# Patient Record
Sex: Female | Born: 1960
Health system: Southern US, Community
[De-identification: ages and names within clinical notes are randomized; demographics above are authoritative.]

## PROBLEM LIST (undated history)

## (undated) DIAGNOSIS — F1910 Other psychoactive substance abuse, uncomplicated: Secondary | ICD-10-CM

## (undated) DIAGNOSIS — F32A Depression, unspecified: Secondary | ICD-10-CM

## (undated) DIAGNOSIS — I10 Essential (primary) hypertension: Secondary | ICD-10-CM

## (undated) DIAGNOSIS — F329 Major depressive disorder, single episode, unspecified: Secondary | ICD-10-CM

## (undated) DIAGNOSIS — F319 Bipolar disorder, unspecified: Secondary | ICD-10-CM

## (undated) DIAGNOSIS — E119 Type 2 diabetes mellitus without complications: Secondary | ICD-10-CM

## (undated) DIAGNOSIS — K759 Inflammatory liver disease, unspecified: Secondary | ICD-10-CM

## (undated) DIAGNOSIS — R7303 Prediabetes: Secondary | ICD-10-CM

## (undated) HISTORY — PX: CATARACT EXTRACTION: SUR2

---

## 1998-04-26 ENCOUNTER — Emergency Department (HOSPITAL_COMMUNITY): Admission: EM | Admit: 1998-04-26 | Discharge: 1998-04-26 | Payer: Self-pay | Admitting: Emergency Medicine

## 1998-09-18 ENCOUNTER — Encounter: Payer: Self-pay | Admitting: Emergency Medicine

## 1998-09-18 ENCOUNTER — Emergency Department (HOSPITAL_COMMUNITY): Admission: EM | Admit: 1998-09-18 | Discharge: 1998-09-18 | Payer: Self-pay | Admitting: Emergency Medicine

## 1999-04-21 ENCOUNTER — Emergency Department (HOSPITAL_COMMUNITY): Admission: EM | Admit: 1999-04-21 | Discharge: 1999-04-21 | Payer: Self-pay | Admitting: Emergency Medicine

## 1999-10-01 ENCOUNTER — Encounter: Payer: Self-pay | Admitting: Emergency Medicine

## 1999-10-01 ENCOUNTER — Emergency Department (HOSPITAL_COMMUNITY): Admission: EM | Admit: 1999-10-01 | Discharge: 1999-10-01 | Payer: Self-pay | Admitting: Emergency Medicine

## 1999-12-20 ENCOUNTER — Emergency Department (HOSPITAL_COMMUNITY): Admission: EM | Admit: 1999-12-20 | Discharge: 1999-12-20 | Payer: Self-pay | Admitting: Emergency Medicine

## 2000-04-11 ENCOUNTER — Emergency Department (HOSPITAL_COMMUNITY): Admission: EM | Admit: 2000-04-11 | Discharge: 2000-04-11 | Payer: Self-pay | Admitting: Emergency Medicine

## 2000-08-01 ENCOUNTER — Encounter: Payer: Self-pay | Admitting: Emergency Medicine

## 2000-08-01 ENCOUNTER — Emergency Department (HOSPITAL_COMMUNITY): Admission: EM | Admit: 2000-08-01 | Discharge: 2000-08-01 | Payer: Self-pay | Admitting: Emergency Medicine

## 2000-11-18 ENCOUNTER — Encounter: Payer: Self-pay | Admitting: Emergency Medicine

## 2000-11-18 ENCOUNTER — Emergency Department (HOSPITAL_COMMUNITY): Admission: EM | Admit: 2000-11-18 | Discharge: 2000-11-18 | Payer: Self-pay

## 2000-11-26 ENCOUNTER — Emergency Department (HOSPITAL_COMMUNITY): Admission: EM | Admit: 2000-11-26 | Discharge: 2000-11-26 | Payer: Self-pay | Admitting: Emergency Medicine

## 2000-12-05 ENCOUNTER — Emergency Department (HOSPITAL_COMMUNITY): Admission: EM | Admit: 2000-12-05 | Discharge: 2000-12-05 | Payer: Self-pay | Admitting: Emergency Medicine

## 2001-08-01 ENCOUNTER — Emergency Department (HOSPITAL_COMMUNITY): Admission: EM | Admit: 2001-08-01 | Discharge: 2001-08-01 | Payer: Self-pay

## 2002-01-02 ENCOUNTER — Emergency Department (HOSPITAL_COMMUNITY): Admission: EM | Admit: 2002-01-02 | Discharge: 2002-01-02 | Payer: Self-pay | Admitting: Emergency Medicine

## 2002-01-02 ENCOUNTER — Encounter: Payer: Self-pay | Admitting: Emergency Medicine

## 2002-02-21 ENCOUNTER — Emergency Department (HOSPITAL_COMMUNITY): Admission: EM | Admit: 2002-02-21 | Discharge: 2002-02-21 | Payer: Self-pay | Admitting: Emergency Medicine

## 2002-03-18 ENCOUNTER — Emergency Department (HOSPITAL_COMMUNITY): Admission: EM | Admit: 2002-03-18 | Discharge: 2002-03-18 | Payer: Self-pay | Admitting: Emergency Medicine

## 2002-06-26 ENCOUNTER — Emergency Department (HOSPITAL_COMMUNITY): Admission: EM | Admit: 2002-06-26 | Discharge: 2002-06-26 | Payer: Self-pay | Admitting: Emergency Medicine

## 2002-09-12 ENCOUNTER — Emergency Department (HOSPITAL_COMMUNITY): Admission: EM | Admit: 2002-09-12 | Discharge: 2002-09-12 | Payer: Self-pay | Admitting: Emergency Medicine

## 2002-09-13 ENCOUNTER — Inpatient Hospital Stay (HOSPITAL_COMMUNITY): Admission: EM | Admit: 2002-09-13 | Discharge: 2002-09-20 | Payer: Self-pay | Admitting: Psychiatry

## 2002-10-01 ENCOUNTER — Emergency Department (HOSPITAL_COMMUNITY): Admission: EM | Admit: 2002-10-01 | Discharge: 2002-10-01 | Payer: Self-pay | Admitting: Emergency Medicine

## 2002-10-01 ENCOUNTER — Encounter: Payer: Self-pay | Admitting: Emergency Medicine

## 2002-12-05 ENCOUNTER — Emergency Department (HOSPITAL_COMMUNITY): Admission: EM | Admit: 2002-12-05 | Discharge: 2002-12-05 | Payer: Self-pay | Admitting: Emergency Medicine

## 2002-12-05 ENCOUNTER — Encounter: Payer: Self-pay | Admitting: Emergency Medicine

## 2003-04-05 ENCOUNTER — Emergency Department (HOSPITAL_COMMUNITY): Admission: EM | Admit: 2003-04-05 | Discharge: 2003-04-05 | Payer: Self-pay | Admitting: Emergency Medicine

## 2003-04-05 ENCOUNTER — Encounter: Payer: Self-pay | Admitting: Emergency Medicine

## 2003-05-29 ENCOUNTER — Emergency Department (HOSPITAL_COMMUNITY): Admission: EM | Admit: 2003-05-29 | Discharge: 2003-05-29 | Payer: Self-pay | Admitting: Emergency Medicine

## 2003-07-05 ENCOUNTER — Emergency Department (HOSPITAL_COMMUNITY): Admission: EM | Admit: 2003-07-05 | Discharge: 2003-07-05 | Payer: Self-pay | Admitting: Emergency Medicine

## 2003-07-14 ENCOUNTER — Emergency Department (HOSPITAL_COMMUNITY): Admission: AD | Admit: 2003-07-14 | Discharge: 2003-07-14 | Payer: Self-pay | Admitting: Emergency Medicine

## 2003-08-02 ENCOUNTER — Encounter: Admission: RE | Admit: 2003-08-02 | Discharge: 2003-08-02 | Payer: Self-pay | Admitting: Internal Medicine

## 2003-08-19 ENCOUNTER — Encounter: Admission: RE | Admit: 2003-08-19 | Discharge: 2003-08-19 | Payer: Self-pay | Admitting: Internal Medicine

## 2003-09-14 ENCOUNTER — Encounter: Admission: RE | Admit: 2003-09-14 | Discharge: 2003-09-14 | Payer: Self-pay | Admitting: Psychiatry

## 2003-09-15 ENCOUNTER — Encounter: Admission: RE | Admit: 2003-09-15 | Discharge: 2003-09-15 | Payer: Self-pay | Admitting: Internal Medicine

## 2003-09-19 ENCOUNTER — Encounter: Admission: RE | Admit: 2003-09-19 | Discharge: 2003-09-19 | Payer: Self-pay | Admitting: Internal Medicine

## 2003-09-19 ENCOUNTER — Other Ambulatory Visit: Admission: RE | Admit: 2003-09-19 | Discharge: 2003-09-19 | Payer: Self-pay | Admitting: Internal Medicine

## 2003-09-26 ENCOUNTER — Encounter: Admission: RE | Admit: 2003-09-26 | Discharge: 2003-09-26 | Payer: Self-pay | Admitting: Internal Medicine

## 2003-10-20 ENCOUNTER — Encounter: Admission: RE | Admit: 2003-10-20 | Discharge: 2003-10-20 | Payer: Self-pay | Admitting: Internal Medicine

## 2003-11-10 ENCOUNTER — Encounter: Admission: RE | Admit: 2003-11-10 | Discharge: 2003-11-10 | Payer: Self-pay | Admitting: Internal Medicine

## 2003-11-13 ENCOUNTER — Emergency Department (HOSPITAL_COMMUNITY): Admission: AD | Admit: 2003-11-13 | Discharge: 2003-11-13 | Payer: Self-pay | Admitting: Family Medicine

## 2003-11-30 ENCOUNTER — Emergency Department (HOSPITAL_COMMUNITY): Admission: AD | Admit: 2003-11-30 | Discharge: 2003-11-30 | Payer: Self-pay | Admitting: Family Medicine

## 2003-12-15 ENCOUNTER — Encounter: Admission: RE | Admit: 2003-12-15 | Discharge: 2003-12-15 | Payer: Self-pay | Admitting: Internal Medicine

## 2003-12-24 ENCOUNTER — Emergency Department (HOSPITAL_COMMUNITY): Admission: AD | Admit: 2003-12-24 | Discharge: 2003-12-24 | Payer: Self-pay | Admitting: Family Medicine

## 2003-12-26 ENCOUNTER — Emergency Department (HOSPITAL_COMMUNITY): Admission: AD | Admit: 2003-12-26 | Discharge: 2003-12-26 | Payer: Self-pay | Admitting: Family Medicine

## 2005-04-05 ENCOUNTER — Ambulatory Visit (HOSPITAL_COMMUNITY): Admission: RE | Admit: 2005-04-05 | Discharge: 2005-04-05 | Payer: Self-pay | Admitting: Obstetrics

## 2005-04-10 ENCOUNTER — Inpatient Hospital Stay (HOSPITAL_COMMUNITY): Admission: RE | Admit: 2005-04-10 | Discharge: 2005-04-13 | Payer: Self-pay | Admitting: Obstetrics

## 2005-04-10 ENCOUNTER — Encounter (INDEPENDENT_AMBULATORY_CARE_PROVIDER_SITE_OTHER): Payer: Self-pay | Admitting: *Deleted

## 2005-05-22 ENCOUNTER — Emergency Department (HOSPITAL_COMMUNITY): Admission: EM | Admit: 2005-05-22 | Discharge: 2005-05-22 | Payer: Self-pay | Admitting: Emergency Medicine

## 2005-06-03 ENCOUNTER — Emergency Department (HOSPITAL_COMMUNITY): Admission: EM | Admit: 2005-06-03 | Discharge: 2005-06-03 | Payer: Self-pay | Admitting: Emergency Medicine

## 2005-07-26 ENCOUNTER — Emergency Department (HOSPITAL_COMMUNITY): Admission: EM | Admit: 2005-07-26 | Discharge: 2005-07-26 | Payer: Self-pay | Admitting: Family Medicine

## 2005-10-08 ENCOUNTER — Ambulatory Visit: Payer: Self-pay | Admitting: Internal Medicine

## 2006-01-08 ENCOUNTER — Ambulatory Visit: Payer: Self-pay | Admitting: Internal Medicine

## 2006-01-15 ENCOUNTER — Ambulatory Visit: Payer: Self-pay | Admitting: Internal Medicine

## 2006-02-04 ENCOUNTER — Ambulatory Visit: Payer: Self-pay | Admitting: Internal Medicine

## 2006-04-04 ENCOUNTER — Ambulatory Visit: Payer: Self-pay | Admitting: Internal Medicine

## 2006-04-09 ENCOUNTER — Encounter: Payer: Self-pay | Admitting: Emergency Medicine

## 2006-05-02 ENCOUNTER — Ambulatory Visit: Payer: Self-pay | Admitting: Internal Medicine

## 2006-06-04 ENCOUNTER — Encounter (INDEPENDENT_AMBULATORY_CARE_PROVIDER_SITE_OTHER): Payer: Self-pay | Admitting: Unknown Physician Specialty

## 2006-06-04 ENCOUNTER — Encounter: Admission: RE | Admit: 2006-06-04 | Discharge: 2006-06-04 | Payer: Self-pay | Admitting: Sports Medicine

## 2006-08-04 ENCOUNTER — Encounter (INDEPENDENT_AMBULATORY_CARE_PROVIDER_SITE_OTHER): Payer: Self-pay | Admitting: Internal Medicine

## 2006-08-06 ENCOUNTER — Encounter: Admission: RE | Admit: 2006-08-06 | Discharge: 2006-08-06 | Payer: Self-pay | Admitting: Internal Medicine

## 2006-09-16 ENCOUNTER — Ambulatory Visit: Payer: Self-pay | Admitting: Internal Medicine

## 2006-09-16 ENCOUNTER — Encounter (INDEPENDENT_AMBULATORY_CARE_PROVIDER_SITE_OTHER): Payer: Self-pay | Admitting: Internal Medicine

## 2006-10-03 DIAGNOSIS — I10 Essential (primary) hypertension: Secondary | ICD-10-CM | POA: Insufficient documentation

## 2006-10-03 DIAGNOSIS — L509 Urticaria, unspecified: Secondary | ICD-10-CM | POA: Insufficient documentation

## 2006-10-03 DIAGNOSIS — F319 Bipolar disorder, unspecified: Secondary | ICD-10-CM

## 2006-10-03 DIAGNOSIS — L309 Dermatitis, unspecified: Secondary | ICD-10-CM | POA: Insufficient documentation

## 2006-10-03 DIAGNOSIS — J42 Unspecified chronic bronchitis: Secondary | ICD-10-CM | POA: Insufficient documentation

## 2006-10-03 DIAGNOSIS — K5900 Constipation, unspecified: Secondary | ICD-10-CM

## 2006-10-03 DIAGNOSIS — M25519 Pain in unspecified shoulder: Secondary | ICD-10-CM

## 2006-10-25 ENCOUNTER — Emergency Department (HOSPITAL_COMMUNITY): Admission: EM | Admit: 2006-10-25 | Discharge: 2006-10-26 | Payer: Self-pay | Admitting: Emergency Medicine

## 2007-01-28 ENCOUNTER — Telehealth: Payer: Self-pay | Admitting: *Deleted

## 2007-02-02 ENCOUNTER — Ambulatory Visit: Payer: Self-pay | Admitting: Hospitalist

## 2007-02-02 ENCOUNTER — Encounter: Payer: Self-pay | Admitting: Licensed Clinical Social Worker

## 2007-02-02 ENCOUNTER — Encounter (INDEPENDENT_AMBULATORY_CARE_PROVIDER_SITE_OTHER): Payer: Self-pay | Admitting: *Deleted

## 2007-02-02 LAB — CONVERTED CEMR LAB
BUN: 17 mg/dL (ref 6–23)
CO2: 25 meq/L (ref 19–32)
Calcium: 9.5 mg/dL (ref 8.4–10.5)
Chloride: 107 meq/L (ref 96–112)
Creatinine, Ser: 0.85 mg/dL (ref 0.40–1.20)
Glucose, Bld: 94 mg/dL (ref 70–99)
Potassium: 4.3 meq/L (ref 3.5–5.3)
Sodium: 140 meq/L (ref 135–145)

## 2007-02-04 ENCOUNTER — Telehealth: Payer: Self-pay | Admitting: *Deleted

## 2007-02-17 ENCOUNTER — Encounter: Payer: Self-pay | Admitting: Licensed Clinical Social Worker

## 2007-04-24 ENCOUNTER — Telehealth (INDEPENDENT_AMBULATORY_CARE_PROVIDER_SITE_OTHER): Payer: Self-pay | Admitting: *Deleted

## 2007-05-12 ENCOUNTER — Encounter (INDEPENDENT_AMBULATORY_CARE_PROVIDER_SITE_OTHER): Payer: Self-pay | Admitting: Internal Medicine

## 2007-06-11 ENCOUNTER — Emergency Department (HOSPITAL_COMMUNITY): Admission: EM | Admit: 2007-06-11 | Discharge: 2007-06-11 | Payer: Self-pay | Admitting: Emergency Medicine

## 2007-06-24 ENCOUNTER — Ambulatory Visit: Payer: Self-pay | Admitting: Internal Medicine

## 2007-06-24 ENCOUNTER — Encounter (INDEPENDENT_AMBULATORY_CARE_PROVIDER_SITE_OTHER): Payer: Self-pay | Admitting: Internal Medicine

## 2007-06-24 DIAGNOSIS — H269 Unspecified cataract: Secondary | ICD-10-CM

## 2007-07-01 ENCOUNTER — Encounter: Admission: RE | Admit: 2007-07-01 | Discharge: 2007-07-01 | Payer: Self-pay | Admitting: Sports Medicine

## 2007-07-09 ENCOUNTER — Encounter (INDEPENDENT_AMBULATORY_CARE_PROVIDER_SITE_OTHER): Payer: Self-pay | Admitting: Internal Medicine

## 2007-07-09 ENCOUNTER — Encounter: Payer: Self-pay | Admitting: Licensed Clinical Social Worker

## 2007-07-09 ENCOUNTER — Ambulatory Visit: Payer: Self-pay | Admitting: *Deleted

## 2007-07-09 LAB — CONVERTED CEMR LAB
ALT: 8 units/L (ref 0–35)
AST: 14 units/L (ref 0–37)
Albumin: 4.6 g/dL (ref 3.5–5.2)
Alkaline Phosphatase: 61 units/L (ref 39–117)
BUN: 11 mg/dL (ref 6–23)
Basophils Absolute: 0 10*3/uL (ref 0.0–0.1)
Basophils Relative: 1 % (ref 0–1)
CO2: 27 meq/L (ref 19–32)
Calcium: 10.1 mg/dL (ref 8.4–10.5)
Chloride: 104 meq/L (ref 96–112)
Creatinine, Ser: 0.9 mg/dL (ref 0.40–1.20)
Eosinophils Absolute: 0.1 10*3/uL (ref 0.0–0.7)
Eosinophils Relative: 1 % (ref 0–5)
Glucose, Bld: 96 mg/dL (ref 70–99)
HCT: 39.2 % (ref 36.0–46.0)
Hemoglobin: 13 g/dL (ref 12.0–15.0)
Lymphocytes Relative: 39 % (ref 12–46)
Lymphs Abs: 2.4 10*3/uL (ref 0.7–3.3)
MCHC: 33.2 g/dL (ref 30.0–36.0)
MCV: 87.3 fL (ref 78.0–100.0)
Monocytes Absolute: 0.2 10*3/uL (ref 0.2–0.7)
Monocytes Relative: 4 % (ref 3–11)
Neutro Abs: 3.4 10*3/uL (ref 1.7–7.7)
Neutrophils Relative %: 56 % (ref 43–77)
Platelets: 260 10*3/uL (ref 150–400)
Potassium: 3.9 meq/L (ref 3.5–5.3)
RBC: 4.49 M/uL (ref 3.87–5.11)
RDW: 14.8 % — ABNORMAL HIGH (ref 11.5–14.0)
Sodium: 138 meq/L (ref 135–145)
Total Bilirubin: 0.5 mg/dL (ref 0.3–1.2)
Total Protein: 7.9 g/dL (ref 6.0–8.3)
WBC: 6.2 10*3/uL (ref 4.0–10.5)

## 2007-07-27 ENCOUNTER — Encounter: Payer: Self-pay | Admitting: Licensed Clinical Social Worker

## 2007-08-26 ENCOUNTER — Ambulatory Visit: Payer: Self-pay | Admitting: Internal Medicine

## 2007-08-26 ENCOUNTER — Encounter (INDEPENDENT_AMBULATORY_CARE_PROVIDER_SITE_OTHER): Payer: Self-pay | Admitting: Internal Medicine

## 2007-08-26 DIAGNOSIS — M6281 Muscle weakness (generalized): Secondary | ICD-10-CM

## 2007-08-27 LAB — CONVERTED CEMR LAB
BUN: 16 mg/dL (ref 6–23)
CO2: 22 meq/L (ref 19–32)
Calcium: 9.4 mg/dL (ref 8.4–10.5)
Chloride: 107 meq/L (ref 96–112)
Creatinine, Ser: 0.88 mg/dL (ref 0.40–1.20)
Glucose, Bld: 93 mg/dL (ref 70–99)
Magnesium: 2.1 mg/dL (ref 1.5–2.5)
Potassium: 3.8 meq/L (ref 3.5–5.3)
Sodium: 141 meq/L (ref 135–145)
TSH: 0.727 microintl units/mL (ref 0.350–5.50)

## 2007-09-17 ENCOUNTER — Ambulatory Visit: Payer: Self-pay | Admitting: *Deleted

## 2007-09-17 ENCOUNTER — Encounter (INDEPENDENT_AMBULATORY_CARE_PROVIDER_SITE_OTHER): Payer: Self-pay | Admitting: Infectious Diseases

## 2007-09-17 DIAGNOSIS — M25569 Pain in unspecified knee: Secondary | ICD-10-CM

## 2007-09-17 LAB — CONVERTED CEMR LAB
BUN: 13 mg/dL (ref 6–23)
CO2: 22 meq/L (ref 19–32)
Calcium: 10 mg/dL (ref 8.4–10.5)
Chloride: 105 meq/L (ref 96–112)
Creatinine, Ser: 0.9 mg/dL (ref 0.40–1.20)
Glucose, Bld: 93 mg/dL (ref 70–99)
Potassium: 4 meq/L (ref 3.5–5.3)
Rheumatoid fact SerPl-aCnc: <20 intl units/mL (ref 0–20)
Sodium: 139 meq/L (ref 135–145)
Total CK: 104 units/L (ref 7–177)

## 2007-09-18 ENCOUNTER — Telehealth: Payer: Self-pay | Admitting: *Deleted

## 2007-09-21 ENCOUNTER — Encounter (INDEPENDENT_AMBULATORY_CARE_PROVIDER_SITE_OTHER): Payer: Self-pay | Admitting: Internal Medicine

## 2007-09-28 ENCOUNTER — Ambulatory Visit (HOSPITAL_COMMUNITY): Admission: RE | Admit: 2007-09-28 | Discharge: 2007-09-28 | Payer: Self-pay | Admitting: Infectious Diseases

## 2007-09-28 ENCOUNTER — Encounter (INDEPENDENT_AMBULATORY_CARE_PROVIDER_SITE_OTHER): Payer: Self-pay | Admitting: Internal Medicine

## 2007-09-28 ENCOUNTER — Ambulatory Visit: Payer: Self-pay | Admitting: *Deleted

## 2007-10-07 ENCOUNTER — Encounter (INDEPENDENT_AMBULATORY_CARE_PROVIDER_SITE_OTHER): Payer: Self-pay | Admitting: Internal Medicine

## 2007-10-16 ENCOUNTER — Encounter: Payer: Self-pay | Admitting: Licensed Clinical Social Worker

## 2007-12-30 ENCOUNTER — Telehealth (INDEPENDENT_AMBULATORY_CARE_PROVIDER_SITE_OTHER): Payer: Self-pay | Admitting: Internal Medicine

## 2008-01-21 ENCOUNTER — Telehealth (INDEPENDENT_AMBULATORY_CARE_PROVIDER_SITE_OTHER): Payer: Self-pay | Admitting: Internal Medicine

## 2008-01-21 ENCOUNTER — Telehealth: Payer: Self-pay | Admitting: *Deleted

## 2008-01-21 ENCOUNTER — Encounter (INDEPENDENT_AMBULATORY_CARE_PROVIDER_SITE_OTHER): Payer: Self-pay | Admitting: Internal Medicine

## 2008-07-27 ENCOUNTER — Emergency Department (HOSPITAL_COMMUNITY): Admission: EM | Admit: 2008-07-27 | Discharge: 2008-07-27 | Payer: Self-pay | Admitting: Emergency Medicine

## 2008-08-29 ENCOUNTER — Emergency Department (HOSPITAL_COMMUNITY): Admission: EM | Admit: 2008-08-29 | Discharge: 2008-08-29 | Payer: Self-pay | Admitting: Emergency Medicine

## 2008-08-29 ENCOUNTER — Encounter (INDEPENDENT_AMBULATORY_CARE_PROVIDER_SITE_OTHER): Payer: Self-pay | Admitting: Internal Medicine

## 2008-08-29 ENCOUNTER — Telehealth (INDEPENDENT_AMBULATORY_CARE_PROVIDER_SITE_OTHER): Payer: Self-pay | Admitting: Internal Medicine

## 2008-08-29 ENCOUNTER — Ambulatory Visit: Payer: Self-pay | Admitting: Internal Medicine

## 2008-08-29 LAB — CONVERTED CEMR LAB

## 2008-09-01 ENCOUNTER — Ambulatory Visit: Payer: Self-pay | Admitting: Internal Medicine

## 2008-09-21 ENCOUNTER — Telehealth (INDEPENDENT_AMBULATORY_CARE_PROVIDER_SITE_OTHER): Payer: Self-pay | Admitting: Internal Medicine

## 2008-09-23 ENCOUNTER — Telehealth (INDEPENDENT_AMBULATORY_CARE_PROVIDER_SITE_OTHER): Payer: Self-pay | Admitting: Internal Medicine

## 2008-09-28 ENCOUNTER — Encounter (INDEPENDENT_AMBULATORY_CARE_PROVIDER_SITE_OTHER): Payer: Self-pay | Admitting: Internal Medicine

## 2008-09-28 ENCOUNTER — Ambulatory Visit: Payer: Self-pay | Admitting: Internal Medicine

## 2008-12-02 ENCOUNTER — Emergency Department (HOSPITAL_COMMUNITY): Admission: EM | Admit: 2008-12-02 | Discharge: 2008-12-02 | Payer: Self-pay | Admitting: Emergency Medicine

## 2009-01-03 ENCOUNTER — Encounter (INDEPENDENT_AMBULATORY_CARE_PROVIDER_SITE_OTHER): Payer: Self-pay | Admitting: Internal Medicine

## 2009-01-03 ENCOUNTER — Emergency Department (HOSPITAL_COMMUNITY): Admission: EM | Admit: 2009-01-03 | Discharge: 2009-01-03 | Payer: Self-pay | Admitting: Emergency Medicine

## 2009-03-31 ENCOUNTER — Encounter: Payer: Self-pay | Admitting: Internal Medicine

## 2009-07-19 ENCOUNTER — Emergency Department (HOSPITAL_COMMUNITY): Admission: EM | Admit: 2009-07-19 | Discharge: 2009-07-19 | Payer: Self-pay | Admitting: Emergency Medicine

## 2009-08-05 ENCOUNTER — Emergency Department (HOSPITAL_COMMUNITY): Admission: EM | Admit: 2009-08-05 | Discharge: 2009-08-05 | Payer: Self-pay | Admitting: Emergency Medicine

## 2009-10-10 ENCOUNTER — Telehealth: Payer: Self-pay | Admitting: Internal Medicine

## 2009-10-12 ENCOUNTER — Inpatient Hospital Stay (HOSPITAL_COMMUNITY): Admission: AD | Admit: 2009-10-12 | Discharge: 2009-10-16 | Payer: Self-pay | Admitting: Psychiatry

## 2009-10-12 ENCOUNTER — Other Ambulatory Visit: Payer: Self-pay | Admitting: Emergency Medicine

## 2009-10-12 ENCOUNTER — Ambulatory Visit: Payer: Self-pay | Admitting: Psychiatry

## 2009-12-24 ENCOUNTER — Emergency Department (HOSPITAL_COMMUNITY): Admission: EM | Admit: 2009-12-24 | Discharge: 2009-12-24 | Payer: Self-pay | Admitting: Emergency Medicine

## 2010-01-02 ENCOUNTER — Emergency Department (HOSPITAL_COMMUNITY): Admission: EM | Admit: 2010-01-02 | Discharge: 2010-01-02 | Payer: Self-pay | Admitting: Emergency Medicine

## 2010-02-03 ENCOUNTER — Emergency Department (HOSPITAL_COMMUNITY): Admission: EM | Admit: 2010-02-03 | Discharge: 2010-02-03 | Payer: Self-pay | Admitting: Emergency Medicine

## 2010-03-02 ENCOUNTER — Encounter: Payer: Self-pay | Admitting: Internal Medicine

## 2010-04-11 ENCOUNTER — Emergency Department (HOSPITAL_COMMUNITY): Admission: EM | Admit: 2010-04-11 | Discharge: 2010-04-11 | Payer: Self-pay | Admitting: Emergency Medicine

## 2010-09-29 ENCOUNTER — Emergency Department (HOSPITAL_COMMUNITY)
Admission: EM | Admit: 2010-09-29 | Discharge: 2010-09-29 | Payer: Self-pay | Source: Home / Self Care | Admitting: Emergency Medicine

## 2010-11-29 ENCOUNTER — Emergency Department (HOSPITAL_COMMUNITY)
Admission: EM | Admit: 2010-11-29 | Discharge: 2010-11-29 | Payer: Self-pay | Source: Home / Self Care | Admitting: Emergency Medicine

## 2011-01-08 NOTE — Letter (Signed)
Summary: MAMMOGRAM   MAMMOGRAM   Imported By: Margie Billet 03/06/2010 10:13:44  _____________________________________________________________________  External Attachment:    Type:   Image     Comment:   External Document  Appended Document: MAMMOGRAM     Clinical Lists Changes  Observations: Added new observation of MAMMO DUE: 07/2008 (03/06/2010 13:25) Added new observation of MAMMOGRAM: No specific mammographic evidence of malignancy.   (07/01/2007 13:26)       Mammogram  Procedure date:  07/01/2007  Findings:      No specific mammographic evidence of malignancy.    Procedures Next Due Date:    Mammogram: 07/2008   Mammogram  Procedure date:  07/01/2007  Findings:      No specific mammographic evidence of malignancy.    Procedures Next Due Date:    Mammogram: 07/2008

## 2011-01-31 ENCOUNTER — Emergency Department (HOSPITAL_COMMUNITY): Payer: Medicaid Other

## 2011-01-31 ENCOUNTER — Emergency Department (HOSPITAL_COMMUNITY)
Admission: EM | Admit: 2011-01-31 | Discharge: 2011-01-31 | Disposition: A | Discharge status: Home / Self Care | Payer: Medicaid Other | Attending: Emergency Medicine | Admitting: Emergency Medicine

## 2011-01-31 DIAGNOSIS — M25519 Pain in unspecified shoulder: Secondary | ICD-10-CM | POA: Insufficient documentation

## 2011-01-31 DIAGNOSIS — Z9889 Other specified postprocedural states: Secondary | ICD-10-CM | POA: Insufficient documentation

## 2011-01-31 DIAGNOSIS — F3130 Bipolar disorder, current episode depressed, mild or moderate severity, unspecified: Secondary | ICD-10-CM | POA: Insufficient documentation

## 2011-01-31 DIAGNOSIS — M129 Arthropathy, unspecified: Secondary | ICD-10-CM | POA: Insufficient documentation

## 2011-01-31 DIAGNOSIS — Z79899 Other long term (current) drug therapy: Secondary | ICD-10-CM | POA: Insufficient documentation

## 2011-01-31 DIAGNOSIS — I10 Essential (primary) hypertension: Secondary | ICD-10-CM | POA: Insufficient documentation

## 2011-01-31 DIAGNOSIS — Y929 Unspecified place or not applicable: Secondary | ICD-10-CM | POA: Insufficient documentation

## 2011-01-31 DIAGNOSIS — IMO0002 Reserved for concepts with insufficient information to code with codable children: Secondary | ICD-10-CM | POA: Insufficient documentation

## 2011-03-13 LAB — URINALYSIS, ROUTINE W REFLEX MICROSCOPIC
Hgb urine dipstick: NEGATIVE
Nitrite: NEGATIVE
Specific Gravity, Urine: 1.035 — ABNORMAL HIGH (ref 1.005–1.030)
pH: 5.5 (ref 5.0–8.0)

## 2011-03-13 LAB — CBC
Hemoglobin: 12.9 g/dL (ref 12.0–15.0)
RBC: 4.4 MIL/uL (ref 3.87–5.11)

## 2011-03-13 LAB — RAPID URINE DRUG SCREEN, HOSP PERFORMED
Opiates: NOT DETECTED
Tetrahydrocannabinol: NOT DETECTED

## 2011-03-13 LAB — DIFFERENTIAL
Lymphocytes Relative: 23 % (ref 12–46)
Monocytes Absolute: 0.6 10*3/uL (ref 0.1–1.0)
Monocytes Relative: 6 % (ref 3–12)
Neutro Abs: 6.8 10*3/uL (ref 1.7–7.7)

## 2011-03-13 LAB — BASIC METABOLIC PANEL
Calcium: 10.2 mg/dL (ref 8.4–10.5)
GFR calc Af Amer: >60 mL/min (ref >60)
GFR calc non Af Amer: 57 mL/min — ABNORMAL LOW (ref >60)
Sodium: 140 mEq/L (ref 135–145)

## 2011-03-13 LAB — RPR: RPR Ser Ql: NONREACTIVE

## 2011-03-13 LAB — PREGNANCY, URINE: Preg Test, Ur: NEGATIVE

## 2011-03-13 LAB — URINE MICROSCOPIC-ADD ON

## 2011-03-16 LAB — POTASSIUM: Potassium: 3.6 mEq/L (ref 3.5–5.1)

## 2011-03-16 LAB — RAPID URINE DRUG SCREEN, HOSP PERFORMED
Amphetamines: NOT DETECTED
Barbiturates: NOT DETECTED
Benzodiazepines: NOT DETECTED

## 2011-03-16 LAB — POCT I-STAT, CHEM 8
Chloride: 105 mEq/L (ref 96–112)
Creatinine, Ser: 0.9 mg/dL (ref 0.4–1.2)
Glucose, Bld: 100 mg/dL — ABNORMAL HIGH (ref 70–99)
HCT: 43 % (ref 36.0–46.0)
Potassium: 6.7 mEq/L (ref 3.5–5.1)
Sodium: 136 mEq/L (ref 135–145)

## 2011-03-16 LAB — POCT CARDIAC MARKERS
CKMB, poc: <1 ng/mL — ABNORMAL LOW (ref 1.0–8.0)
CKMB, poc: <1 ng/mL — ABNORMAL LOW (ref 1.0–8.0)
Myoglobin, poc: 51.1 ng/mL (ref 12–200)
Myoglobin, poc: 52.9 ng/mL (ref 12–200)

## 2011-03-16 LAB — ETHANOL: Alcohol, Ethyl (B): <5 mg/dL (ref 0–10)

## 2011-03-17 LAB — POCT I-STAT, CHEM 8
Creatinine, Ser: 0.8 mg/dL (ref 0.4–1.2)
Glucose, Bld: 88 mg/dL (ref 70–99)
HCT: 40 % (ref 36.0–46.0)
Hemoglobin: 13.6 g/dL (ref 12.0–15.0)
Potassium: 3.7 mEq/L (ref 3.5–5.1)
TCO2: 26 mmol/L (ref 0–100)

## 2011-03-17 LAB — POCT CARDIAC MARKERS: CKMB, poc: <1 ng/mL — ABNORMAL LOW (ref 1.0–8.0)

## 2011-04-26 NOTE — H&P (Signed)
NAME:  Rhonda Dominguez, Rhonda Dominguez                       ACCOUNT NO.:  [id number]   MEDICAL RECORD NO.:  [id number]                   PATIENT TYPE:  IPS   LOCATION:  0504                                 [facility]ACILITY[facility]  [facility]   PHYSICIAN:  Geoffery Lyons, M.D.                   DATE OF BIRTH:  May 19, 1961   DATE OF ADMISSION:  09/13/2002  DATE OF DISCHARGE:                         PSYCHIATRIC ADMISSION ASSESSMENT   IDENTIFYING INFORMATION:  This is a 50 year old single African-American  female voluntarily admitted on September 13, 2002.   HISTORY OF PRESENT ILLNESS:  The patient presents with history of depression  and suicidal thoughts.  The patient states she tried to stab herself but she  was stopped by her sister.  She feels very depressed and has been for the  past two weeks.  She would not disclose over what.  Reports positive  auditory hallucinations, hearing bad things.  Positive visual  hallucinations, seeing shadows.  Her sleep has been decreased.  She had a  decreased appetite with a 10-pound weight loss.  Denies any paranoia.  She  reported that she wants to get started on Ritalin, that she has been on in  the past.  She states she has had a problem with drug abuse but has been off  cocaine for the two months.  The patient denies any consistent alcohol use.   PAST PSYCHIATRIC HISTORY:  First hospitalization to [facility].  No other inpatient admissions.  No outpatient treatment.  No  history of a suicide attempt.   SOCIAL HISTORY:  This is a 50 year old single African-American female with  two children, ages 29 and 58.  She lives with her mother.  She is applying  for disability.  No legal problems.  She has completed the 11th grade.   FAMILY HISTORY:  None.   ALCOHOL/DRUG HISTORY:  The patient smokes.  She drinks beer, mostly by the  quart per week.  She states she has been off any drugs for the past two  months and has been using cocaine in the past.   PRIMARY CARE  PHYSICIAN:  None.   MEDICAL PROBLEMS:  Chronic bronchitis and vaginal bleeding.   MEDICATIONS:  None.   ALLERGIES:  None.   PHYSICAL EXAMINATION:  Performed at [facility] Emergency Department.   LABORATORY DATA:  Urine pregnancy test was negative.  CBC with hemoglobin  10.9, hematocrit 33.5, RDW elevated at 14.9, albumin 3.2.   MENTAL STATUS EXAM:  She is an alert, young, middle-aged African-American  female.  She has poor eye contact.  Wearing hospital-wear.  Speech is clear.  Mood is anxious.  Affect is irritable.  Thought processes are coherent.  The  patient was endorsing positive auditory and visual hallucinations.  Does not  appear to be responding to internal stimuli at this time.  Cognitive  function intact.  Memory is fair.  Judgment and insight are fair.  DIAGNOSES:   AXIS I:  1. Major depressive disorder with psychosis.  2. Cocaine abuse.   AXIS II:  Deferred.   AXIS III:  1. Chronic bronchitis.  2. Trichomonas.  3. Pelvic inflammatory disease.  4. Anemia.   AXIS IV:  Problems with social environment, other psychosocial problems,  medical problems.   AXIS V:  Current 30; this past year 58.   PLAN:  Voluntary admission for suicidal ideation and psychosis.  Contract  for safety.  Check every 15 minutes.  Will initiate an antidepressant to  stabilize mood and thinking so patient and others can be safe.  Will  initiate Zyprexa for psychosis.  Follow up with mental health.  The patient  to be medication-compliant.  Medication compliance was discussed with  patient.  The patient to remain alcohol and drug-free.   TENTATIVE LENGTH OF STAY:  Three to five days.      Landry Corporal, N.P.                       Geoffery Lyons, M.D.    JO/MEDQ  D:  09/15/2002  T:  09/15/2002  Job:  [id number]

## 2011-04-26 NOTE — Op Note (Signed)
[name], Rhonda Dominguez             ACCOUNT NO.:  [id number]   MEDICAL RECORD NO.:  [id number]          PATIENT TYPE:  INP   LOCATION:  9399                          FACILITY:  [facility]   PHYSICIAN:  Kathreen Cosier, M.D.DATE OF BIRTH:  1961-02-15   DATE OF PROCEDURE:  04/10/2005  DATE OF DISCHARGE:                                 OPERATIVE REPORT   PREOPERATIVE DIAGNOSIS:  Severe dysmenorrhea and dysfunctional uterine  bleeding.   POSTOPERATIVE DIAGNOSIS:  Severe dysmenorrhea and dysfunctional uterine  bleeding.   OPERATION PERFORMED:  Total abdominal hysterectomy, bilateral salpingectomy.   SURGEON:  Kathreen Cosier, M.D.   ASSISTANT:  Bing Neighbors. Clearance Coots, M.D.   ANESTHESIA:  General.   DESCRIPTION OF PROCEDURE:  Patient placed in supine position.  General  anesthesia administered.  Abdomen prepped and draped and bladder emptied  with a Foley catheter.  Transverse suprapubic incision made, carried down to  the rectus fascia.  Fascia cleaned and incised the length of the incision.  Rectus muscles were retracted laterally.  Peritoneum was incised  longitudinally.  Uterus was enlarged and boggy.  The right round ligament  was grasped with a Kelly clamp, cut and suture ligated with #1 chromic.  Procedure down in similar fashion to the other side.  Using Metzenbaum  scissors, a bladder flap was developed and the bladder dissected off of the  cervix.  The right utero-ovarian ligament was clamped, cut and suture  ligated with #1 chromic bilaterally.  Then the uterine vessels skeletonized  bilaterally, double clamped with Heaney clamp on the right, cut, suture  ligated with #1 chromic.  Procedure done in similar fashion the other side.  The uterosacral ligament and cardinal ligaments grasped with straight Kocher  clamps, suture ligated with #1 chromic bilaterally. Specimens __________  uterus and tubes removed at cervicovaginal junction with Mayo scissors.  Modified Richardson  sutures placed in the angles of the vagina.  Then the  vaginal walls run with interlocking sutures of #1 chromic.  Hemostasis was  satisfactory.  Blood loss 200 cc.  Operative site reperitonealized with 2-0  chromic.  Lap and sponge counts correct.  Abdomen closed in layers.  Peritoneum with continuous suture of 0 chromic.  Fascia continuous suture of  0 Dexon and the skin closed with subcuticular stitch of 4-0 Monocryl.  The  patient tolerated the procedure well, taken to recovery room in good  condition.      BAM/MEDQ  D:  04/10/2005  T:  04/10/2005  Job:  [id number]

## 2011-04-26 NOTE — Discharge Summary (Signed)
NAME:  MADASON, RAULS                       ACCOUNT NO.:  [id number]   MEDICAL RECORD NO.:  [id number]                   PATIENT TYPE:  IPS   LOCATION:  0504                                 [facility]ILITY[facility]  [facility]   PHYSICIAN:  Geoffery Lyons, M.D.                   DATE OF BIRTH:  01-31-1961   DATE OF ADMISSION:  09/13/2002  DATE OF DISCHARGE:  09/20/2002                                 DISCHARGE SUMMARY   CHIEF COMPLAINT AND PRESENTING ILLNESS:  This was first admission  to [facility]  for this 50 year old single African-American female,  voluntarily admitted.  History of depression and suicidal thoughts.  She  tried to stab herself but she was stopped by her sister.  Feels very  depressed, has been for the past 2 weeks, she could not disclose however  why.  Reports positive auditory hallucinations, hearing bad things, reports  visual hallucinations seeing shadows.  Decreased sleep, decreased appetite,  weight loss.  She wants to be started on Ritalin that she has been on in the  past.  Problem with drug abuse.  Has been off cocaine for 2 months.   PAST PSYCHIATRIC HISTORY:  First time [facility], no other  inpatient treatment.   ALCOHOL AND DRUG HISTORY:  Smokes, drinks beer, off drugs for the past 2  months, has used cocaine in the past.   PAST MEDICAL HISTORY:  Chronic bronchitis, vaginal bleeding.   PHYSICAL EXAMINATION:  Performed, failed to show any acute findings.   MENTAL STATUS EXAM:  Reveals an alert, young middle-aged, African-American  female, poor eye contact, wearing hospital wear.  Speech is clear, mood is  anxious, affect is irritable.  Thought processes are coherent, reports  positive auditory and visual hallucinations, does not appear to be  responding to internal stimuli.  Cognition well preserved.   ADMISSION DIAGNOSES:   AXIS I:  1. Major depression, with psychotic features.  2. Cocaine abuse.   AXIS II:  No diagnosis.   AXIS III:  Chronic bronchitis, Trichomonas, __________ anemia.   AXIS IV:  Moderate.   AXIS V:  Global assessment of function upon admission 30, highest global  assessment of function in past year 70.   LABORATORY DATA:  CBC:  Hemoglobin 10.9.  Blood chemistries were within  normal limits.  Thyroid profile was within normal limits.  Drug screen  positive for cocaine.   COURSE IN HOSPITAL:  She was admitted and started on intensive individual  and group psychotherapy.  We went ahead and started Zyprexa 5 at bedtime.  She was given Zoloft 50 mg per day.  Zyprexa was increased to 2.5 in the  morning and then 7.5 at night, and then 2.5 in the morning and 10 at night.  Zoloft was increased to 75.  Zyprexa was increased to 2.5 in the morning and  15 at night, and Zoloft was decreased back  to 50.  As the hospitalization  progressed, initially not sleeping well, very sensitive, guarded in terms of  what was going on.  Irritable, reluctant to leave the bed, feeling that she  is not sleeping well, continuing to be guarded and hostile. Slowly started  improving, started sleeping better, unable to discuss the issues, denied any  cravings, no problems with perception.  By October 13 she seemed to be back  to her baseline, was __________ but says that is the way she is.  Denied any  suicidal ideas, no homicidal ideas, willing and motivated to pursue further  outpatient treatment, increased insight with the need to abstain.  Will be  going to [facility].   DISCHARGE DIAGNOSES:   AXIS I:  1. Major depression with psychosis.  2. Cocaine dependence.   AXIS II:  No diagnosis.   AXIS III:  Chronic bronchitis, anemia, trichomonas, pelvic inflammatory  disease.   AXIS IV:  Moderate.   AXIS V:  Global assessment of function upon discharge 55.   DISCHARGE MEDICATIONS:  1. Vibramycin 100 twice a day.  2. Symmetrel 100 twice a day.  3. Zyprexa 2.5 in the morning, 15 at night.  4. Depakote ER  500 at bedtime.  5. Zoloft 25 mg daily.  6. Ambien 10 at bedtime for sleep.   DISPOSITION:                                                Geoffery Lyons, M.D.    IL/MEDQ  D:  10/20/2002  T:  10/22/2002  Job:  [id number]

## 2011-04-26 NOTE — Discharge Summary (Signed)
[name], [name]             ACCOUNT NO.:  [id number]   MEDICAL RECORD NO.:  [id number]          PATIENT TYPE:  INP   LOCATION:  9318                          FACILITY:  [facility]   PHYSICIAN:  Kathreen Cosier, M.D.DATE OF BIRTH:  1961/09/15   DATE OF ADMISSION:  04/10/2005  DATE OF DISCHARGE:  04/13/2005                                 DISCHARGE SUMMARY   The patient is a 50 year old gravida 2, para 2-0-2 with 2 year history of  heavy vaginal bleeding, severe dysmenorrhea.  The patient was admitted for  definitive therapy.   On admission, her sodium 141, potassium 3.9, chloride 101, CO2 28, glucose  89, BUN 10, creatinine 1, PT/PTT normal.  Urinalysis was negative.  Hemoglobin was 12.7, white count 8.6, platelets 331.  She underwent TAH and  bilateral salpingectomy on Apr 10, 2005.  Her hemoglobin postoperatively was  9.6.  The patient did well and was discharged home on the third  postoperative day, ambulatory on a regular diet, Tylox for pain, to see me  in 4 weeks.   DISCHARGE DIAGNOSIS:  Status post total abdominal hysterectomy and bilateral  salpingectomy.      BAM/MEDQ  D:  04/13/2005  T:  04/13/2005  Job:  [id number]

## 2011-05-21 ENCOUNTER — Emergency Department (HOSPITAL_COMMUNITY)
Admission: EM | Admit: 2011-05-21 | Discharge: 2011-05-21 | Disposition: A | Discharge status: Home / Self Care | Payer: Medicaid Other | Attending: Emergency Medicine | Admitting: Emergency Medicine

## 2011-05-21 DIAGNOSIS — F17200 Nicotine dependence, unspecified, uncomplicated: Secondary | ICD-10-CM | POA: Insufficient documentation

## 2011-05-21 DIAGNOSIS — I10 Essential (primary) hypertension: Secondary | ICD-10-CM | POA: Insufficient documentation

## 2011-05-21 DIAGNOSIS — M129 Arthropathy, unspecified: Secondary | ICD-10-CM | POA: Insufficient documentation

## 2011-05-21 DIAGNOSIS — F1410 Cocaine abuse, uncomplicated: Secondary | ICD-10-CM | POA: Insufficient documentation

## 2011-05-21 DIAGNOSIS — F3289 Other specified depressive episodes: Secondary | ICD-10-CM | POA: Insufficient documentation

## 2011-05-21 DIAGNOSIS — F329 Major depressive disorder, single episode, unspecified: Secondary | ICD-10-CM | POA: Insufficient documentation

## 2011-05-21 DIAGNOSIS — H409 Unspecified glaucoma: Secondary | ICD-10-CM | POA: Insufficient documentation

## 2011-05-21 DIAGNOSIS — Z0289 Encounter for other administrative examinations: Secondary | ICD-10-CM | POA: Insufficient documentation

## 2011-05-21 LAB — URINALYSIS, ROUTINE W REFLEX MICROSCOPIC
Bilirubin Urine: NEGATIVE
Glucose, UA: NEGATIVE mg/dL
Hgb urine dipstick: NEGATIVE
Ketones, ur: NEGATIVE mg/dL
Protein, ur: NEGATIVE mg/dL
Urobilinogen, UA: 1 mg/dL (ref 0.0–1.0)

## 2011-05-21 LAB — COMPREHENSIVE METABOLIC PANEL
ALT: 16 U/L (ref 0–35)
Alkaline Phosphatase: 96 U/L (ref 39–117)
BUN: 17 mg/dL (ref 6–23)
CO2: 23 mEq/L (ref 19–32)
Calcium: 9.6 mg/dL (ref 8.4–10.5)
GFR calc Af Amer: >60 mL/min (ref >60)
GFR calc non Af Amer: >60 mL/min (ref >60)
Glucose, Bld: 104 mg/dL — ABNORMAL HIGH (ref 70–99)
Sodium: 138 mEq/L (ref 135–145)
Total Protein: 7 g/dL (ref 6.0–8.3)

## 2011-05-21 LAB — CBC
HCT: 36.9 % (ref 36.0–46.0)
Hemoglobin: 11.9 g/dL — ABNORMAL LOW (ref 12.0–15.0)
MCH: 27.2 pg (ref 26.0–34.0)
MCHC: 32.2 g/dL (ref 30.0–36.0)
MCV: 84.2 fL (ref 78.0–100.0)
RDW: 15.8 % — ABNORMAL HIGH (ref 11.5–15.5)

## 2011-05-21 LAB — DIFFERENTIAL
Basophils Absolute: 0 10*3/uL (ref 0.0–0.1)
Eosinophils Relative: 2 % (ref 0–5)
Lymphocytes Relative: 49 % — ABNORMAL HIGH (ref 12–46)
Lymphs Abs: 3.4 10*3/uL (ref 0.7–4.0)
Monocytes Absolute: 0.4 10*3/uL (ref 0.1–1.0)
Monocytes Relative: 6 % (ref 3–12)
Neutro Abs: 2.9 10*3/uL (ref 1.7–7.7)

## 2011-05-21 LAB — ETHANOL: Alcohol, Ethyl (B): <11 mg/dL — ABNORMAL HIGH (ref 0–10)

## 2011-05-21 LAB — RAPID URINE DRUG SCREEN, HOSP PERFORMED
Barbiturates: NOT DETECTED
Cocaine: POSITIVE — AB

## 2011-05-22 ENCOUNTER — Inpatient Hospital Stay: Payer: Self-pay | Admitting: Psychiatry

## 2011-09-24 LAB — URINALYSIS, ROUTINE W REFLEX MICROSCOPIC
Glucose, UA: NEGATIVE
Nitrite: NEGATIVE
Specific Gravity, Urine: 1.01
pH: 7

## 2011-09-24 LAB — RAPID URINE DRUG SCREEN, HOSP PERFORMED
Amphetamines: NOT DETECTED
Benzodiazepines: NOT DETECTED
Tetrahydrocannabinol: NOT DETECTED

## 2011-09-24 LAB — URINE MICROSCOPIC-ADD ON

## 2011-09-24 LAB — I-STAT 8, (EC8 V) (CONVERTED LAB)
Acid-Base Excess: 1
Bicarbonate: 24.7 — ABNORMAL HIGH
TCO2: 26
pCO2, Ven: 35.6 — ABNORMAL LOW
pH, Ven: 7.449 — ABNORMAL HIGH

## 2011-09-24 LAB — POCT I-STAT CREATININE
Creatinine, Ser: 0.9
Operator id: 277751

## 2011-10-30 ENCOUNTER — Emergency Department (HOSPITAL_COMMUNITY)
Admission: EM | Admit: 2011-10-30 | Discharge: 2011-10-30 | Disposition: A | Discharge status: Home / Self Care | Payer: Medicaid Other | Attending: Emergency Medicine | Admitting: Emergency Medicine

## 2011-10-30 ENCOUNTER — Other Ambulatory Visit: Payer: Self-pay

## 2011-10-30 DIAGNOSIS — M25519 Pain in unspecified shoulder: Secondary | ICD-10-CM | POA: Insufficient documentation

## 2011-10-30 DIAGNOSIS — Z79899 Other long term (current) drug therapy: Secondary | ICD-10-CM | POA: Insufficient documentation

## 2011-10-30 DIAGNOSIS — F17200 Nicotine dependence, unspecified, uncomplicated: Secondary | ICD-10-CM | POA: Insufficient documentation

## 2011-10-30 DIAGNOSIS — F3289 Other specified depressive episodes: Secondary | ICD-10-CM | POA: Insufficient documentation

## 2011-10-30 DIAGNOSIS — I10 Essential (primary) hypertension: Secondary | ICD-10-CM | POA: Insufficient documentation

## 2011-10-30 DIAGNOSIS — IMO0001 Reserved for inherently not codable concepts without codable children: Secondary | ICD-10-CM | POA: Insufficient documentation

## 2011-10-30 DIAGNOSIS — F329 Major depressive disorder, single episode, unspecified: Secondary | ICD-10-CM | POA: Insufficient documentation

## 2011-10-30 DIAGNOSIS — M6281 Muscle weakness (generalized): Secondary | ICD-10-CM | POA: Insufficient documentation

## 2011-10-30 DIAGNOSIS — M79609 Pain in unspecified limb: Secondary | ICD-10-CM | POA: Insufficient documentation

## 2011-10-30 HISTORY — DX: Essential (primary) hypertension: I10

## 2011-10-30 HISTORY — DX: Major depressive disorder, single episode, unspecified: F32.9

## 2011-10-30 HISTORY — DX: Depression, unspecified: F32.A

## 2011-10-30 MED ORDER — IBUPROFEN 200 MG PO TABS
600.0000 mg | ORAL_TABLET | Freq: Once | ORAL | Status: AC
  Administered 2011-10-30: 600 mg via ORAL
  Filled 2011-10-30: qty 1

## 2011-10-30 MED ORDER — TRAMADOL HCL 50 MG PO TABS: 50.0000 mg | ORAL_TABLET | Freq: Four times a day (QID) | ORAL | Status: AC | PRN

## 2011-10-30 MED ORDER — CYCLOBENZAPRINE HCL 10 MG PO TABS: 10.0000 mg | ORAL_TABLET | Freq: Two times a day (BID) | ORAL | Status: AC | PRN

## 2011-10-30 MED ORDER — IBUPROFEN 600 MG PO TABS: 600.0000 mg | ORAL_TABLET | Freq: Three times a day (TID) | ORAL | Status: AC

## 2011-10-30 NOTE — ED Provider Notes (Signed)
History     CSN: [id number] Arrival date & time: 10/30/2011  2:00 PM   First MD Initiated Contact with Patient 10/30/11 1653      Chief Complaint  Patient presents with  . Shoulder Pain    (Consider location/radiation/quality/duration/timing/severity/associated sxs/prior treatment) Patient is a 50 y.o. female presenting with shoulder pain. The history is provided by the patient.  Shoulder Pain This is a new problem. The current episode started in the past 7 days. The problem occurs constantly. The problem has been unchanged. Associated symptoms include myalgias (left arm). Pertinent negatives include no abdominal pain, chest pain, coughing, fever, headaches, nausea, neck pain or numbness. Exacerbated by: movement. She has tried nothing for the symptoms.    Past Medical History  Diagnosis Date  . Depression   . Hypertension   . Pneumothorax     History reviewed. No pertinent past surgical history.  History reviewed. No pertinent family history.  History  Substance Use Topics  . Smoking status: Current Everyday Smoker -- 0.5 packs/day  . Smokeless tobacco: Not on file  . Alcohol Use: Yes    OB History    Grav Para Term Preterm Abortions TAB SAB Ect Mult Living                  Review of Systems  Constitutional: Negative for fever.  HENT: Negative for neck pain.   Respiratory: Negative for cough and shortness of breath.   Cardiovascular: Negative for chest pain.  Gastrointestinal: Negative for nausea, abdominal pain and diarrhea.  Musculoskeletal: Positive for myalgias (left arm). Negative for back pain.  Neurological: Negative for numbness and headaches.  All other systems reviewed and are negative.    Allergies  Review of patient's allergies indicates no known allergies.  Home Medications   Current Outpatient Rx  Name Route Sig Dispense Refill  . LISINOPRIL 20 MG PO TABS Oral Take 20 mg by mouth daily.      Marland Kitchen OLANZAPINE 5 MG PO TABS Oral Take 5 mg by  mouth at bedtime.      . SERTRALINE HCL 100 MG PO TABS Oral Take 100 mg by mouth daily.      . TRAZODONE HCL 100 MG PO TABS Oral Take 200 mg by mouth at bedtime.        BP 142/100  Pulse 83  Temp(Src) 98.3 F (36.8 C) (Oral)  Resp 22  Ht 5\' 7"  (1.702 m)  Wt 158 lb (71.668 kg)  BMI 24.75 kg/m2  SpO2 99%  Physical Exam  Nursing note and vitals reviewed. Constitutional: She is oriented to person, place, and time. She appears well-developed and well-nourished. No distress.  HENT:  Head: Normocephalic and atraumatic.  Eyes: EOM are normal. Pupils are equal, round, and reactive to light.  Neck: Normal range of motion.  Cardiovascular: Normal rate, regular rhythm and normal heart sounds.   Pulmonary/Chest: Effort normal and breath sounds normal. No respiratory distress.  Abdominal: Soft. She exhibits no distension. There is no tenderness.  Musculoskeletal: Normal range of motion. She exhibits tenderness (left scapula, left arm and forearm).       Arms: Neurological: She is alert and oriented to person, place, and time.  Skin: Skin is warm and dry.    ED Course  Procedures (including critical care time)  Labs Reviewed - No data to display No results found.  Date: 10/30/2011  Rate: 74  Rhythm: normal sinus rhythm  QRS Axis: normal  Intervals: normal  ST/T Wave abnormalities: normal  Conduction Disutrbances:none  Narrative Interpretation:   Old EKG Reviewed: unchanged    1. Shoulder pain       MDM  5:02 PM Pt seen and examined. Pt with three days of left shoulder pain down her left arm. Movement and sensation are intact. Pt requesting ice pack, which will be provided. She has not tried any pain meds. Will give motrin here. Doubt ACS as EKG unchanged and patient without any chest pain or shortness of breath. Will discharge with motrin, ultram, and flexeril. Will also place pt in left arm sling for comfort until she is feeling better. Pt with full ROM so do not feel that  imaging is needed at this time.         Daleen Bo 10/30/11 2354

## 2011-10-30 NOTE — ED Notes (Signed)
Upon attempting to assess pt, she loudly began to state "why is the pt beside me being sen first, I was here before her."  Informed her that patients were seen by level of acuity and that the doctor would be to see her shortly.  She again raised her voice at which point I informed her that the behavior was inappropriate.

## 2011-10-30 NOTE — Progress Notes (Signed)
Orthopedic Tech Progress Note Patient Details:  Rhonda Dominguez 02-18-1961 [id number]  Other Ortho Devices Ortho Device Location: applied arm sling to (L) UE Ortho Device Interventions: Application   [name] 10/30/2011, 5:58 PM

## 2011-10-30 NOTE — ED Notes (Signed)
Pt medicated per order and provided with ice pack.

## 2011-10-30 NOTE — Progress Notes (Signed)
50 year old female complains of pain in her left upper arm radiating down to her hand and up into her shoulder. This is been present for 3 days. Is worse with any movement. On exam, she has marked tenderness to palpation anywhere. She does have full range of motion of all joints. She does not cooperate well for rotator cuff testing. She complains of pain with just about any movement of her arm. Neurovascular examination is intact. There is no C-spine tenderness. Symptoms do appear to be musculoskeletal.

## 2011-10-30 NOTE — ED Notes (Signed)
Ortho at bedside for sling application.

## 2011-10-30 NOTE — ED Notes (Signed)
Pt VSS, NAD resp equal/nonlabored.  Pt has no decrease in range of motion.

## 2011-10-30 NOTE — ED Notes (Signed)
PA at [facility] for evaluation of the pt and new orders received.

## 2011-10-30 NOTE — ED Notes (Signed)
Pt presents with L shoulder pain that radiates into L hand.  Pt denies any injury, pt reports pain x 3 days.  +shortness of breath.  Pt reports pain radiates into L scapula and into chest.

## 2011-10-31 NOTE — ED Provider Notes (Signed)
I saw and evaluated the patient, reviewed the resident's note and I agree with the findings and plan. I have reviewed and agree with the resident's interpretation LV ECG.  Dione Booze, MD 10/31/11 [phone]

## 2012-03-04 ENCOUNTER — Emergency Department (INDEPENDENT_AMBULATORY_CARE_PROVIDER_SITE_OTHER)
Admission: EM | Admit: 2012-03-04 | Discharge: 2012-03-04 | Disposition: A | Discharge status: Home / Self Care | Payer: Medicaid Other | Source: Home / Self Care | Attending: Family Medicine | Admitting: Family Medicine

## 2012-03-04 ENCOUNTER — Encounter (HOSPITAL_COMMUNITY): Payer: Self-pay

## 2012-03-04 DIAGNOSIS — L259 Unspecified contact dermatitis, unspecified cause: Secondary | ICD-10-CM

## 2012-03-04 DIAGNOSIS — L309 Dermatitis, unspecified: Secondary | ICD-10-CM

## 2012-03-04 HISTORY — DX: Other psychoactive substance abuse, uncomplicated: F19.10

## 2012-03-04 MED ORDER — TRIAMCINOLONE ACETONIDE 0.1 % EX OINT: TOPICAL_OINTMENT | Freq: Two times a day (BID) | CUTANEOUS | Status: DC

## 2012-03-04 NOTE — ED Provider Notes (Signed)
History     CSN: [id number]  Arrival date & time 03/04/12  1124   First MD Initiated Contact with Patient 03/04/12 1205      Chief Complaint  Patient presents with  . Rash    (Consider location/radiation/quality/duration/timing/severity/associated sxs/prior treatment) HPI Comments: Catie presents for evaluation of multiple lesions that appeared over her skin, over the last week. The lesions are small, "holes". She states that they itch. They are appearing. Over various parts of her body including her upper arms forearms, back, trunk, and buttocks. She reports, that she slept at a friend's house last week. She is unsure. She contracted something. There. She denies any new exposures such as soaps or lotions. She also reports aches over her body as well.   Patient is a 51 y.o. female presenting with rash. The history is provided by the patient.  Rash  This is a new problem. The current episode started more than 2 days ago. The problem has not changed since onset.The problem is associated with an unknown factor. There has been no fever. The rash is present on the torso, back, trunk, left arm, left hand, left upper leg, left buttock, right arm, right hand, right buttock, right upper leg and right lower leg. She has tried nothing for the symptoms.    Past Medical History  Diagnosis Date  . Depression   . Hypertension   . Pneumothorax   . Drug abuse     History reviewed. No pertinent past surgical history.  History reviewed. No pertinent family history.  History  Substance Use Topics  . Smoking status: Current Everyday Smoker -- 0.5 packs/day  . Smokeless tobacco: Not on file  . Alcohol Use: Yes    OB History    Grav Para Term Preterm Abortions TAB SAB Ect Mult Living                  Review of Systems  Constitutional: Negative.   HENT: Negative.   Eyes: Negative.   Respiratory: Negative.   Cardiovascular: Negative.   Gastrointestinal: Negative.   Genitourinary:  Negative.   Musculoskeletal: Positive for myalgias.  Skin: Positive for rash.  Neurological: Negative.     Allergies  Review of patient's allergies indicates no known allergies.  Home Medications   Current Outpatient Rx  Name Route Sig Dispense Refill  . LISINOPRIL 20 MG PO TABS Oral Take 20 mg by mouth daily.      Marland Kitchen OLANZAPINE 5 MG PO TABS Oral Take 5 mg by mouth at bedtime.      . SERTRALINE HCL 100 MG PO TABS Oral Take 100 mg by mouth daily.      . TRAZODONE HCL 100 MG PO TABS Oral Take 200 mg by mouth at bedtime.      . TRIAMCINOLONE ACETONIDE 0.1 % EX OINT Topical Apply topically 2 (two) times daily. 80 g 0    BP 152/95  Pulse 62  Temp(Src) 98.5 F (36.9 C) (Oral)  Resp 16  SpO2 100%  Physical Exam  Nursing note and vitals reviewed. Constitutional: She is oriented to person, place, and time. She appears well-developed and well-nourished.  HENT:  Head: Normocephalic and atraumatic.  Eyes: EOM are normal.  Neck: Normal range of motion.  Pulmonary/Chest: Effort normal and breath sounds normal. She has no decreased breath sounds. She has no wheezes. She has no rhonchi.  Musculoskeletal: Normal range of motion.  Neurological: She is alert and oriented to person, place, and time.  Skin: Skin is  warm and dry.     Psychiatric: Her behavior is normal.    ED Course  Procedures (including critical care time)  Labs Reviewed - No data to display No results found.   1. Dermatitis       MDM  Unclear etiology of rash; likely bites from an insect or mite; triamcinolone ointment rx given; advised OTC NSAID or acetaminophen        Renaee Munda, MD 03/04/12 1444

## 2012-03-04 NOTE — Discharge Instructions (Signed)
Apply steroid ointment as directed. May also use an over the counter antihistamine such as diphenhydramine (Benadryl), loratadine (Claritin), cetirizine (Zyrtec), or fexofenadine (Allegra). I recommend pain control with acetaminophen (Tylenol) and/or ibuprofen. You may use these together, alternating them every 4 hours, or individually, every 8 hours. For example, take acetaminophen 500 to 1000 mg at 12 noon, then 600 to 800 mg of ibuprofen at 4 pm, then acetaminophen at 8 pm, etc. Also, stay hydrated with clear liquids. Return to care should your symptoms not improve, or worsen in any way.

## 2012-03-04 NOTE — ED Notes (Signed)
C/o 5-7 day duration of cough, body aches, generalized rash, itching; states she has not used drugs in over 3 years; deep avulsive type scratches on right upper arm, reddened ; restless

## 2012-03-10 ENCOUNTER — Encounter (HOSPITAL_COMMUNITY): Payer: Self-pay | Admitting: *Deleted

## 2012-03-10 ENCOUNTER — Other Ambulatory Visit: Payer: Self-pay

## 2012-03-10 ENCOUNTER — Emergency Department (HOSPITAL_COMMUNITY)
Admission: EM | Admit: 2012-03-10 | Discharge: 2012-03-10 | Disposition: A | Discharge status: Home / Self Care | Payer: Medicaid Other | Attending: Emergency Medicine | Admitting: Emergency Medicine

## 2012-03-10 DIAGNOSIS — F1410 Cocaine abuse, uncomplicated: Secondary | ICD-10-CM | POA: Insufficient documentation

## 2012-03-10 DIAGNOSIS — F329 Major depressive disorder, single episode, unspecified: Secondary | ICD-10-CM | POA: Insufficient documentation

## 2012-03-10 DIAGNOSIS — I10 Essential (primary) hypertension: Secondary | ICD-10-CM | POA: Insufficient documentation

## 2012-03-10 DIAGNOSIS — F3289 Other specified depressive episodes: Secondary | ICD-10-CM | POA: Insufficient documentation

## 2012-03-10 LAB — RAPID URINE DRUG SCREEN, HOSP PERFORMED
Amphetamines: NOT DETECTED
Benzodiazepines: NOT DETECTED
Opiates: NOT DETECTED
Tetrahydrocannabinol: NOT DETECTED

## 2012-03-10 LAB — CBC
MCH: 27.5 pg (ref 26.0–34.0)
MCHC: 32.3 g/dL (ref 30.0–36.0)
Platelets: 259 10*3/uL (ref 150–400)
RDW: 14.9 % (ref 11.5–15.5)

## 2012-03-10 LAB — COMPREHENSIVE METABOLIC PANEL
ALT: 20 U/L (ref 0–35)
Albumin: 4.5 g/dL (ref 3.5–5.2)
Alkaline Phosphatase: 77 U/L (ref 39–117)
Calcium: 9.9 mg/dL (ref 8.4–10.5)
Potassium: 4.1 mEq/L (ref 3.5–5.1)
Sodium: 135 mEq/L (ref 135–145)
Total Protein: 8.5 g/dL — ABNORMAL HIGH (ref 6.0–8.3)

## 2012-03-10 LAB — DIFFERENTIAL
Basophils Relative: 0 % (ref 0–1)
Eosinophils Absolute: 0.1 10*3/uL (ref 0.0–0.7)
Neutrophils Relative %: 68 % (ref 43–77)

## 2012-03-10 LAB — ACETAMINOPHEN LEVEL: Acetaminophen (Tylenol), Serum: <15 ug/mL (ref 10–30)

## 2012-03-10 MED ORDER — ZOLPIDEM TARTRATE 5 MG PO TABS: 5.0000 mg | ORAL_TABLET | Freq: Every evening | ORAL | Status: DC | PRN

## 2012-03-10 MED ORDER — ALUM & MAG HYDROXIDE-SIMETH 200-200-20 MG/5ML PO SUSP: 30.0000 mL | ORAL | Status: DC | PRN

## 2012-03-10 MED ORDER — ONDANSETRON HCL 4 MG PO TABS: 4.0000 mg | ORAL_TABLET | Freq: Three times a day (TID) | ORAL | Status: DC | PRN

## 2012-03-10 MED ORDER — ACETAMINOPHEN 325 MG PO TABS: 650.0000 mg | ORAL_TABLET | ORAL | Status: DC | PRN

## 2012-03-10 MED ORDER — IBUPROFEN 600 MG PO TABS: 600.0000 mg | ORAL_TABLET | Freq: Three times a day (TID) | ORAL | Status: DC | PRN

## 2012-03-10 MED ORDER — NICOTINE 21 MG/24HR TD PT24: 21.0000 mg | MEDICATED_PATCH | Freq: Every day | TRANSDERMAL | Status: DC

## 2012-03-10 MED ORDER — LORAZEPAM 1 MG PO TABS: 1.0000 mg | ORAL_TABLET | Freq: Three times a day (TID) | ORAL | Status: DC | PRN

## 2012-03-10 NOTE — ED Notes (Signed)
Pt states wants detox from cocaine.pt state she drinks on occasions. Pt denies any pain or use of other drugs.pt states she went to [facility] 3/27 for detox and went back home on and was not able to stop

## 2012-03-10 NOTE — ED Provider Notes (Signed)
History     CSN: [id number]  Arrival date & time 03/10/12  1015   First MD Initiated Contact with Patient 03/10/12 1040      Chief Complaint  Patient presents with  . Medical Clearance    (Consider location/radiation/quality/duration/timing/severity/associated sxs/prior treatment) The history is provided by the patient.  51 y/o F presents to ED with c/c of request for assistance with cocaine detox. Chronic use. Last use overnight. Has not been eating or drinking as much when she uses cocaine. Also with alcohol use, no more than 1 beverage every 1-2 days. Denies SI or HI. Denies hallucinations. Denies pain. Requesting "food and a bed".   Past Medical History  Diagnosis Date  . Depression   . Hypertension   . Pneumothorax   . Drug abuse     History reviewed. No pertinent past surgical history.  No family history on file.  History  Substance Use Topics  . Smoking status: Current Everyday Smoker -- 0.5 packs/day  . Smokeless tobacco: Not on file  . Alcohol Use: Yes    Review of Systems  Constitutional: Negative for fever and chills.  HENT: Negative for sore throat, neck pain and neck stiffness.   Eyes: Negative for pain and visual disturbance.  Respiratory: Negative for cough and shortness of breath.   Cardiovascular: Negative for chest pain and leg swelling.  Gastrointestinal: Negative for nausea, vomiting and abdominal pain.  Genitourinary: Negative for dysuria and hematuria.  Musculoskeletal: Negative for back pain and gait problem.  Skin: Negative for wound.  Neurological: Negative for dizziness, syncope, weakness and headaches.  Psychiatric/Behavioral: Negative for suicidal ideas and confusion. The patient is nervous/anxious.     Allergies  Review of patient's allergies indicates no known allergies.  Home Medications   Current Outpatient Rx  Name Route Sig Dispense Refill  . LISINOPRIL 20 MG PO TABS Oral Take 20 mg by mouth daily.      Marland Kitchen OLANZAPINE 5 MG PO  TABS Oral Take 5 mg by mouth at bedtime.      . SERTRALINE HCL 100 MG PO TABS Oral Take 100 mg by mouth daily.      . TRAZODONE HCL 100 MG PO TABS Oral Take 200 mg by mouth at bedtime.      . TRIAMCINOLONE ACETONIDE 0.1 % EX OINT Topical Apply topically 2 (two) times daily. 80 g 0    BP 158/100  Pulse 102  Temp(Src) 97.8 F (36.6 C) (Oral)  Resp 18  SpO2 98%  Physical Exam  Nursing note and vitals reviewed. Constitutional: She is oriented to person, place, and time. She appears well-developed and well-nourished. No distress.  HENT:  Head: Normocephalic and atraumatic.  Right Ear: External ear normal.  Eyes: Right eye exhibits no discharge. Left eye exhibits no discharge.  Neck: Normal range of motion. Neck supple.  Cardiovascular: Normal rate, regular rhythm and normal heart sounds.   Pulmonary/Chest: Effort normal and breath sounds normal. No respiratory distress. She has no wheezes.  Abdominal: Soft. She exhibits no distension. There is no tenderness.  Musculoskeletal: She exhibits no edema and no tenderness.  Neurological: She is alert and oriented to person, place, and time. No cranial nerve deficit.       Normal gait  Skin: Skin is warm and dry.  Psychiatric: Her mood appears anxious. She expresses no homicidal and no suicidal ideation.    ED Course  Procedures (including critical care time)  Labs Reviewed  COMPREHENSIVE METABOLIC PANEL - Abnormal; Notable for the  following:    Total Protein 8.5 (*)    GFR calc non Af Amer 80 (*)    All other components within normal limits  URINE RAPID DRUG SCREEN (HOSP PERFORMED) - Abnormal; Notable for the following:    Cocaine POSITIVE (*)    All other components within normal limits  ETHANOL  CBC  DIFFERENTIAL  ACETAMINOPHEN LEVEL   No results found.   Dx 1: Cocaine abuse   MDM  Request for medical clearance with cocaine detox. No inpatient facilities available for this complaint. Pt has been given outpatient resources  by assessment counselor and voices understanding of plan. Will d.c home.        Shaaron Adler, PA-C 03/10/12 1400

## 2012-03-10 NOTE — ED Provider Notes (Signed)
Medical screening examination/treatment/procedure(s) were performed by non-physician practitioner and as supervising physician I was immediately available for consultation/collaboration.  Ethelda Chick, MD 03/10/12 [phone]

## 2012-03-10 NOTE — ED Notes (Signed)
Pt has been wanded and search[place]

## 2012-03-10 NOTE — ED Notes (Signed)
Wants detox from Cocaine, seen by ACT team, seen by MD/PA, will be discharged home w/information resources.

## 2012-03-10 NOTE — Discharge Instructions (Signed)
Use the resources given to you by [facility] for help in stopping using cocaine. Return to the ED if you feel you may harm yourself or others.     Cocaine Abuse PROBLEMS FROM USING COCAINE:   Highly addictive.   Illegal.   Risk of sudden death.   Heart disease.   Irregular heart beat.   High blood pressure.   Damage to nose and lungs.   Severe agitation.   Hallucinations.   Violent behavior.   Paranoia.   Sexual dysfunction.  Most cocaine users deny that they have a problem with addiction. The biggest problem is admitting that you are dependent on cocaine. Those trying to quit using it may experience depression and withdrawal symptoms. Other withdrawal symptoms include fatigue, suicidal thoughts, sleepiness, restlessness, anxiety, and increased craving for cocaine. There are medications available to help prevent depression associated with stopping cocaine. Most users will find a support group or treatment program helpful in coming off and staying off cocaine. The best chance to cure cocaine addiction is to go into group therapy and to be in a drug-free environment. It is very important to develop healthy relationships and avoid socializing with people who use or deal drugs. Eat well, and give your body the proper rest and healthy exercise it needs. You may need medication to help treat withdrawal symptoms. Call your caregiver or a drug treatment center for more help.  You may also want to call the [facility] on Drug Abuse at 800-662-HELP in the [place] SEEK IMMEDIATE MEDICAL CARE IF:  You develop severe chest pain.   You develop shortness of breath.   You develop extreme agitation.  Document Released: 01/02/2005 Document Revised: 11/14/2011 Document Reviewed: 09/27/2009 [facility] Patient Information 2012 [place], [place].

## 2012-03-10 NOTE — ED Notes (Signed)
Brought to room 36, patient states she is going to go to sleep

## 2012-04-10 ENCOUNTER — Encounter (HOSPITAL_COMMUNITY): Payer: Self-pay

## 2012-04-10 ENCOUNTER — Emergency Department (INDEPENDENT_AMBULATORY_CARE_PROVIDER_SITE_OTHER)
Admission: EM | Admit: 2012-04-10 | Discharge: 2012-04-10 | Disposition: A | Discharge status: Home / Self Care | Payer: Medicaid Other | Source: Home / Self Care | Attending: Family Medicine | Admitting: Family Medicine

## 2012-04-10 DIAGNOSIS — L739 Follicular disorder, unspecified: Secondary | ICD-10-CM

## 2012-04-10 DIAGNOSIS — H60399 Other infective otitis externa, unspecified ear: Secondary | ICD-10-CM

## 2012-04-10 DIAGNOSIS — H6012 Cellulitis of left external ear: Secondary | ICD-10-CM

## 2012-04-10 DIAGNOSIS — L738 Other specified follicular disorders: Secondary | ICD-10-CM

## 2012-04-10 MED ORDER — HYDROXYZINE HCL 25 MG PO TABS: 25.0000 mg | ORAL_TABLET | Freq: Three times a day (TID) | ORAL | Status: DC | PRN

## 2012-04-10 MED ORDER — ACETAMINOPHEN-CODEINE #3 300-30 MG PO TABS: 1.0000 | ORAL_TABLET | Freq: Four times a day (QID) | ORAL | Status: AC | PRN

## 2012-04-10 MED ORDER — AMOXICILLIN-POT CLAVULANATE 500-125 MG PO TABS: 1.0000 | ORAL_TABLET | Freq: Three times a day (TID) | ORAL | Status: DC

## 2012-04-10 NOTE — ED Provider Notes (Signed)
History     CSN: [id number]  Arrival date & time 04/10/12  1422   First MD Initiated Contact with Patient 04/10/12 1429      Chief Complaint  Patient presents with  . Insect Bite    (Consider location/radiation/quality/duration/timing/severity/associated sxs/prior treatment) HPI Comments: 51 y/o smoker female here c/o swelling, itchiness and tenderness in left ear and right lower scalp noticed first 2 days ago. She thinks she has had insect bites and were initially itchy, she has been scratching and today both areas are swollen and painful, no spontaneus drainage. No fever or chills. patient is non diabetic.    Past Medical History  Diagnosis Date  . Depression   . Hypertension   . Pneumothorax   . Drug abuse     History reviewed. No pertinent past surgical history.  No family history on file.  History  Substance Use Topics  . Smoking status: Current Everyday Smoker -- 0.5 packs/day  . Smokeless tobacco: Not on file  . Alcohol Use: Yes    OB History    Grav Para Term Preterm Abortions TAB SAB Ect Mult Living                  Review of Systems  Constitutional: Negative for fever, chills and appetite change.  HENT: Negative for facial swelling and neck stiffness.   Skin:       As per HPI  All other systems reviewed and are negative.    Allergies  Review of patient's allergies indicates no known allergies.  Home Medications   Current Outpatient Rx  Name Route Sig Dispense Refill  . ACETAMINOPHEN-CODEINE #3 300-30 MG PO TABS Oral Take 1-2 tablets by mouth every 6 (six) hours as needed for pain. 15 tablet 0  . AMOXICILLIN-POT CLAVULANATE 500-125 MG PO TABS Oral Take 1 tablet (500 mg total) by mouth 3 (three) times daily. 21 tablet 0  . HYDROXYZINE HCL 25 MG PO TABS Oral Take 1 tablet (25 mg total) by mouth every 8 (eight) hours as needed for itching. 20 tablet 0  . LISINOPRIL 20 MG PO TABS Oral Take 20 mg by mouth daily.      Marland Kitchen OLANZAPINE 5 MG PO TABS Oral  Take 5 mg by mouth at bedtime.      . SERTRALINE HCL 100 MG PO TABS Oral Take 100 mg by mouth daily.      . TRAZODONE HCL 100 MG PO TABS Oral Take 200 mg by mouth at bedtime.      . TRIAMCINOLONE ACETONIDE 0.1 % EX OINT Topical Apply topically 2 (two) times daily. 80 g 0    BP 149/98  Pulse 83  Temp(Src) 98.4 F (36.9 C) (Oral)  Resp 16  SpO2 100%  Physical Exam  Nursing note and vitals reviewed. Constitutional: She is oriented to person, place, and time. She appears well-developed and well-nourished. No distress.  HENT:  Head: Normocephalic and atraumatic.  Right Ear: External ear normal.  Nose: Nose normal.  Mouth/Throat: No oropharyngeal exudate.       Left ear:  Superficial abrasions, swelling, redness over most of the helix, area tender to palpation.  Antihelix, tragus, antitragus and ear lobe appear normal with no erythema or swelling.  Normal ear canal and TM. Right ear exam normal.  Cardiovascular: Normal heart sounds.   Pulmonary/Chest: Breath sounds normal.  Neurological: She is alert and oriented to person, place, and time.  Skin:       Right occipital area: with erythema,  mild diffused tenderness and central small scabs. No significant  induration or fluctuations.     ED Course  Procedures (including critical care time)  Labs Reviewed - No data to display No results found.   1. Cellulitis of helix, left   2. Folliculitis       MDM  Concerned for possible cartilage infection of the external ear.Treated with Augmentin and vistaril for itchiness. Asked to call ENT office for close follow up.        Sharin Grave, MD 04/10/12 2204

## 2012-04-10 NOTE — ED Notes (Signed)
C/o bug bite to lt ear and rt post scalp since yesterday.  Areas are painful and swollen.

## 2012-04-10 NOTE — Discharge Instructions (Signed)
Do you have inflammation in your external ear that could represent an infection. If untreated these infections can lead to deformity.  You need to start taking the prescribed medications today as instructed call the ear nose and throat specialist to make an appointment as soon as possible.

## 2012-04-13 ENCOUNTER — Encounter (HOSPITAL_COMMUNITY): Payer: Self-pay | Admitting: Emergency Medicine

## 2012-04-13 ENCOUNTER — Emergency Department (HOSPITAL_COMMUNITY)
Admission: EM | Admit: 2012-04-13 | Discharge: 2012-04-13 | Disposition: A | Discharge status: Home / Self Care | Payer: Medicaid Other | Attending: Emergency Medicine | Admitting: Emergency Medicine

## 2012-04-13 DIAGNOSIS — I10 Essential (primary) hypertension: Secondary | ICD-10-CM | POA: Insufficient documentation

## 2012-04-13 DIAGNOSIS — R21 Rash and other nonspecific skin eruption: Secondary | ICD-10-CM

## 2012-04-13 DIAGNOSIS — Z79899 Other long term (current) drug therapy: Secondary | ICD-10-CM | POA: Insufficient documentation

## 2012-04-13 DIAGNOSIS — F329 Major depressive disorder, single episode, unspecified: Secondary | ICD-10-CM | POA: Insufficient documentation

## 2012-04-13 DIAGNOSIS — F3289 Other specified depressive episodes: Secondary | ICD-10-CM | POA: Insufficient documentation

## 2012-04-13 MED ORDER — DIPHENHYDRAMINE HCL 50 MG/ML IJ SOLN
50.0000 mg | Freq: Once | INTRAMUSCULAR | Status: AC
  Administered 2012-04-13: 50 mg via INTRAMUSCULAR
  Filled 2012-04-13: qty 1

## 2012-04-13 MED ORDER — DIPHENHYDRAMINE HCL 25 MG PO TABS: 50.0000 mg | ORAL_TABLET | Freq: Four times a day (QID) | ORAL | Status: DC | PRN

## 2012-04-13 NOTE — ED Notes (Signed)
Dr frazier paged.

## 2012-04-13 NOTE — ED Provider Notes (Signed)
Medical screening examination/treatment/procedure(s) were performed by non-physician practitioner and as supervising physician I was immediately available for consultation/collaboration.   Gwyneth Sprout, MD 04/13/12 2155

## 2012-04-13 NOTE — ED Provider Notes (Signed)
History     CSN: [id number]  Arrival date & time 04/13/12  0920   First MD Initiated Contact with Patient 04/13/12 1047      Chief Complaint  Patient presents with  . Rash    (Consider location/radiation/quality/duration/timing/severity/associated sxs/prior treatment) HPI History obtained from the patient. Patient is a 51 year old female with a past medical history of HTN and multiple prior psychiatric evals who presented with the chief complaint of her rash the last month. Patient stayed at a friend's house that was not clean and is concerned that she has insects crawling "inside me". Rash has been progressing over the last month. She has seen her regular doctor as well as an urgent care for this. Described as itching red bumps. Denies any fever, chills, sore throat, joint swelling. Nothing makes her symptoms better or worse. Has been taking Atarax as prescribed at urgent care several days ago. Per patient, Dr. Bascom Levels told her to come to the ER today for evaluation. Patient denies any recent drug use.   Past Medical History  Diagnosis Date  . Depression   . Hypertension   . Pneumothorax   . Drug abuse     History reviewed. No pertinent past surgical history.  No family history on file.  History  Substance Use Topics  . Smoking status: Current Everyday Smoker -- 0.5 packs/day  . Smokeless tobacco: Not on file  . Alcohol Use: Yes    OB History    Grav Para Term Preterm Abortions TAB SAB Ect Mult Living                  Review of Systems  Constitutional: Negative for fever and chills.  HENT: Negative for sore throat and neck stiffness.   Gastrointestinal: Negative for vomiting.  Musculoskeletal: Negative for joint swelling.  Skin: Positive for rash.  Neurological: Negative for syncope and headaches.    Allergies  Review of patient's allergies indicates no known allergies.  Home Medications   Current Outpatient Rx  Name Route Sig Dispense Refill  . LISINOPRIL  20 MG PO TABS Oral Take 20 mg by mouth daily.      Marland Kitchen OLANZAPINE 5 MG PO TABS Oral Take 5 mg by mouth at bedtime.      . SERTRALINE HCL 100 MG PO TABS Oral Take 100 mg by mouth daily.      . TRAZODONE HCL 100 MG PO TABS Oral Take 200 mg by mouth at bedtime.      . TRIAMCINOLONE ACETONIDE 0.1 % EX OINT Topical Apply topically 2 (two) times daily. 80 g 0  . ACETAMINOPHEN-CODEINE #3 300-30 MG PO TABS Oral Take 1-2 tablets by mouth every 6 (six) hours as needed for pain. 15 tablet 0  . AMOXICILLIN-POT CLAVULANATE 500-125 MG PO TABS Oral Take 1 tablet by mouth 3 (three) times daily. Recently prescribed. Not taking yet    . HYDROXYZINE HCL 25 MG PO TABS Oral Take 25 mg by mouth every 8 (eight) hours as needed. For itching. Recently prescribed. Not taking yet      BP 155/94  Pulse 84  Temp(Src) 98.4 F (36.9 C) (Oral)  Resp 18  SpO2 98%  Physical Exam  Nursing note reviewed. Constitutional: She appears well-developed and well-nourished. Distressed: incredibly anxious appearing, tearful intermittently during examination.       Vital signs are reviewed and are significant for hypertension.  HENT:  Head: Normocephalic and atraumatic.  Right Ear: External ear normal.  Left Ear: External ear normal.  Mouth/Throat: Oropharynx is clear and moist.       No lesions noted in the mouth. Specifically, no Koplik spots are seen. Patient is edentulous.  Neck: Normal range of motion. Neck supple.  Cardiovascular: Normal rate and regular rhythm.   Pulmonary/Chest: Effort normal. No respiratory distress.  Musculoskeletal: She exhibits no edema.  Neurological: She is alert.  Skin: Skin is warm and dry. Rash noted.       Lesions to scalp, back, buttocks, extremities that are difficult to qualify as they have been scratched to the point of being scabbed over. No lesions have surrounding erythema, induration, fluctuance, or excess warmth to suggest cellulitis or abscess.  Psychiatric: Her mood appears anxious.  Her speech is rapid and/or pressured. She is agitated.    ED Course  Procedures (including critical care time)  Labs Reviewed - No data to display No results found.    Diagnosis 1: Rash   MDM  One month the rash. Suspect bedbugs. No other systemic signs of illness. Discussed this with patient. I spoke with Dr. Bascom Levels at 11:15. He was apparently concerned as the patient called in today and said she had a new insect bite to her scalp. On exam, this appears consistent with the other lesions to her body. She was treated in the emergency department with Benadryl with good improvement in her symptoms. Her blood pressure improved in the emergency department after treatment. She will be discharged home. Dr. Bascom Levels is working on setting up a dermatology followup for her.        Shaaron Adler, PA-C 04/13/12 1230

## 2012-04-13 NOTE — ED Notes (Signed)
Rash has been going on for a month states saw dr frasier and he did not know what it was  Pt was told to come to er

## 2012-04-13 NOTE — Discharge Instructions (Signed)
Your rash is likely from bed bugs. Everything in your house needs to be cleaned with very hot steam or high heat in the dryer. Please discuss this with the exterminator who is coming to your house.     Rash A rash is a change in the color or texture of your skin. There are many different types of rashes. You may have other problems that accompany your rash. CAUSES   Infections.   Allergic reactions. This can include allergies to pets or foods.   Certain medicines.   Exposure to certain chemicals, soaps, or cosmetics.   Heat.   Exposure to poisonous plants.   Tumors, both cancerous and noncancerous.  SYMPTOMS   Redness.   Scaly skin.   Itchy skin.   Dry or cracked skin.   Bumps.   Blisters.   Pain.  DIAGNOSIS  Your caregiver may do a physical exam to determine what type of rash you have. A skin sample (biopsy) may be taken and examined under a microscope. TREATMENT  Treatment depends on the type of rash you have. Your caregiver may prescribe certain medicines. For serious conditions, you may need to see a skin doctor (dermatologist). HOME CARE INSTRUCTIONS   Avoid the substance that caused your rash.   Do not scratch your rash. This can cause infection.   You may take cool baths to help stop itching.   Only take over-the-counter or prescription medicines as directed by your caregiver.   Keep all follow-up appointments as directed by your caregiver.  SEEK IMMEDIATE MEDICAL CARE IF:  You have increasing pain, swelling, or redness.   You have a fever.   You have new or severe symptoms.   You have body aches, diarrhea, or vomiting.   Your rash is not better after 3 days.  MAKE SURE YOU:  Understand these instructions.   Will watch your condition.   Will get help right away if you are not doing well or get worse.  Document Released: 11/15/2002 Document Revised: 11/14/2011 Document Reviewed: 09/09/2011 [facility] Patient Information 2012 [place],  [place].

## 2012-04-13 NOTE — ED Notes (Signed)
Spoke with dr frazier, he sts for edp to see patient and then to call him and let him know what is going on.

## 2012-09-07 ENCOUNTER — Encounter (HOSPITAL_COMMUNITY): Payer: Self-pay | Admitting: Emergency Medicine

## 2012-09-07 ENCOUNTER — Emergency Department (HOSPITAL_COMMUNITY)
Admission: EM | Admit: 2012-09-07 | Discharge: 2012-09-07 | Disposition: A | Discharge status: Home / Self Care | Payer: Medicaid Other | Attending: Emergency Medicine | Admitting: Emergency Medicine

## 2012-09-07 DIAGNOSIS — L0390 Cellulitis, unspecified: Secondary | ICD-10-CM

## 2012-09-07 DIAGNOSIS — I10 Essential (primary) hypertension: Secondary | ICD-10-CM | POA: Insufficient documentation

## 2012-09-07 DIAGNOSIS — F17200 Nicotine dependence, unspecified, uncomplicated: Secondary | ICD-10-CM | POA: Insufficient documentation

## 2012-09-07 DIAGNOSIS — IMO0001 Reserved for inherently not codable concepts without codable children: Secondary | ICD-10-CM | POA: Insufficient documentation

## 2012-09-07 MED ORDER — HYDROCODONE-ACETAMINOPHEN 5-325 MG PO TABS: 1.0000 | ORAL_TABLET | Freq: Four times a day (QID) | ORAL | Status: DC | PRN

## 2012-09-07 MED ORDER — BACITRACIN ZINC 500 UNIT/GM EX OINT
1.0000 "application " | TOPICAL_OINTMENT | Freq: Once | CUTANEOUS | Status: AC
  Administered 2012-09-07: 1 via TOPICAL
  Filled 2012-09-07: qty 15

## 2012-09-07 MED ORDER — NAPROXEN 500 MG PO TABS: 500.0000 mg | ORAL_TABLET | Freq: Two times a day (BID) | ORAL | Status: DC

## 2012-09-07 MED ORDER — DOXYCYCLINE HYCLATE 100 MG PO TABS
100.0000 mg | ORAL_TABLET | Freq: Two times a day (BID) | ORAL | Status: DC
  Administered 2012-09-07: 100 mg via ORAL
  Filled 2012-09-07: qty 1

## 2012-09-07 MED ORDER — DOXYCYCLINE HYCLATE 100 MG PO TABS: 100.0000 mg | ORAL_TABLET | Freq: Two times a day (BID) | ORAL | Status: DC

## 2012-09-07 MED ORDER — HYDROCODONE-ACETAMINOPHEN 5-325 MG PO TABS
1.0000 | ORAL_TABLET | Freq: Once | ORAL | Status: AC
  Administered 2012-09-07: 1 via ORAL
  Filled 2012-09-07: qty 1

## 2012-09-07 NOTE — ED Notes (Signed)
Bug bite left fore arm since last night had some itching now left arm hurting and red swollen  Pt states it was more swoolen last night

## 2012-09-07 NOTE — ED Provider Notes (Signed)
History     CSN: [id number]  Arrival date & time 09/07/12  0815   First MD Initiated Contact with Patient 09/07/12 0818      Chief Complaint  Patient presents with  . Insect Bite    HPI The patient presents to the emergency room with complaints of pain and swelling in her left distal forearm area. Patient thinks she got bitten by some type of bug last night. Initially it was mostly itching. This morning the arm is more swollen although was worse last night. Now it is painful to the touch.  She denies any fevers coughing numbness or weakness. She denies any recent injury. Patient denies any intravenous injections. Past Medical History  Diagnosis Date  . Depression   . Hypertension   . Pneumothorax   . Drug abuse     No past surgical history on file.  No family history on file.  History  Substance Use Topics  . Smoking status: Current Every Day Smoker -- 0.5 packs/day  . Smokeless tobacco: Not on file  . Alcohol Use: Yes    OB History    Grav Para Term Preterm Abortions TAB SAB Ect Mult Living                  Review of Systems  All other systems reviewed and are negative.    Allergies  Review of patient's allergies indicates no known allergies.  Home Medications   Current Outpatient Rx  Name Route Sig Dispense Refill  . DIPHENHYDRAMINE HCL 25 MG PO TABS Oral Take 2 tablets (50 mg total) by mouth every 6 (six) hours as needed for itching. 20 tablet 0  . LISINOPRIL 20 MG PO TABS Oral Take 20 mg by mouth daily.      Marland Kitchen OLANZAPINE 5 MG PO TABS Oral Take 5 mg by mouth at bedtime.      . SERTRALINE HCL 100 MG PO TABS Oral Take 100 mg by mouth daily.      . TRAZODONE HCL 100 MG PO TABS Oral Take 200 mg by mouth at bedtime.      . TRIAMCINOLONE ACETONIDE 0.1 % EX OINT Topical Apply topically 2 (two) times daily. 80 g 0    BP 145/101  Pulse 81  Temp 97.5 F (36.4 C) (Oral)  Resp 18  SpO2 97%  Physical Exam  Nursing note and vitals reviewed. Constitutional:  She appears well-developed and well-nourished. No distress.  HENT:  Head: Normocephalic and atraumatic.  Right Ear: External ear normal.  Left Ear: External ear normal.  Eyes: Conjunctivae normal are normal. Right eye exhibits no discharge. Left eye exhibits no discharge. No scleral icterus.  Neck: Neck supple. No tracheal deviation present.  Cardiovascular: Normal rate, regular rhythm and intact distal pulses.   Pulmonary/Chest: Effort normal and breath sounds normal. No stridor. No respiratory distress. She has no wheezes. She has no rales.  Abdominal: Soft. Bowel sounds are normal. She exhibits no distension. There is no tenderness. There is no rebound and no guarding.  Musculoskeletal: She exhibits edema and tenderness.       Left forearm: She exhibits tenderness and swelling. She exhibits no deformity.       Small area of excoriation centrally, several centimeter area of erythema surrounding the forearm no fluctuance, patient has a few small pin prick type scabs more proximately that she states is due to scratching  Neurological: She is alert. She has normal strength. No sensory deficit. Cranial nerve deficit:  no gross  defecits noted. She exhibits normal muscle tone. She displays no seizure activity. Coordination normal.  Skin: Skin is warm and dry. No rash noted.  Psychiatric: She has a normal mood and affect.    ED Course  Procedures (including critical care time)  Medications  HYDROcodone-acetaminophen (NORCO/VICODIN) 5-325 MG per tablet 1 tablet (not administered)  doxycycline (VIBRA-TABS) tablet 100 mg (not administered)  diphenhydrAMINE (SOMINEX) 25 MG tablet (not administered)  triamcinolone ointment (KENALOG) 0.1 % (not administered)    Labs Reviewed - No data to display No results found.    MDM  Patient denies any intravenous drug use. She does appear to have a cellulitis on exam does not appear to have an abscess at this time. I will start her on doxycycline to cover  for MRSA. I instructed the patient to apply warm compresses to keep her arm elevated. She is to follow up with her primary doctor in the next couple of days having this arm rechecked.        Celene Kras, MD 09/07/12 [phone]

## 2012-09-20 ENCOUNTER — Encounter (HOSPITAL_COMMUNITY): Payer: Self-pay | Admitting: *Deleted

## 2012-09-20 ENCOUNTER — Emergency Department (HOSPITAL_COMMUNITY)
Admission: EM | Admit: 2012-09-20 | Discharge: 2012-09-20 | Disposition: A | Discharge status: Home / Self Care | Payer: Medicaid Other | Attending: Emergency Medicine | Admitting: Emergency Medicine

## 2012-09-20 DIAGNOSIS — IMO0002 Reserved for concepts with insufficient information to code with codable children: Secondary | ICD-10-CM | POA: Insufficient documentation

## 2012-09-20 DIAGNOSIS — L0390 Cellulitis, unspecified: Secondary | ICD-10-CM

## 2012-09-20 DIAGNOSIS — L02419 Cutaneous abscess of limb, unspecified: Secondary | ICD-10-CM | POA: Insufficient documentation

## 2012-09-20 DIAGNOSIS — F3289 Other specified depressive episodes: Secondary | ICD-10-CM | POA: Insufficient documentation

## 2012-09-20 DIAGNOSIS — F17200 Nicotine dependence, unspecified, uncomplicated: Secondary | ICD-10-CM | POA: Insufficient documentation

## 2012-09-20 DIAGNOSIS — F329 Major depressive disorder, single episode, unspecified: Secondary | ICD-10-CM | POA: Insufficient documentation

## 2012-09-20 DIAGNOSIS — F1410 Cocaine abuse, uncomplicated: Secondary | ICD-10-CM | POA: Insufficient documentation

## 2012-09-20 DIAGNOSIS — I10 Essential (primary) hypertension: Secondary | ICD-10-CM | POA: Insufficient documentation

## 2012-09-20 MED ORDER — SULFAMETHOXAZOLE-TRIMETHOPRIM 800-160 MG PO TABS: 2.0000 | ORAL_TABLET | Freq: Two times a day (BID) | ORAL | Status: DC

## 2012-09-20 MED ORDER — HYDROXYZINE HCL 25 MG PO TABS: 25.0000 mg | ORAL_TABLET | Freq: Four times a day (QID) | ORAL | Status: DC

## 2012-09-20 NOTE — ED Provider Notes (Signed)
Medical screening examination/treatment/procedure(s) were performed by non-physician practitioner and as supervising physician I was immediately available for consultation/collaboration.    Nelia Shi, MD 09/20/12 2158

## 2012-09-20 NOTE — ED Notes (Signed)
Pt is here with holes in legs per patient and thinks she may have blood clot.  Knot to back of neck, right leg gives out and blurry vision.  Pt talking rapidly

## 2012-09-20 NOTE — ED Provider Notes (Signed)
History     CSN: [id number]  Arrival date & time 09/20/12  7846   First MD Initiated Contact with Patient 09/20/12 1013      Chief Complaint  Patient presents with  . Abscess    posterior neck    (Consider location/radiation/quality/duration/timing/severity/associated sxs/prior treatment) HPI  51 y.o. female appears acutely agitated complaining of pruritic lesions to arms and legs over the course of 2 months. Patient has seen her doctor multiple times for the same she denies fever, nausea vomiting. Patient denies IV drug use or skin popping. She reports a bite and swelling to the back of her neck. This has been going on for 2 days. Patient was seen for similar 2 weeks ago and written prescriptions for pain control and antibiotics which she did not fill.  Past Medical History  Diagnosis Date  . Depression   . Hypertension   . Pneumothorax   . Drug abuse     History reviewed. No pertinent past surgical history.  No family history on file.  History  Substance Use Topics  . Smoking status: Current Every Day Smoker -- 0.5 packs/day  . Smokeless tobacco: Not on file  . Alcohol Use: Yes     occ    OB History    Grav Para Term Preterm Abortions TAB SAB Ect Mult Living                  Review of Systems  Constitutional: Negative for fever.  Respiratory: Negative for shortness of breath.   Cardiovascular: Negative for chest pain.  Gastrointestinal: Negative for nausea, vomiting, abdominal pain and diarrhea.  Skin: Positive for rash.  All other systems reviewed and are negative.    Allergies  Review of patient's allergies indicates no known allergies.  Home Medications   Current Outpatient Rx  Name Route Sig Dispense Refill  . LISINOPRIL 20 MG PO TABS Oral Take 20 mg by mouth daily.      . SERTRALINE HCL 100 MG PO TABS Oral Take 100 mg by mouth daily.      Marland Kitchen DOXYCYCLINE HYCLATE 100 MG PO TABS Oral Take 1 tablet (100 mg total) by mouth 2 (two) times daily. 20  tablet 0  . HYDROCODONE-ACETAMINOPHEN 5-325 MG PO TABS Oral Take 1-2 tablets by mouth every 6 (six) hours as needed for pain. 16 tablet 0  . HYDROXYZINE HCL 25 MG PO TABS Oral Take 1 tablet (25 mg total) by mouth every 6 (six) hours. 12 tablet 0  . NAPROXEN 500 MG PO TABS Oral Take 1 tablet (500 mg total) by mouth 2 (two) times daily. 30 tablet 0  . SULFAMETHOXAZOLE-TRIMETHOPRIM 800-160 MG PO TABS Oral Take 2 tablets by mouth 2 (two) times daily. 28 tablet 0    BP 193/112  Pulse 86  Temp 98.2 F (36.8 C) (Oral)  Resp 18  SpO2 97%  Physical Exam  Nursing note and vitals reviewed. Constitutional: She is oriented to person, place, and time. She appears well-developed and well-nourished. No distress.  HENT:  Head: Normocephalic.  Mouth/Throat: Oropharynx is clear and moist.  Eyes: Conjunctivae normal and EOM are normal.  Cardiovascular: Normal rate and intact distal pulses.   Pulmonary/Chest: Effort normal and breath sounds normal. No stridor. No respiratory distress. She has no wheezes. She has no rales. She exhibits no tenderness.  Abdominal: Soft. Bowel sounds are normal. There is no tenderness.  Musculoskeletal: Normal range of motion.  Neurological: She is alert and oriented to person, place, and time.  Skin:       Excoriation to all extremities. No signs of infection: Warmth, tenderness, induration, discharge. No hives.  1 cm indurated early abscess to left posterior hairline. No fluctuance .  Psychiatric: She has a normal mood and affect.    ED Course  Procedures (including critical care time)  Labs Reviewed - No data to display No results found.   1. Cellulitis       MDM  Suspects formication secondary to cocaine abuse. There is no signs of bug bites or allergic reaction. I will treat her symptomatically with Atarax and advise topical hydrocortisone use.  Patient also does have a small pimple/early indurated abscess to left posterior neck. It is not appropriate  for incision and drainage at this time. I advised her to fill her prescriptions and instructed her to go to [facility] where antibiotics or free or [facility] where they are $4.   Pt verbalized understanding and agrees with care plan. Outpatient follow-up and return precautions given.           Wynetta Emery, PA-C 09/20/12 1059

## 2012-11-11 ENCOUNTER — Other Ambulatory Visit (HOSPITAL_COMMUNITY): Payer: Self-pay | Admitting: Nephrology

## 2012-11-11 ENCOUNTER — Ambulatory Visit (HOSPITAL_COMMUNITY)
Admission: RE | Admit: 2012-11-11 | Discharge: 2012-11-11 | Disposition: A | Discharge status: Home / Self Care | Payer: Medicaid Other | Source: Ambulatory Visit | Attending: Nephrology | Admitting: Nephrology

## 2012-11-11 DIAGNOSIS — R079 Chest pain, unspecified: Secondary | ICD-10-CM

## 2012-11-11 DIAGNOSIS — R05 Cough: Secondary | ICD-10-CM | POA: Insufficient documentation

## 2012-11-11 DIAGNOSIS — R059 Cough, unspecified: Secondary | ICD-10-CM | POA: Insufficient documentation

## 2012-11-11 DIAGNOSIS — I517 Cardiomegaly: Secondary | ICD-10-CM | POA: Insufficient documentation

## 2012-11-11 DIAGNOSIS — R509 Fever, unspecified: Secondary | ICD-10-CM | POA: Insufficient documentation

## 2012-11-12 ENCOUNTER — Ambulatory Visit (HOSPITAL_COMMUNITY)
Admission: RE | Admit: 2012-11-12 | Discharge: 2012-11-12 | Disposition: A | Discharge status: Home / Self Care | Payer: Medicaid Other | Source: Ambulatory Visit | Attending: Nephrology | Admitting: Nephrology

## 2012-11-12 DIAGNOSIS — M79609 Pain in unspecified limb: Secondary | ICD-10-CM

## 2012-11-12 DIAGNOSIS — R52 Pain, unspecified: Secondary | ICD-10-CM

## 2012-11-12 DIAGNOSIS — M7989 Other specified soft tissue disorders: Secondary | ICD-10-CM | POA: Insufficient documentation

## 2012-12-18 ENCOUNTER — Emergency Department (HOSPITAL_COMMUNITY)
Admission: EM | Admit: 2012-12-18 | Discharge: 2012-12-18 | Disposition: A | Discharge status: Home / Self Care | Payer: Medicaid Other | Attending: Emergency Medicine | Admitting: Emergency Medicine

## 2012-12-18 ENCOUNTER — Encounter (HOSPITAL_COMMUNITY): Payer: Self-pay | Admitting: *Deleted

## 2012-12-18 DIAGNOSIS — I10 Essential (primary) hypertension: Secondary | ICD-10-CM | POA: Insufficient documentation

## 2012-12-18 DIAGNOSIS — F3289 Other specified depressive episodes: Secondary | ICD-10-CM | POA: Insufficient documentation

## 2012-12-18 DIAGNOSIS — F17200 Nicotine dependence, unspecified, uncomplicated: Secondary | ICD-10-CM | POA: Insufficient documentation

## 2012-12-18 DIAGNOSIS — Z8709 Personal history of other diseases of the respiratory system: Secondary | ICD-10-CM | POA: Insufficient documentation

## 2012-12-18 DIAGNOSIS — F329 Major depressive disorder, single episode, unspecified: Secondary | ICD-10-CM | POA: Insufficient documentation

## 2012-12-18 DIAGNOSIS — Z79899 Other long term (current) drug therapy: Secondary | ICD-10-CM | POA: Insufficient documentation

## 2012-12-18 DIAGNOSIS — J3489 Other specified disorders of nose and nasal sinuses: Secondary | ICD-10-CM | POA: Insufficient documentation

## 2012-12-18 DIAGNOSIS — R21 Rash and other nonspecific skin eruption: Secondary | ICD-10-CM | POA: Insufficient documentation

## 2012-12-18 DIAGNOSIS — J029 Acute pharyngitis, unspecified: Secondary | ICD-10-CM | POA: Insufficient documentation

## 2012-12-18 DIAGNOSIS — F1910 Other psychoactive substance abuse, uncomplicated: Secondary | ICD-10-CM | POA: Insufficient documentation

## 2012-12-18 DIAGNOSIS — R0982 Postnasal drip: Secondary | ICD-10-CM | POA: Insufficient documentation

## 2012-12-18 MED ORDER — BENZOCAINE 20 % MT SOLN: 1.0000 "application " | Freq: Two times a day (BID) | OROMUCOSAL | Status: DC | PRN

## 2012-12-18 MED ORDER — HYDROCORTISONE 1 % EX LOTN: TOPICAL_LOTION | Freq: Two times a day (BID) | CUTANEOUS | Status: DC

## 2012-12-18 MED ORDER — HYDROXYZINE HCL 25 MG PO TABS: 25.0000 mg | ORAL_TABLET | Freq: Four times a day (QID) | ORAL | Status: DC

## 2012-12-18 MED ORDER — IPRATROPIUM BROMIDE 0.03 % NA SOLN: 2.0000 | Freq: Two times a day (BID) | NASAL | Status: DC

## 2012-12-18 MED ORDER — IBUPROFEN 400 MG PO TABS: 400.0000 mg | ORAL_TABLET | Freq: Four times a day (QID) | ORAL | Status: DC | PRN

## 2012-12-18 NOTE — ED Notes (Signed)
Patient is here for complaints of sore throat and she wants the flu shot.  Patient states her primary wont give her the shot.   She is also concerned about rash on her arms

## 2012-12-18 NOTE — ED Provider Notes (Signed)
History     CSN: [id number]  Arrival date & time 12/18/12  0905   First MD Initiated Contact with Patient 12/18/12 [phone]      Chief Complaint  Patient presents with  . Sore Throat  . Flu Vaccine    (Consider location/radiation/quality/duration/timing/severity/associated sxs/prior treatment) The history is provided by the patient and medical records.    Rhonda Dominguez is a 52 y.o. female  with a hx of HTN, drug abuse, depression presents to the Emergency Department complaining of gradual, persistent, progressively worsening rash on bilateral arms onset 2-3 months ago.  She was seen here and given doxycycline which made it better, but she has continued to pick it and scratch it.  Associated symptoms include itching.  Atarax makes it better and nothing makes it worse.  Pt denies fever, chills, drainage from the site.  Pt states she is going to call the dermatologist on Monday and make an appointment.  She denies any history of IV drug use.  Pt also c/o sore throat and nasal congestion.  Pt states her nose has been running down the back of her throat since yesterday and awoke this morning with a sore throat.  She states she's not tried anything for this. Nothing makes it better and nothing makes it worse. She states she's not had a flu shot and she cannot get one from her primary care physician.     Past Medical History  Diagnosis Date  . Depression   . Hypertension   . Pneumothorax   . Drug abuse     History reviewed. No pertinent past surgical history.  No family history on file.  History  Substance Use Topics  . Smoking status: Current Every Day Smoker -- 0.5 packs/day  . Smokeless tobacco: Not on file  . Alcohol Use: Yes     Comment: occ    OB History    Grav Para Term Preterm Abortions TAB SAB Ect Mult Living                  Review of Systems  Constitutional: Negative for fever, chills and fatigue.  HENT: Positive for congestion, sore throat and postnasal drip.  Negative for ear pain, facial swelling, rhinorrhea, drooling, mouth sores, trouble swallowing, neck pain, neck stiffness, dental problem and voice change.   Eyes: Negative for pain.  Respiratory: Negative for cough, chest tightness and shortness of breath.   Cardiovascular: Negative for chest pain.  Gastrointestinal: Negative for nausea, vomiting and abdominal pain.  Skin: Positive for rash.  Neurological: Negative for facial asymmetry and headaches.  Hematological: Negative for adenopathy.  Psychiatric/Behavioral: The patient is not nervous/anxious.   All other systems reviewed and are negative.    Allergies  Review of patient's allergies indicates no known allergies.  Home Medications   Current Outpatient Rx  Name  Route  Sig  Dispense  Refill  . DOXYCYCLINE HYCLATE 100 MG PO CPEP   Oral   Take 100 mg by mouth 2 (two) times daily.         Marland Kitchen HYDROXYZINE HCL 25 MG PO TABS   Oral   Take 1 tablet (25 mg total) by mouth every 6 (six) hours.   12 tablet   0   . LISINOPRIL 20 MG PO TABS   Oral   Take 20 mg by mouth daily.           . QUETIAPINE FUMARATE 100 MG PO TABS   Oral   Take 100 mg by  mouth at bedtime.         . SERTRALINE HCL 100 MG PO TABS   Oral   Take 100 mg by mouth daily.           Marland Kitchen BENZOCAINE 20 % MT SOLN   Mouth/Throat   Use as directed 1 application in the mouth or throat 2 (two) times daily as needed.   9.75 mL   0   . HYDROCORTISONE 1 % EX LOTN   Topical   Apply topically 2 (two) times daily.   118 mL   0   . HYDROXYZINE HCL 25 MG PO TABS   Oral   Take 1 tablet (25 mg total) by mouth every 6 (six) hours.   12 tablet   0   . IBUPROFEN 400 MG PO TABS   Oral   Take 1 tablet (400 mg total) by mouth every 6 (six) hours as needed for pain.   30 tablet   0   . IPRATROPIUM BROMIDE 0.03 % NA SOLN   Nasal   Place 2 sprays into the nose 2 (two) times daily. PRN congestion   30 mL   0     BP 151/103  Pulse 76  Temp 98 F (36.7 C)  (Oral)  Resp 16  SpO2 97%  Physical Exam  Nursing note and vitals reviewed. Constitutional: She appears well-developed and well-nourished. No distress.  HENT:  Head: Normocephalic and atraumatic.  Right Ear: Tympanic membrane, external ear and ear canal normal.  Left Ear: Tympanic membrane, external ear and ear canal normal.  Nose: Mucosal edema present.  Mouth/Throat: Uvula is midline, oropharynx is clear and moist and mucous membranes are normal. No oropharyngeal exudate, posterior oropharyngeal edema, posterior oropharyngeal erythema or tonsillar abscesses.  Eyes: Conjunctivae normal are normal. Pupils are equal, round, and reactive to light. No scleral icterus.  Neck: Normal range of motion. Neck supple.  Cardiovascular: Normal rate, regular rhythm, normal heart sounds and intact distal pulses.  Exam reveals no gallop and no friction rub.   No murmur heard. Pulmonary/Chest: Effort normal and breath sounds normal. No respiratory distress. She has no wheezes. She has no rales. She exhibits no tenderness.  Abdominal: Soft. Bowel sounds are normal. She exhibits no distension and no mass. There is no tenderness. There is no rebound and no guarding.  Musculoskeletal: Normal range of motion. She exhibits no edema and no tenderness.  Lymphadenopathy:    She has no cervical adenopathy.  Neurological: She is alert.       Speech is clear and goal oriented Moves extremities without ataxia  Skin: Skin is warm and dry. She is not diaphoretic.       Multiple sites of excoriations and open lesions on the arms, legs and 1 spot in the abdomen all in different stages of healing. NO gross abscess or evidence of cellulitis.    Psychiatric: She has a normal mood and affect. Her speech is rapid and/or pressured. She is is hyperactive.    ED Course  Procedures (including critical care time)  Labs Reviewed - No data to display No results found.   1. Sore throat   2. Rash       MDM  Brunilda Payor presents with sore throat likely 2/2 to postnasal drip.  Pt with centor criteria 0 therefore no Rapid strep was obtained.  Pt adamantly denies IV drug use or skin popping, but I suspect otherwise.  Pt spends entire visit asking for vicodin.  She also wants a flu shot today stating she cannot afford to go to an outside facility and her doctor "won't give me one."  Record review shows pt was seen for similar rash and abscess about 1 mo ago and given doxy.  I do not believe that antibiotics are indicated at this time as patient has no signs of cellulitis or abscess anywhere. I discussed with her the need to stop scratching the spots and to stop any behaviors that might be causing them.  I will give hydrocortisone and Atarax. I have discussed with her that I will not give Vicodin.  Will send her to the health department for her flu shot. Blood pressure slightly elevated at 151/103 however she is on her way to see her primary care physician this morning.    1. Medications: Atrovent, Hurricaine throat spray, Mucinex, hydrocortisone, Atarax, usual home medications 2. Treatment: rest, drink plenty of fluids, do not scratch open sites 3. Follow Up: Please followup with your primary doctor for discussion of your diagnoses and further evaluation after today's visit; if you do not have a primary care doctor use the resource guide provided to find one; find a dermatologist to followup with for your open sores.        Dahlia Client Keimora Swartout, PA-C 12/18/12 1057

## 2012-12-18 NOTE — ED Provider Notes (Signed)
Medical screening examination/treatment/procedure(s) were performed by non-physician practitioner and as supervising physician I was immediately available for consultation/collaboration.   Glynn Octave, MD 12/18/12 1721

## 2012-12-18 NOTE — ED Notes (Signed)
Reports rash to right arm, not wanting to jacket off to allow RN to assess. "PA has already seen it". Pt is alert and oriented.

## 2013-02-11 ENCOUNTER — Emergency Department (HOSPITAL_COMMUNITY)
Admission: EM | Admit: 2013-02-11 | Discharge: 2013-02-11 | Disposition: A | Discharge status: Home / Self Care | Payer: Medicaid Other | Attending: Emergency Medicine | Admitting: Emergency Medicine

## 2013-02-11 ENCOUNTER — Encounter (HOSPITAL_COMMUNITY): Payer: Self-pay | Admitting: *Deleted

## 2013-02-11 DIAGNOSIS — I10 Essential (primary) hypertension: Secondary | ICD-10-CM | POA: Insufficient documentation

## 2013-02-11 DIAGNOSIS — R112 Nausea with vomiting, unspecified: Secondary | ICD-10-CM | POA: Insufficient documentation

## 2013-02-11 DIAGNOSIS — Z8659 Personal history of other mental and behavioral disorders: Secondary | ICD-10-CM | POA: Insufficient documentation

## 2013-02-11 DIAGNOSIS — R079 Chest pain, unspecified: Secondary | ICD-10-CM | POA: Insufficient documentation

## 2013-02-11 DIAGNOSIS — Z79899 Other long term (current) drug therapy: Secondary | ICD-10-CM | POA: Insufficient documentation

## 2013-02-11 DIAGNOSIS — L299 Pruritus, unspecified: Secondary | ICD-10-CM | POA: Insufficient documentation

## 2013-02-11 DIAGNOSIS — R0602 Shortness of breath: Secondary | ICD-10-CM | POA: Insufficient documentation

## 2013-02-11 DIAGNOSIS — Z8709 Personal history of other diseases of the respiratory system: Secondary | ICD-10-CM | POA: Insufficient documentation

## 2013-02-11 DIAGNOSIS — F17200 Nicotine dependence, unspecified, uncomplicated: Secondary | ICD-10-CM | POA: Insufficient documentation

## 2013-02-11 DIAGNOSIS — F1910 Other psychoactive substance abuse, uncomplicated: Secondary | ICD-10-CM | POA: Insufficient documentation

## 2013-02-11 DIAGNOSIS — Y939 Activity, unspecified: Secondary | ICD-10-CM | POA: Insufficient documentation

## 2013-02-11 DIAGNOSIS — Y929 Unspecified place or not applicable: Secondary | ICD-10-CM | POA: Insufficient documentation

## 2013-02-11 DIAGNOSIS — R197 Diarrhea, unspecified: Secondary | ICD-10-CM | POA: Insufficient documentation

## 2013-02-11 MED ORDER — DOXEPIN HCL 25 MG PO CAPS: 25.0000 mg | ORAL_CAPSULE | Freq: Every day | ORAL | Status: DC

## 2013-02-11 MED ORDER — DIPHENHYDRAMINE HCL 25 MG PO TABS: 50.0000 mg | ORAL_TABLET | ORAL | Status: DC | PRN

## 2013-02-11 MED ORDER — ASPIRIN 325 MG PO TABS: 325.0000 mg | ORAL_TABLET | ORAL | Status: DC

## 2013-02-11 MED ORDER — NITROGLYCERIN 0.4 MG SL SUBL: 0.4000 mg | SUBLINGUAL_TABLET | SUBLINGUAL | Status: DC | PRN

## 2013-02-11 MED ORDER — PERMETHRIN 5 % EX CREA: TOPICAL_CREAM | CUTANEOUS | Status: DC

## 2013-02-11 NOTE — ED Provider Notes (Signed)
History     CSN: [id number]  Arrival date & time 02/11/13  1023   First MD Initiated Contact with Patient 02/11/13 1150      Chief Complaint  Patient presents with  . Insect Bite  . Chest Pain    (Consider location/radiation/quality/duration/timing/severity/associated sxs/prior treatment) HPI This 52 year old female has several months of constant 24-hour a day generalized itching and scratches herself from head to toe almost all day every day with unremarkable evaluation by dermatology in the past including biopsy, her family doctor in dermatologist did not know why she has generalized itching her dermatologist suggested it is from her anxiety, the patient denies any threats or herself or others, the patient denies suicidal or homicidal ideation or hallucinations, patient appears anxious but denies anxiety, patient has a constant vague chest discomfort 24 hours a day for several months as well with no fever, cough, shortness of breath, abdominal pain, she does have chronic unchanged diarrhea and occasional vomiting for several months as well but that is not new or worsening either doxycycline and topical steroid have not helped the patient's itching the past. Past Medical History  Diagnosis Date  . Depression   . Hypertension   . Pneumothorax   . Drug abuse     History reviewed. No pertinent past surgical history.  No family history on file.  History  Substance Use Topics  . Smoking status: Current Every Day Smoker -- 0.50 packs/day  . Smokeless tobacco: Not on file  . Alcohol Use: Yes     Comment: occ    OB History   Grav Para Term Preterm Abortions TAB SAB Ect Mult Living                  Review of Systems 10 Systems reviewed and are negative for acute change except as noted in the HPI. Allergies  Review of patient's allergies indicates no known allergies.  Home Medications   Current Outpatient Rx  Name  Route  Sig  Dispense  Refill  . benzocaine (HURRICAINE) 20  % SOLN   Mouth/Throat   Use as directed 1 application in the mouth or throat 2 (two) times daily as needed.   9.75 mL   0   . diphenhydrAMINE (BENADRYL) 25 MG tablet   Oral   Take 2 tablets (50 mg total) by mouth every 4 (four) hours as needed for itching.   20 tablet   0   . doxepin (SINEQUAN) 25 MG capsule   Oral   Take 1 capsule (25 mg total) by mouth at bedtime.   15 capsule   0   . doxycycline (DORYX) 100 MG DR capsule   Oral   Take 100 mg by mouth 2 (two) times daily.         . hydrocortisone 1 % lotion   Topical   Apply topically 2 (two) times daily.   118 mL   0   . hydrOXYzine (ATARAX/VISTARIL) 25 MG tablet   Oral   Take 1 tablet (25 mg total) by mouth every 6 (six) hours.   12 tablet   0   . hydrOXYzine (ATARAX/VISTARIL) 25 MG tablet   Oral   Take 1 tablet (25 mg total) by mouth every 6 (six) hours.   12 tablet   0   . ibuprofen (ADVIL,MOTRIN) 400 MG tablet   Oral   Take 1 tablet (400 mg total) by mouth every 6 (six) hours as needed for pain.   30 tablet  0   . ipratropium (ATROVENT) 0.03 % nasal spray   Nasal   Place 2 sprays into the nose 2 (two) times daily. PRN congestion   30 mL   0   . lisinopril (PRINIVIL,ZESTRIL) 20 MG tablet   Oral   Take 20 mg by mouth daily.           . permethrin (ELIMITE) 5 % cream      Apply to entire body for 8 hours then wash off, reapply one time in 7 days for 8 hours then wash off again, avoid eyes, nose, mouth   60 g   0   . QUEtiapine (SEROQUEL) 100 MG tablet   Oral   Take 100 mg by mouth at bedtime.         . sertraline (ZOLOFT) 100 MG tablet   Oral   Take 100 mg by mouth daily.             BP 134/102  Pulse 60  Temp(Src) 98 F (36.7 C) (Oral)  Resp 16  SpO2 100%  Physical Exam  Nursing note and vitals reviewed. Constitutional:  Awake, alert, nontoxic appearance.  HENT:  Head: Atraumatic.  Eyes: Right eye exhibits no discharge. Left eye exhibits no discharge.  Neck: Neck  supple.  Cardiovascular: Normal rate and regular rhythm.   No murmur heard. Pulmonary/Chest: Effort normal and breath sounds normal. No respiratory distress. She has no wheezes. She has no rales. She exhibits no tenderness.  Abdominal: Soft. There is no tenderness. There is no rebound.  Musculoskeletal: She exhibits no tenderness.  Baseline ROM, no obvious new focal weakness.  Neurological: She is alert.  Mental status and motor strength appears baseline for patient and situation.  Skin: Rash noted.  Multiple areas of superficial excoriation with tiny scabs as well as hyperpigmented scarring without surrounding cellulitis without vesicles without purulent drainage without obvious abscesses in these areas of excoriation are on her scalp neck back chest abdomen arms and legs  Psychiatric:  Anxious but without suicidal or homicidal ideation or hallucinations    ED Course  Procedures (including critical care time) ECG: Normal sinus rhythm, normal axis, normal intervals, no acute ischemic changes noted, impression normal ECG, no significant change noted compared with April 2013  Labs Reviewed - No data to display No results found.   1. Itching       MDM  Pt stable in ED with no significant deterioration in condition.  Patient / Family / Caregiver informed of clinical course, understand medical decision-making process, and agree with plan.  I doubt any other EMC precluding discharge at this time including, but not necessarily limited to the following:SBI.        Hurman Horn, MD 02/11/13 2113

## 2013-02-11 NOTE — ED Notes (Signed)
Pt reports chest pain x 1 day, in central chest describes as aching/stinging. Reports nausea/vomiting/diarrhea. Reports intermittent shortness of breath. Pt also reports rash- states, something is biting her. Seen at [facility] a month ago with no definitive answer.

## 2013-03-18 ENCOUNTER — Other Ambulatory Visit: Payer: Self-pay | Admitting: Nephrology

## 2013-03-18 DIAGNOSIS — Z1231 Encounter for screening mammogram for malignant neoplasm of breast: Secondary | ICD-10-CM

## 2013-03-25 ENCOUNTER — Ambulatory Visit: Payer: Medicaid Other

## 2013-04-23 ENCOUNTER — Ambulatory Visit: Payer: Medicaid Other

## 2013-11-01 ENCOUNTER — Emergency Department (HOSPITAL_COMMUNITY)
Admission: EM | Admit: 2013-11-01 | Discharge: 2013-11-01 | Disposition: A | Discharge status: Home / Self Care | Payer: Medicaid Other | Attending: Emergency Medicine | Admitting: Emergency Medicine

## 2013-11-01 ENCOUNTER — Encounter (HOSPITAL_COMMUNITY): Payer: Self-pay | Admitting: Emergency Medicine

## 2013-11-01 DIAGNOSIS — Z79899 Other long term (current) drug therapy: Secondary | ICD-10-CM | POA: Insufficient documentation

## 2013-11-01 DIAGNOSIS — R079 Chest pain, unspecified: Secondary | ICD-10-CM | POA: Insufficient documentation

## 2013-11-01 DIAGNOSIS — F17200 Nicotine dependence, unspecified, uncomplicated: Secondary | ICD-10-CM | POA: Insufficient documentation

## 2013-11-01 DIAGNOSIS — F3289 Other specified depressive episodes: Secondary | ICD-10-CM | POA: Insufficient documentation

## 2013-11-01 DIAGNOSIS — L282 Other prurigo: Secondary | ICD-10-CM

## 2013-11-01 DIAGNOSIS — F329 Major depressive disorder, single episode, unspecified: Secondary | ICD-10-CM | POA: Insufficient documentation

## 2013-11-01 DIAGNOSIS — M545 Low back pain, unspecified: Secondary | ICD-10-CM | POA: Insufficient documentation

## 2013-11-01 DIAGNOSIS — I10 Essential (primary) hypertension: Secondary | ICD-10-CM | POA: Insufficient documentation

## 2013-11-01 DIAGNOSIS — Z8709 Personal history of other diseases of the respiratory system: Secondary | ICD-10-CM | POA: Insufficient documentation

## 2013-11-01 DIAGNOSIS — E119 Type 2 diabetes mellitus without complications: Secondary | ICD-10-CM | POA: Insufficient documentation

## 2013-11-01 DIAGNOSIS — R21 Rash and other nonspecific skin eruption: Secondary | ICD-10-CM | POA: Insufficient documentation

## 2013-11-01 HISTORY — DX: Type 2 diabetes mellitus without complications: E11.9

## 2013-11-01 LAB — URINALYSIS, ROUTINE W REFLEX MICROSCOPIC
Bilirubin Urine: NEGATIVE
Glucose, UA: NEGATIVE mg/dL
Hgb urine dipstick: NEGATIVE
Ketones, ur: 15 mg/dL — AB
Nitrite: NEGATIVE
Specific Gravity, Urine: 1.028 (ref 1.005–1.030)
pH: 5.5 (ref 5.0–8.0)

## 2013-11-01 LAB — GLUCOSE, CAPILLARY: Glucose-Capillary: 81 mg/dL (ref 70–99)

## 2013-11-01 LAB — BASIC METABOLIC PANEL
BUN: 14 mg/dL (ref 6–23)
Chloride: 105 mEq/L (ref 96–112)
Creatinine, Ser: 0.99 mg/dL (ref 0.50–1.10)
Glucose, Bld: 87 mg/dL (ref 70–99)
Potassium: 3.6 mEq/L (ref 3.5–5.1)

## 2013-11-01 LAB — POCT I-STAT TROPONIN I: Troponin i, poc: 0 ng/mL (ref 0.00–0.08)

## 2013-11-01 LAB — CBC
HCT: 34.6 % — ABNORMAL LOW (ref 36.0–46.0)
Hemoglobin: 11.5 g/dL — ABNORMAL LOW (ref 12.0–15.0)
MCH: 28.8 pg (ref 26.0–34.0)
MCV: 86.5 fL (ref 78.0–100.0)
RDW: 14.6 % (ref 11.5–15.5)
WBC: 6.5 10*3/uL (ref 4.0–10.5)

## 2013-11-01 LAB — URINE MICROSCOPIC-ADD ON

## 2013-11-01 MED ORDER — DIPHENHYDRAMINE HCL 25 MG PO TABS: 25.0000 mg | ORAL_TABLET | Freq: Four times a day (QID) | ORAL | Status: DC | PRN

## 2013-11-01 MED ORDER — DIPHENHYDRAMINE HCL 25 MG PO CAPS
25.0000 mg | ORAL_CAPSULE | Freq: Once | ORAL | Status: AC
  Administered 2013-11-01: 25 mg via ORAL
  Filled 2013-11-01: qty 1

## 2013-11-01 NOTE — ED Notes (Signed)
Pt requesting pain medication and cream and all of her meds to be covered by medicaid because pt sts "i ain't got the time or money for none of this". RN sts patient can get pain meds through PCP. EDP sts will not rx pain medications. Pt requesting cream for body that is covered my medicaid. RN made pt aware she can get steroid cream OTC. Pt sts she wants a cream already paid for "that the doctor has to give me" RN sts will ask EDP.   Dr. Rubin Payor sts pt can take otc cream. Pt left prior to RN informing pt.

## 2013-11-01 NOTE — ED Notes (Signed)
Pt is here with chest pain.  Reports not had diabetes medication in 2 days, needs physical.  Pt reports rash all over that started last nite at friends house.

## 2013-11-01 NOTE — ED Notes (Signed)
Attempted to call patient for triage x2. 

## 2013-11-01 NOTE — ED Notes (Signed)
Attempted to call patient for triage x 1 

## 2013-11-01 NOTE — ED Provider Notes (Signed)
CSN: [id number]     Arrival date & time 11/01/13  1249 History   First MD Initiated Contact with Patient 11/01/13 1601     Chief Complaint  Patient presents with  . Chest Pain  . Rash  . Diabetes   (Consider location/radiation/quality/duration/timing/severity/associated sxs/prior Treatment) Patient is a 52 y.o. female presenting with chest pain, rash, and diabetes problem. The history is provided by the patient.  Chest Pain Associated symptoms: back pain   Associated symptoms: no abdominal pain, no fatigue, no headache, no numbness, no shortness of breath and no weakness   Rash Associated symptoms: no abdominal pain, no fatigue, no headaches and no shortness of breath   Diabetes Associated symptoms include chest pain. Pertinent negatives include no abdominal pain, no headaches and no shortness of breath.  patient states that she developed an itchy rash this morning. She states that she stayed at someone elses house last night and woke up itching. She states that she has had this before and was told that it was her nerves. She states that there were dogs at the house. She also states that she has not had her diabetes medicine for a while. She also states that she has had some chest pain. It is over her whole chest and constant. She also states that she is havine 10/10 back pain in her lower back that she usually gets percocet 15 for. She states that her doctor told her that there is something with her kidney also, but she has been unable to go back she states that she does not know what was done to tell that there was something wrong with it.   Past Medical History  Diagnosis Date  . Depression   . Hypertension   . Pneumothorax   . Drug abuse   . Diabetes mellitus without complication    History reviewed. No pertinent past surgical history. No family history on file. History  Substance Use Topics  . Smoking status: Current Every Day Smoker -- 0.50 packs/day  . Smokeless tobacco: Not  on file  . Alcohol Use: Yes     Comment: occ   OB History   Grav Para Term Preterm Abortions TAB SAB Ect Mult Living                 Review of Systems  Constitutional: Negative for activity change, fatigue and unexpected weight change.  Eyes: Negative for pain.  Respiratory: Negative for shortness of breath.   Cardiovascular: Positive for chest pain.  Gastrointestinal: Negative for abdominal pain.  Genitourinary: Negative for dysuria and flank pain.  Musculoskeletal: Positive for back pain.  Skin: Positive for rash.  Neurological: Negative for weakness, numbness and headaches.    Allergies  Review of patient's allergies indicates no known allergies.  Home Medications   Current Outpatient Rx  Name  Route  Sig  Dispense  Refill  . doxepin (SINEQUAN) 25 MG capsule   Oral   Take 1 capsule (25 mg total) by mouth at bedtime.   15 capsule   0   . lisinopril (PRINIVIL,ZESTRIL) 20 MG tablet   Oral   Take 20 mg by mouth daily.           . QUEtiapine (SEROQUEL) 100 MG tablet   Oral   Take 100 mg by mouth at bedtime.         . sertraline (ZOLOFT) 100 MG tablet   Oral   Take 100 mg by mouth daily.           Marland Kitchen  diphenhydrAMINE (BENADRYL) 25 MG tablet   Oral   Take 1 tablet (25 mg total) by mouth every 6 (six) hours as needed for itching.   20 tablet   0    BP 168/83  Pulse 68  Temp(Src) 98.7 F (37.1 C) (Oral)  Resp 16  Wt 135 lb (61.236 kg)  SpO2 100% Physical Exam  Nursing note and vitals reviewed. Constitutional: She is oriented to person, place, and time. She appears well-developed and well-nourished.  HENT:  Head: Normocephalic and atraumatic.  Eyes: EOM are normal. Pupils are equal, round, and reactive to light.  Neck: Normal range of motion. Neck supple.  Cardiovascular: Normal rate, regular rhythm and normal heart sounds.   No murmur heard. Pulmonary/Chest: Effort normal and breath sounds normal. No respiratory distress. She has no wheezes. She has  no rales.  Abdominal: Soft. Bowel sounds are normal. She exhibits no distension. There is no tenderness. There is no rebound and no guarding.  Musculoskeletal: Normal range of motion.  Neurological: She is alert and oriented to person, place, and time. No cranial nerve deficit.  Skin: Skin is warm and dry. Rash noted.  Excoriated areas to upper chest and arms. No fluctuance.   Psychiatric: She has a normal mood and affect. Her speech is normal.    ED Course  Procedures (including critical care time) Labs Review Labs Reviewed  CBC - Abnormal; Notable for the following:    Hemoglobin 11.5 (*)    HCT 34.6 (*)    All other components within normal limits  BASIC METABOLIC PANEL - Abnormal; Notable for the following:    GFR calc non Af Amer 64 (*)    GFR calc Af Amer 74 (*)    All other components within normal limits  URINALYSIS, ROUTINE W REFLEX MICROSCOPIC - Abnormal; Notable for the following:    APPearance CLOUDY (*)    Ketones, ur 15 (*)    Leukocytes, UA TRACE (*)    All other components within normal limits  URINE MICROSCOPIC-ADD ON - Abnormal; Notable for the following:    Squamous Epithelial / LPF FEW (*)    Bacteria, UA FEW (*)    Casts HYALINE CASTS (*)    All other components within normal limits  GLUCOSE, CAPILLARY  GLUCOSE, CAPILLARY  POCT I-STAT TROPONIN I   Imaging Review No results found.  EKG Interpretation    Date/Time:  Monday November 01 2013 13:01:47 EST Ventricular Rate:  84 PR Interval:  176 QRS Duration: 82 QT Interval:  400 QTC Calculation: 472 R Axis:   23 Text Interpretation:  Normal sinus rhythm Cannot rule out Anterior infarct , age undetermined Abnormal ECG No significant change since last tracing Confirmed by Kirston Luty  MD, Davida Falconi ([id number]) on 11/01/2013 4:08:13 PM            MDM   1. Pruritic rash    Patient with multiple complaints. Rash could be allergic, or bites, or related to cocaine use. Labs and EKg reassuring. Patient was  eager to leave and left before results were given. Will follow with PCP.     Juliet Rude. Rubin Payor, MD 11/01/13 1650

## 2014-02-21 ENCOUNTER — Encounter (HOSPITAL_COMMUNITY): Payer: Self-pay | Admitting: Emergency Medicine

## 2014-02-21 ENCOUNTER — Emergency Department (INDEPENDENT_AMBULATORY_CARE_PROVIDER_SITE_OTHER)
Admission: EM | Admit: 2014-02-21 | Discharge: 2014-02-21 | Disposition: A | Discharge status: Home / Self Care | Payer: Medicaid Other | Source: Home / Self Care | Attending: Emergency Medicine | Admitting: Emergency Medicine

## 2014-02-21 DIAGNOSIS — H6120 Impacted cerumen, unspecified ear: Secondary | ICD-10-CM

## 2014-02-21 DIAGNOSIS — L309 Dermatitis, unspecified: Secondary | ICD-10-CM

## 2014-02-21 DIAGNOSIS — L259 Unspecified contact dermatitis, unspecified cause: Secondary | ICD-10-CM

## 2014-02-21 LAB — GLUCOSE, CAPILLARY: GLUCOSE-CAPILLARY: 72 mg/dL (ref 70–99)

## 2014-02-21 MED ORDER — TRIAMCINOLONE ACETONIDE 0.1 % EX CREA: 1.0000 "application " | TOPICAL_CREAM | Freq: Two times a day (BID) | CUTANEOUS | Status: DC

## 2014-02-21 NOTE — ED Notes (Signed)
C/o  Chest pain with sob x 2 days.   Denies left arm pain, nausea,vomiting.    Rash on chest x 1 month.   States out of BP and DM meds x 1 month.    Left ear soreness, pain felt with swallowing.      No otc meds taken for symptoms.   Hx of hypertension, DM.   Smoker.

## 2014-02-21 NOTE — ED Provider Notes (Signed)
CSN: [id number]     Arrival date & time 02/21/14  1138 History   First MD Initiated Contact with Patient 02/21/14 1324     Chief Complaint  Patient presents with  . Chest Pain  . Rash   (Consider location/radiation/quality/duration/timing/severity/associated sxs/prior Treatment) HPI Comments: 53 year old female presents for a variety of complaints. She ultimately decides she would like to be seen for a sensation of fullness in her left ear and inability to hear out of that ear. She would also like her blood sugar to be checked and she has a rash on her chest. The ear problem has been present for 3 days. No pain in the ear. No recent URI. She has a history of diabetes [facility] care blood sugar checked, she has nothing to check it at home. No polydipsia or polyuria, no NVD. Finally, she has a rash on her chest on and off for the past 4 months. It is itchy and she has dry skin. She does not have a primary care doctor at this time. She also mentions some chest pain over the past few days, no chest pain at this time. No chest pain today. The last time she had the pain was yesterday. She describes this as a stinging pain, nonradiating, with no associated symptoms. Denies cocaine use or any other illicit drug use. Also mentions blood pressure has been "through the roof."  Patient is a 53 y.o. female presenting with chest pain and rash.  Chest Pain Associated symptoms: no abdominal pain, no cough, no dizziness, no fever, no nausea, no palpitations, no shortness of breath, not vomiting and no weakness   Rash Associated symptoms: no abdominal pain, no fever, no joint pain, no myalgias, no nausea, no shortness of breath and not vomiting     Past Medical History  Diagnosis Date  . Depression   . Hypertension   . Pneumothorax   . Drug abuse   . Diabetes mellitus without complication    History reviewed. No pertinent past surgical history. History reviewed. No pertinent family history. History  Substance  Use Topics  . Smoking status: Current Every Day Smoker -- 0.50 packs/day  . Smokeless tobacco: Not on file  . Alcohol Use: Yes     Comment: occ   OB History   Grav Para Term Preterm Abortions TAB SAB Ect Mult Living                 Review of Systems  Constitutional: Negative for fever and chills.  HENT:       Ear fullness in the left ear  Eyes: Negative for visual disturbance.  Respiratory: Negative for cough and shortness of breath.   Cardiovascular: Positive for chest pain. Negative for palpitations and leg swelling.  Gastrointestinal: Negative for nausea, vomiting and abdominal pain.  Endocrine: Negative for polydipsia, polyphagia and polyuria.  Genitourinary: Negative for dysuria, urgency and frequency.  Musculoskeletal: Negative for arthralgias and myalgias.  Skin: Positive for rash.  Neurological: Negative for dizziness, weakness and light-headedness.    Allergies  Review of patient's allergies indicates no known allergies.  Home Medications   Current Outpatient Rx  Name  Route  Sig  Dispense  Refill  . diphenhydrAMINE (BENADRYL) 25 MG tablet   Oral   Take 1 tablet (25 mg total) by mouth every 6 (six) hours as needed for itching.   20 tablet   0   . doxepin (SINEQUAN) 25 MG capsule   Oral   Take 1 capsule (25 mg total)  by mouth at bedtime.   15 capsule   0   . lisinopril (PRINIVIL,ZESTRIL) 20 MG tablet   Oral   Take 20 mg by mouth daily.           . QUEtiapine (SEROQUEL) 100 MG tablet   Oral   Take 100 mg by mouth at bedtime.         . sertraline (ZOLOFT) 100 MG tablet   Oral   Take 100 mg by mouth daily.           Marland Kitchen triamcinolone cream (KENALOG) 0.1 %   Topical   Apply 1 application topically 2 (two) times daily.   30 g   0    BP 166/87  Pulse 71  Temp(Src) 99.1 F (37.3 C) (Oral)  Resp 20  SpO2 100% Physical Exam  Nursing note and vitals reviewed. Constitutional: She is oriented to person, place, and time. Vital signs are normal.  She appears well-developed and well-nourished. No distress.  HENT:  Head: Normocephalic and atraumatic.  Right Ear: No drainage.  Left Ear: There is drainage (cerumen impaction).  Cardiovascular: Normal rate, regular rhythm and normal heart sounds.  Exam reveals no gallop and no friction rub.   No murmur heard. Pulmonary/Chest: Effort normal and breath sounds normal. No respiratory distress. She has no wheezes. She has no rales.  Neurological: She is alert and oriented to person, place, and time. She has normal strength. Coordination normal.  Skin: Skin is warm and dry. No rash noted. She is not diaphoretic.  Excoriated, erythematous maculopapular rash on the chest and anterior neck  Psychiatric: She has a normal mood and affect. Judgment normal.    ED Course  Procedures (including critical care time) Labs Review Labs Reviewed  GLUCOSE, CAPILLARY   Imaging Review No results found.   MDM   1. Cerumen impaction   2. Dermatitis    Ear rinsed out, symptoms resolved afterwards. Will use triamcinolone cream for the rash. CBG 72. Followup with primary care, she is planning on establishing care at [facility] outpatient family [place]. Go to the emergency department call 911 if the chest pain returns  Meds ordered this encounter  Medications  . triamcinolone cream (KENALOG) 0.1 %    Sig: Apply 1 application topically 2 (two) times daily.    Dispense:  30 g    Refill:  0    Order Specific Question:  Supervising Provider    Answer:  Ihor Gully D Reader, PA-C 02/21/14 1406

## 2014-02-21 NOTE — Discharge Instructions (Signed)
Cerumen Impaction The structures of the external ear canal secrete a waxy substance known as cerumen. Excess cerumen can build up in the ear canal, causing a condition known as cerumen impaction. Cerumen impaction can cause ear pain as well as disrupt the function of the ear. The rate of cerumen production differs for each individual. For certain individuals, the configuration of one's ear canal may cause him or her to have a decreased ability to naturally remove cerumen. It is important to note that removing cerumen as a part of normal hygiene is not necessary, and the use of swabs in the ear canal is not recommended. SYMPTOMS   Diminished hearing.  Ear drainage.  Ear pain.  Ear itch. CAUSES  Excessive cerumen production.  RISK INCREASES WITH:  Frequent use of swabs to clean ears.  Narrow ear canals.  Eczema (a skin condition).  Dehydration. PREVENTION  Do Not insert objects into the ear, even with the intent of cleaning the ear.  Maintain hydration.  Control eczema if present. TREATMENT  Maintaining preventative measures is the best way to treat cerumen impaction. If symptoms of cerumen impaction develop the first step is to use over-the-counter or prescription ear drops that are intended to soften the cerumen. If the cerumen does not clear, then visit your caregiver to have the cerumen removed. The most common method for cerumen removal is through irrigation with warm water. Although, some caregivers use ear curettes and other instruments to remove the cerumen physically. In the most severe cases, cerumen may be removed surgically. Document Released: 11/25/2005 Document Revised: 02/17/2012 Document Reviewed: 03/09/2009 [facility] Patient Information 2014 [place], [place].  Contact Dermatitis Contact dermatitis is a reaction to certain substances that touch the skin. Contact dermatitis can be either irritant contact dermatitis or allergic contact dermatitis. Irritant contact  dermatitis does not require previous exposure to the substance for a reaction to occur.Allergic contact dermatitis only occurs if you have been exposed to the substance before. Upon a repeat exposure, your body reacts to the substance.  CAUSES  Many substances can cause contact dermatitis. Irritant dermatitis is most commonly caused by repeated exposure to mildly irritating substances, such as:  Makeup.  Soaps.  Detergents.  Bleaches.  Acids.  Metal salts, such as nickel. Allergic contact dermatitis is most commonly caused by exposure to:  Poisonous plants.  Chemicals (deodorants, shampoos).  Jewelry.  Latex.  Neomycin in triple antibiotic cream.  Preservatives in products, including clothing. SYMPTOMS  The area of skin that is exposed may develop:  Dryness or flaking.  Redness.  Cracks.  Itching.  Pain or a burning sensation.  Blisters. With allergic contact dermatitis, there may also be swelling in areas such as the eyelids, mouth, or genitals.  DIAGNOSIS  Your caregiver can usually tell what the problem is by doing a physical exam. In cases where the cause is uncertain and an allergic contact dermatitis is suspected, a patch skin test may be performed to help determine the cause of your dermatitis. TREATMENT Treatment includes protecting the skin from further contact with the irritating substance by avoiding that substance if possible. Barrier creams, powders, and gloves may be helpful. Your caregiver may also recommend:  Steroid creams or ointments applied 2 times daily. For best results, soak the rash area in cool water for 20 minutes. Then apply the medicine. Cover the area with a plastic wrap. You can store the steroid cream in the refrigerator for a "chilly" effect on your rash. That may decrease itching. Oral steroid medicines  may be needed in more severe cases.  Antibiotics or antibacterial ointments if a skin infection is present.  Antihistamine lotion  or an antihistamine taken by mouth to ease itching.  Lubricants to keep moisture in your skin.  Burow's solution to reduce redness and soreness or to dry a weeping rash. Mix one packet or tablet of solution in 2 cups cool water. Dip a clean washcloth in the mixture, wring it out a bit, and put it on the affected area. Leave the cloth in place for 30 minutes. Do this as often as possible throughout the day.  Taking several cornstarch or baking soda baths daily if the area is too large to cover with a washcloth. Harsh chemicals, such as alkalis or acids, can cause skin damage that is like a burn. You should flush your skin for 15 to 20 minutes with cold water after such an exposure. You should also seek immediate medical care after exposure. Bandages (dressings), antibiotics, and pain medicine may be needed for severely irritated skin.  HOME CARE INSTRUCTIONS  Avoid the substance that caused your reaction.  Keep the area of skin that is affected away from hot water, soap, sunlight, chemicals, acidic substances, or anything else that would irritate your skin.  Do not scratch the rash. Scratching may cause the rash to become infected.  You may take cool baths to help stop the itching.  Only take over-the-counter or prescription medicines as directed by your caregiver.  See your caregiver for follow-up care as directed to make sure your skin is healing properly. SEEK MEDICAL CARE IF:   Your condition is not better after 3 days of treatment.  You seem to be getting worse.  You see signs of infection such as swelling, tenderness, redness, soreness, or warmth in the affected area.  You have any problems related to your medicines. Document Released: 11/22/2000 Document Revised: 02/17/2012 Document Reviewed: 04/30/2011 [facility] Patient Information 2014 [place], [place].

## 2014-02-21 NOTE — ED Provider Notes (Signed)
Medical screening examination/treatment/procedure(s) were performed by non-physician practitioner and as supervising physician I was immediately available for consultation/collaboration.  Philipp Deputy, M.D.  Harden Mo, MD 02/21/14 [phone]

## 2014-02-21 NOTE — ED Notes (Signed)
Pt assessed.  C/o  Chest pain with mild sob x 2 days.  Hx of hypertension. Smoker.  Requesting to have blood sugar checked.  Pt out of meds x 1 month, needs refill.   Rash on chest x 1 month.  Pain left ear with swallowing.   Denies sweats or left arm pain and any other symptoms.    Pt sent back to waiting area.  Pt to inform front desk of any changes.  Mw,cma

## 2014-08-14 ENCOUNTER — Encounter (HOSPITAL_COMMUNITY): Payer: Self-pay | Admitting: Emergency Medicine

## 2014-08-14 ENCOUNTER — Emergency Department (HOSPITAL_COMMUNITY)
Admission: EM | Admit: 2014-08-14 | Discharge: 2014-08-14 | Disposition: A | Discharge status: Home / Self Care | Payer: Medicaid Other | Attending: Emergency Medicine | Admitting: Emergency Medicine

## 2014-08-14 DIAGNOSIS — F17200 Nicotine dependence, unspecified, uncomplicated: Secondary | ICD-10-CM | POA: Diagnosis not present

## 2014-08-14 DIAGNOSIS — Z8709 Personal history of other diseases of the respiratory system: Secondary | ICD-10-CM | POA: Diagnosis not present

## 2014-08-14 DIAGNOSIS — R21 Rash and other nonspecific skin eruption: Secondary | ICD-10-CM | POA: Insufficient documentation

## 2014-08-14 DIAGNOSIS — F411 Generalized anxiety disorder: Secondary | ICD-10-CM | POA: Insufficient documentation

## 2014-08-14 DIAGNOSIS — I10 Essential (primary) hypertension: Secondary | ICD-10-CM | POA: Diagnosis not present

## 2014-08-14 DIAGNOSIS — E119 Type 2 diabetes mellitus without complications: Secondary | ICD-10-CM | POA: Insufficient documentation

## 2014-08-14 DIAGNOSIS — K644 Residual hemorrhoidal skin tags: Secondary | ICD-10-CM | POA: Insufficient documentation

## 2014-08-14 LAB — OCCULT BLOOD X 1 CARD TO LAB, STOOL: Fecal Occult Bld: NEGATIVE

## 2014-08-14 MED ORDER — TRIAMCINOLONE ACETONIDE 0.1 % EX CREA: 1.0000 "application " | TOPICAL_CREAM | Freq: Two times a day (BID) | CUTANEOUS | Status: DC | PRN

## 2014-08-14 MED ORDER — HYDROCORTISONE ACETATE 25 MG RE SUPP: 25.0000 mg | Freq: Two times a day (BID) | RECTAL | Status: DC

## 2014-08-14 MED ORDER — HYDROXYZINE HCL 25 MG PO TABS: 25.0000 mg | ORAL_TABLET | Freq: Four times a day (QID) | ORAL | Status: DC | PRN

## 2014-08-14 NOTE — ED Provider Notes (Signed)
CSN: [id number]     Arrival date & time 08/14/14  1032 History   First MD Initiated Contact with Patient 08/14/14 1101     Chief Complaint  Patient presents with  . Chest Pain  . Rash     (Consider location/radiation/quality/duration/timing/severity/associated sxs/prior Treatment) HPI Comments: Rhonda Dominguez is a 53 y.o. female with a PMHx of HTN, anxiety, depression, drug abuse (cocaine), and DM2, who presents to the ED with multiple complaints, primarily complaining of an itchy rash that developed after sleeping at a friends house while her apartment was being fixed, as well as her hemorrhoids "bothering" her with some scant red blood on the tissue when she wipes.   Rash: States the rash is "everywhere", and is mildly erythematous at the bug bite sites, and itchy, but not dry or painful without any swelling or weeping from the areas. States she thinks she has bed bugs at the house she was staying at. She has not tried anything for this rash, although she proceed to ask for refills on "itch cream and itch medication" but she has been given before. She denies any fevers or induration to these areas. Denies any shortness of breath, myalgias, arthralgias, abdominal pain, nausea, vomiting, diarrhea, URI symptoms, or wheezing. Denies skin injury or IV drug use. Denies red streaking.  Rectal pain/bleeding: states she's noticed bright red blood streaked on the toilet paper when she wipes, similar to when she had hemorrhoids in the past. Has not tried anything for the symptoms, and denies any pain at rest, only when she strains occasionally. Cannot describe the pain, and says it doesn't bother her much. Denies any syncope, lightheadedness, dizziness, nose bleeds, fevers, abd pain, N/V/D, changes in color of stool, or weakness.   Of note, she complained of CP initially, but upon questioning, she says "no, my chest pain isn't bothering me, I just want something for this rash and my butt". She denies any  ongoing chest discomfort, or any SOB.  She is also requesting refills on her DM meds, her HTN meds, and her "itching meds".  Patient is a 53 y.o. female presenting with rash and hematochezia. The history is provided by the patient. No language interpreter was used.  Rash Location:  Full body Quality: itchiness and redness (mild)   Quality: not dry, not painful, not swelling and not weeping   Severity:  Moderate Onset quality:  Sudden Duration:  4 days Timing:  Constant Progression:  Unchanged Chronicity:  New Context: insect bite/sting (staying at a friends house, believes he has bed bugs)   Relieved by:  None tried Worsened by:  Nothing tried Ineffective treatments:  None tried Associated symptoms: no abdominal pain, no diarrhea, no fever, no induration, no joint pain, no myalgias, no nausea, no shortness of breath, no URI, not vomiting and not wheezing   Rectal Bleeding Quality:  Bright red Amount:  Scant Duration: unsure. Timing:  Intermittent Progression:  Unchanged Chronicity:  Recurrent Context: hemorrhoids   Similar prior episodes: yes   Relieved by:  None tried Worsened by:  Wiping (streaks of scant blood on toilet paper) Ineffective treatments:  None tried Associated symptoms: no abdominal pain, no dizziness, no epistaxis, no fever, no hematemesis, no light-headedness, no loss of consciousness, no recent illness and no vomiting     Past Medical History  Diagnosis Date  . Depression   . Hypertension   . Pneumothorax   . Drug abuse   . Diabetes mellitus without complication    History reviewed.  No pertinent past surgical history. History reviewed. No pertinent family history. History  Substance Use Topics  . Smoking status: Current Every Day Smoker -- 0.50 packs/day  . Smokeless tobacco: Not on file  . Alcohol Use: Yes     Comment: occ   OB History   Grav Para Term Preterm Abortions TAB SAB Ect Mult Living                 Review of Systems   Constitutional: Negative for fever and chills.  HENT: Negative for nosebleeds.   Respiratory: Negative for cough, shortness of breath and wheezing.   Cardiovascular: Negative for chest pain.  Gastrointestinal: Positive for hematochezia and rectal pain (intermittently with her hemorrhoids). Negative for nausea, vomiting, abdominal pain, diarrhea, blood in stool, abdominal distention, anal bleeding and hematemesis.  Genitourinary: Negative for dysuria and pelvic pain.  Musculoskeletal: Negative for arthralgias, back pain, joint swelling and myalgias.  Skin: Positive for rash.  Neurological: Negative for dizziness, loss of consciousness, syncope, weakness and light-headedness.  10 Systems reviewed and are negative for acute change except as noted in the HPI.     Allergies  Review of patient's allergies indicates no known allergies.  Home Medications   Prior to Admission medications   Medication Sig Start Date End Date Taking? Authorizing Provider  diphenhydrAMINE (BENADRYL) 25 mg capsule Take 50 mg by mouth every 6 (six) hours as needed for itching or allergies.   Yes Historical Provider, MD  Nicotine (NICODERM CQ TD) Place 1 patch onto the skin daily.   Yes Historical Provider, MD  hydrocortisone (ANUSOL-HC) 25 MG suppository Place 1 suppository (25 mg total) rectally 2 (two) times daily. For 7 days 08/14/14   Patty Sermons Camprubi-Soms, PA-C  hydrOXYzine (ATARAX/VISTARIL) 25 MG tablet Take 1 tablet (25 mg total) by mouth every 6 (six) hours as needed for itching. 08/14/14   Porchia Sinkler Strupp Camprubi-Soms, PA-C  triamcinolone cream (KENALOG) 0.1 % Apply 1 application topically 2 (two) times daily as needed. 08/14/14   Kimberli Winne Strupp Camprubi-Soms, PA-C   BP 133/83  Pulse 75  Temp(Src) 98 F (36.7 C) (Oral)  Resp 18  SpO2 99% Physical Exam  Nursing note and vitals reviewed. Constitutional: She is oriented to person, place, and time. Vital signs are normal. She appears well-developed  and well-nourished. No distress.  Afebrile, VSS, NAD  HENT:  Head: Normocephalic and atraumatic.  Mouth/Throat: Mucous membranes are normal.  Eyes: Conjunctivae and EOM are normal. Right eye exhibits no discharge. Left eye exhibits no discharge.  Neck: Normal range of motion. Neck supple.  Cardiovascular: Normal rate and intact distal pulses.   Pulmonary/Chest: Effort normal. No respiratory distress. She exhibits no tenderness.  Abdominal: Soft. Normal appearance. She exhibits no distension. There is no tenderness. There is no rigidity, no rebound and no guarding.  Genitourinary: Rectal exam shows internal hemorrhoid. Rectal exam shows no fissure, no mass, no tenderness and anal tone normal. Guaiac negative stool.  Small internal hemorrhoid at 4'o'clock position which protrudes just slightly with valsalva, and reduces spontaneously when not performing valsalva. Pt reports some discomfort with palpation but also continues talking throughout exam without obvious tenderness noted. No external hemorrhoids or fissures. No blood on glove after exam. No masses or rectal TTP. Good anal tone. FOBT neg.  Musculoskeletal: Normal range of motion.  MAE x4  Neurological: She is alert and oriented to person, place, and time. She has normal strength. No sensory deficit.  Skin: Skin is warm and dry. Rash noted.  Multiple excoriated lesions noted to arms and legs, several appear new and several healing. No surrounding erythema to any area, no warmth or swelling, no drainage. Pt not scratching during exam. Webspaces free of burrowing or lesions. No obvious track marks noted.  Psychiatric: Her mood appears anxious. Her speech is rapid and/or pressured. She is hyperactive.  Very anxious with rapid speech, hyperactive and jumps around from topic to topic, does not fully answer questions and difficult to obtain hx    ED Course  Procedures (including critical care time) Labs Review Labs Reviewed  OCCULT BLOOD X 1  CARD TO LAB, STOOL    Imaging Review No results found.   EKG Interpretation None      MDM   Final diagnoses:  Rash  Residual hemorrhoidal skin tags    53y/o female with multiple complaints. Denies ongoing CP and states she "didn't want to be seen for that anyway." Major complaint is of rectal bleeding and rash, as well as needing refills on meds since she "hasn't found a new doctor and her MCD card hasn't come in with the new doctor's name". Rectal exam noting small hemorrhoid easily reduced. Will give anusol. FOBT neg, doubt GI bleed. Will obtain CBG now. Plan to give vistaril for itching and triamcinolone cream. Upon chart review, appears pt has been seen for these same complaints in the past. HTN well controlled here, and med rec tech states she hasn't been taking them for >30 days. No DM meds on file, and CBG 97. Will not give anything for these. Rash likely bed bugs or from pt picking skin, doubt any emergent condition. Could be related to cocaine use which is documented on file but pt denied this to me. I explained the diagnosis and have given explicit precautions to return to the ER including for any other new or worsening symptoms. The patient understands and accepts the medical plan as it's been dictated and I have answered their questions. Discharge instructions concerning home care and prescriptions have been given. The patient is STABLE and is discharged to home in good condition.  BP 147/84  Pulse 55  Temp(Src) 98 F (36.7 C) (Oral)  Resp 14  SpO2 100%  Meds ordered this encounter  Medications  . hydrOXYzine (ATARAX/VISTARIL) 25 MG tablet    Sig: Take 1 tablet (25 mg total) by mouth every 6 (six) hours as needed for itching.    Dispense:  28 tablet    Refill:  0    Order Specific Question:  Supervising Provider    Answer:  Noemi Chapel D [[id number]]  . triamcinolone cream (KENALOG) 0.1 %    Sig: Apply 1 application topically 2 (two) times daily as needed.    Dispense:   30 g    Refill:  0    Order Specific Question:  Supervising Provider    Answer:  Noemi Chapel D [[id number]]  . hydrocortisone (ANUSOL-HC) 25 MG suppository    Sig: Place 1 suppository (25 mg total) rectally 2 (two) times daily. For 7 days    Dispense:  14 suppository    Refill:  0    Order Specific Question:  Supervising Provider    Answer:  Johnna Acosta [address] Camprubi-Soms, PA-C 08/14/14 1309

## 2014-08-14 NOTE — ED Provider Notes (Signed)
Medical screening examination/treatment/procedure(s) were performed by non-physician practitioner and as supervising physician I was immediately available for consultation/collaboration.   EKG Interpretation None        Hoy Morn, MD 08/14/14 2138

## 2014-08-14 NOTE — Discharge Instructions (Signed)
Take Vistaril and triamcinolone lotion as prescribed. Continue your usual home medications. Get plenty of rest and drink plenty of fluids. Avoid any known triggers. Keep areas clean and dry. Use anusol for your hemorrhoids, and warm sitz baths 4 times a day. Please followup with your primary doctor for discussion of your diagnoses and further evaluation after today's visit; if you do not have a primary care doctor use the resource guide provided to find one; return to the ER for changes or worsening in your symptoms.    Rash A rash is a change in the color or texture of your skin. There are many different types of rashes. You may have other problems that accompany your rash. CAUSES   Infections.  Allergic reactions. This can include allergies to pets or foods.  Certain medicines.  Exposure to certain chemicals, soaps, or cosmetics.  Heat.  Exposure to poisonous plants.  Tumors, both cancerous and noncancerous. SYMPTOMS   Redness.  Scaly skin.  Itchy skin.  Dry or cracked skin.  Bumps.  Blisters.  Pain. DIAGNOSIS  Your caregiver may do a physical exam to determine what type of rash you have. A skin sample (biopsy) may be taken and examined under a microscope. TREATMENT  Treatment depends on the type of rash you have. Your caregiver may prescribe certain medicines. For serious conditions, you may need to see a skin doctor (dermatologist). HOME CARE INSTRUCTIONS   Avoid the substance that caused your rash.  Do not scratch your rash. This can cause infection.  You may take cool baths to help stop itching.  Only take over-the-counter or prescription medicines as directed by your caregiver.  Keep all follow-up appointments as directed by your caregiver. SEEK IMMEDIATE MEDICAL CARE IF:  You have increasing pain, swelling, or redness.  You have a fever.  You have new or severe symptoms.  You have body aches, diarrhea, or vomiting.  Your rash is not better after 3  days. MAKE SURE YOU:  Understand these instructions.  Will watch your condition.  Will get help right away if you are not doing well or get worse. Document Released: 11/15/2002 Document Revised: 02/17/2012 Document Reviewed: 09/09/2011 [facility] Patient Information 2015 [place], [place]. This information is not intended to replace advice given to you by your health care provider. Make sure you discuss any questions you have with your health care provider.  Hemorrhoids Hemorrhoids are puffy (swollen) veins around the rectum or anus. Hemorrhoids can cause pain, itching, bleeding, or irritation. HOME CARE  Eat foods with fiber, such as whole grains, beans, nuts, fruits, and vegetables. Ask your doctor about taking products with added fiber in them (fibersupplements).  Drink enough fluid to keep your pee (urine) clear or pale yellow.  Exercise often.  Go to the bathroom when you have the urge to poop. Do not wait.  Avoid straining to poop (bowel movement).  Keep the butt area dry and clean. Use wet toilet paper or moist paper towels.  Medicated creams and medicine inserted into the anus (anal suppository) may be used or applied as told.  Only take medicine as told by your doctor.  Take a warm water bath (sitz bath) for 15-20 minutes to ease pain. Do this 3-4 times a day.  Place ice packs on the area if it is tender or puffy. Use the ice packs between the warm water baths.  Put ice in a plastic bag.  Place a towel between your skin and the bag.  Leave the ice on  for 15-20 minutes, 03-04 times a day.  Do not use a donut-shaped pillow or sit on the toilet for a long time. GET HELP RIGHT AWAY IF:   You have more pain that is not controlled by treatment or medicine.  You have bleeding that will not stop.  You have trouble or are unable to poop (bowel movement).  You have pain or puffiness outside the area of the hemorrhoids. MAKE SURE YOU:   Understand these  instructions.  Will watch your condition.  Will get help right away if you are not doing well or get worse. Document Released: 09/03/2008 Document Revised: 11/11/2012 Document Reviewed: 10/06/2012 [facility] Patient Information 2015 [place], [place]. This information is not intended to replace advice given to you by your health care provider. Make sure you discuss any questions you have with your health care provider.

## 2014-08-14 NOTE — ED Notes (Signed)
Pt alert and oriented at discharge.  Pt walked to the waiting room by this RN.

## 2014-08-14 NOTE — ED Notes (Signed)
Pt has multiple complaints, has left side chest pains. Has been out of diabetes meds x 1 week. Having hemorrhoids and rectal bleeding. And has a rash that she thinks is bed bug.

## 2014-08-16 LAB — CBG MONITORING, ED: Glucose-Capillary: 97 mg/dL (ref 70–99)

## 2014-10-11 ENCOUNTER — Encounter: Payer: Self-pay | Admitting: Family Medicine

## 2014-10-11 ENCOUNTER — Ambulatory Visit (HOSPITAL_BASED_OUTPATIENT_CLINIC_OR_DEPARTMENT_OTHER): Payer: Medicaid Other | Admitting: Emergency Medicine

## 2014-10-11 ENCOUNTER — Ambulatory Visit (INDEPENDENT_AMBULATORY_CARE_PROVIDER_SITE_OTHER): Payer: Medicaid Other | Admitting: Family Medicine

## 2014-10-11 VITALS — BP 159/85 | HR 67 | Temp 98.1°F | Resp 18 | Ht 65.95 in | Wt 145.0 lb

## 2014-10-11 DIAGNOSIS — E1165 Type 2 diabetes mellitus with hyperglycemia: Secondary | ICD-10-CM

## 2014-10-11 DIAGNOSIS — IMO0002 Reserved for concepts with insufficient information to code with codable children: Secondary | ICD-10-CM

## 2014-10-11 DIAGNOSIS — Z23 Encounter for immunization: Secondary | ICD-10-CM

## 2014-10-11 DIAGNOSIS — I10 Essential (primary) hypertension: Secondary | ICD-10-CM

## 2014-10-11 DIAGNOSIS — E119 Type 2 diabetes mellitus without complications: Secondary | ICD-10-CM

## 2014-10-11 DIAGNOSIS — Z0000 Encounter for general adult medical examination without abnormal findings: Secondary | ICD-10-CM

## 2014-10-11 LAB — CBC WITH DIFFERENTIAL/PLATELET
BASOS ABS: 0 10*3/uL (ref 0.0–0.1)
Basophils Relative: 0 % (ref 0–1)
EOS ABS: 0.1 10*3/uL (ref 0.0–0.7)
Eosinophils Relative: 2 % (ref 0–5)
HCT: 37.1 % (ref 36.0–46.0)
HEMOGLOBIN: 11.8 g/dL — AB (ref 12.0–15.0)
Lymphocytes Relative: 43 % (ref 12–46)
Lymphs Abs: 3 10*3/uL (ref 0.7–4.0)
MCH: 27.6 pg (ref 26.0–34.0)
MCHC: 31.8 g/dL (ref 30.0–36.0)
MCV: 86.7 fL (ref 78.0–100.0)
MONOS PCT: 6 % (ref 3–12)
Monocytes Absolute: 0.4 10*3/uL (ref 0.1–1.0)
NEUTROS ABS: 3.4 10*3/uL (ref 1.7–7.7)
Neutrophils Relative %: 49 % (ref 43–77)
PLATELETS: 236 10*3/uL (ref 150–400)
RBC: 4.28 MIL/uL (ref 3.87–5.11)
RDW: 14.7 % (ref 11.5–15.5)
WBC: 6.9 10*3/uL (ref 4.0–10.5)

## 2014-10-11 LAB — POCT GLYCOSYLATED HEMOGLOBIN (HGB A1C): Hemoglobin A1C: 5.7

## 2014-10-11 MED ORDER — DIPHENHYDRAMINE HCL 25 MG PO CAPS: 50.0000 mg | ORAL_CAPSULE | Freq: Four times a day (QID) | ORAL | Status: DC | PRN

## 2014-10-11 MED ORDER — TRIAMCINOLONE ACETONIDE 0.1 % EX CREA: 1.0000 "application " | TOPICAL_CREAM | Freq: Two times a day (BID) | CUTANEOUS | Status: DC | PRN

## 2014-10-11 MED ORDER — HYDROCHLOROTHIAZIDE 25 MG PO TABS: 25.0000 mg | ORAL_TABLET | Freq: Every day | ORAL | Status: DC

## 2014-10-11 NOTE — Patient Instructions (Signed)
Please make an appointment to be seen with in 4 weeks to have PAP smear completed and diabetes FU  Keeping You Healthy  Get These Tests  Blood Pressure- Have your blood pressure checked by your healthcare provider at least once a year.  Normal blood pressure is 120/80.  Weight- Have your body mass index (BMI) calculated to screen for obesity.  BMI is a measure of body fat based on height and weight.  You can calculate your own BMI at [employer].it  Cholesterol- Have your cholesterol checked every year.  Diabetes- Have your blood sugar checked every year if you have high blood pressure, high cholesterol, a family history of diabetes or if you are overweight.  Pap Smear- Have a pap smear every 1 to 3 years if you have been sexually active.  If you are older than 65 and recent pap smears have been normal you may not need additional pap smears.  In addition, if you have had a hysterectomy  For benign disease additional pap smears are not necessary.  Mammogram-Yearly mammograms are essential for early detection of breast cancer  Screening for Colon Cancer- Colonoscopy starting at age 88. Screening may begin sooner depending on your family history and other health conditions.  Follow up colonoscopy as directed by your Gastroenterologist.  Screening for Osteoporosis- Screening begins at age 78 with bone density scanning, sooner if you are at higher risk for developing Osteoporosis.  Get these medicines  Calcium with Vitamin D- Your body requires 1200-1500 mg of Calcium a day and [phone] IU of Vitamin D a day.  You can only absorb 500 mg of Calcium at a time therefore Calcium must be taken in 2 or 3 separate doses throughout the day.  Hormones- Hormone therapy has been associated with increased risk for certain cancers and heart disease.  Talk to your healthcare provider about if you need relief from menopausal symptoms.  Aspirin- Ask your healthcare provider about taking Aspirin to  prevent Heart Disease and Stroke.  Get these Immuniztions  Flu shot- Every fall  Pneumonia shot- Once after the age of 43; if you are younger ask your healthcare provider if you need a pneumonia shot.  Tetanus- Every ten years.  Zostavax- Once after the age of 9 to prevent shingles.  Take these steps  Don't smoke- Your healthcare provider can help you quit. For tips on how to quit, ask your healthcare provider or go to www.smokefree.gov or call 1-800 QUIT-NOW.  Be physically active- Exercise 5 days a week for a minimum of 30 minutes.  If you are not already physically active, start slow and gradually work up to 30 minutes of moderate physical activity.  Try walking, dancing, bike riding, swimming, etc.  Eat a healthy diet- Eat a variety of healthy foods such as fruits, vegetables, whole grains, low fat milk, low fat cheeses, yogurt, lean meats, chicken, fish, eggs, dried beans, tofu, etc.  For more information go to www.thenutritionsource.org  Dental visit- Brush and floss teeth twice daily; visit your dentist twice a year.  Eye exam- Visit your Optometrist or Ophthalmologist yearly.  Drink alcohol in moderation- Limit alcohol intake to one drink or less a day.  Never drink and drive.  Depression- Your emotional health is as important as your physical health.  If you're feeling down or losing interest in things you normally enjoy, please talk to your healthcare provider.  Seat Belts- can save your life; always wear one  Smoke/Carbon Monoxide detectors- These detectors need to be  installed on the appropriate level of your home.  Replace batteries at least once a year.  Violence- If anyone is threatening or hurting you, please tell your healthcare provider.  Living Will/ Health care power of attorney- Discuss with your healthcare provider and family.

## 2014-10-11 NOTE — Assessment & Plan Note (Signed)
Patient received flu shot today. She is overdue for colonoscopy, mammogram, tetanus and pneumonia vaccination. These will be addressed on her follow-up appointment when she has her Pap smear.

## 2014-10-11 NOTE — Progress Notes (Signed)
Subjective:    Patient ID: Rhonda Dominguez, female    DOB: Apr 18, 1961, 54 y.o.   MRN: [id number]  HPI Rhonda Dominguez is a 53 y.o. female presents to [facility] for establishment of primary care.   Patient has multiple complaints to review. Reminded patient this is a new patient establishment and we will address her chronic issues, the remainder she will have to make a separate appointment to address.  Diabetes: patient states she has a history of diabetes, she is unable to tell me what medication she has been taking. She has a glucose monitor with her today that only has 3 recorded CBGs from September, which were 99-105. There is not an A1c for her system. She states she's been out of medications for her diabetes for at least 6 months. She denies any hypo-or hyperglycemic events. She denies any neuropathy at this time. She denies any nonhealing foot wounds at this time.  Fecal and urinary incontinence: Patient is not able to give information on why she has fecal and urinary incontinence. She is prescribed a nurse in the home, I have spoken with her today via telephone. It appears that the patient receives home care services 7 days a week, with medication assistance. She was prescribed pull-ups in bed pads for her incontinence. The nurse was able to provide me with some ICD-9 diagnostic codes that may be helpful in figuring out why this patient is experiencing incontinence.  Hypertension: patient states she has a history of hypertension, but is unable to tell me what medication she has been prescribed. She denies any chest pain, palpitations, dizziness or syncope.   Well woman: patient reports she no longer has menstrual cycles. She has had her tubes tied in the past. She had a mammogram in 2008 that was normal. She states her mother has breast cancer now at age 38. She has never had a colonoscopy. She states her brother died of colon cancer, and she believes he was diagnosed approximately 54 years  old. Patient is due for flu shot, tetanus and Pneumovax.  She is a current smoker, She has a history of substance abuse. Patient is single, appears to be disabled, lives with herself but has nursing assistance, requires transportation's assistant's with another friend or public transportation, she does not exercise regularly, she smokes "sometimes ", she does drink beer 1 times a day. She does feel safe in her relationships, and she is not depressed.  Of note, home health nurse states that patient is on portable oxygen as needed, for unknown reasons that patient and nurse can't provide. Of note patient states that she is bipolar, has diabetes, acid reflux, high blood pressure,  high cholesterol, irregular heartbeat, migraines, and sleep apnea. She then later admits to fecal and urinary incontinence.The ICD 9 codes provided by nurse correlated with hypertension, hyperlipidemia, anxiety, major depressive disorder, asthma and rheumatoid arthritis.an additional records show psychiatric admission.  Past Medical History  Diagnosis Date  . Depression   . Hypertension   . Pneumothorax   . Drug abuse   . Diabetes mellitus without complication     Surgeries: she admits to tubal ligation, old CT shows she has had a hysterectomy.  Her only allergies is to pollen. Review of Systems Per HPI    Objective:   Physical Exam BP 159/85 mmHg  Pulse 67  Temp(Src) 98.1 F (36.7 C) (Oral)  Resp 18  Ht 5' 5.95" (1.675 m)  Wt 145 lb (65.772 kg)  BMI 23.44 kg/m2  SpO2 100% Gen: talkative, agitated, anxious, demanding African-American female. Acute distress, nontoxic in appearance, well-developed, well-nourished. HEENT: AT. [place]. left TM visualized normal, right ear with cerumen impaction. Bilateral eyes without injections or icterus. MMM. Bilateral nares with mild erythema and swelling. Throat without erythema or exudates.  CV: RRR no murmur appreciated Chest: CTAB, no wheeze or crackles Abd: Soft. Lat.  NTND. BS present. no Masses palpated.  Ext: No erythema. No edema. +2/4 PT/DP. Skin: multiple excoriated questionable insect bites bilateral forearms and shins. Neuro: Normal gait. PERLA. EOMi. Alert. Grossly intact.  Psych: talkative, agitated, anxious with psychomotor agitation. Tangential speech. Pressure speech. Needed a redirected multiple times.    Assessment & Plan:

## 2014-10-11 NOTE — Assessment & Plan Note (Addendum)
Patient is an extremely poor historian. Patient with elevated blood pressure today in office. I have called [facility] personally, to see what her prescription had been in the past. It appears she was prescribed HCTZ 25 mg daily and at one point lisinopril 20 mg daily.she has been without medications for almost a year according to the pharmacist. Goal blood pressure is below 130/80 considering she is a reported diabetic. CBC, CMP, A1c lipids obtained today. HCTZ 25 mg prescribed, patient will need to come back for nurse check in one week. I personally called patient and left a message on her voicemail explaining medications and follow-ups.

## 2014-10-11 NOTE — Assessment & Plan Note (Addendum)
Appears pt has been prescribed metformin 500 mg once daily. A1c, CMP and CBC today >>>A1c result 5.7. Will call patient with results and explained she should continue to watch her diet, but she does not need metformin prescription.

## 2014-10-12 ENCOUNTER — Encounter: Payer: Self-pay | Admitting: Family Medicine

## 2014-10-12 LAB — COMPLETE METABOLIC PANEL WITH GFR
ALT: 12 U/L (ref 0–35)
AST: 16 U/L (ref 0–37)
Albumin: 4 g/dL (ref 3.5–5.2)
Alkaline Phosphatase: 100 U/L (ref 39–117)
BILIRUBIN TOTAL: 0.2 mg/dL (ref 0.2–1.2)
BUN: 20 mg/dL (ref 6–23)
CALCIUM: 9.7 mg/dL (ref 8.4–10.5)
CHLORIDE: 104 meq/L (ref 96–112)
CO2: 25 meq/L (ref 19–32)
CREATININE: 0.97 mg/dL (ref 0.50–1.10)
GFR, EST NON AFRICAN AMERICAN: 66 mL/min
GFR, Est African American: 77 mL/min
GLUCOSE: 80 mg/dL (ref 70–99)
Potassium: 3.9 mEq/L (ref 3.5–5.3)
Sodium: 139 mEq/L (ref 135–145)
Total Protein: 7.1 g/dL (ref 6.0–8.3)

## 2014-10-12 LAB — LIPID PANEL
CHOLESTEROL: 163 mg/dL (ref 0–200)
HDL: 87 mg/dL (ref >39)
LDL Cholesterol: 46 mg/dL (ref 0–99)
Total CHOL/HDL Ratio: 1.9 Ratio
Triglycerides: 151 mg/dL — ABNORMAL HIGH (ref <150)
VLDL: 30 mg/dL (ref 0–40)

## 2014-12-06 ENCOUNTER — Encounter (HOSPITAL_COMMUNITY): Payer: Self-pay | Admitting: Emergency Medicine

## 2014-12-06 ENCOUNTER — Emergency Department (HOSPITAL_COMMUNITY): Payer: Medicaid Other

## 2014-12-06 ENCOUNTER — Emergency Department (HOSPITAL_COMMUNITY)
Admission: EM | Admit: 2014-12-06 | Discharge: 2014-12-06 | Disposition: A | Discharge status: Home / Self Care | Payer: Medicaid Other | Attending: Emergency Medicine | Admitting: Emergency Medicine

## 2014-12-06 DIAGNOSIS — Z79899 Other long term (current) drug therapy: Secondary | ICD-10-CM | POA: Diagnosis not present

## 2014-12-06 DIAGNOSIS — Y998 Other external cause status: Secondary | ICD-10-CM | POA: Diagnosis not present

## 2014-12-06 DIAGNOSIS — Y9289 Other specified places as the place of occurrence of the external cause: Secondary | ICD-10-CM | POA: Insufficient documentation

## 2014-12-06 DIAGNOSIS — E119 Type 2 diabetes mellitus without complications: Secondary | ICD-10-CM | POA: Diagnosis not present

## 2014-12-06 DIAGNOSIS — W208XXA Other cause of strike by thrown, projected or falling object, initial encounter: Secondary | ICD-10-CM | POA: Diagnosis not present

## 2014-12-06 DIAGNOSIS — Y9389 Activity, other specified: Secondary | ICD-10-CM | POA: Insufficient documentation

## 2014-12-06 DIAGNOSIS — Z720 Tobacco use: Secondary | ICD-10-CM | POA: Diagnosis not present

## 2014-12-06 DIAGNOSIS — M79671 Pain in right foot: Secondary | ICD-10-CM

## 2014-12-06 DIAGNOSIS — I10 Essential (primary) hypertension: Secondary | ICD-10-CM | POA: Diagnosis not present

## 2014-12-06 DIAGNOSIS — T148XXA Other injury of unspecified body region, initial encounter: Secondary | ICD-10-CM

## 2014-12-06 DIAGNOSIS — S9031XA Contusion of right foot, initial encounter: Secondary | ICD-10-CM | POA: Insufficient documentation

## 2014-12-06 DIAGNOSIS — Z8659 Personal history of other mental and behavioral disorders: Secondary | ICD-10-CM | POA: Diagnosis not present

## 2014-12-06 DIAGNOSIS — S99921A Unspecified injury of right foot, initial encounter: Secondary | ICD-10-CM | POA: Diagnosis present

## 2014-12-06 MED ORDER — HYDROCODONE-ACETAMINOPHEN 5-325 MG PO TABS
1.0000 | ORAL_TABLET | Freq: Once | ORAL | Status: AC
  Administered 2014-12-06: 1 via ORAL
  Filled 2014-12-06: qty 1

## 2014-12-06 MED ORDER — NAPROXEN 500 MG PO TABS: 500.0000 mg | ORAL_TABLET | Freq: Two times a day (BID) | ORAL | Status: DC | PRN

## 2014-12-06 MED ORDER — HYDROCODONE-ACETAMINOPHEN 5-325 MG PO TABS: 1.0000 | ORAL_TABLET | Freq: Four times a day (QID) | ORAL | Status: DC | PRN

## 2014-12-06 NOTE — Discharge Instructions (Signed)
Wear post op shoe and use crutches as needed for comfort. Ice and elevate foot throughout the day. Alternate between naprosyn and vicodin for pain relief. Do not drive or operate machinery with pain medication use. Call orthopedic follow up today or tomorrow to schedule followup appointment for recheck of ongoing foot pain that can be canceled with a 24-48 hour notice if complete resolution of pain. Return to the ER for changes or worsening symptoms. See your regular doctor for your chronic medical conditions.   Contusion A contusion is a deep bruise. Contusions are the result of an injury that caused bleeding under the skin. The contusion may turn blue, purple, or yellow. Minor injuries will give you a painless contusion, but more severe contusions may stay painful and swollen for a few weeks.  CAUSES  A contusion is usually caused by a blow, trauma, or direct force to an area of the body. SYMPTOMS   Swelling and redness of the injured area.  Bruising of the injured area.  Tenderness and soreness of the injured area.  Pain. DIAGNOSIS  The diagnosis can be made by taking a history and physical exam. An X-ray, CT scan, or MRI may be needed to determine if there were any associated injuries, such as fractures. TREATMENT  Specific treatment will depend on what area of the body was injured. In general, the best treatment for a contusion is resting, icing, elevating, and applying cold compresses to the injured area. Over-the-counter medicines may also be recommended for pain control. Ask your caregiver what the best treatment is for your contusion. HOME CARE INSTRUCTIONS   Put ice on the injured area.  Put ice in a plastic bag.  Place a towel between your skin and the bag.  Leave the ice on for 15-20 minutes, 3-4 times a day, or as directed by your health care provider.  Only take over-the-counter or prescription medicines for pain, discomfort, or fever as directed by your caregiver. Your  caregiver may recommend avoiding anti-inflammatory medicines (aspirin, ibuprofen, and naproxen) for 48 hours because these medicines may increase bruising.  Rest the injured area.  If possible, elevate the injured area to reduce swelling. SEEK IMMEDIATE MEDICAL CARE IF:   You have increased bruising or swelling.  You have pain that is getting worse.  Your swelling or pain is not relieved with medicines. MAKE SURE YOU:   Understand these instructions.  Will watch your condition.  Will get help right away if you are not doing well or get worse. Document Released: 09/04/2005 Document Revised: 11/30/2013 Document Reviewed: 09/30/2011 [facility] Patient Information 2015 [place], [place]. This information is not intended to replace advice given to you by your health care provider. Make sure you discuss any questions you have with your health care provider.

## 2014-12-06 NOTE — ED Provider Notes (Signed)
CSN: [id number]     Arrival date & time 12/06/14  0930 History  This chart was scribed for non-physician practitioner, Zacarias Pontes, PA-C working with Wandra Arthurs, MD by Frederich Balding, ED scribe. This patient was seen in room TR05C/TR05C and the patient's care was started at 9:43 AM.   Chief Complaint  Patient presents with  . Foot Pain   Patient is a 53 y.o. female presenting with lower extremity pain. The history is provided by the patient. No language interpreter was used.  Foot Pain This is a new problem. The current episode started more than 2 days ago. The problem occurs constantly. The problem has been gradually worsening. Exacerbated by: bearing weight, pressure to top of foot. Nothing relieves the symptoms. She has tried nothing for the symptoms.    HPI Comments: Rhonda Dominguez is a 53 y.o. female who presents to the Emergency Department complaining of right foot injury that occurred one week ago. States she dropped a brick onto the top of her foot over her great toe at the MTP joint. Reports sudden onset, worsening pain with associated swelling and intermittent tingling. Rates pain 10/10 and describes it as sharp and constant. Bearing weight and pressure to the top of her foot worsens the pain. She has not done or taken anything for her symptoms. Denies leg swelling, redness, warmth to touch, numbness, weakness, or fevers. Denies history of gout.   Past Medical History  Diagnosis Date  . Depression   . Hypertension   . Pneumothorax   . Drug abuse   . Diabetes mellitus without complication    History reviewed. No pertinent past surgical history. History reviewed. No pertinent family history. History  Substance Use Topics  . Smoking status: Current Every Day Smoker -- 0.50 packs/day  . Smokeless tobacco: Not on file  . Alcohol Use: Yes     Comment: occ   OB History    No data available     Review of Systems  Cardiovascular: Negative for leg swelling.   Musculoskeletal: Positive for joint swelling and arthralgias.  Skin: Negative for color change.  Neurological: Negative for numbness.  10 systems reviewed and are negative for acute changes except as noted in the HPI.  Allergies  Review of patient's allergies indicates no known allergies.  Home Medications   Prior to Admission medications   Medication Sig Start Date End Date Taking? Authorizing Provider  diphenhydrAMINE (BENADRYL) 25 mg capsule Take 2 capsules (50 mg total) by mouth every 6 (six) hours as needed for itching or allergies. 10/11/14   Renee A Kuneff, DO  hydrochlorothiazide (HYDRODIURIL) 25 MG tablet Take 1 tablet (25 mg total) by mouth daily. 10/11/14   Renee A Kuneff, DO  hydrocortisone (ANUSOL-HC) 25 MG suppository Place 1 suppository (25 mg total) rectally 2 (two) times daily. For 7 days 08/14/14   Patty Sermons Camprubi-Soms, PA-C  hydrOXYzine (ATARAX/VISTARIL) 25 MG tablet Take 1 tablet (25 mg total) by mouth every 6 (six) hours as needed for itching. 08/14/14   Tatiyanna Lashley Strupp Camprubi-Soms, PA-C  Nicotine (NICODERM CQ TD) Place 1 patch onto the skin daily.    Historical Provider, MD  triamcinolone cream (KENALOG) 0.1 % Apply 1 application topically 2 (two) times daily as needed. 10/11/14   Renee A Kuneff, DO   BP 138/86 mmHg  Pulse 73  Temp(Src) 98 F (36.7 C) (Oral)  Resp 16  Wt 143 lb 12.8 oz (65.227 kg)  SpO2 99%   Physical Exam  Constitutional: She  is oriented to person, place, and time. Vital signs are normal. She appears well-developed and well-nourished.  Non-toxic appearance. No distress.  Afebrile, nontoxic, NAD  HENT:  Head: Normocephalic and atraumatic.  Mouth/Throat: Mucous membranes are normal.  Eyes: Conjunctivae and EOM are normal.  Neck: Neck supple.  Cardiovascular: Normal rate and intact distal pulses.   Distal pulses intact  Pulmonary/Chest: Effort normal. No respiratory distress.  Abdominal: Normal appearance. She exhibits no distension.   Musculoskeletal: Normal range of motion.       Right foot: There is tenderness and swelling. There is normal range of motion, normal capillary refill, no deformity and no laceration.       Feet:  Right ankle with full ROM intact and no bony tenderness to palpation. First MTP joint of R foot with contusion and hematoma over dorsal aspect, with tenderness to palpation diffusely within this joint. No erythema or warmth. Sensation grossly intact. Strength 5/5. Wiggles all digits. Cap refill brisk and present.  Neurological: She is alert and oriented to person, place, and time. She has normal strength. No sensory deficit.  Skin: Skin is warm, dry and intact. No erythema.  Psychiatric: She has a normal mood and affect. Her behavior is normal.  Nursing note and vitals reviewed.   ED Course  Procedures (including critical care time)  DIAGNOSTIC STUDIES: Oxygen Saturation is 99% on RA, normal by my interpretation.    COORDINATION OF CARE: 9:47 AM-Discussed treatment plan which includes foot xray with pt at bedside and pt agreed to plan.   Labs Review Labs Reviewed - No data to display  Imaging Review Dg Foot Complete Right  12/06/2014   CLINICAL DATA:  Dropped break on right foot. Pain and bruising first MTP joint  EXAM: RIGHT FOOT COMPLETE - 3+ VIEW  COMPARISON:  12/02/2008  FINDINGS: Mildly progressive moderate degenerative changes at the first MTP joint with spurring and joint space narrowing. No acute bony abnormality. Specifically, no fracture, subluxation, or dislocation. Soft tissues are intact.  IMPRESSION: No acute bony abnormality.   Electronically Signed   By: Rolm Baptise M.D.   On: 12/06/2014 10:05     EKG Interpretation None      MDM   Final diagnoses:  Foot pain, right  Contusion    53 y.o. female with contusion to right first MTP joint. Doubt septic joint or gout given no erythema or warmth. X-ray obtained which was negative for any acute changes, but did find mild  progressive moderate degenerative changes at the first MTP. Place patient in postop shoe and given crutches for comfort. Given pain medication. Discussed follow-up with ortho in 1 week for ongoing pain. Discuss primary care follow-up for chronic medical conditions. I explained the diagnosis and have given explicit precautions to return to the ER including for any other new or worsening symptoms. The patient understands and accepts the medical plan as it's been dictated and I have answered their questions. Discharge instructions concerning home care and prescriptions have been given. The patient is STABLE and is discharged to home in good condition.  BP 138/86 mmHg  Pulse 73  Temp(Src) 98 F (36.7 C) (Oral)  Resp 16  Wt 143 lb 12.8 oz (65.227 kg)  SpO2 99%  Meds ordered this encounter  Medications  . HYDROcodone-acetaminophen (NORCO/VICODIN) 5-325 MG per tablet 1 tablet    Sig:   . HYDROcodone-acetaminophen (NORCO) 5-325 MG per tablet    Sig: Take 1-2 tablets by mouth every 6 (six) hours as needed for  severe pain.    Dispense:  10 tablet    Refill:  0    Order Specific Question:  Supervising Provider    Answer:  Noemi Chapel D [[id number]]  . naproxen (NAPROSYN) 500 MG tablet    Sig: Take 1 tablet (500 mg total) by mouth 2 (two) times daily as needed for mild pain, moderate pain or headache (TAKE WITH MEALS.).    Dispense:  20 tablet    Refill:  0    Order Specific Question:  Supervising Provider    Answer:  Noemi Chapel D [[id number]]     I personally performed the services described in this documentation, which was scribed in my presence. The recorded information has been reviewed and is accurate.  Patty Sermons [place], [place] 12/06/14 [place] Yao, MD 12/06/14 1659

## 2014-12-06 NOTE — ED Notes (Signed)
Pt c/o right foot pain after dropping brick on foot 1 week ago; CMS intact

## 2014-12-19 ENCOUNTER — Telehealth: Payer: Self-pay | Admitting: Family Medicine

## 2014-12-19 NOTE — Telephone Encounter (Signed)
Pt called because [facility] would like to speak to Dr. Raoul Pitch. The patient is having surgery on 12/26/14 and they need to ask the doctor some questions. Please call them at [phone]. jw

## 2015-01-13 ENCOUNTER — Encounter: Payer: Self-pay | Admitting: Family Medicine

## 2015-01-13 ENCOUNTER — Ambulatory Visit (INDEPENDENT_AMBULATORY_CARE_PROVIDER_SITE_OTHER): Payer: Medicaid Other | Admitting: Family Medicine

## 2015-01-13 VITALS — BP 157/102 | HR 93 | Temp 98.2°F | Ht 66.0 in | Wt 149.0 lb

## 2015-01-13 DIAGNOSIS — M2010 Hallux valgus (acquired), unspecified foot: Secondary | ICD-10-CM | POA: Insufficient documentation

## 2015-01-13 DIAGNOSIS — M21619 Bunion of unspecified foot: Secondary | ICD-10-CM | POA: Insufficient documentation

## 2015-01-13 DIAGNOSIS — I10 Essential (primary) hypertension: Secondary | ICD-10-CM

## 2015-01-13 DIAGNOSIS — M2011 Hallux valgus (acquired), right foot: Secondary | ICD-10-CM

## 2015-01-13 DIAGNOSIS — M21611 Bunion of right foot: Secondary | ICD-10-CM

## 2015-01-13 DIAGNOSIS — L309 Dermatitis, unspecified: Secondary | ICD-10-CM

## 2015-01-13 MED ORDER — TRIAMCINOLONE ACETONIDE 0.1 % EX CREA: 1.0000 "application " | TOPICAL_CREAM | Freq: Two times a day (BID) | CUTANEOUS | Status: DC | PRN

## 2015-01-13 MED ORDER — HYDROXYZINE HCL 25 MG PO TABS: 25.0000 mg | ORAL_TABLET | Freq: Four times a day (QID) | ORAL | Status: DC | PRN

## 2015-01-13 MED ORDER — HYDROCHLOROTHIAZIDE 25 MG PO TABS: 25.0000 mg | ORAL_TABLET | Freq: Every day | ORAL | Status: DC

## 2015-01-13 NOTE — Progress Notes (Signed)
   Subjective:    Patient ID: Rhonda Dominguez, female    DOB: [birth year], 54 y.o.   MRN: [id number]  HPI  Right large toe pain: Patient presents back to family medicine clinic for right large toe pain after being referred to orthopedics. Patient has bunion formation at the first metatarsal head. Orthopedics recommends that she be seen by podiatry and has a scaphoid pad placements. He states that her pain is severe and she can't wear any of her nice church shoes. She was advised to wear a walking boot and crutches from orthopedics, which she admittedly states she will not do. She has tried naproxen for the pain, and has been attempting to wear more comfortable shoes only. She states does not help much, and she wants to be able to wear her other shoes.  Hypertension: Patient reports her blood pressure has been elevated, could she has been out of her medications for at least a month. She states she went to the pharmacy, but they told her that I do not call in or request the prescriptions. He did not seek help in getting her prescriptions, and she did not call the office to request refills on her prescriptions. He denies chest pain, shortness of breath, headache, dizziness or visual changes.  Rash: Patient has a diagnosis of eczema, she has been out of her triamcinolone cream. She states that the cream without help and when she did have it. She admits to not using it regularly.   Non smoker Past Medical History  Diagnosis Date  . Depression   . Hypertension   . Pneumothorax   . Drug abuse   . Diabetes mellitus without complication    No Known Allergies   Review of Systems Per hPI    Objective:   Physical Exam BP 157/102 mmHg  Pulse 93  Temp(Src) 98.2 F (36.8 C) (Oral)  Ht 5\' 6"  (1.676 m)  Wt 149 lb (67.586 kg)  BMI 24.06 kg/m2 Gen: Tangential, African-American female, no acute distress, nontoxic in appearance, well-developed, well-nourished, alert and oriented HEENT: AT. [place].  Bilateral eyes without injections or icterus. MMM.  Ext: Mild erythema over the first metatarsal of right foot. Moderate tenderness to palpation. No open wounds or drainage. Very mild hallux valgus. Neurovascularly intact distally Skin: Many excoriated, dry scaly patchy lesions over her forearms.    Assessment & Plan:

## 2015-01-13 NOTE — Assessment & Plan Note (Signed)
Triamcinolone cream refilled today. Vistaril refill 1. Patient to use cream as directed for 2-4 weeks, and then to make an appointment to be evaluated for improvement. If not improved can try higher dose steroid, or dermatology referral.

## 2015-01-13 NOTE — Assessment & Plan Note (Signed)
Patient with mild hallux with bunions, referred to podiatry. Advised patient to wear comfortable shoes, and encouraged her to wear the crutches and boot to orthopedics have provided her. Continue take naproxen for the pain.

## 2015-01-13 NOTE — Assessment & Plan Note (Signed)
Patient has been out of blood pressure medicines for some time, refilled on blood pressure medicines today. Patient advised to follow-up in 2 weeks for blood pressure check.

## 2015-01-13 NOTE — Patient Instructions (Signed)
Bunion (Hallux Valgus) A bony bump (protrusion) on the inside of the foot, at the base of the first toe, is called a bunion (hallux valgus). A bunion causes the first toe to angle toward the other toes. SYMPTOMS   A bony bump on the inside of the foot, causing an outward turning of the first toe. It may also overlap the second toe.  Thickening of the skin (callus) over the bony bump.  Fluid buildup under the callus. Fluid may become red, tender, and swollen (inflamed) with constant irritation or pressure.  Foot pain and stiffness. CAUSES  Many causes exist, including:  Inherited from your family (genetics).  Injury (trauma) forcing the first toe into a position in which it overlaps other toes.  Bunions are also associated with wearing shoes that have a narrow toe box (pointy shoes). RISK INCREASES WITH:  Family history of foot abnormalities, especially bunions.  Arthritis.  Narrow shoes, especially high heels. PREVENTION  Wear shoes with a wide toe box.  Avoid shoes with high heels.  Wear a small pad between the big toe and second toe.  Maintain proper conditioning:  Foot and ankle flexibility.  Muscle strength and endurance. PROGNOSIS  With proper treatment, bunions can typically be cured. Occasionally, surgery is required.  RELATED COMPLICATIONS   Infection of the bunion.  Arthritis of the first toe.  Risks of surgery, including infection, bleeding, injury to nerves (numb toe), recurrent bunion, overcorrection (toe points inward), arthritis of the big toe, big toe pointing upward, and bone not healing. TREATMENT  Treatment first consists of stopping the activities that aggravate the pain, taking pain medicines, and icing to reduce inflammation and pain. Wear shoes with a wide toe box. Shoes can be modified by a shoe repair person to relieve pressure on the bunion, especially if you cannot find shoes with a wide enough toe box. You may also place a pad with the  center cut out in your shoe, to reduce pressure on the bunion. Sometimes, an arch support (orthotic) may reduce pressure on the bunion and alleviate the symptoms. Stretching and strengthening exercises for the muscles of the foot may be useful. You may choose to wear a brace or pad at night to hold the big toe away from the second toe. If non-surgical treatments are not successful, surgery may be needed. Surgery involves removing the overgrown tissue and correcting the position of the first toe, by realigning the bones. Bunion surgery is typically performed on an outpatient basis, meaning you can go home the same day as surgery. The surgery may involve cutting the mid portion of the bone of the first toe, or just cutting and repairing (reconstructing) the ligaments and soft tissues around the first toe.  MEDICATION   If pain medicine is needed, nonsteroidal anti-inflammatory medicines, such as aspirin and ibuprofen, or other minor pain relievers, such as acetaminophen, are often recommended.  Do not take pain medicine for 7 days before surgery.  Prescription pain relievers are usually only prescribed after surgery. Use only as directed and only as much as you need.  Ointments applied to the skin may be helpful. HEAT AND COLD  Cold treatment (icing) relieves pain and reduces inflammation. Cold treatment should be applied for 10 to 15 minutes every 2 to 3 hours for inflammation and pain and immediately after any activity that aggravates your symptoms. Use ice packs or an ice massage.  Heat treatment may be used prior to performing the stretching and strengthening activities prescribed by your   caregiver, physical therapist, or athletic trainer. Use a heat pack or a warm soak. SEEK MEDICAL CARE IF:   Symptoms get worse or do not improve in 2 weeks, despite treatment.  After surgery, you develop fever, increasing pain, redness, swelling, drainage of fluids, bleeding, or increasing warmth around the  surgical area.  New, unexplained symptoms develop. (Drugs used in treatment may produce side effects.) Document Released: 11/25/2005 Document Revised: 02/17/2012 Document Reviewed: 03/09/2009 [facility] Patient Information 2015 [place], [place]. This information is not intended to replace advice given to you by your health care provider. Make sure you discuss any questions you have with your health care provider.  Have placed a podiatry referral for you. You will be hearing from them. I have refilled your medications. Please use steroid cream 2 times a day until you see me, if this doesn't help clear skin we will try something else or give you a dermatology referral

## 2015-01-16 NOTE — Progress Notes (Signed)
I have reviewed and agree with the resident note.   Dossie Arbour MD

## 2015-02-13 ENCOUNTER — Ambulatory Visit (INDEPENDENT_AMBULATORY_CARE_PROVIDER_SITE_OTHER): Payer: Medicaid Other | Admitting: Podiatry

## 2015-02-13 ENCOUNTER — Ambulatory Visit (INDEPENDENT_AMBULATORY_CARE_PROVIDER_SITE_OTHER): Payer: Medicaid Other

## 2015-02-13 ENCOUNTER — Encounter: Payer: Self-pay | Admitting: Podiatry

## 2015-02-13 VITALS — BP 130/86 | HR 80 | Resp 12

## 2015-02-13 DIAGNOSIS — M205X1 Other deformities of toe(s) (acquired), right foot: Secondary | ICD-10-CM

## 2015-02-13 DIAGNOSIS — M21611 Bunion of right foot: Secondary | ICD-10-CM

## 2015-02-13 DIAGNOSIS — M2011 Hallux valgus (acquired), right foot: Secondary | ICD-10-CM | POA: Diagnosis not present

## 2015-02-13 NOTE — Progress Notes (Signed)
   Subjective:    Patient ID: Rhonda Dominguez, female    DOB: Mar 16, 1961, 54 y.o.   MRN: [id number]  HPI PT STATED RT FOOT BUNION AND BALL OF THE FOOT IS BEEN PAINFUL FOR 2 MONTHS. THE FOOT IS GETTING WORSE AND GET AGGRAVATED BY WEARING HIGH HEELS SHOES. TRIED NO TREATMENT. ALSO,  B/L HEEL HAVE DRY SKIN.  Review of Systems  Musculoskeletal: Positive for gait problem.  All other systems reviewed and are negative.      Objective:   Physical Exam        Assessment & Plan:

## 2015-02-13 NOTE — Patient Instructions (Signed)
Pre-Operative Instructions  Congratulations, you have decided to take an important step to improving your quality of life.  You can be assured that the doctors of [facility] will be with you every step of the way.  1. Plan to be at the surgery center/hospital at least 1 (one) hour prior to your scheduled time unless otherwise directed by the surgical center/hospital staff.  You must have a responsible adult accompany you, remain during the surgery and drive you home.  Make sure you have directions to the surgical center/hospital and know how to get there on time. 2. For hospital based surgery you will need to obtain a history and physical form from your family physician within 1 month prior to the date of surgery- we will give you a form for you primary physician.  3. We make every effort to accommodate the date you request for surgery.  There are however, times where surgery dates or times have to be moved.  We will contact you as soon as possible if a change in schedule is required.   4. No Aspirin/Ibuprofen for one week before surgery.  If you are on aspirin, any non-steroidal anti-inflammatory medications (Mobic, Aleve, Ibuprofen) you should stop taking it 7 days prior to your surgery.  You make take Tylenol  For pain prior to surgery.  5. Medications- If you are taking daily heart and blood pressure medications, seizure, reflux, allergy, asthma, anxiety, pain or diabetes medications, make sure the surgery center/hospital is aware before the day of surgery so they may notify you which medications to take or avoid the day of surgery. 6. No food or drink after midnight the night before surgery unless directed otherwise by surgical center/hospital staff. 7. No alcoholic beverages 24 hours prior to surgery.  No smoking 24 hours prior to or 24 hours after surgery. 8. Wear loose pants or shorts- loose enough to fit over bandages, boots, and casts. 9. No slip on shoes, sneakers are best. 10. Bring  your boot with you to the surgery center/hospital.  Also bring crutches or a walker if your physician has prescribed it for you.  If you do not have this equipment, it will be provided for you after surgery. 11. If you have not been contracted by the surgery center/hospital by the day before your surgery, call to confirm the date and time of your surgery. 12. Leave-time from work may vary depending on the type of surgery you have.  Appropriate arrangements should be made prior to surgery with your employer. 13. Prescriptions will be provided immediately following surgery by your doctor.  Have these filled as soon as possible after surgery and take the medication as directed. 14. Remove nail polish on the operative foot. 15. Wash the night before surgery.  The night before surgery wash the foot and leg well with the antibacterial soap provided and water paying special attention to beneath the toenails and in between the toes.  Rinse thoroughly with water and dry well with a towel.  Perform this wash unless told not to do so by your physician.  Enclosed: 1 Ice pack (please put in freezer the night before surgery)   1 Hibiclens skin cleaner   Pre-op Instructions  If you have any questions regarding the instructions, do not hesitate to call our office.  [place]: [address] [place], [place] [address] [phone]  [place]: [address] [place], [place] [address] [phone]  [place]: [address]  [place], [place] [address] [phone]  Dr. Richard   Tuchman DPM, Dr. Norman Regal DPM Dr. Richard Sikora DPM, Dr. M. Todd Hyatt DPM, Dr. Kathryn Egerton DPM 

## 2015-02-13 NOTE — Progress Notes (Signed)
Subjective:     Patient ID: Rhonda Dominguez, female   DOB: 11-09-1961, 54 y.o.   MRN: [id number]  HPI patient presents stating I have a severely painful big toe joint on my right foot. States it's been there for a long time and is worsened over the last 6 months and is at the point where the patient is unable to walk comfortably or wear shoe gear with any degree of. States that she wants to have something definitive and has a family history of this condition   Review of Systems  All other systems reviewed and are negative.      Objective:   Physical Exam  Constitutional: She is oriented to person, place, and time.  Cardiovascular: Intact distal pulses.   Musculoskeletal: Normal range of motion.  Neurological: She is oriented to person, place, and time.  Skin: Skin is warm.  Nursing note and vitals reviewed.  neurovascular status intact with muscle strength adequate range of motion within normal limits. Digits are well-perfused she is well oriented 3 with no equinus condition noted. She is noted to have significant range of motion loss first metatarsal phalangeal joint right with no crepitus in the joint and obvious dorsal spurring occurring. It is very tender when bending and when I palpated the first MPJ it is very inflamed and sore     Assessment:     Rhonda Dominguez condition right first MPJ long-term nature which is worsened over the last 6 months    Plan:     H&P and x-rays reviewed. Discussed conservative and surgical options and patient wants to have this fixed as she was referred by her family physician for this to be done. I've recommended a biplanar-type osteotomy with pin fixation and spur removal and explained long-term it's possible that she will require joint fusion or possible joint implantation procedure. Patient understands this completely wants surgery and wants to do the consent form today she wants to get the surgery done as soon as possible area I allowed her  to read the consent form going over all alternative treatments and complications and the fact that total recovery. We'll take 6 months to one year with no long-term guarantees and she understands all this and wants surgery. She signed consent form after review and is given preoperative instructions and is scheduled next week for surgery. Encouraged to call with any questions prior to procedure

## 2015-02-14 ENCOUNTER — Telehealth: Payer: Self-pay | Admitting: Family Medicine

## 2015-02-14 ENCOUNTER — Ambulatory Visit (INDEPENDENT_AMBULATORY_CARE_PROVIDER_SITE_OTHER): Payer: Medicaid Other | Admitting: Family Medicine

## 2015-02-14 ENCOUNTER — Encounter: Payer: Self-pay | Admitting: Family Medicine

## 2015-02-14 VITALS — BP 139/86 | HR 83 | Temp 98.4°F | Ht 66.0 in | Wt 150.2 lb

## 2015-02-14 DIAGNOSIS — M21611 Bunion of right foot: Secondary | ICD-10-CM

## 2015-02-14 DIAGNOSIS — L309 Dermatitis, unspecified: Secondary | ICD-10-CM

## 2015-02-14 DIAGNOSIS — E119 Type 2 diabetes mellitus without complications: Secondary | ICD-10-CM

## 2015-02-14 DIAGNOSIS — M2011 Hallux valgus (acquired), right foot: Secondary | ICD-10-CM

## 2015-02-14 LAB — POCT GLYCOSYLATED HEMOGLOBIN (HGB A1C): Hemoglobin A1C: 5.7

## 2015-02-14 NOTE — Assessment & Plan Note (Signed)
Patient  scheduled for surgery with Dr. Ila Mcgill next week. She seems to be unaware of the recovery period will be necessary. Snapshot was given to her today for her history and medications, that she will need to go online to fill out for her podiatrist. Follow-up as needed

## 2015-02-14 NOTE — Telephone Encounter (Signed)
Call pt and inform her that her diabetes (a1c) was good. It was 5.7. We will follow up with her 3 months. Thanks.

## 2015-02-14 NOTE — Patient Instructions (Signed)
Was a pleasure seeing you again today. I am so glad you're finally getting to have surgery on your foot, I think an 6 months or can be glad you did so. The name of your doctor to do your foot surgery is Ila Mcgill. We will check your A1c today, I will call you with the results once they become available. I have placed a referral for your dermatology appointment. They will call you and make an appointment when they have one available.

## 2015-02-14 NOTE — Progress Notes (Signed)
   Subjective:    Patient ID: Rhonda Dominguez, female    DOB: [birth year], 54 y.o.   MRN: [id number]  HPI  Rash: Hallux valgus with bunions:  Current Every day smoker Past Medical History  Diagnosis Date  . Depression   . Hypertension   . Pneumothorax   . Drug abuse   . Diabetes mellitus without complication    No Known Allergies  Review of Systems Per HPI    Objective:   Physical Exam BP 139/86 mmHg  Pulse 83  Temp(Src) 98.4 F (36.9 C) (Oral)  Ht 5\' 6"  (1.676 m)  Wt 150 lb 3.2 oz (68.13 kg)  BMI 24.25 kg/m2 Gen: NAD. Pleasant, cooperative with exam, well-developed, well-nourished. HEENT: AT. [place]. Bilateral eyes without injections or icterus. MMM.  CV: RRR  Chest: CTAB, no wheeze or crackles Abd: Soft.NTND. BS present. No Masses palpated.  Ext: Right Hallux valgus, tender to palpation. Skin: Multiple areas of excoriation over her forearms.        Assessment & Plan:

## 2015-02-14 NOTE — Assessment & Plan Note (Signed)
Uncertain rashes actually eczema. Prior had been responsive times records and 2.5%, and occasionally triamcinolone cream, but not with consistency. Will refer to dermatology. Patient likely will not be able to make appointment for the end of May, considering she's going under foot surgery care setting.

## 2015-02-15 NOTE — Telephone Encounter (Signed)
Spoke with patient and gave below message.

## 2015-02-21 ENCOUNTER — Encounter: Payer: Self-pay | Admitting: Podiatry

## 2015-02-21 ENCOUNTER — Encounter (AMBULATORY_SURGERY_CENTER): Payer: Medicaid Other | Admitting: Podiatry

## 2015-02-21 DIAGNOSIS — M2021 Hallux rigidus, right foot: Secondary | ICD-10-CM | POA: Diagnosis not present

## 2015-02-27 ENCOUNTER — Ambulatory Visit (INDEPENDENT_AMBULATORY_CARE_PROVIDER_SITE_OTHER): Payer: Medicaid Other | Admitting: Podiatry

## 2015-02-27 ENCOUNTER — Ambulatory Visit (INDEPENDENT_AMBULATORY_CARE_PROVIDER_SITE_OTHER): Payer: Medicaid Other

## 2015-02-27 ENCOUNTER — Encounter: Payer: Self-pay | Admitting: Podiatry

## 2015-02-27 DIAGNOSIS — Z9889 Other specified postprocedural states: Secondary | ICD-10-CM

## 2015-02-27 DIAGNOSIS — M2011 Hallux valgus (acquired), right foot: Secondary | ICD-10-CM

## 2015-02-27 DIAGNOSIS — M21611 Bunion of right foot: Secondary | ICD-10-CM

## 2015-02-27 NOTE — Progress Notes (Signed)
   Subjective:    Patient ID: Rhonda Dominguez, female    DOB: Sep 18, 1961, 54 y.o.   MRN: [id number]  HPI  PO F/U Surgery 02-21-15.  Review of Systems  All other systems reviewed and are negative.      Objective:   Physical Exam        Assessment & Plan:

## 2015-03-01 NOTE — Progress Notes (Signed)
DOS 02/21/2015 Bi-planar Austin with pin fixation right foot.

## 2015-03-01 NOTE — Progress Notes (Signed)
Subjective:     Patient ID: Rhonda Dominguez, female   DOB: Jun 09, 1961, 54 y.o.   MRN: [id number]  HPI patient states I'm doing very well with my right foot with minimal pain or swelling   Review of Systems     Objective:   Physical Exam Neurovascular status intact with dressing intact and upon removal showing excellent healing of the incision site with wound edges well coapted and negative Homans sign was noted with a well-functioning big toe with good dorsi and plantarflexion    Assessment:     Doing well post Austin osteotomy right    Plan:     Reapplied sterile dressing reviewed x-rays and advised on continued immobilization elevation and anti-inflammatories as needed

## 2015-03-06 ENCOUNTER — Telehealth: Payer: Self-pay | Admitting: *Deleted

## 2015-03-06 NOTE — Telephone Encounter (Signed)
Pt complains of sharp pain from her toe to past her knee.  I asked pt if she had pain, heat or swelling in her calf, pt denies any symptoms described.  Pt states the right surgery foot hurts, because she has been up on it for her mother's funeral, she states she has soaked in very warm water and cleaned the foot, but it is hard and painful.  I told pt to clean foot as needed in a quick shower, no hot soaks, and to rest, elevate and ice, call with questions and concerns.  Pt agreed.

## 2015-03-13 ENCOUNTER — Ambulatory Visit (INDEPENDENT_AMBULATORY_CARE_PROVIDER_SITE_OTHER): Payer: Medicaid Other

## 2015-03-13 ENCOUNTER — Encounter: Payer: Self-pay | Admitting: Podiatry

## 2015-03-13 ENCOUNTER — Telehealth: Payer: Self-pay | Admitting: *Deleted

## 2015-03-13 ENCOUNTER — Ambulatory Visit (INDEPENDENT_AMBULATORY_CARE_PROVIDER_SITE_OTHER): Payer: Medicaid Other | Admitting: Podiatry

## 2015-03-13 VITALS — BP 143/91 | HR 74 | Resp 18

## 2015-03-13 DIAGNOSIS — Z9889 Other specified postprocedural states: Secondary | ICD-10-CM

## 2015-03-13 DIAGNOSIS — M21611 Bunion of right foot: Secondary | ICD-10-CM

## 2015-03-13 DIAGNOSIS — M2011 Hallux valgus (acquired), right foot: Secondary | ICD-10-CM

## 2015-03-13 NOTE — Telephone Encounter (Signed)
Pt complains of leg numbness, and weakness, and she is propping the leg up.  Left message encouraging pt to call to schedule an appt for today with Dr. Paulla Dolly.

## 2015-03-13 NOTE — Telephone Encounter (Signed)
Called patient to get in today. Patient stated she is unable to come in today but is trying to get a hold of transportation to come in tomorrow. Will be calling back.

## 2015-03-15 NOTE — Progress Notes (Signed)
Subjective:     Patient ID: Rhonda Dominguez, female   DOB: 02/13/1961, 54 y.o.   MRN: [id number]  HPI patient states I was just worried because I noticed a little bit of crusting on my right incision site and I was worried it could be infection   Review of Systems     Objective:   Physical Exam Neurovascular status intact with a small area of crusted tissue in the middle of the incision site right first metatarsal that's localized with no drainage noted no odor or redness noted    Assessment:     Small irritation of the incision site with no indications currently of infection process    Plan:     Clean the area up and applied Silvadene dressing at this time. Instructed on daily soaks and Neosporin application and if any drainage redness or swelling were to occur patient's to let us know immediately

## 2015-03-20 ENCOUNTER — Ambulatory Visit (INDEPENDENT_AMBULATORY_CARE_PROVIDER_SITE_OTHER): Payer: Medicaid Other

## 2015-03-20 ENCOUNTER — Ambulatory Visit (INDEPENDENT_AMBULATORY_CARE_PROVIDER_SITE_OTHER): Payer: Medicaid Other | Admitting: Podiatry

## 2015-03-20 DIAGNOSIS — M21611 Bunion of right foot: Secondary | ICD-10-CM

## 2015-03-20 DIAGNOSIS — M2011 Hallux valgus (acquired), right foot: Secondary | ICD-10-CM | POA: Diagnosis not present

## 2015-03-21 NOTE — Progress Notes (Signed)
Subjective:     Patient ID: Rhonda Dominguez, female   DOB: April 01, 1961, 54 y.o.   MRN: [id number]  HPI patient states I'm doing really well with my right foot and able to walk with minimal discomfort   Review of Systems     Objective:   Physical Exam Neurovascular status intact negative Homans sign noted and wound edges well coapted right first metatarsal with good range of motion with no crepitus    Assessment:     Doing well post osteotomy first metatarsal right    Plan:     X-ray reviewed and begin gradual usage of shoe gear with continued elevation compression recommended

## 2015-05-18 ENCOUNTER — Encounter: Payer: Self-pay | Admitting: Family Medicine

## 2015-05-18 ENCOUNTER — Ambulatory Visit (INDEPENDENT_AMBULATORY_CARE_PROVIDER_SITE_OTHER): Payer: Medicaid Other | Admitting: Family Medicine

## 2015-05-18 VITALS — BP 141/95 | HR 82 | Temp 97.8°F | Ht 66.0 in | Wt 151.8 lb

## 2015-05-18 DIAGNOSIS — Z114 Encounter for screening for human immunodeficiency virus [HIV]: Secondary | ICD-10-CM | POA: Diagnosis not present

## 2015-05-18 DIAGNOSIS — F1410 Cocaine abuse, uncomplicated: Secondary | ICD-10-CM | POA: Diagnosis not present

## 2015-05-18 DIAGNOSIS — L309 Dermatitis, unspecified: Secondary | ICD-10-CM | POA: Diagnosis not present

## 2015-05-18 DIAGNOSIS — E119 Type 2 diabetes mellitus without complications: Secondary | ICD-10-CM | POA: Diagnosis not present

## 2015-05-18 DIAGNOSIS — F17200 Nicotine dependence, unspecified, uncomplicated: Secondary | ICD-10-CM

## 2015-05-18 DIAGNOSIS — Z7253 High risk bisexual behavior: Secondary | ICD-10-CM

## 2015-05-18 DIAGNOSIS — I10 Essential (primary) hypertension: Secondary | ICD-10-CM

## 2015-05-18 DIAGNOSIS — Z0000 Encounter for general adult medical examination without abnormal findings: Secondary | ICD-10-CM | POA: Diagnosis not present

## 2015-05-18 DIAGNOSIS — Z720 Tobacco use: Secondary | ICD-10-CM | POA: Diagnosis not present

## 2015-05-18 DIAGNOSIS — Z23 Encounter for immunization: Secondary | ICD-10-CM | POA: Diagnosis not present

## 2015-05-18 LAB — POCT GLYCOSYLATED HEMOGLOBIN (HGB A1C): Hemoglobin A1C: 5.7

## 2015-05-18 LAB — RPR

## 2015-05-18 MED ORDER — HYDROCHLOROTHIAZIDE 25 MG PO TABS: 25.0000 mg | ORAL_TABLET | Freq: Two times a day (BID) | ORAL | Status: DC

## 2015-05-18 NOTE — Assessment & Plan Note (Signed)
Rhonda Dominguez is a 54 y.o.  AAF presents to family medicine with multiple complaints.   - History of diabetes: Patient's last A1c was 5.7, metformin was discontinued at that time she was only taking 500 mg once daily. Repeat A1c today is 5.7. Patient is to continue to watch her diet and exercise. Likely will only need A1c every 6 months to a year, unless symptomatic.

## 2015-05-18 NOTE — Patient Instructions (Signed)
Your sugar still looks good, it is unchanged from last time and is 5.7. We do not need to start any medications on you, continue to watch her diet and exercise. For help in stopping smoking please make an appointment with Dr. Valentina Lucks on your way out today. Please make an appointment to have your Pap smear completed as soon as possible I have replaced her dermatology referral with your wishes to go outside of [facility]. We have increased your blood pressure medicine, HCTZ to 25 mg 2 times a day. I will call you with your lab work once it becomes available Please schedule your colonoscopy and your mammogram with the information I gave you today.

## 2015-05-18 NOTE — Assessment & Plan Note (Signed)
Rhonda Dominguez is a 54 y.o.  AAF presents to family medicine with multiple complaints. - Hypertension: Elevated today, will increase HCTZ to 25 mg twice a day now, follow-up in 3 months. Patient is to attempt to try to exercise when able, and follow a low-salt diet.  Marland Kitchen

## 2015-05-18 NOTE — Assessment & Plan Note (Signed)
Rhonda Dominguez is a 54 y.o.  AAF presents to family medicine with multiple complaints.  - smoking cessation: Patient states the patches were not helpful, she didn't like the nicotine gum. I've advised her that she would like to quit smoking she could follow-up with Dr. Valentina Lucks for smoking cessation assistance. She did gauge some interest in this, I have asked her to make an appointment on her way out today with him.

## 2015-05-18 NOTE — Assessment & Plan Note (Signed)
Rhonda Dominguez is a 54 y.o.  AAF presents to family medicine with multiple complaints.   - Health maintenance: Tetanus and pneumonia vaccinations were given today. Patient was due for well woman exam but wanted to defer today. Asked her to make an appointment within the next 6 months to have this completed. Information on colonoscopy and mammogram was given to her today as well as contact information. She was strongly encouraged to make both of these appointments.

## 2015-05-18 NOTE — Assessment & Plan Note (Signed)
Rhonda Dominguez is a 54 y.o.  AAF presents to family medicine with multiple complaints.  - Polysubstance abuse: Patient has been cocaine free for 6 months, and does not drink alcohol except for one to 2 times a month. Encouragement was given today.

## 2015-05-18 NOTE — Progress Notes (Signed)
I was preceptor the day of this visit.   

## 2015-05-18 NOTE — Assessment & Plan Note (Signed)
Rhonda Dominguez is a 54 y.o.  AAF presents to family medicine with multiple complaints. HIV, RPR and hepatitis panel as well as HCV collected today.

## 2015-05-18 NOTE — Assessment & Plan Note (Signed)
Rhonda Dominguez is a 54 y.o.  AAF presents to family medicine with multiple complaints. HIV collected today. High-risk sexual behavior and drug use. Patient now using condoms.

## 2015-05-18 NOTE — Assessment & Plan Note (Signed)
Rhonda Dominguez is a 54 y.o.  AAF presents to family medicine with multiple complaints.  - Eczema: Patient's rash continues, with multiple hyperpigmented areas. Uncertain etiology, she is asking for dermatology referral today. She had a dermatology referral placed prior but for some reason it was never scheduled. Patient would like to go out of town for referral. Dermatology referral placed again today.

## 2015-05-18 NOTE — Progress Notes (Signed)
Subjective:    Patient ID: Rhonda Dominguez, female    DOB: 02-28-1961, 54 y.o.   MRN: [id number]  HPI   Annual Physical: Patient presents to family medicine clinic for her annual physical with multiple complaints.  - Hypertension: Patient reports compliance with her medications, she states her blood pressure has been elevated over the last few months and she checks it. She currently is prescribed HCTZ 25 mg daily. She does not follow a low-salt diet, she has been unable to exercise due to recent surgery, but has attempted to keep physically active. She denies any chest pain, shortness of breath, palpitations, orthopnea or visual changes.  - History of diabetes: Patient's last A1c was 5.7, metformin was discontinued at that time she was only taking 500 mg once daily. Patient denies any hyper, hypoglycemic events. She does not check her blood sugars rarely. She denies any nonhealing wounds, and recently had bunionectomy right large toe without complications.  - Polysubstance abuse: Patient has been cocaine free for 6 months, and does not drink alcohol except for one to 2 times a month.   - Eczema: Patient's rash continues. It is over arms, legs and abdomen. Patient states that is sometimes itchy, she has used avoid cream in the past without good result. She wants a dermatology referral again today. A dermatology referral was placed approximately 3-4 months ago, however this was never scheduled for her.  - smoking cessation: Patient states the patches were not helpful, she didn't like the nicotine gum. She is still smoking 2 packs of cigarettes a day and would like assistance in quitting. She is considering the e-cigarette.  - Health maintenance: Tetanus and pneumonia vaccinations are due today. Patient was due for well woman exam but wanted to defer today. Colonoscopy and mammogram are also due now. Patient would like to be tested for HIV again, she has concerns from her past habits.  Past  Medical History  Diagnosis Date  . Depression   . Hypertension   . Pneumothorax   . Drug abuse   . Diabetes mellitus without complication    No Known Allergies  Review of Systems Per HPI    Objective:   Physical Exam BP 141/95 mmHg  Pulse 82  Temp(Src) 97.8 F (36.6 C) (Oral)  Ht 5\' 6"  (1.676 m)  Wt 151 lb 12.8 oz (68.856 kg)  BMI 24.51 kg/m2 Gen: NAD. Nontoxic in appearance, well-developed, well-nourished, African-American female. Talkative. Cooperative with exam.  HEENT: AT. [place]. Bilateral TM visualized and normal in appearance. Bilateral eyes without injections or icterus. MMM. Bilateral nares without erythema or swelling. Throat without erythema or exudates.  CV: RRR no murmurs appreciated Chest: CTAB, no wheeze or crackles Abd: Soft. Flat. NTND. BS present. No Masses palpated.  Ext: No erythema. No edema. +2/4 PT bilaterally Skin: No purpura or petechiae. Multiple excoriations over body, hyperpigmented lesions over arms and trunk and legs. Neuro: Normal gait, walks with mild limp since right toe surgery. PERLA. EOMi. Alert. Oriented. Cranial nerves II through XII intact. Psych: Normal dress, talkative, rapid speech, tangential thought.     Assessment & Plan:  Rhonda Dominguez is a 54 y.o.  AAF presents to family medicine with multiple complaints. - Hypertension: Elevated today, will increase HCTZ to 25 mg twice a day now, follow-up in 3 months. Patient is to attempt to try to exercise when able, and follow a low-salt diet.  - History of diabetes: Patient's last A1c was 5.7, metformin was discontinued at that time  she was only taking 500 mg once daily. Repeat A1c today is 5.7. Patient is to continue to watch her diet and exercise. Likely will only need A1c every 6 months to a year, unless symptomatic.  - Polysubstance abuse: Patient has been cocaine free for 6 months, and does not drink alcohol except for one to 2 times a month. Encouragement was given today.  - Eczema:  Patient's rash continues, with multiple hyperpigmented areas. Uncertain etiology, she is asking for dermatology referral today. She had a dermatology referral placed prior but for some reason it was never scheduled. Patient would like to go out of town for referral. Dermatology referral placed again today.  - smoking cessation: Patient states the patches were not helpful, she didn't like the nicotine gum. I've advised her that she would like to quit smoking she could follow-up with Dr. Valentina Lucks for smoking cessation assistance. She did gauge some interest in this, I have asked her to make an appointment on her way out today with him.  - Health maintenance: Tetanus and pneumonia vaccinations were given today. Patient was due for well woman exam but wanted to defer today. Asked her to make an appointment within the next 6 months to have this completed. Information on colonoscopy and mammogram was given to her today as well as contact information. She was strongly encouraged to make both of these appointments.

## 2015-05-19 ENCOUNTER — Encounter: Payer: Self-pay | Admitting: Family Medicine

## 2015-05-19 ENCOUNTER — Telehealth: Payer: Self-pay | Admitting: Family Medicine

## 2015-05-19 LAB — HEPATITIS PANEL, ACUTE
HCV Ab: NEGATIVE
Hep A IgM: NONREACTIVE
Hep B C IgM: NONREACTIVE
Hepatitis B Surface Ag: NEGATIVE

## 2015-05-19 LAB — HIV ANTIBODY (ROUTINE TESTING W REFLEX): HIV 1&2 Ab, 4th Generation: NONREACTIVE

## 2015-05-19 LAB — HEPATITIS C ANTIBODY: HCV Ab: NEGATIVE

## 2015-05-19 NOTE — Telephone Encounter (Signed)
Please call patient and inform her that all her blood work was normal. Negative for HIV, syphilis  or hepatitis of any kind. I will send a copy to her home as well. Please verify address with patient before sending. Thanks.

## 2015-05-19 NOTE — Telephone Encounter (Signed)
Contacted pt and informed below and verified the correct mailing address. Rhonda Dominguez, Rhonda Dominguez, [name]

## 2015-05-22 ENCOUNTER — Ambulatory Visit: Payer: Medicaid Other | Admitting: Pharmacist

## 2015-05-23 ENCOUNTER — Ambulatory Visit: Payer: Medicaid Other | Admitting: Family Medicine

## 2015-05-25 ENCOUNTER — Ambulatory Visit: Payer: Medicaid Other | Admitting: Pharmacist

## 2015-05-30 ENCOUNTER — Ambulatory Visit: Payer: Medicaid Other | Admitting: Family Medicine

## 2015-07-17 ENCOUNTER — Telehealth: Payer: Self-pay | Admitting: Family Medicine

## 2015-07-17 NOTE — Telephone Encounter (Signed)
Calling because she lost her information given to her on her last appt (05/18/2015) regarding making appointments for a colonoscopy and a mammogram. She would like for this information to be mailed to her. Sadie Reynolds, ASA

## 2015-07-19 NOTE — Telephone Encounter (Signed)
Mailed out information on scheduling the colonoscopy and mammogram. Ottis Stain, CMA

## 2015-07-25 ENCOUNTER — Telehealth: Payer: Self-pay | Admitting: Family Medicine

## 2015-07-25 ENCOUNTER — Other Ambulatory Visit: Payer: Self-pay

## 2015-07-25 DIAGNOSIS — Z1231 Encounter for screening mammogram for malignant neoplasm of breast: Secondary | ICD-10-CM

## 2015-07-25 DIAGNOSIS — Z1211 Encounter for screening for malignant neoplasm of colon: Secondary | ICD-10-CM

## 2015-07-25 NOTE — Telephone Encounter (Signed)
Dr. Lonny Prude, due to the pt's insurance, she needs to have a referral for the colonoscopy. Thank you, Ottis Stain, CMA

## 2015-07-25 NOTE — Telephone Encounter (Signed)
Friend of Rhonda Dominguez that is handling pts  Medical issues [facility] says they need her medical records before they will schedule her colonospopy Please advise

## 2015-08-09 ENCOUNTER — Ambulatory Visit
Admission: RE | Admit: 2015-08-09 | Discharge: 2015-08-09 | Disposition: A | Discharge status: Home / Self Care | Payer: Medicaid Other | Source: Ambulatory Visit

## 2015-08-09 DIAGNOSIS — Z1231 Encounter for screening mammogram for malignant neoplasm of breast: Secondary | ICD-10-CM

## 2015-09-06 ENCOUNTER — Other Ambulatory Visit: Payer: Self-pay | Admitting: *Deleted

## 2015-09-06 DIAGNOSIS — I10 Essential (primary) hypertension: Secondary | ICD-10-CM

## 2015-09-07 MED ORDER — HYDROCHLOROTHIAZIDE 25 MG PO TABS: 25.0000 mg | ORAL_TABLET | Freq: Two times a day (BID) | ORAL | Status: DC

## 2015-09-08 ENCOUNTER — Encounter: Payer: Self-pay | Admitting: Podiatry

## 2015-09-08 ENCOUNTER — Ambulatory Visit (INDEPENDENT_AMBULATORY_CARE_PROVIDER_SITE_OTHER): Payer: Medicaid Other

## 2015-09-08 ENCOUNTER — Ambulatory Visit (INDEPENDENT_AMBULATORY_CARE_PROVIDER_SITE_OTHER): Payer: Medicaid Other | Admitting: Podiatry

## 2015-09-08 ENCOUNTER — Ambulatory Visit: Payer: Medicaid Other | Admitting: Podiatry

## 2015-09-08 VITALS — BP 136/87 | HR 77 | Resp 16

## 2015-09-08 DIAGNOSIS — Z9889 Other specified postprocedural states: Secondary | ICD-10-CM

## 2015-09-08 DIAGNOSIS — M2011 Hallux valgus (acquired), right foot: Secondary | ICD-10-CM | POA: Diagnosis not present

## 2015-09-08 DIAGNOSIS — Z472 Encounter for removal of internal fixation device: Secondary | ICD-10-CM

## 2015-09-08 NOTE — Patient Instructions (Signed)
Pre-Operative Instructions  Congratulations, you have decided to take an important step to improving your quality of life.  You can be assured that the doctors of [facility] will be with you every step of the way.  1. Plan to be at the surgery center/hospital at least 1 (one) hour prior to your scheduled time unless otherwise directed by the surgical center/hospital staff.  You must have a responsible adult accompany you, remain during the surgery and drive you home.  Make sure you have directions to the surgical center/hospital and know how to get there on time. 2. For hospital based surgery you will need to obtain a history and physical form from your family physician within 1 month prior to the date of surgery- we will give you a form for you primary physician.  3. We make every effort to accommodate the date you request for surgery.  There are however, times where surgery dates or times have to be moved.  We will contact you as soon as possible if a change in schedule is required.   4. No Aspirin/Ibuprofen for one week before surgery.  If you are on aspirin, any non-steroidal anti-inflammatory medications (Mobic, Aleve, Ibuprofen) you should stop taking it 7 days prior to your surgery.  You make take Tylenol  For pain prior to surgery.  5. Medications- If you are taking daily heart and blood pressure medications, seizure, reflux, allergy, asthma, anxiety, pain or diabetes medications, make sure the surgery center/hospital is aware before the day of surgery so they may notify you which medications to take or avoid the day of surgery. 6. No food or drink after midnight the night before surgery unless directed otherwise by surgical center/hospital staff. 7. No alcoholic beverages 24 hours prior to surgery.  No smoking 24 hours prior to or 24 hours after surgery. 8. Wear loose pants or shorts- loose enough to fit over bandages, boots, and casts. 9. No slip on shoes, sneakers are best. 10. Bring  your boot with you to the surgery center/hospital.  Also bring crutches or a walker if your physician has prescribed it for you.  If you do not have this equipment, it will be provided for you after surgery. 11. If you have not been contracted by the surgery center/hospital by the day before your surgery, call to confirm the date and time of your surgery. 12. Leave-time from work may vary depending on the type of surgery you have.  Appropriate arrangements should be made prior to surgery with your employer. 13. Prescriptions will be provided immediately following surgery by your doctor.  Have these filled as soon as possible after surgery and take the medication as directed. 14. Remove nail polish on the operative foot. 15. Wash the night before surgery.  The night before surgery wash the foot and leg well with the antibacterial soap provided and water paying special attention to beneath the toenails and in between the toes.  Rinse thoroughly with water and dry well with a towel.  Perform this wash unless told not to do so by your physician.  Enclosed: 1 Ice pack (please put in freezer the night before surgery)   1 Hibiclens skin cleaner   Pre-op Instructions  If you have any questions regarding the instructions, do not hesitate to call our office.  [place]: [address] [place], [place] [address] [phone]  [place]: [address] , [place] [address] [phone]  [place]: [address]  [place], [place] [address] [phone]  Dr. Richard   Tuchman DPM, Dr. Norman Regal DPM Dr. Richard Sikora DPM, Dr. M. Todd Hyatt DPM, Dr. Kathryn Egerton DPM 

## 2015-09-08 NOTE — Progress Notes (Signed)
Subjective:     Patient ID: Rhonda Dominguez, female   DOB: [birth year], 54 y.o.   MRN: [id number]  HPI patient states the bone on top is bothering me and I need to have these pins removed   Review of Systems     Objective:   Physical Exam Neurovascular status intact no change in health history currently with good digital perfusion and well oriented. On top of the first metatarsal shaft there is prominent pin formation which is most likely causing her irritation and good range of motion of the joint is noted    Assessment:     Abnormal pin position creating irritation    Plan:     H&P and x-rays reviewed. I've recommended removal of pin and explained procedure and risk and patient wants this done. At this time I allowed her to read a consent form reviewing all possible complications alternative treatments and she is willing to accept risk and signs consent form after review. Total recovery. We'll take several months for this procedure

## 2015-09-12 DIAGNOSIS — M2021 Hallux rigidus, right foot: Secondary | ICD-10-CM | POA: Diagnosis not present

## 2015-09-22 ENCOUNTER — Ambulatory Visit (INDEPENDENT_AMBULATORY_CARE_PROVIDER_SITE_OTHER): Payer: Medicaid Other

## 2015-09-22 ENCOUNTER — Ambulatory Visit (INDEPENDENT_AMBULATORY_CARE_PROVIDER_SITE_OTHER): Payer: Medicaid Other | Admitting: Podiatry

## 2015-09-22 ENCOUNTER — Encounter: Payer: Self-pay | Admitting: Podiatry

## 2015-09-22 DIAGNOSIS — M2010 Hallux valgus (acquired), unspecified foot: Secondary | ICD-10-CM

## 2015-09-22 DIAGNOSIS — Z472 Encounter for removal of internal fixation device: Secondary | ICD-10-CM

## 2015-09-22 DIAGNOSIS — M79676 Pain in unspecified toe(s): Secondary | ICD-10-CM

## 2015-09-22 DIAGNOSIS — B351 Tinea unguium: Secondary | ICD-10-CM

## 2015-09-22 DIAGNOSIS — Z9889 Other specified postprocedural states: Secondary | ICD-10-CM | POA: Diagnosis not present

## 2015-09-25 NOTE — Progress Notes (Signed)
Subjective:     Patient ID: Rhonda Dominguez, female   DOB: [birth year], 54 y.o.   MRN: [id number]  HPI patient states I'm doing well with my healing but I'm having some trouble with the big toenail   Review of Systems     Objective:   Physical Exam Neurovascular status intact muscle strength adequate and excellent healing of the right first metatarsal where pins are removed with wound edges well coapted stitches intact and moderate changes to the left hallux nail    Assessment:     Mycotic nail with damaged ingrown component along with well-healing surgical sites right    Plan:     Sterile dressing applied to the right foot and advised on local topical therapy for the left and wider shoes

## 2015-10-06 ENCOUNTER — Ambulatory Visit (INDEPENDENT_AMBULATORY_CARE_PROVIDER_SITE_OTHER): Payer: Medicaid Other | Admitting: Podiatry

## 2015-10-06 DIAGNOSIS — Z9889 Other specified postprocedural states: Secondary | ICD-10-CM

## 2015-10-06 DIAGNOSIS — Z472 Encounter for removal of internal fixation device: Secondary | ICD-10-CM

## 2015-10-06 MED ORDER — HYDROCODONE-ACETAMINOPHEN 5-325 MG PO TABS: 1.0000 | ORAL_TABLET | Freq: Four times a day (QID) | ORAL | Status: DC | PRN

## 2015-10-09 NOTE — Progress Notes (Signed)
Subjective:     Patient ID: Rhonda Dominguez, female   DOB: May 07, 1961, 54 y.o.   MRN: [id number]  HPI patient presents stating my right foot is doing really well and I'm ready to get stitches removed   Review of Systems     Objective:   Physical Exam Neurovascular status intact wound edges well coapted with stitches intact right dorsal foot    Assessment:     Doing well post pin removal right    Plan:     Stitches removed wound edges coapted well sterile dressing applied and reappoint to recheck as needed

## 2015-10-11 ENCOUNTER — Other Ambulatory Visit: Payer: Self-pay | Admitting: *Deleted

## 2015-10-13 MED ORDER — TRIAMCINOLONE ACETONIDE 0.1 % EX CREA: 1.0000 "application " | TOPICAL_CREAM | Freq: Two times a day (BID) | CUTANEOUS | Status: DC | PRN

## 2015-10-13 MED ORDER — HYDROXYZINE HCL 25 MG PO TABS: 25.0000 mg | ORAL_TABLET | Freq: Four times a day (QID) | ORAL | Status: DC | PRN

## 2015-10-23 ENCOUNTER — Other Ambulatory Visit: Payer: Self-pay | Admitting: *Deleted

## 2015-10-23 MED ORDER — HYDROXYZINE HCL 25 MG PO TABS: 25.0000 mg | ORAL_TABLET | Freq: Four times a day (QID) | ORAL | Status: DC | PRN

## 2015-11-29 ENCOUNTER — Telehealth: Payer: Self-pay | Admitting: Family Medicine

## 2015-11-29 NOTE — Telephone Encounter (Signed)
"  the dr knows what to do.she needs to send my medical records to the colonoscopy place.  I just had foot surgery so I need the release form mailed to me.  I have never had this much trouble before. Just tell my dr to call me at [phone]"

## 2015-12-06 ENCOUNTER — Other Ambulatory Visit: Payer: Self-pay | Admitting: Family Medicine

## 2015-12-06 DIAGNOSIS — I10 Essential (primary) hypertension: Secondary | ICD-10-CM

## 2015-12-06 NOTE — Telephone Encounter (Signed)
Pt requesting to speak to her provider. I asked what the nature of the call is and she states that she needs a "refill on everything, asthma pump, insulin pills, blood pressure medicine and cigarette patches." She could only tell me the name of the patches which were Nicotine (NICODERM CQ TD). She couldn't verify any other information and strongly suggested that her PCP call her back. Sadie Reynolds, ASA

## 2015-12-06 NOTE — Telephone Encounter (Signed)
Please read message from patient below.  Derl Barrow, RN

## 2015-12-07 MED ORDER — NICOTINE 14 MG/24HR TD PT24: 14.0000 mg | MEDICATED_PATCH | Freq: Every day | TRANSDERMAL | Status: DC

## 2015-12-07 MED ORDER — HYDROCHLOROTHIAZIDE 25 MG PO TABS: 25.0000 mg | ORAL_TABLET | Freq: Two times a day (BID) | ORAL | Status: DC

## 2015-12-07 NOTE — Telephone Encounter (Signed)
Called patient to discuss refills. After getting off the phone, realized patient does not have any inhaler-type medications. Will not prescribe any new medications. Refilled Nicotine patch and hydrochlorothiazide.

## 2015-12-07 NOTE — Telephone Encounter (Signed)
Called patient regarding another issue. Patient will follow-up with me next week

## 2015-12-13 ENCOUNTER — Ambulatory Visit (INDEPENDENT_AMBULATORY_CARE_PROVIDER_SITE_OTHER): Payer: Medicaid Other | Admitting: Family Medicine

## 2015-12-13 ENCOUNTER — Encounter: Payer: Self-pay | Admitting: Family Medicine

## 2015-12-13 VITALS — BP 122/84 | HR 83 | Temp 98.0°F | Ht 66.0 in | Wt 157.2 lb

## 2015-12-13 DIAGNOSIS — Z0000 Encounter for general adult medical examination without abnormal findings: Secondary | ICD-10-CM

## 2015-12-13 DIAGNOSIS — Z1211 Encounter for screening for malignant neoplasm of colon: Secondary | ICD-10-CM

## 2015-12-13 DIAGNOSIS — E119 Type 2 diabetes mellitus without complications: Secondary | ICD-10-CM | POA: Diagnosis present

## 2015-12-13 DIAGNOSIS — F17200 Nicotine dependence, unspecified, uncomplicated: Secondary | ICD-10-CM

## 2015-12-13 DIAGNOSIS — F319 Bipolar disorder, unspecified: Secondary | ICD-10-CM

## 2015-12-13 DIAGNOSIS — L309 Dermatitis, unspecified: Secondary | ICD-10-CM | POA: Diagnosis not present

## 2015-12-13 DIAGNOSIS — J42 Unspecified chronic bronchitis: Secondary | ICD-10-CM

## 2015-12-13 LAB — GLUCOSE, CAPILLARY: GLUCOSE-CAPILLARY: 112 mg/dL — AB (ref 65–99)

## 2015-12-13 LAB — POCT GLYCOSYLATED HEMOGLOBIN (HGB A1C): Hemoglobin A1C: 5.7

## 2015-12-13 MED ORDER — ALBUTEROL SULFATE HFA 108 (90 BASE) MCG/ACT IN AERS: 2.0000 | INHALATION_SPRAY | Freq: Four times a day (QID) | RESPIRATORY_TRACT | Status: DC | PRN

## 2015-12-13 NOTE — Telephone Encounter (Signed)
Pt was seen in office today and this was addressed. Rhonda Dominguez, [name], [name]

## 2015-12-13 NOTE — Patient Instructions (Signed)
Thank you so much for coming to visit today1 - Please sign the release of information forms at the front desk  - An albuterol inhaler has been sent to the pharmacy for you to pick up. Please only use this when needed. - Please give [place] a list of all of the medications you are taking. - A referral to Gastroenterology for colonoscopy and Dermatology have been placed. Per our referral specialist, this is the last time these referrals will be made and you will need to keep your appointments at both places. - I will contact [facility] and write prescriptions if appropriate. - Follow up soon for a physical. We will do your pap smear at this visit.  Thanks again! Dr. Gerlean Ren

## 2015-12-14 DIAGNOSIS — Z79899 Other long term (current) drug therapy: Secondary | ICD-10-CM | POA: Insufficient documentation

## 2015-12-14 NOTE — Assessment & Plan Note (Addendum)
-   Suspicions that part of history which patient reports is untrue. Reports Diabetes, however A1C has always been 5.7 since 2015. Removing from problem list.  - Reports history of COPD but no PFTs noted in chart. Will give prescription for albuterol inhaler. No further refills until documented by PFT. - Reports long history of fecal and urinary incontinence over many years and requests refill of bed pads and diapers. Note that patient denies this is new symptom and not associated with back pain or saddle anesthesia. No workup documented in epic and never reported before by patient until first visit at [facility] in 2015. Will discuss history of symptoms further at next visit.  - Requests prescription for walker. Initially prescribed for acute knee injury in 2008 and then for bunionectomy 02/2015 and 05/2015. No chronic issues requiring walker and ambulating very well today. Will not prescribe. - Will contact [facility] to discuss history of need for walker, diapers, and bed pads to get a better understanding.  - Referral to GI ordered for colonoscopy. Hopeful may also workup reported fecal incontinence. Referral to Dermatology placed gain. Note per referral coordinator referrals will not be placed again as these have been ordered many times without follow through by patient. - Follow up for pap smear and to further discuss history.

## 2015-12-14 NOTE — Progress Notes (Signed)
Subjective:     Patient ID: Rhonda Dominguez, female   DOB: 12-25-1960, 55 y.o.   MRN: [id number]  HPI [name] is a 55yo female presenting today for multiple complaints. - Reports she needs prescriptions for bed pads, diapers, and a walker sent to [facility]. No documentation in epic for diagnoses that would require these. States we can contact [facility] at [phone] and discuss prior need for these items with them. - States she was unable to make her Dermatology appointment after referred and requests new referral today. - States she needs another referral for Colonoscopy - History of Tobacco and Cocaine use. Requests Nicotine patch today. - Follows with [facility] for therapy and psych medications. Next appointment next month. Reports compliance with medications.  - Denies suicidal or homicidal ideation today - States she has been having intermittent wheezing over the last several weeks. Reports history of being on inhaler, but unable to remember name. Requests refill. - Medications reviewed. States she takes other medications for psychiatric diagnosis but does not remember what they are. Only documented to be using HCTZ25mg  BID, Hydroxyzine, Nicotine Patch, and Triamcinolone. - Last mammogram 07/2015 - No documented pap smears or colonoscopies.  Chart Review:  2009: History of COPD, HTN, Bipolar Affective Disorder, and Eczema noted. Medications included HCTZ 25mg  daily, Zoloft 100mg  daily, Zyprexa 5mg  daily, Amantadine 100mg  q12hr, Hydroxyzine 25mg  q6hr PRN itching, Trazodone 200mg , Spiriva, Advair, Albuterol, Megace, Lisinopril 20mg  daily, Nicotine patch, and Darvocet 100-650mg  qid PRN  10/2014: Presented to [facility] to establish care. Reported Diabetes at this visit however reported blood sugars were normal and did not remember medication she was on. States she had been out of medication for 21mo. A1C was normal so medication was not prescribed. Also reported fecal and urinary  incontinence, but unable to provide information as to etiology. Has a nurse in home seven days a week. Prescribed bed pads and diapers for incontinence. Reported history of bilateral tubal ligation. Per nursing on portable oxygen as needed.   2016: Right bunionectomy. Referral to Dermatology for unresolved eczema with medication.  Review of Systems Per HPI    Objective:   Physical Exam  Constitutional: She appears well-developed and well-nourished. No distress.  HENT:  Head: Normocephalic and atraumatic.  Right Ear: External ear normal.  Left Ear: External ear normal.  Mouth/Throat: No oropharyngeal exudate.  Cardiovascular: Normal rate and regular rhythm.  Exam reveals no gallop and no friction rub.   No murmur heard. Pulmonary/Chest: Effort normal. No respiratory distress. She has no wheezes. She has no rales.  Abdominal: Soft. She exhibits no distension. There is no tenderness.  Musculoskeletal: She exhibits no edema.  Gait normal. Walked and stood in room frequently.  Skin:  Hyperpigmented lesions noted on abdomen and extremities   Psychiatric:  Pressured speech with flight of ideas. Psychomotor agitation. Argumentative. Denies SI or HI.      Assessment and Plan:     Health care maintenance - Suspicions that part of history which patient reports is untrue. Reports Diabetes, however A1C has always been 5.7 since 2015. Removing from problem list.  - Reports history of COPD but no PFTs noted in chart. Will give prescription for albuterol inhaler. No further refills until documented by PFT. - Reports long history of fecal and urinary incontinence over many years and requests refill of bed pads and diapers. Note that patient denies this is new symptom and not associated with back pain or saddle anesthesia. No workup documented in epic and  never reported before by patient until first visit at [facility] in 2015. Will discuss history of symptoms further at next visit.  - Requests prescription  for walker. Initially prescribed for acute knee injury in 2008 and then for bunionectomy 02/2015 and 05/2015. No chronic issues requiring walker and ambulating very well today. Will not prescribe. - Will contact [facility] to discuss history of need for walker, diapers, and bed pads to get a better understanding.  - Referral to GI ordered for colonoscopy. Hopeful may also workup reported fecal incontinence. Referral to Dermatology placed gain. Note per referral coordinator referrals will not be placed again as these have been ordered many times without follow through by patient. - Follow up for pap smear and to further discuss history.  TOBACCO USER - Smoking status discussed. Encouraged to use Nicotine patches, which patient has at home.  BIPOLAR AFFECTIVE DISORDER - No medications in Med Rec. Patient unable to recall names of medication. Patient to call and give office names of medication. - Follows with [facility] for medication management and therapy

## 2015-12-14 NOTE — Assessment & Plan Note (Signed)
-   No medications in Med Rec. Patient unable to recall names of medication. Patient to call and give office names of medication. - Follows with Mental Health for medication management and therapy

## 2015-12-14 NOTE — Assessment & Plan Note (Signed)
-   Smoking status discussed. Encouraged to use Nicotine patches, which patient has at home.

## 2015-12-15 ENCOUNTER — Telehealth: Payer: Self-pay | Admitting: Family Medicine

## 2015-12-15 NOTE — Telephone Encounter (Signed)
[place] at number given. States that she received a Walker, Tub Seat, and Bedside Commode in 08/2013 for acute knee injury. Received Incontinence Supplies in 07/2013 and 01/2014, but none since that time. Does not provide home health services for patient.  Will not prescribe the above equipment at this time. Will discuss need for them at next appointment.

## 2015-12-27 ENCOUNTER — Telehealth: Payer: Self-pay | Admitting: *Deleted

## 2015-12-27 NOTE — Telephone Encounter (Signed)
Patient calling to check on status of her colonoscopy and dermatology referrals.  Also requesting refill of Nicoderm Step 1 patch.  States recent Nicoderm patch Rx'd by Dr. Lonny Prude was incorrect for Step 2.  Patient was speaking in loud tone and i asked her to lower her voice so that I could better understand and help her.  Explained that referrals had been ordered and pending appt notification from those offices.  Will check with Dr. Gerlean Ren about changing Nicoderm patch.  Patient verbalized understanding and apologetic for "yelling".  Burna Forts, BSN, RN-BC  Ok per Dr. Gerlean Ren to refill Nicoderm Step 1 patches ( #2 week supply) given and can receive additional refills at appt on 01/08/16 if still needed.  Per [place] (referral coordinator)--patient has dermatology appt in [place] for 03/04/16 at 10:45 am with Dr. Nicole Kindred and letter mailed to patient.  Dr. Ulyses Amor office will no longer see patient (reason why not indicated) and awaiting response back from [facility] if they will be able to accept patient.  Burna Forts, BSN, RN-BC  Per Tia--[facility] not taking unassigned patients.  Patient will be referred to [facility].  Referral form faxed and they will contact patient in next week to set up appt.    Will check with Dr. Gerlean Ren for status of walker.  Patient requesting the rolling walker with built in bench to sit on.  Burna Forts, BSN, RN-BC

## 2015-12-27 NOTE — Telephone Encounter (Signed)
No chronic issues requiring walker documented in chart review. Had walker given in past for acute issues which have resolved. Gait was normal at last office visit without any joint abnormalities noted. No medical reason at this time for walker, so walker will not be prescribed.

## 2016-01-01 NOTE — Telephone Encounter (Signed)
PT is scheduled for an appointment with Dr. Gerlean Ren and will let her discuss the below information about the walker at this visit. That appointment is on 01/08/16 with Dr. Gerlean Ren. Rhonda Dominguez, April D, [name]

## 2016-01-02 NOTE — Telephone Encounter (Signed)
Pt is calling to speak to [place] again. She said she still has some questions. jw

## 2016-01-08 ENCOUNTER — Encounter: Payer: Self-pay | Admitting: Family Medicine

## 2016-01-08 ENCOUNTER — Ambulatory Visit (INDEPENDENT_AMBULATORY_CARE_PROVIDER_SITE_OTHER): Payer: Medicaid Other | Admitting: Family Medicine

## 2016-01-08 ENCOUNTER — Other Ambulatory Visit (HOSPITAL_COMMUNITY)
Admission: RE | Admit: 2016-01-08 | Discharge: 2016-01-08 | Disposition: A | Discharge status: Home / Self Care | Payer: Medicaid Other | Source: Ambulatory Visit | Attending: Family Medicine | Admitting: Family Medicine

## 2016-01-08 VITALS — BP 122/96 | HR 102 | Temp 97.9°F | Wt 157.0 lb

## 2016-01-08 DIAGNOSIS — Z124 Encounter for screening for malignant neoplasm of cervix: Secondary | ICD-10-CM | POA: Diagnosis present

## 2016-01-08 DIAGNOSIS — Z1151 Encounter for screening for human papillomavirus (HPV): Secondary | ICD-10-CM | POA: Diagnosis not present

## 2016-01-08 DIAGNOSIS — Z113 Encounter for screening for infections with a predominantly sexual mode of transmission: Secondary | ICD-10-CM | POA: Insufficient documentation

## 2016-01-08 DIAGNOSIS — R2240 Localized swelling, mass and lump, unspecified lower limb: Secondary | ICD-10-CM | POA: Insufficient documentation

## 2016-01-08 DIAGNOSIS — Z0000 Encounter for general adult medical examination without abnormal findings: Secondary | ICD-10-CM | POA: Diagnosis not present

## 2016-01-08 DIAGNOSIS — F1411 Cocaine abuse, in remission: Secondary | ICD-10-CM

## 2016-01-08 DIAGNOSIS — R2241 Localized swelling, mass and lump, right lower limb: Secondary | ICD-10-CM | POA: Diagnosis not present

## 2016-01-08 DIAGNOSIS — Z01419 Encounter for gynecological examination (general) (routine) without abnormal findings: Secondary | ICD-10-CM | POA: Diagnosis present

## 2016-01-08 DIAGNOSIS — Z202 Contact with and (suspected) exposure to infections with a predominantly sexual mode of transmission: Secondary | ICD-10-CM | POA: Diagnosis not present

## 2016-01-08 DIAGNOSIS — F1410 Cocaine abuse, uncomplicated: Secondary | ICD-10-CM | POA: Diagnosis not present

## 2016-01-08 LAB — BASIC METABOLIC PANEL
BUN: 13 mg/dL (ref 7–25)
CO2: 28 mmol/L (ref 20–31)
Calcium: 10.5 mg/dL — ABNORMAL HIGH (ref 8.6–10.4)
Chloride: 100 mmol/L (ref 98–110)
Creat: 0.95 mg/dL (ref 0.50–1.05)
Glucose, Bld: 88 mg/dL (ref 65–99)
POTASSIUM: 3.7 mmol/L (ref 3.5–5.3)
Sodium: 137 mmol/L (ref 135–146)

## 2016-01-08 LAB — POCT URINALYSIS DIPSTICK
BILIRUBIN UA: NEGATIVE
Glucose, UA: NEGATIVE
Ketones, UA: NEGATIVE
NITRITE UA: NEGATIVE
PH UA: 7
PROTEIN UA: NEGATIVE
RBC UA: NEGATIVE
Spec Grav, UA: 1.015
UROBILINOGEN UA: 0.2

## 2016-01-08 LAB — POCT UA - MICROSCOPIC ONLY

## 2016-01-08 LAB — HEPATITIS PANEL, ACUTE
HCV Ab: NEGATIVE
HEP A IGM: NONREACTIVE
HEP B S AG: NEGATIVE
Hep B C IgM: NONREACTIVE

## 2016-01-08 LAB — TSH: TSH: 1.274 u[IU]/mL (ref 0.350–4.500)

## 2016-01-08 LAB — HIV ANTIBODY (ROUTINE TESTING W REFLEX): HIV: NONREACTIVE

## 2016-01-08 NOTE — Assessment & Plan Note (Signed)
-   Pap Smear  - Screen for HIV, Gonorrhea, Chlamydia, RPR, Hepatitis Panel - BMP, Urinalysis, UDS, TSH - GI referral for colonoscopy made at last office visit. Referral coordinator currently working on scheduling. - Time and location of dermatology follow up given to patient - Incontinence pads, walker, and diabetic shoes will not be prescribed. No diagnosis of diabetes indicated from labs available upon full chart review. Recommended following up with Podiatry concerning shoes. - Will contact Psychiatry about medications and patient follow up

## 2016-01-08 NOTE — Assessment & Plan Note (Signed)
-   Palpable along right anterior thigh. Suspect superficial. Will obtain US to further evaluate.

## 2016-01-08 NOTE — Progress Notes (Signed)
Subjective:     Patient ID: NATAYSHA BUFFKIN, female   DOB: 08/06/1961, 55 y.o.   MRN: [id number]  HPI Mrs. Gambone is a 55yo female presenting today for follow up. - Would like to check status of Dermatology referral. Continues to note rash and itching. - Reports painful mass on thigh of right leg. States she has had it evaluated before and was told it was "nothing", but she is worried it is cancer. Has been present for several years. - Would like STD screening. States she has one partner, but she does not use protection and he has multiple other partners. - Notes significant green vaginal discharge. Denies vaginal tenderness or itching. - Reports increased urine frequency. Denies urgency or dysuria.  - States she is following up with [facility] next week. Denies being on psychiatric medication at this time, however she reported she was on several psych medications at last visit. - Requests walker, diabetic shoes, and incontinence pads. - Last TSH 2008 - Last BMP 10/2014 - No pap smear in EMR  Review of Systems Per HPI. Other systems negative.    Objective:   Physical Exam  Constitutional: She appears well-developed and well-nourished. No distress.  HENT:  Head: Normocephalic and atraumatic.  Mouth/Throat: No oropharyngeal exudate.  Neck: No thyromegaly present.  Cardiovascular: Normal rate and regular rhythm.  Exam reveals no gallop and no friction rub.   No murmur heard. Pulmonary/Chest: Effort normal. No respiratory distress. She has no wheezes. She has no rales.  Abdominal: Soft. She exhibits no distension. There is no tenderness.  Genitourinary:  Significant vaginal discharge noted making visualization of cervix difficult, no cervical motion tenderness noted  Musculoskeletal: She exhibits no edema.  Palpable mass along right anterior thigh, tender.  Psychiatric:  Improved psychomotor agitation from last office visit, no suicidal or homicidal ideation       Assessment and Plan:     Health care maintenance - Pap Smear  - Screen for HIV, Gonorrhea, Chlamydia, RPR, Hepatitis Panel - BMP, Urinalysis, UDS, TSH - GI referral for colonoscopy made at last office visit. Referral coordinator currently working on scheduling. - Time and location of dermatology follow up given to patient - Incontinence pads, walker, and diabetic shoes will not be prescribed. No diagnosis of diabetes indicated from labs available upon full chart review. Recommended following up with Podiatry concerning shoes. - Will contact Psychiatry about medications and patient follow up   Mass of lower extremity - Palpable along right anterior thigh. Suspect superficial. Will obtain US to further evaluate.

## 2016-01-08 NOTE — Patient Instructions (Addendum)
Thank you so much for coming to visit today! We will obtain several labs and a pap smear today. We will mail you a letter with the results. Please follow up with your podiatrist for the prescription for shoes. Please follow up with Mental Health next week. You are schedule to see Dr. Nicole Kindred at [facility] on 03/04/2016 at 10:45am.  Please follow up in one year or sooner pending blood work results.  Thanks again! Dr. Gerlean Ren

## 2016-01-09 LAB — CYTOLOGY - PAP

## 2016-01-09 LAB — DRUG SCREEN, URINE
Amphetamine Screen, Ur: NEGATIVE
BARBITURATE QUANT UR: NEGATIVE
Benzodiazepines.: NEGATIVE
Cocaine Metabolites: POSITIVE — AB
Creatinine,U: 65.28 mg/dL
MARIJUANA METABOLITE: NEGATIVE
METHADONE: NEGATIVE
OPIATES: NEGATIVE
PROPOXYPHENE: NEGATIVE
Phencyclidine (PCP): NEGATIVE

## 2016-01-09 LAB — RPR

## 2016-01-09 LAB — CERVICOVAGINAL ANCILLARY ONLY
CHLAMYDIA, DNA PROBE: NEGATIVE
Neisseria Gonorrhea: NEGATIVE

## 2016-01-10 ENCOUNTER — Telehealth: Payer: Self-pay | Admitting: Family Medicine

## 2016-01-10 NOTE — Telephone Encounter (Signed)
CORRECTION: Ultrasound is of the leg

## 2016-01-10 NOTE — Telephone Encounter (Signed)
I have attempted to contact patient twice. Pt does not answer and unable to LVM. Id she calls back, Ultrasound of neck is scheduled for Tues 01/16/16 at 11:00AM at [facility]. I will also send a letter to patient's home address with appt info.

## 2016-01-12 ENCOUNTER — Telehealth: Payer: Self-pay | Admitting: Family Medicine

## 2016-01-12 MED ORDER — METRONIDAZOLE 500 MG PO TABS: 2000.0000 mg | ORAL_TABLET | Freq: Once | ORAL | Status: DC

## 2016-01-12 NOTE — Telephone Encounter (Signed)
Pap smear normal, however Trichomonas noted. Given psychiatric concerns with compliance, will treat with Metronidazole 2g x1. Will recommend treatment of partner and avoidance of sexual intercourse until both are treated.   [facility], which Mrs. Haen reported following with. They stated her chart was closed and that she saw them twice in 2014. No future visits scheduled. Not currently prescribed medication by them. Strongly recommend re-establishing with Psychiatry.  Attempted to contact to discuss above without response. Will continue to attempt contact.

## 2016-01-15 NOTE — Telephone Encounter (Signed)
Contacted concerning labs, Metronidazole, and follow up with Mental Health. States she is filling out paper work to get re-established at [facility].

## 2016-01-16 ENCOUNTER — Ambulatory Visit (HOSPITAL_COMMUNITY): Admission: RE | Admit: 2016-01-16 | Payer: Medicaid Other | Source: Ambulatory Visit

## 2016-01-19 ENCOUNTER — Other Ambulatory Visit: Payer: Self-pay | Admitting: Gastroenterology

## 2016-01-26 ENCOUNTER — Ambulatory Visit (HOSPITAL_COMMUNITY): Payer: Medicaid Other

## 2016-01-29 ENCOUNTER — Ambulatory Visit (HOSPITAL_COMMUNITY)
Admission: RE | Admit: 2016-01-29 | Discharge: 2016-01-29 | Disposition: A | Discharge status: Home / Self Care | Payer: Medicaid Other | Source: Ambulatory Visit | Attending: Family Medicine | Admitting: Family Medicine

## 2016-01-29 ENCOUNTER — Other Ambulatory Visit: Payer: Self-pay | Admitting: Family Medicine

## 2016-01-29 DIAGNOSIS — R2241 Localized swelling, mass and lump, right lower limb: Secondary | ICD-10-CM | POA: Insufficient documentation

## 2016-02-01 ENCOUNTER — Telehealth: Payer: Self-pay | Admitting: Family Medicine

## 2016-02-01 DIAGNOSIS — D179 Benign lipomatous neoplasm, unspecified: Secondary | ICD-10-CM

## 2016-02-01 NOTE — Telephone Encounter (Signed)
Pt wants to know her test results. She wants to speak to jeanette about not getting her results sooner

## 2016-02-01 NOTE — Telephone Encounter (Addendum)
Spoke to Rhonda Dominguez. Informed her of the below information. Pt would like Dr. Gerlean Ren to give her a call. She wants to know how to fix her leg. The pain is going all down her leg and her leg will give out if she walks to long. She doesn't want to fall. Wants to know why her leg is like this. Ottis Stain, CMA

## 2016-02-01 NOTE — Telephone Encounter (Signed)
Please let Rhonda Dominguez know the ultrasound on 2/20 was normal without signs of malignancy. Suspect Lipoma, which was already discussed in detail with patient at her last office visit as the most likely cause of the mass in her leg. It is a benign lesion and no further evaluation or treatment is necessary.  Already contacted on 01/15/16 and spoke with [name] concerning labs drawn at last office visit. She expressed no concerns or questions at that time.

## 2016-02-06 NOTE — Telephone Encounter (Signed)
Contacted Mrs. Baller concerning results. Suspect mass is lipoma, which is benign. She states she always has pain around the lesion and is always hitting on it. Requests removal. Will place General Surgery referral for removal.   Also followed up on psychiatric visit. States she has completed her paperwork and has been to visit psychiatry.  Dr. Gerlean Ren 02/06/16, 1:41 PM

## 2016-02-12 ENCOUNTER — Other Ambulatory Visit: Payer: Self-pay | Admitting: Family Medicine

## 2016-02-12 ENCOUNTER — Other Ambulatory Visit: Payer: Self-pay | Admitting: *Deleted

## 2016-02-12 DIAGNOSIS — J42 Unspecified chronic bronchitis: Secondary | ICD-10-CM

## 2016-02-14 MED ORDER — ALBUTEROL SULFATE HFA 108 (90 BASE) MCG/ACT IN AERS: 2.0000 | INHALATION_SPRAY | Freq: Four times a day (QID) | RESPIRATORY_TRACT | Status: DC | PRN

## 2016-02-26 ENCOUNTER — Telehealth: Payer: Self-pay | Admitting: *Deleted

## 2016-02-26 NOTE — Telephone Encounter (Signed)
LVM for pt to call back to inform her that we had received a Medicaid Transportation Exception Verification form to be filled out, however her portion of the form has not been completed.  She will need to complete it and then we can complete the form and fax it back. Placing up front for her to complete her portion and then once complete please route to PCP for completion. Katharina Caper, Xolani Degracia D, [name]

## 2016-02-26 NOTE — Telephone Encounter (Signed)
Prior to pt filling out her portion she had called and stated that she would have someone bring her up her to fill her portion out.  Form was completed and routed to PCP according to previous note. Katharina Caper, April D, [name]

## 2016-02-26 NOTE — Telephone Encounter (Signed)
Pt filled out the form. Placing the form in Dr. Johnathan Hausen box for her to fill out. Ottis Stain, CMA

## 2016-02-28 NOTE — Telephone Encounter (Signed)
Please ask her to specify why she needs this form. Form is for [facility]. This form is to request transportation outside of service area, a particular mode of transportation, or lodging.

## 2016-03-01 NOTE — Telephone Encounter (Signed)
LVM for pt to call back to find out the information requested by dr. Gerlean Ren about the form she received. If pt calls back please get this information and forward to white team so we can get this to Dr. Gerlean Ren.  Katharina Caper, Tylen Leverich D, [name]

## 2016-03-02 ENCOUNTER — Telehealth: Payer: Self-pay | Admitting: Family Medicine

## 2016-03-02 NOTE — Telephone Encounter (Signed)
Called the Emergency Phone line this weekend and I was the one on call. States she needs transportation to [place] for her dermatology appointments. Will fill out form and return.

## 2016-03-02 NOTE — Telephone Encounter (Signed)
Emergency Phone Line:  Returned phone call by Mrs. [place]. States she received several missed phone calls yesterday and wanted to know what they were calling about. Discussed that this is not a good reason to call the Emergency Phone Line and that this is something that would ideally wait until the office is open during the week.  Phone call was concerning Medicaid Transportation Exception Verification form. States she goes to the dermatologist in [place] and they required the form to be filled out so they could provide transportation outside of the county. No further concerns today. Apologetic for calling emergency phone line.  Dr. Gerlean Ren 03/02/16, 12:03 PM

## 2016-03-05 NOTE — Telephone Encounter (Signed)
Patient informed that Medicaid Transportation exception form was completed and faxed to [facility] [place]. Original form placed up front for pickup. Derl Barrow, RN

## 2016-03-07 ENCOUNTER — Other Ambulatory Visit: Payer: Self-pay | Admitting: General Surgery

## 2016-03-18 ENCOUNTER — Telehealth: Payer: Self-pay | Admitting: Family Medicine

## 2016-03-20 ENCOUNTER — Encounter: Payer: Self-pay | Admitting: Family Medicine

## 2016-03-20 ENCOUNTER — Ambulatory Visit (INDEPENDENT_AMBULATORY_CARE_PROVIDER_SITE_OTHER): Payer: Medicaid Other | Admitting: Family Medicine

## 2016-03-20 VITALS — BP 130/90 | HR 83 | Temp 98.2°F | Ht 66.0 in | Wt 162.0 lb

## 2016-03-20 DIAGNOSIS — I10 Essential (primary) hypertension: Secondary | ICD-10-CM

## 2016-03-20 DIAGNOSIS — M79604 Pain in right leg: Secondary | ICD-10-CM

## 2016-03-20 MED ORDER — HYDROCHLOROTHIAZIDE 25 MG PO TABS: 25.0000 mg | ORAL_TABLET | Freq: Two times a day (BID) | ORAL | Status: DC

## 2016-03-20 MED ORDER — TRIAMCINOLONE ACETONIDE 0.1 % EX CREA: 1.0000 "application " | TOPICAL_CREAM | Freq: Two times a day (BID) | CUTANEOUS | Status: DC | PRN

## 2016-03-20 MED ORDER — HYDROXYZINE HCL 25 MG PO TABS: 25.0000 mg | ORAL_TABLET | Freq: Four times a day (QID) | ORAL | Status: DC | PRN

## 2016-03-20 NOTE — Patient Instructions (Signed)
Thank you so much for coming to visit me today! I have written the letter as requested.  Please follow up with [facility]! I'm so glad you have an appointment there!  Please let me know if there is anything else I can do for you! Dr. Gerlean Ren

## 2016-03-22 DIAGNOSIS — I10 Essential (primary) hypertension: Secondary | ICD-10-CM | POA: Insufficient documentation

## 2016-03-22 NOTE — Assessment & Plan Note (Signed)
-   Reports pain over right anterior thigh at previous surgical site and right knee - Prefers OTC treatment. May continue. - Continue use of cane as needed to assist with ambulation - Letter given to assist with move to ground level apartment if possible. - Follow up with Surgery  - Encouraged follow up with [facility]. Again discussed that this is essential for her health. - Follow up PRN

## 2016-03-22 NOTE — Progress Notes (Signed)
Subjective:     Patient ID: Rhonda Dominguez, female   DOB: 1961-11-15, 55 y.o.   MRN: [id number]  HPI [name] is a 55yo presenting today due to need for letter. - Previous surgery to remove lipoma on right anterior thigh - Has chronic flares of knee and hip pain occasionally. Now with new thigh pain and change of gait, has had flare of pain along right knee and hip. - States that while she is in pain, she is not requesting pain medication at this visit. Does not like strong medications and prefers to treat OTC. - Does request letter to apartment complex for move to first floor if possible. States she has to climb 15 stairs to her current apartment and while it is difficult at baseline it has been even more difficult the past several months. - Ambulates with cane with flares of pain. Sometimes may not require assistance with ambulation. - Requests refills of medication. - Reports she has appointment with [facility] 4/25.  - Reports she has follow up with surgery as well on 4/27  Review of Systems Per HPI. Other systems negative.    Objective:   Physical Exam  Constitutional: She appears well-developed and well-nourished. No distress.  Cardiovascular: Normal rate and regular rhythm.  Exam reveals no gallop and no friction rub.   No murmur heard. Pulmonary/Chest: Effort normal. No respiratory distress. She has no wheezes. She has no rales.  Musculoskeletal:  Tenderness of right paraspinal muscles. Hip ROM symmetric bilaterally. Crepitus in right knee.  Skin:  Surgical incision healing well with sutures in place      Assessment and Plan:     Right leg pain - Reports pain over right anterior thigh at previous surgical site and right knee - Prefers OTC treatment. May continue. - Continue use of cane as needed to assist with ambulation - Letter given to assist with move to ground level apartment if possible. - Follow up with Surgery  - Encouraged follow up with [facility]. Again  discussed that this is essential for her health. - Follow up PRN  Essential hypertension - Goal <150/90 - At goal. Well controlled. - HCTZ refilled

## 2016-03-22 NOTE — Assessment & Plan Note (Signed)
-   Goal <150/90 - At goal. Well controlled. - HCTZ refilled

## 2016-03-25 ENCOUNTER — Telehealth: Payer: Self-pay | Admitting: *Deleted

## 2016-03-25 NOTE — Telephone Encounter (Signed)
Patient was in the office of her case manager Ms. Elgie Congo of [employer].  Patient is requesting that PCP call Ms. Elgie Congo back ASAP regarding paperwork that will be received from [employer]).  Patient also requesting a new letter be written regarding moving to ground floor apartment.  Patient stated she has found another apartment complex to move into.  It need to state ok for her move to another aparmtent on ground floor due to recent surgery.  Patient request to call Ms. Elgie Congo back before the paperwork is complete and faxed back to [employer].  Please give Ms. Elgie Congo a call [phone] today if possible.  Derl Barrow, RN

## 2016-03-26 NOTE — Telephone Encounter (Signed)
Attempted to contact without response. Will continue to attempt contact.  

## 2016-03-28 NOTE — Telephone Encounter (Signed)
Patients calls back. Paper from [employer] was placed in Dr. Johnathan Hausen box yesterday.  Pt request that paperwork states that she move into an totally new apartment complex.

## 2016-03-29 NOTE — Telephone Encounter (Signed)
Paperwork will not be complete until Monday. Will be completed and faxed 4/24.

## 2016-03-29 NOTE — Telephone Encounter (Signed)
Patient states she needs paperwork faxed today.

## 2016-04-01 NOTE — Telephone Encounter (Signed)
Paperwork completed and faxed after discussion with [employer] concerning appropriateness. Currently with surgical wound on right thigh resulting in significant pain and limitations climbing stairs. Forms completed and faxed, however documented that signature of the form does not indicate that this is a chronic disability.

## 2016-04-11 ENCOUNTER — Encounter: Payer: Self-pay | Admitting: Family Medicine

## 2016-04-11 ENCOUNTER — Ambulatory Visit (INDEPENDENT_AMBULATORY_CARE_PROVIDER_SITE_OTHER): Payer: Medicaid Other | Admitting: Family Medicine

## 2016-04-11 VITALS — BP 134/96 | HR 79 | Temp 98.5°F | Ht 67.0 in | Wt 163.0 lb

## 2016-04-11 DIAGNOSIS — M25571 Pain in right ankle and joints of right foot: Secondary | ICD-10-CM

## 2016-04-11 DIAGNOSIS — H269 Unspecified cataract: Secondary | ICD-10-CM | POA: Diagnosis not present

## 2016-04-11 DIAGNOSIS — M79674 Pain in right toe(s): Secondary | ICD-10-CM

## 2016-04-11 DIAGNOSIS — J42 Unspecified chronic bronchitis: Secondary | ICD-10-CM | POA: Diagnosis not present

## 2016-04-11 MED ORDER — ALBUTEROL SULFATE HFA 108 (90 BASE) MCG/ACT IN AERS: 2.0000 | INHALATION_SPRAY | Freq: Four times a day (QID) | RESPIRATORY_TRACT | Status: DC | PRN

## 2016-04-11 NOTE — Patient Instructions (Signed)
Thank you so much for coming to visit! I have placed a referral to Podiatry [facility]) and Ophthalmology (Dr. Patrice Paradise); they should contact you soon to set up an appointment.  I have ordered a refill of your albuterol. Please schedule an appointment with Dr. Valentina Lucks for PFTs.  Follow up after your PFT tests.  Please continue to follow up with [facility].    Thanks again! Dr. Gerlean Ren

## 2016-04-13 DIAGNOSIS — M79676 Pain in unspecified toe(s): Secondary | ICD-10-CM | POA: Insufficient documentation

## 2016-04-13 NOTE — Assessment & Plan Note (Addendum)
-   Reports history, although no PFTs or hospitalizations documented in Epic. Note patient has history of falsifying diagnoses. Will treat given possibility that COPD is present, however further workup warranted. - To follow up with [facility] for PFTs - Prescription for one albuterol inhaler given

## 2016-04-13 NOTE — Assessment & Plan Note (Signed)
-   History of foot surgery in 2016. Agree with referral to Podiatry. Order placed.

## 2016-04-13 NOTE — Assessment & Plan Note (Signed)
-   Agree with referral to Ophthalmology. Order placed.

## 2016-04-13 NOTE — Progress Notes (Signed)
Subjective:     Patient ID: Rhonda Dominguez, female   DOB: [birth year], 55 y.o.   MRN: [id number]  HPI Rhonda Dominguez is a 55yo female presenting today with need of several referrals. - Requests referral to Podiatry.  Reports worsening right great toe pain extending up leg to right thigh. Notes difficulty moving toe. Has been present for several months. Denies redness or increased warmth.  Chart Review:  Was seen by Orthopedics in 2016, who recommended referral to Podiatry.  Was seen by Podiatry in 02/2015. Diagnosed with Hallux limitus rigidus of right MPJ. Recommended biplanar type osteotomy with pin fixation and spur removal, which was done later that month. - Requests referral to Ophthalmology.  Reports history of cataracts surgery and that she was supposed to follow up with Ophthalmology but has not done so.   Chart Review:  Seen by Ophthalmology in 2008. Had bilateral cataract surgery in 2008. - Requests refill of Albuterol.  Reports history of COPD, however no PFTs or hospitalizations for COPD have been documented in Epic. - Brought proof that she was seen by [facility] on 5/2. Next appointment scheduled for 5/31. Prescribed Buproprionn XL 150mg     Review of Systems Per HPI. Other systems negative.    Objective:   Physical Exam  Constitutional: She appears well-developed and well-nourished. No distress.  HENT:  Head: Normocephalic and atraumatic.  Cardiovascular: Normal rate and regular rhythm.  Exam reveals no gallop and no friction rub.   No murmur heard. Pulmonary/Chest: Effort normal. No respiratory distress. She has no wheezes.  Musculoskeletal:  Tenderness over right great toe without redness or warmth, decreased ROM or right great toe      Assessment and Plan:     BRONCHITIS, CHRONIC - Reports history, although no PFTs or hospitalizations documented in Epic. Note patient has history of falsifying diagnoses. Will treat given possibility that COPD is present, however  further workup warranted. - To follow up with [facility] for PFTs - Prescription for one albuterol inhaler given  CATARACTS, BILATERAL - Agree with referral to Ophthalmology. Order placed.  Toe pain - History of foot surgery in 2016. Agree with referral to Podiatry. Order placed.

## 2016-04-15 ENCOUNTER — Emergency Department (HOSPITAL_COMMUNITY)
Admission: EM | Admit: 2016-04-15 | Discharge: 2016-04-15 | Disposition: A | Discharge status: Home / Self Care | Payer: Medicaid Other | Attending: Emergency Medicine | Admitting: Emergency Medicine

## 2016-04-15 ENCOUNTER — Encounter (HOSPITAL_COMMUNITY): Payer: Self-pay | Admitting: *Deleted

## 2016-04-15 ENCOUNTER — Emergency Department (HOSPITAL_BASED_OUTPATIENT_CLINIC_OR_DEPARTMENT_OTHER)
Admit: 2016-04-15 | Discharge: 2016-04-15 | Disposition: A | Discharge status: Home / Self Care | Payer: Medicaid Other | Attending: Emergency Medicine | Admitting: Emergency Medicine

## 2016-04-15 DIAGNOSIS — Z8709 Personal history of other diseases of the respiratory system: Secondary | ICD-10-CM | POA: Diagnosis not present

## 2016-04-15 DIAGNOSIS — F329 Major depressive disorder, single episode, unspecified: Secondary | ICD-10-CM | POA: Insufficient documentation

## 2016-04-15 DIAGNOSIS — F17210 Nicotine dependence, cigarettes, uncomplicated: Secondary | ICD-10-CM | POA: Diagnosis not present

## 2016-04-15 DIAGNOSIS — E119 Type 2 diabetes mellitus without complications: Secondary | ICD-10-CM | POA: Insufficient documentation

## 2016-04-15 DIAGNOSIS — M79609 Pain in unspecified limb: Secondary | ICD-10-CM

## 2016-04-15 DIAGNOSIS — I10 Essential (primary) hypertension: Secondary | ICD-10-CM | POA: Diagnosis not present

## 2016-04-15 DIAGNOSIS — Z79899 Other long term (current) drug therapy: Secondary | ICD-10-CM | POA: Diagnosis not present

## 2016-04-15 DIAGNOSIS — M79604 Pain in right leg: Secondary | ICD-10-CM | POA: Diagnosis not present

## 2016-04-15 MED ORDER — TRAMADOL HCL 50 MG PO TABS: 50.0000 mg | ORAL_TABLET | Freq: Four times a day (QID) | ORAL | Status: DC | PRN

## 2016-04-15 MED ORDER — CYCLOBENZAPRINE HCL 10 MG PO TABS: 10.0000 mg | ORAL_TABLET | Freq: Two times a day (BID) | ORAL | Status: DC | PRN

## 2016-04-15 MED ORDER — IBUPROFEN 400 MG PO TABS
800.0000 mg | ORAL_TABLET | Freq: Once | ORAL | Status: AC
  Administered 2016-04-15: 800 mg via ORAL
  Filled 2016-04-15: qty 2

## 2016-04-15 NOTE — ED Provider Notes (Signed)
CSN: [id number]     Arrival date & time 04/15/16  [id number] History  By signing my name below, I, Rhonda Dominguez, attest that this documentation has been prepared under the direction and in the presence of Domenic Moras, PA-C. Electronically Signed: Sonum Dominguez, Education administrator. 04/15/2016. 11:17 AM.    Chief Complaint  Patient presents with  . Leg Pain    The history is provided by the patient. No language interpreter was used.     HPI Comments: Rhonda Dominguez is a 55 y.o. female who presents to the Emergency Department complaining of 1 week of constant, gradually worsening right foot pain with radiation to the right mid thigh. She describes her pain as sharp and aching in nature. She had right foot surgery 2 months ago. Dr. Marlou Starks excised a lipoma from the right thigh and has ordered imaging of the right femur. She denies history of DVT/PE, long periods of immobilization, hormone replacement therapy. She denies numbness, bowel or bladder incontinence.   Past Medical History  Diagnosis Date  . Depression   . Hypertension   . Pneumothorax   . Drug abuse   . Diabetes mellitus without complication ([place])    History reviewed. No pertinent past surgical history. History reviewed. No pertinent family history. Social History  Substance Use Topics  . Smoking status: Current Every Day Smoker -- 0.50 packs/day  . Smokeless tobacco: None  . Alcohol Use: Yes     Comment: occ   OB History    No data available     Review of Systems  Gastrointestinal:       -bowel/bladder incontinence   Musculoskeletal: Positive for myalgias and arthralgias.  Neurological: Negative for numbness.      Allergies  Review of patient's allergies indicates no known allergies.  Home Medications   Prior to Admission medications   Medication Sig Start Date End Date Taking? Authorizing Provider  albuterol (PROVENTIL HFA;VENTOLIN HFA) 108 (90 Base) MCG/ACT inhaler Inhale 2 puffs into the lungs every 6 (six) hours as needed for  wheezing or shortness of breath. 04/11/16   Chance N Rumley, DO  hydrochlorothiazide (HYDRODIURIL) 25 MG tablet Take 1 tablet (25 mg total) by mouth 2 (two) times daily. 03/20/16   [place] John N Rumley, DO  hydrOXYzine (ATARAX/VISTARIL) 25 MG tablet Take 1 tablet (25 mg total) by mouth every 6 (six) hours as needed for itching. 03/20/16   [place] N Rumley, DO  metroNIDAZOLE (FLAGYL) 500 MG tablet Take 4 tablets (2,000 mg total) by mouth once. 01/12/16   Bieber N Rumley, DO  nicotine (NICODERM CQ) 14 mg/24hr patch Place 1 patch (14 mg total) onto the skin daily. 12/07/15   Mariel Aloe, MD  triamcinolone cream (KENALOG) 0.1 % Apply 1 application topically 2 (two) times daily as needed. 03/20/16   Stephenson N Rumley, DO   BP 116/44 mmHg  Pulse 60  Temp(Src) 98.1 F (36.7 C) (Oral)  Resp 18  SpO2 100% Physical Exam  Constitutional: She is oriented to person, place, and time. She appears well-developed and well-nourished.  HENT:  Head: Normocephalic and atraumatic.  Cardiovascular: Normal rate.   Pulmonary/Chest: Effort normal.  Musculoskeletal: She exhibits tenderness. She exhibits no edema.  Right foot: tenderness throughout without focal point tenderness. No obvious deformity. Normal skin tone, DP pulses palpable. Tenderness to right calf on palpation. No pedal edema. Tenderness through entire right leg on palpation. No palpable chords, erythema, or edema.   Neurological: She is alert and oriented to person, place, and time.  Skin: Skin is warm and dry.  Psychiatric: She has a normal mood and affect.  Nursing note and vitals reviewed.   ED Course  Procedures (including critical care time)  DIAGNOSTIC STUDIES: Oxygen Saturation is 100% on RA, normal by my interpretation.    COORDINATION OF CARE: 11:20 AM Will order ultrasound to rule out DVT. Discussed treatment plan with pt at bedside and pt agreed to plan. Doubt claudication.  No significant back pain, less likely sciatica.       Author:  Janifer Adie, RVT Service: Vascular Lab Author Type: Cardiovascular Sonographer    Filed: 04/15/2016 12:50 PM Note Time: 04/15/2016 12:49 PM Status: Signed   Editor: Janifer Adie, RVT (Cardiovascular Sonographer)     Expand All Collapse All   VASCULAR LAB PRELIMINARY PRELIMINARY PRELIMINARY PRELIMINARY  Right lower extremity venous duplex completed.   Right: No evidence of DVT, superficial thrombosis, or Baker's cyst.  Gave result to the patient's nurse.  Janifer Adie, RVT, RDMS 04/15/2016, 12:49 PM       MDM   Final diagnoses:  Right leg pain   BP 116/44 mmHg  Pulse 60  Temp(Src) 98.1 F (36.7 C) (Oral)  Resp 18  SpO2 100%  I personally performed the services described in this documentation, which was scribed in my presence. The recorded information has been reviewed and is accurate.   1:07 PM Pt with R leg pain.  No significant back pain to suggest sciatica.  DVT study is negative.  No signs of infection.  Pt is NVI.  Recommend f/u with her PCP or ortho for further care.  Will prescribed pain medication and muscle relaxant for sxs relief.    Domenic Moras, PA-C 04/15/16 [place], MD 04/15/16 [phone]

## 2016-04-15 NOTE — ED Notes (Signed)
Pt reports pain from right toe and up her entire leg. No obv injury, swelling or redness noted.

## 2016-04-15 NOTE — Progress Notes (Signed)
VASCULAR LAB PRELIMINARY  PRELIMINARY  PRELIMINARY  PRELIMINARY  Right lower extremity venous duplex completed.    Right:  No evidence of DVT, superficial thrombosis, or Baker's cyst.  Gave result to the patient's nurse.  Janifer Adie, RVT, RDMS 04/15/2016, 12:49 PM

## 2016-04-15 NOTE — ED Notes (Signed)
Declined W/C at D/C and was escorted to lobby by RN. 

## 2016-04-15 NOTE — Discharge Instructions (Signed)

## 2016-04-15 NOTE — ED Notes (Signed)
Vascular tech remains at bedside

## 2016-04-17 ENCOUNTER — Ambulatory Visit: Payer: Medicaid Other | Admitting: Podiatry

## 2016-04-18 ENCOUNTER — Encounter: Payer: Self-pay | Admitting: Podiatry

## 2016-04-18 ENCOUNTER — Ambulatory Visit (INDEPENDENT_AMBULATORY_CARE_PROVIDER_SITE_OTHER): Payer: Medicaid Other

## 2016-04-18 ENCOUNTER — Ambulatory Visit (INDEPENDENT_AMBULATORY_CARE_PROVIDER_SITE_OTHER): Payer: Medicaid Other | Admitting: Podiatry

## 2016-04-18 VITALS — BP 156/99 | HR 83 | Resp 16

## 2016-04-18 DIAGNOSIS — M79676 Pain in unspecified toe(s): Secondary | ICD-10-CM

## 2016-04-18 DIAGNOSIS — M722 Plantar fascial fibromatosis: Secondary | ICD-10-CM | POA: Diagnosis not present

## 2016-04-18 DIAGNOSIS — M779 Enthesopathy, unspecified: Principal | ICD-10-CM

## 2016-04-18 DIAGNOSIS — B351 Tinea unguium: Secondary | ICD-10-CM | POA: Diagnosis not present

## 2016-04-18 DIAGNOSIS — M778 Other enthesopathies, not elsewhere classified: Secondary | ICD-10-CM

## 2016-04-18 DIAGNOSIS — M7751 Other enthesopathy of right foot: Secondary | ICD-10-CM

## 2016-04-18 DIAGNOSIS — M205X1 Other deformities of toe(s) (acquired), right foot: Secondary | ICD-10-CM

## 2016-04-18 MED ORDER — TRIAMCINOLONE ACETONIDE 10 MG/ML IJ SUSP
10.0000 mg | Freq: Once | INTRAMUSCULAR | Status: AC
  Administered 2016-04-18: 10 mg

## 2016-04-18 NOTE — Progress Notes (Signed)
Subjective:     Patient ID: Rhonda Dominguez, female   DOB: [birth year], 55 y.o.   MRN: [id number]  HPI patient presents stating that she had a lesion removed from her right thigh and she developed pain in the bottom of her foot as she recovered   Review of Systems     Objective:   Physical Exam Neurovascular status unchanged with inflammation in the mid arch area right with fluid buildup and moderate to severe reduction of motion first MPJ right which is nonpainful    Assessment:     Appears to be acute plantar fasciitis right mid arch along with hallux limitus condition    Plan:     Educated her on hallux limitus explaining condition and injected the fascial band 3 mg Kenalog 5 mg Xylocaine. Reappoint to reevaluate  X-rays indicate the osteotomy looks good position of the first metatarsal looks good with mild dorsal elevation of the first metatarsal segment

## 2016-06-10 ENCOUNTER — Ambulatory Visit: Payer: Medicaid Other | Admitting: Family Medicine

## 2016-06-14 ENCOUNTER — Ambulatory Visit (INDEPENDENT_AMBULATORY_CARE_PROVIDER_SITE_OTHER): Payer: Medicaid Other | Admitting: Family Medicine

## 2016-06-14 VITALS — BP 147/114 | HR 79 | Temp 98.3°F | Wt 154.2 lb

## 2016-06-14 DIAGNOSIS — Z0000 Encounter for general adult medical examination without abnormal findings: Secondary | ICD-10-CM | POA: Diagnosis not present

## 2016-06-14 DIAGNOSIS — I10 Essential (primary) hypertension: Secondary | ICD-10-CM | POA: Diagnosis not present

## 2016-06-14 DIAGNOSIS — M79604 Pain in right leg: Secondary | ICD-10-CM

## 2016-06-14 DIAGNOSIS — L309 Dermatitis, unspecified: Secondary | ICD-10-CM

## 2016-06-14 DIAGNOSIS — Z7251 High risk heterosexual behavior: Secondary | ICD-10-CM | POA: Diagnosis not present

## 2016-06-14 DIAGNOSIS — J42 Unspecified chronic bronchitis: Secondary | ICD-10-CM

## 2016-06-14 DIAGNOSIS — Z13220 Encounter for screening for lipoid disorders: Secondary | ICD-10-CM

## 2016-06-14 MED ORDER — BENZTROPINE MESYLATE 1 MG PO TABS: 0.5000 mg | ORAL_TABLET | Freq: Every day | ORAL | Status: DC

## 2016-06-14 MED ORDER — LAMOTRIGINE 25 MG PO TABS: 25.0000 mg | ORAL_TABLET | Freq: Two times a day (BID) | ORAL | Status: DC

## 2016-06-14 MED ORDER — TRIAMCINOLONE ACETONIDE 0.1 % EX CREA: 1.0000 "application " | TOPICAL_CREAM | Freq: Two times a day (BID) | CUTANEOUS | Status: DC | PRN

## 2016-06-14 MED ORDER — BUPROPION HCL ER (XL) 300 MG PO TB24: 300.0000 mg | ORAL_TABLET | Freq: Every day | ORAL | Status: DC

## 2016-06-14 MED ORDER — HYDROCHLOROTHIAZIDE 25 MG PO TABS: 25.0000 mg | ORAL_TABLET | Freq: Two times a day (BID) | ORAL | Status: DC

## 2016-06-14 NOTE — Assessment & Plan Note (Signed)
-   Reported per patient without PFTs documented in chart - Appointment scheduled with [facility] for PFTs

## 2016-06-14 NOTE — Assessment & Plan Note (Signed)
-   Refer to Pain Clinic. - Has tried several months of management by injection per Podiatry. Would like to try other methods of pain management. - History of Cocaine abuse. Reluctant to prescribe home pain medications.

## 2016-06-14 NOTE — Patient Instructions (Addendum)
Thank you so much for coming to visit me today! We will check several labs today. You will be contacted with the results. I have referred you to pain management. I have refilled your blood pressure medication. You may contact your dermatologist at [phone]  Dr. Gerlean Ren

## 2016-06-14 NOTE — Assessment & Plan Note (Signed)
-   HIV - Lipid Panel - Next Mammogram 07/2015 - Next Pap Smear 12/2020 - Attempt to obtain records of Colonoscopy

## 2016-06-14 NOTE — Assessment & Plan Note (Signed)
-  Kenalog refilled. Number for Dermatology given so she can schedule follow up.

## 2016-06-14 NOTE — Progress Notes (Signed)
Subjective:     Patient ID: Rhonda Dominguez, female   DOB: Sep 14, 1961, 55 y.o.   MRN: [id number]  HPI Rhonda Dominguez is a 55yo female presenting for medication refill and right leg pain.  # Hypertension: - Out of HCTZ. Does not recall being prescribed this medication - Denies chest pain, headache, shortness of breath, blurred vision  # Right Lower Extremity Pain: - Pain is chronic. - Located from right anterior thigh at sign of previous lipoma removal to her right foot - Followed by Podiatry. States she has had multiple injections into right foot - Surgery to remove lipoma of right anterior thigh in 02/2016 - Requests referral to Pain Clinic  # Health Maintenance: - Reports she had colonoscopy within last year, however not in our records - Last pap smear 12/2015. Next due 1/22 - Last mammogram 07/2015. - Requests HIV check. Reports intercourse without protection. Worried her partner might have HIV. - Requests refill of Kenolog. Follows with Dermatology. Requests their number so she can follow up. - States she forgot to follow up with Podiatry as requested.  Review of Systems Per HPI. Other systems negative.    Objective:   Physical Exam  Constitutional: She appears well-developed and well-nourished. No distress.  HENT:  Cerumen noted bilaterally. Post-irrigation, tympanic membranes visualized bilaterally, nonerythematous.  Cardiovascular: Normal rate and regular rhythm.   No murmur heard. Pulmonary/Chest: Effort normal. No respiratory distress. She has no wheezes.  Skin:  Excoriations noted on arms bilaterally      Assessment and Plan:     Essential hypertension - HCTZ refilled - Check BP at next office visit.  BRONCHITIS, CHRONIC - Reported per patient without PFTs documented in chart - Appointment scheduled with [facility] for PFTs  Eczema -Kenalog refilled. Number for Dermatology given so she can schedule follow up.  Right leg pain - Refer to Pain Clinic. - Has tried  several months of management by injection per Podiatry. Would like to try other methods of pain management. - History of Cocaine abuse. Reluctant to prescribe home pain medications.  Health care maintenance - HIV - Lipid Panel - Next Mammogram 07/2015 - Next Pap Smear 12/2020 - Attempt to obtain records of Colonoscopy

## 2016-06-14 NOTE — Assessment & Plan Note (Signed)
-   HCTZ refilled - Check BP at next office visit.

## 2016-06-15 LAB — LIPID PANEL
Cholesterol: 182 mg/dL (ref 125–200)
HDL: 78 mg/dL (ref >=46)
LDL Cholesterol: 83 mg/dL (ref <130)
TRIGLYCERIDES: 107 mg/dL (ref <150)
Total CHOL/HDL Ratio: 2.3 Ratio (ref <=5.0)
VLDL: 21 mg/dL (ref <30)

## 2016-06-15 LAB — HIV ANTIBODY (ROUTINE TESTING W REFLEX): HIV: NONREACTIVE

## 2016-06-18 ENCOUNTER — Telehealth: Payer: Self-pay | Admitting: Family Medicine

## 2016-06-18 NOTE — Telephone Encounter (Signed)
Pt informed of below. Zimmerman Rumple, April D, CMA  

## 2016-06-18 NOTE — Telephone Encounter (Signed)
Please let Mrs. Bonitz know her labs were normal.

## 2016-06-25 ENCOUNTER — Telehealth: Payer: Self-pay | Admitting: *Deleted

## 2016-06-25 NOTE — Telephone Encounter (Signed)
Pt wants dr to call her, it is very important. Wouldn't leave any details. Please advise. Deseree Kennon Holter, CMA

## 2016-06-26 NOTE — Telephone Encounter (Signed)
Saw [name] at [facility]. States she has lost her letter to give to her apartment complex requesting move to a lower floor. Will print letter and leave at front desk for patient to pick up on 7/20.

## 2016-07-02 ENCOUNTER — Ambulatory Visit (INDEPENDENT_AMBULATORY_CARE_PROVIDER_SITE_OTHER): Payer: Medicaid Other | Admitting: Pharmacist

## 2016-07-02 ENCOUNTER — Encounter: Payer: Self-pay | Admitting: Pharmacist

## 2016-07-02 DIAGNOSIS — J42 Unspecified chronic bronchitis: Secondary | ICD-10-CM | POA: Diagnosis not present

## 2016-07-02 DIAGNOSIS — F17200 Nicotine dependence, unspecified, uncomplicated: Secondary | ICD-10-CM

## 2016-07-02 DIAGNOSIS — I10 Essential (primary) hypertension: Secondary | ICD-10-CM

## 2016-07-02 MED ORDER — AMLODIPINE BESYLATE 5 MG PO TABS: 5.0000 mg | ORAL_TABLET | Freq: Every day | ORAL | 1 refills | Status: DC

## 2016-07-02 NOTE — Progress Notes (Signed)
Patient ID: Rhonda Dominguez, female   DOB: 06/16/1961, 55 y.o.   MRN: [id number] Reviewed: Agree with Dr. Graylin Shiver documentation and management.

## 2016-07-02 NOTE — Assessment & Plan Note (Signed)
Moderate Nicotine Dependence of many (> 30) years duration in a patient who is fair candidate for success b/c of currently level of motivation.   Encouraged patient to initiate current supply of nicotine replacement tx patches. Patient counseled on purpose, proper use, and potential adverse effects, including irritation.

## 2016-07-02 NOTE — Patient Instructions (Signed)
Start taking Dulera - please take this medicine Two puffs twice daily.   Continue to use this medicine until you return in two weeks for the lung function test.    Please reduce you HCTZ blood pressure pill to once daily in the morning.    Please start amlodipine 5mg  once daily in the evening.      Keep working on staying off cigarettes.  You are a 10/10 on confidence today to quit for the next two weeks.   Please bring all your medicines to your next visit in two weeks.

## 2016-07-02 NOTE — Progress Notes (Signed)
   S:  Patient arrives to clinic > 20 minutes late for scheduled appointment.   Patient arrives for evaluation of dyspnea.  Patient states she did NOT want to come to the visit and states she is NOT feeling well.  She complained about her breathing however she was able to communicate in long sentences and multiple sentences without pausing.  She states she has NOT smoked for 3 days.   She is NOT currently using patches however intends to use them to keep herself from smoking.  She appears anxious, nervous and has some pressured speech.   She is modestly agitated.    Age when started using tobacco on a daily basis 27. Number of Cigarettes per day 20. Brand smoked [place]. Estimated Nicotine Content per Cigarette (mg) 1.2.  Estimated Nicotine intake per day >20mg .    Estimated Fagerstrom Score >5/10.   Most recent quit attempt several years ago. Longest time ever been tobacco free - six months.   Medications (NRT, bupropion, varenicline) used in prior in past cessation efforts include: patches. .   Rates IMPORTANCE of quitting tobacco on 1-10 scale of 10. Rates CONFIDENCE of quitting tobacco on 1-10 scale of 10 for at least 1 month.   Most common triggers to use tobacco include; stress.    Motivation to quit: health and persistent dyspnea.   She states her breathing is very concerning to her.    States she has a pill that is 500mg  daily but did NOT know what that was.   Asked to bring all medications to next visit.  Suspect a mental health prescribed agent possibly valproic acid.   A/P: Moderate Nicotine Dependence of many (> 30) years duration in a patient who is fair candidate for success b/c of currently level of motivation.   Encouraged patient to initiate current supply of nicotine replacement tx patches. Patient counseled on purpose, proper use, and potential adverse effects, including irritation.   Dyspnea, persistent and concerning, moderate dyspnea which is hard to evaluate with  spirometry due to her current level of agitation.  She is willing to come back in 2 weeks, bringing all medications and allowing [place] to evaluate dyspnea following use of Dulera 2 inhalations BID.  Medication Samples have been provided to the patient.  Drug name: Ruthe Mannan     Strength: 200/5mg         Qty: 1  [id number]  Exp.Date: 06/08/2017  Dosing instructions: 2 inhalations BID  The patient has been instructed regarding the correct time, dose, and frequency of taking this medication, including desired effects and most common side effects.   Leavy Cella 4:35 PM 07/02/2016  Written information provided.  Total time in face-to-face counseling 35 minutes.

## 2016-07-02 NOTE — Assessment & Plan Note (Signed)
Moderate Nicotine Dependence of many (> 30) years duration in a patient who is fair candidate for success b/c of currently level of motivation.   Encouraged patient to initiate current supply of nicotine replacement tx patches. Patient counseled on purpose, proper use, and potential adverse effects, including irritation.   Dyspnea, persistent and concerning, moderate dyspnea which is hard to evaluate with spirometry due to her current level of agitation.  She is willing to come back in 2 weeks, bringing all medications and allowing [place] to evaluate dyspnea following use of Dulera 2 inhalations BID.   Spirometry planned at next visit.

## 2016-07-03 ENCOUNTER — Ambulatory Visit: Payer: Medicaid Other | Admitting: Student

## 2016-07-10 ENCOUNTER — Telehealth: Payer: Self-pay | Admitting: Family Medicine

## 2016-07-10 NOTE — Telephone Encounter (Signed)
Attempted to contact without response. 

## 2016-07-10 NOTE — Telephone Encounter (Signed)
Pt called stating she needed her doctor to call her and that it was urgent. Pt did not give anymore information. ep

## 2016-07-11 NOTE — Telephone Encounter (Signed)
Attempted to contact without response. 

## 2016-07-15 ENCOUNTER — Ambulatory Visit: Payer: Medicaid Other | Admitting: Pharmacist

## 2016-07-15 ENCOUNTER — Encounter: Payer: Self-pay | Admitting: Student-PharmD

## 2016-07-15 DIAGNOSIS — E119 Type 2 diabetes mellitus without complications: Secondary | ICD-10-CM | POA: Insufficient documentation

## 2016-07-16 NOTE — Telephone Encounter (Signed)
Tried to contact pt to discuss below and the phone only rang and then stopped without an option of leaving a message.  Will mail letter to pt.Katharina Caper, Salah Nakamura D, [name]

## 2016-07-22 ENCOUNTER — Ambulatory Visit (INDEPENDENT_AMBULATORY_CARE_PROVIDER_SITE_OTHER): Payer: Medicaid Other | Admitting: Pharmacist

## 2016-07-22 ENCOUNTER — Encounter: Payer: Self-pay | Admitting: Pharmacist

## 2016-07-22 ENCOUNTER — Ambulatory Visit (INDEPENDENT_AMBULATORY_CARE_PROVIDER_SITE_OTHER): Payer: Medicaid Other | Admitting: Family Medicine

## 2016-07-22 VITALS — BP 181/109 | HR 84 | Temp 98.6°F | Wt 160.4 lb

## 2016-07-22 DIAGNOSIS — F17200 Nicotine dependence, unspecified, uncomplicated: Secondary | ICD-10-CM

## 2016-07-22 DIAGNOSIS — M79671 Pain in right foot: Secondary | ICD-10-CM | POA: Diagnosis present

## 2016-07-22 DIAGNOSIS — J42 Unspecified chronic bronchitis: Secondary | ICD-10-CM

## 2016-07-22 DIAGNOSIS — J984 Other disorders of lung: Secondary | ICD-10-CM

## 2016-07-22 DIAGNOSIS — J3089 Other allergic rhinitis: Secondary | ICD-10-CM

## 2016-07-22 DIAGNOSIS — I10 Essential (primary) hypertension: Secondary | ICD-10-CM | POA: Diagnosis not present

## 2016-07-22 MED ORDER — HYDROCODONE-ACETAMINOPHEN 5-325 MG PO TABS: 1.0000 | ORAL_TABLET | Freq: Four times a day (QID) | ORAL | 0 refills | Status: DC | PRN

## 2016-07-22 MED ORDER — HYDROCHLOROTHIAZIDE 25 MG PO TABS: 25.0000 mg | ORAL_TABLET | Freq: Every day | ORAL | 11 refills | Status: DC

## 2016-07-22 MED ORDER — CETIRIZINE HCL 10 MG PO TABS: 10.0000 mg | ORAL_TABLET | Freq: Every day | ORAL | 11 refills | Status: DC

## 2016-07-22 MED ORDER — MOMETASONE FURO-FORMOTEROL FUM 200-5 MCG/ACT IN AERO: 2.0000 | INHALATION_SPRAY | Freq: Two times a day (BID) | RESPIRATORY_TRACT | 11 refills | Status: DC

## 2016-07-22 MED ORDER — TIOTROPIUM BROMIDE MONOHYDRATE 1.25 MCG/ACT IN AERS: 2.0000 | INHALATION_SPRAY | Freq: Every day | RESPIRATORY_TRACT | 11 refills | Status: DC

## 2016-07-22 MED ORDER — AMLODIPINE BESYLATE 10 MG PO TABS: 10.0000 mg | ORAL_TABLET | Freq: Every day | ORAL | 3 refills | Status: DC

## 2016-07-22 NOTE — Patient Instructions (Addendum)
Thank you for coming to see [place] today.   STOP taking Nyquil, Dayquil, and the combination allergy product you brought in. These may be making your blood pressure too high.  CONTINUE taking hydroxyzine (itching medication) as needed for itching.   START taking 1 tablet of hydrochlorothiazide (25 mg) once daily  START taking zyrtec (generic name is cetirizine) 10 mg once daily.   START taking Spiriva (generic name is tiotropium) 1 puff once a day.   CONTINUE taking dulera 2 puffs twice a day  CONTINUE taking albuterol inhaler 2 puffs every 4-5 hours as needed for shortness of breath  Keep working on moving apartments. The mold is likely making your breathing worse.

## 2016-07-22 NOTE — Assessment & Plan Note (Signed)
Uncontrolled HTN on amlodipine 5 mg daily, worsened by concomitant decongestants and pt's self-D/C of HCTZ. Restart HCTZ 25 mg daily. Increase amlodipine to 10 mg daily. Avoid all OTC cough/cold/allergy products.

## 2016-07-22 NOTE — Patient Instructions (Signed)
Thank you so much for coming to visit today! I have ordered xrays of your right foot. Please go to [facility] to have these done. I have given you pain medication. Please wear your postOp shoe to help; if you cannot find this, you may also use a boot with a very firm sole.  Your blood pressure is elevated. Please adjust your medications as instructed by Dr. Valentina Lucks. Please return in 2 weeks to see me and in one month to see Dr. Valentina Lucks.  Dr. Gerlean Ren
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
## 2016-07-22 NOTE — Assessment & Plan Note (Addendum)
Spirometry evaluation reveals Moderate restrictive lung disease. Post nebulized albuterol tx revealed no significant improvement in FEV1 or FVC.  Per pt report, she felt the nebulized albuterol improved her breathing. Patient has been experiencing SOB upon exertion and allergic rhinitis for several months and taking Dulera 200-5 2 puffs BID. Continue Dulera, restart albuterol PRN, start Spiriva respimat 1 puff daily, start cetirizine 10 mg daily, stop hydroxyzine, stop OTC cough/cold/allergy products with decongestants.  Samples of Dulera given. Educated patient on purpose, proper use of Dulera and Spiriva Respimat Reviewed results of pulmonary function tests.  Pt verbalized understanding of results and education. Pt currently living in an apartment with significant mold, confirmed by home heath aid. Refer pt to CSW for assistance with living situation. Multiple med changes this visit. Hydroxyzine 25mg , expired HCTZ 25 mg, and amlodipine 5 mg were disposed of at pt's request to simplify new medication regimen and mitigate risk of confusion. New prescriptions of HCTZ 25 mg and amlodipine 10 mg were sent to patient's preferred pharmacy.   Written pt instructions provided.

## 2016-07-23 ENCOUNTER — Telehealth: Payer: Self-pay | Admitting: Licensed Clinical Social Worker

## 2016-07-23 DIAGNOSIS — J984 Other disorders of lung: Secondary | ICD-10-CM | POA: Insufficient documentation

## 2016-07-23 NOTE — Assessment & Plan Note (Signed)
-   Restart Albuterol. Continue Dulera. Initiate Spiriva. - Reports symptoms resolved completely by end of visit. Lung exam normal. - Continue to monitor.

## 2016-07-23 NOTE — Progress Notes (Signed)
Social work consult from Dr. Valentina Lucks to assist patient with resources due to living conditions.   Call to patient to assess and obtain more information about her needs. Left two voice message to call CSW.   Plan: will follow up with patient later this week.  Casimer Lanius, LCSW Licensed Clinical Social Worker [facility]   [phone] 3:44 PM

## 2016-07-23 NOTE — Progress Notes (Signed)
Patient ID: Rhonda Dominguez, female   DOB: 06/15/1961, 55 y.o.   MRN: [id number] Reviewed: Agree with Dr. Graylin Shiver documentation and management.

## 2016-07-23 NOTE — Progress Notes (Signed)
Subjective:     Patient ID: Rhonda Dominguez, female   DOB: May 09, 1961, 55 y.o.   MRN: [id number]  HPI Rhonda Dominguez presents with multiple complaints.  # Right Foot Pain: - Dropped an iron bed rail on her right foot yesterday - Significant pain or dorsal and plantar surfaces of foot - Notes bruising of foot - Unable to move the first digiti of her right foot - Reports she has a postOP shoe at home  # Shortness of Breath: - Has not been using albuterol - Dulera not helping - PFTs today with [place] - On return from [place] visit with nebulizer, reports symptoms are completely resolved. - Social Work consulted by [employer] given presence of mold in apartment. Will see what resources are available to expedite move. - Reports mold in her apartment. Has been given letters to aid with moving to another apartment to no avail. Will move in approximately two weeks.  # Hypertension: - Has not taken blood pressure medication today - Has been taking Amlodipine 5mg  daily. No longer taking HCTZ. - Nurse comes by to prepare medications - Denies shortness of breath, headache, chest pain, blurred vision  - Quit Smoking two weeks ago.  Review of Systems Per HPI. Other systems negative.    Objective:   Physical Exam  Constitutional: She appears well-developed and well-nourished. No distress.  Cardiovascular: Normal rate and regular rhythm.   No murmur heard. - Pedal pulses palpable  Pulmonary/Chest: Effort normal. No respiratory distress. She has no wheezes.  Musculoskeletal:  Bruising of right foot noted. Tenderness diffusely over foot on both plantar and dorsal surfaces. Unable to move first digit; passive movement limited by pain. Ambulating with cane.  Neurological:  - Sensation of right foot intact  Skin:  - Skin Warm, Bruising of Right foot noted.  Psychiatric: She has a normal mood and affect. Her behavior is normal.       Assessment and Plan:     Restrictive lung disease - Restart  Albuterol. Continue Dulera. Initiate Spiriva. - Reports symptoms resolved completely by end of visit. Lung exam normal. - Continue to monitor.  Essential hypertension - Elevated to  181/109 - Has not taken blood pressure medications today. Not taking HCTZ. - Increase Amlodipine to 10mg  daily - Restart HCTZ 25mg  daily - Return in two weeks to recheck blood pressure. Return precautions given.  Right foot pain - Will obtain xrays of right foot given concern for fracture.  - Compartment syndrome considered, however less likely given exam. - Norco - PostOP shoe recommended

## 2016-07-23 NOTE — Assessment & Plan Note (Addendum)
-   Elevated to  181/109 - Has not taken blood pressure medications today. Not taking HCTZ. - Increase Amlodipine to 10mg  daily - Restart HCTZ 25mg  daily - Return in two weeks to recheck blood pressure. Return precautions given.

## 2016-07-24 ENCOUNTER — Telehealth: Payer: Self-pay | Admitting: Licensed Clinical Social Worker

## 2016-07-24 NOTE — Progress Notes (Addendum)
Return call from patient, obtained information to verify patient's identity.  Patient is overwhelmed with her living condition and states it is causing health problems for her. The following was discussed: concerns with section 8 housing, barriers to moving, assistance from [employer], assistance from [place], medication and therapy from [place], and [employer] , patient  shared who she has spoken to at various agencies in order to obtain assistance with her housing concerns and the results.    CSW provided patient with phone number to [facility] as she was unable to locate it and needed to schedule a follow up appointment.  Also provided phone number to [employer]. Patient was very appreciative of information provided.  Plan: Patient is in agreement to follow-up with [facility] and call [employer] to set up an intake appointment to see if they are able to assist her with getting out of her lease and locating new housing. Patient will call CSW back if additional information or resources are needed.    Casimer Lanius, LCSW Licensed Clinical Social Worker [facility]   [phone] 11:56 AM

## 2016-07-29 ENCOUNTER — Telehealth: Payer: Self-pay | Admitting: *Deleted

## 2016-07-29 MED ORDER — TIOTROPIUM BROMIDE MONOHYDRATE 18 MCG IN CAPS: 18.0000 ug | ORAL_CAPSULE | Freq: Every day | RESPIRATORY_TRACT | 12 refills | Status: DC

## 2016-07-29 NOTE — Telephone Encounter (Signed)
Prior Authorization received from [employer] for Spiriva Respimat 1.25 mcg. Preferred formulary is Spirivia Handihaler.  Order given by Dr. Valentina Lucks to change to Spiriva Handihaler 18 mcg daily.  Derl Barrow, RN

## 2016-07-31 ENCOUNTER — Ambulatory Visit (HOSPITAL_COMMUNITY)
Admission: RE | Admit: 2016-07-31 | Discharge: 2016-07-31 | Disposition: A | Discharge status: Home / Self Care | Payer: Medicaid Other | Source: Ambulatory Visit | Attending: Family Medicine | Admitting: Family Medicine

## 2016-07-31 DIAGNOSIS — M79671 Pain in right foot: Secondary | ICD-10-CM | POA: Diagnosis not present

## 2016-08-02 ENCOUNTER — Encounter: Payer: Self-pay | Admitting: Family Medicine

## 2016-08-02 ENCOUNTER — Ambulatory Visit (INDEPENDENT_AMBULATORY_CARE_PROVIDER_SITE_OTHER): Payer: Medicaid Other | Admitting: Family Medicine

## 2016-08-02 DIAGNOSIS — G9050 Complex regional pain syndrome I, unspecified: Secondary | ICD-10-CM

## 2016-08-02 DIAGNOSIS — IMO0002 Reserved for concepts with insufficient information to code with codable children: Secondary | ICD-10-CM

## 2016-08-02 MED ORDER — GABAPENTIN 400 MG PO CAPS: 400.0000 mg | ORAL_CAPSULE | Freq: Three times a day (TID) | ORAL | 3 refills | Status: DC

## 2016-08-02 NOTE — Assessment & Plan Note (Signed)
-  Suspected over right lower extremity given hyperesthesia and chronic right lower extremity pain. -Referral to pain management on 06/14/2016. Still waiting to hear from them. -Gabapentin initiated. -Return in 4 weeks. Consider titrating gabapentin up at that time.

## 2016-08-02 NOTE — Patient Instructions (Addendum)
Thank you so much for coming to visit today! I'm glad your blood pressure is doing so much better! I have talked to Dr. Andria Frames and we believe your pain is due to complex regional pain syndrome. I have included information about this below. We will start you on a medication called gabapentin. Please take one tablet twice a day for next 2 days and then increase to 1 tablet 3 times a day. Please return in one month so we can evaluate how you're doing on this medication.  Complex Regional Pain Syndrome Complex regional pain syndrome (CRPS) is a nerve disorder that causes long-lasting (chronic) pain, usually in a hand, arm, leg, or foot. CRPS usually follows an injury or trauma, such as a fracture or sprain. There are two types of CRPS:  Type 1. This type occurs after an injury or trauma with no known damage to a nerve.  Type 2. This type occurs after injury or trauma damages a nerve. There are three stages of the condition:  Stage 1. This stage, called the acute stage, may last for three months.  Stage 2. This stage, called the dystrophic stage, may last for three to 12 months.  Stage 3. This stage, called the atrophic stage, may start after one year. CRPS ranges from mild to severe. For most people CRPS is mild and recovery happens over time. For others, CRPS lasts a very long time and is debilitating. CAUSES The exact cause of CRPS is not known. RISK FACTORS You may be at increased risk if:  You are a woman.  You are approximately 55 years of age.  You have any of the following:  A family history of CRPS.  An injury or surgery.  An infection.  Cancer.  Neck problems.  A stroke.  A heart attack.  Asthma. SIGNS AND SYMPTOMS Signs and symptoms in the affected limb are different for each stage. Signs and symptoms of stage 1 include:  Burning pain.  A pins and needles sensation.  Extremely sensitive skin.  Swelling.  Joint stiffness.  Warmth and  redness.  Excessive sweating.  Hair and nail growth that is faster than normal. Signs and symptoms of stage 2 include:  Spreading of pain to the whole limb.  Increased skin sensitivity.  Increased swelling and stiffness.  Coolness of the skin.  Blue discoloration of skin.  Loss of skin wrinkles.  Brittle fingernails. Signs and symptoms of stage 3 include:   Pain that spreads to other areas of the body but becomes less severe.  More stiffness, leading to loss of motion.  Skin that is pale, dry, shiny, and tightly stretched. DIAGNOSIS There is no test to diagnose CRPS. Your health care provider will make a diagnosis based on your signs and symptoms and a physical exam. The exam may include tests to rule out other possible causes of your symptoms. Sometimes imaging tests are done, such as an MRI or bone scan. These tests check for bone changes that might indicate CRPS.  TREATMENT Early treatment may prevent CRPS from advancing past stage 1. There is no one treatment that works for everyone. Treatment options may include:  Medicines, such as:  Nonsteroidal-anti-inflammatory drugs (NSAIDS).  Steroids.  Blood pressure drugs.  Antidepressants.  Anti-seizure drugs.  Pain relievers.  Exercise.  Occupational and physical therapy.  Biofeedback.  Mental health counseling.  Numbing injections.  Spinal surgery to implant a spinal cord stimulator or a pain pump. HOME CARE INSTRUCTIONS  Take medicines only as directed by your  health care provider.  Follow an exercise program as directed by your health care provider.  Maintain a healthy weight.  Keep all follow-up visits as directed by your health care provider. This is important. SEEK MEDICAL CARE IF:  Your symptoms change.  Your symptoms get worse.  You develop anxiety or depression.   This information is not intended to replace advice given to you by your health care provider. Make sure you discuss any  questions you have with your health care provider.   Document Released: 11/15/2002 Document Revised: 12/16/2014 Document Reviewed: 08/22/2014 Elsevier Interactive Patient Education [employer].

## 2016-08-02 NOTE — Progress Notes (Signed)
Subjective:     Patient ID: Rhonda Dominguez, female   DOB: 02/01/1961, 55 y.o.   MRN: [id number]  HPI [name] is a 55 year old female presenting today for right foot pain. -Has history of chronic right foot pain. Followed by podiatry. Acute worsening of right foot pain noted on 07/22/2016 after dropping an iron railing on it. X-rays were obtained and were negative for fracture. -Continue to note significant pain in right foot -Describes pain as burning -Also notes pain in right thigh. States this first started with lipoma removal. Cannot tell if pain starts in foot and goes up to lipoma site or vice versa. -Pain affects ambulation. Uses cane to ambulate. -Has been unable to wear shoe on that foot due to the significance of the pain. -Also of note she has moved out of her apartment with the mold. She reports she is staying with her nurse. -Has quit smoking. Continues to wear nicotine patches.  Review of Systems Per HPI. Other systems negative.    Objective:   Physical Exam  Constitutional: She appears well-developed and well-nourished. No distress.  Musculoskeletal:  Significant tenderness diffusely over right foot. Ambulating with cane.  Neurological:  Significant hyperesthesia of the right foot and right thigh  Skin:  No erythema or warmth noted      Assessment and Plan:     Complex regional pain syndrome -Suspected over right lower extremity given hyperesthesia and chronic right lower extremity pain. -Referral to pain management on 06/14/2016. Still waiting to hear from them. -Gabapentin initiated. -Return in 4 weeks. Consider titrating gabapentin up at that time.

## 2016-08-22 ENCOUNTER — Ambulatory Visit: Payer: Medicaid Other | Admitting: Pharmacist

## 2016-08-28 ENCOUNTER — Encounter: Payer: Self-pay | Admitting: Family Medicine

## 2016-08-28 ENCOUNTER — Ambulatory Visit (INDEPENDENT_AMBULATORY_CARE_PROVIDER_SITE_OTHER): Payer: Medicaid Other | Admitting: Family Medicine

## 2016-08-28 VITALS — BP 137/92 | HR 94 | Temp 99.0°F | Ht 66.0 in | Wt 157.6 lb

## 2016-08-28 DIAGNOSIS — G9050 Complex regional pain syndrome I, unspecified: Secondary | ICD-10-CM

## 2016-08-28 DIAGNOSIS — I10 Essential (primary) hypertension: Secondary | ICD-10-CM

## 2016-08-28 DIAGNOSIS — IMO0002 Reserved for concepts with insufficient information to code with codable children: Secondary | ICD-10-CM

## 2016-08-28 DIAGNOSIS — F17200 Nicotine dependence, unspecified, uncomplicated: Secondary | ICD-10-CM

## 2016-08-28 DIAGNOSIS — J984 Other disorders of lung: Secondary | ICD-10-CM

## 2016-08-28 MED ORDER — NORTRIPTYLINE HCL 25 MG PO CAPS: 25.0000 mg | ORAL_CAPSULE | Freq: Every day | ORAL | 6 refills | Status: DC

## 2016-08-28 MED ORDER — QUETIAPINE FUMARATE 50 MG PO TABS: 50.0000 mg | ORAL_TABLET | Freq: Every evening | ORAL | Status: DC | PRN

## 2016-08-28 NOTE — Patient Instructions (Signed)
Thank you so much for coming to visit today! I'm sorry your leg is not feeling better! I have discontinued Gabapentin and initiated Nortriptyline. This is the starting dose of Nortriptyline and this can be titrated up at future visits. I have also placed another referral to the [facility].  I have written a letter to help you move into another facility. Continue your current inhalers. Your blood pressure is much better. Please follow up in 6 weeks.  Dr. Gerlean Ren  Complex Regional Pain Syndrome Complex regional pain syndrome (CRPS) is a nerve disorder that causes long-lasting (chronic) pain, usually in a hand, arm, leg, or foot. CRPS usually follows an injury or trauma, such as a fracture or sprain. There are two types of CRPS:  Type 1. This type occurs after an injury or trauma with no known damage to a nerve.  Type 2. This type occurs after injury or trauma damages a nerve. There are three stages of the condition:  Stage 1. This stage, called the acute stage, may last for three months.  Stage 2. This stage, called the dystrophic stage, may last for three to 12 months.  Stage 3. This stage, called the atrophic stage, may start after one year. CRPS ranges from mild to severe. For most people CRPS is mild and recovery happens over time. For others, CRPS lasts a very long time and is debilitating. CAUSES The exact cause of CRPS is not known. RISK FACTORS You may be at increased risk if:  You are a woman.  You are approximately 55 years of age.  You have any of the following:  A family history of CRPS.  An injury or surgery.  An infection.  Cancer.  Neck problems.  A stroke.  A heart attack.  Asthma. SIGNS AND SYMPTOMS Signs and symptoms in the affected limb are different for each stage. Signs and symptoms of stage 1 include:  Burning pain.  A pins and needles sensation.  Extremely sensitive skin.  Swelling.  Joint stiffness.  Warmth and  redness.  Excessive sweating.  Hair and nail growth that is faster than normal. Signs and symptoms of stage 2 include:  Spreading of pain to the whole limb.  Increased skin sensitivity.  Increased swelling and stiffness.  Coolness of the skin.  Blue discoloration of skin.  Loss of skin wrinkles.  Brittle fingernails. Signs and symptoms of stage 3 include:   Pain that spreads to other areas of the body but becomes less severe.  More stiffness, leading to loss of motion.  Skin that is pale, dry, shiny, and tightly stretched. DIAGNOSIS There is no test to diagnose CRPS. Your health care provider will make a diagnosis based on your signs and symptoms and a physical exam. The exam may include tests to rule out other possible causes of your symptoms. Sometimes imaging tests are done, such as an MRI or bone scan. These tests check for bone changes that might indicate CRPS.  TREATMENT Early treatment may prevent CRPS from advancing past stage 1. There is no one treatment that works for everyone. Treatment options may include:  Medicines, such as:  Nonsteroidal-anti-inflammatory drugs (NSAIDS).  Steroids.  Blood pressure drugs.  Antidepressants.  Anti-seizure drugs.  Pain relievers.  Exercise.  Occupational and physical therapy.  Biofeedback.  Mental health counseling.  Numbing injections.  Spinal surgery to implant a spinal cord stimulator or a pain pump. HOME CARE INSTRUCTIONS  Take medicines only as directed by your health care provider.  Follow an  exercise program as directed by your health care provider.  Maintain a healthy weight.  Keep all follow-up visits as directed by your health care provider. This is important. SEEK MEDICAL CARE IF:  Your symptoms change.  Your symptoms get worse.  You develop anxiety or depression.   This information is not intended to replace advice given to you by your health care provider. Make sure you discuss any  questions you have with your health care provider.   Document Released: 11/15/2002 Document Revised: 12/16/2014 Document Reviewed: 08/22/2014 Elsevier Interactive Patient Education [employer].

## 2016-08-28 NOTE — Progress Notes (Signed)
Subjective:     Patient ID: Rhonda Dominguez, female   DOB: [birth year], 55 y.o.   MRN: [id number]  HPI Rhonda Dominguez is a 55 year old female presenting today for right leg pain and difficulty breathing.  # Right Leg Pain. -Last office visit 08/02/2016. She was diagnosed with complex regional pain syndrome of the right leg at this time. Gabapentin initiated at 400mg  three times daily with plan to titrate up. -Referral to pain management made on 06/14/2016. Referral was denied. - Pain 10/10 in severity. Describes as sharp, stinging, burning. - Reports no improvement in pain - Denies any relief from Gabapentin. Refuses to continue taking or increase dose. - States she can't go out of her house due to the pain. It has prevented her from going to church. - Usually walks with cane  #Restrictive Lung Disease. -Spirometry from 07/22/2016 showed moderate restrictive lung disease. Ruthe Mannan was continued albuterol was refilled to use when necessary. Spiriva initiated. - Denies any shortness of breath currently.  - Has moved back into apartment, where Rhonda Dominguez has previously reported mold. Also reports she thinks her air condition filters are old. Notes shortness of breath while in her apartment which resolves when she leaves the apartment. Worse with air conditioning on. - Requests another letter today for her to move out of apartments. Multiple letters written in past.  - Reports inhalers given at last office visit are working well   Recently quit smoking. Currently wearing Nicotine patch.  Review of Systems Per HPI.     Objective:   Physical Exam  Constitutional: She appears well-developed and well-nourished. No distress.  HENT:  Head: Normocephalic and atraumatic.  Mouth/Throat: Oropharynx is clear and moist.  Cardiovascular: Normal rate and regular rhythm.   No murmur heard. Pulmonary/Chest: Effort normal. No respiratory distress. She has no wheezes.  Musculoskeletal:  Muscle strength  5/5 in upper and lower extremities. Ambulates with cane  Neurological:  Hyperesthesia of right leg (thigh to foot) noted  Skin: No rash noted.  Psychiatric: She has a normal mood and affect. Her behavior is normal.      Assessment and Plan:     Complex regional pain syndrome - Unimproved - Refuses Gabapentin. Will transition to Nortriptyline, consider titrating up at next office visit. - Previous referral to Pain Medicine denied, however this was prior to diagnosis of Complex Regional Pain Syndrome. Will again attempt referral. - Follow up in 6 weeks  Restrictive lung disease - Normal lung exam today. Reports symptoms only occur in her current apartment. - Letter once again written to allow for move out of apartment - Encouraged to change air filter  Essential hypertension - Improved - Continue current antihypertensives.  TOBACCO USER - Encouraged to continue smoking cessation. Wearing nicotine patch

## 2016-09-01 NOTE — Assessment & Plan Note (Signed)
-   Normal lung exam today. Reports symptoms only occur in her current apartment. - Letter once again written to allow for move out of apartment - Encouraged to change air filter

## 2016-09-01 NOTE — Assessment & Plan Note (Addendum)
-   Unimproved - Refuses Gabapentin. Will transition to Nortriptyline, consider titrating up at next office visit. - Previous referral to Pain Medicine denied, however this was prior to diagnosis of Complex Regional Pain Syndrome. Will again attempt referral. - Follow up in 6 weeks

## 2016-09-01 NOTE — Assessment & Plan Note (Signed)
-   Improved - Continue current antihypertensives.

## 2016-09-01 NOTE — Assessment & Plan Note (Signed)
-   Encouraged to continue smoking cessation. Wearing nicotine patch

## 2016-09-02 ENCOUNTER — Other Ambulatory Visit: Payer: Self-pay | Admitting: Family Medicine

## 2016-09-02 ENCOUNTER — Ambulatory Visit: Payer: Medicaid Other | Admitting: Pharmacist

## 2016-09-11 ENCOUNTER — Telehealth: Payer: Self-pay | Admitting: *Deleted

## 2016-09-11 ENCOUNTER — Other Ambulatory Visit: Payer: Self-pay | Admitting: Family Medicine

## 2016-09-11 DIAGNOSIS — J42 Unspecified chronic bronchitis: Secondary | ICD-10-CM

## 2016-09-11 NOTE — Telephone Encounter (Signed)
Patient has an appointment with [facility] 11-3/17at 3 PM.  Pt need to bring insurance card, Picture ID, medical records if any and co-pay.  They will mail patient an information packet to complete before appointment.  Derl Barrow, RN

## 2016-10-03 ENCOUNTER — Encounter: Payer: Self-pay | Admitting: Family Medicine

## 2016-10-03 ENCOUNTER — Ambulatory Visit (INDEPENDENT_AMBULATORY_CARE_PROVIDER_SITE_OTHER): Payer: Medicaid Other | Admitting: Family Medicine

## 2016-10-03 VITALS — BP 110/77 | HR 99 | Temp 98.6°F | Ht 66.0 in | Wt 162.8 lb

## 2016-10-03 DIAGNOSIS — R351 Nocturia: Secondary | ICD-10-CM | POA: Diagnosis not present

## 2016-10-03 DIAGNOSIS — G90521 Complex regional pain syndrome I of right lower limb: Secondary | ICD-10-CM

## 2016-10-03 LAB — BASIC METABOLIC PANEL WITH GFR
BUN: 22 mg/dL (ref 7–25)
CALCIUM: 10.7 mg/dL — AB (ref 8.6–10.4)
CO2: 23 mmol/L (ref 20–31)
Chloride: 109 mmol/L (ref 98–110)
Creat: 1.27 mg/dL — ABNORMAL HIGH (ref 0.50–1.05)
GFR, EST AFRICAN AMERICAN: 55 mL/min — AB (ref >=60)
GFR, EST NON AFRICAN AMERICAN: 47 mL/min — AB (ref >=60)
GLUCOSE: 98 mg/dL (ref 65–99)
POTASSIUM: 3.8 mmol/L (ref 3.5–5.3)
SODIUM: 141 mmol/L (ref 135–146)

## 2016-10-03 LAB — POCT URINALYSIS DIPSTICK
BILIRUBIN UA: NEGATIVE
GLUCOSE UA: NEGATIVE
LEUKOCYTES UA: NEGATIVE
NITRITE UA: NEGATIVE
RBC UA: NEGATIVE
Spec Grav, UA: >=1.03
UROBILINOGEN UA: 1
pH, UA: 5.5

## 2016-10-03 NOTE — Patient Instructions (Signed)
Thank you so much for coming to visit today! - We will check your urine and blood today. I will talk to [place] based on the results. - Please return next week to complete the forms and do more testing.  Dr. Gerlean Ren

## 2016-10-06 NOTE — Progress Notes (Signed)
Subjective:     Patient ID: Rhonda Dominguez, female   DOB: 23-Oct-1961, 55 y.o.   MRN: [id number]  HPI Mrs. Lukasik is a 55yo female presenting today for [facility]. - Goal: Continue to work on pain management - Lives in her apartment. Wishes to move by end of November given previously reported problems (mold, stairs, air conditioning). Has Nurse that comes in to help with ADLs 3hours per day. - Length of visit limited. Acknowledges that an hours was set aside for this visit, but reports she only has 32minutes before she needs to leave. - Wished to address acute concerns:  Pain improved but still present in right foot. Ambulating with cane. Requests walker, but then decided she did not need one. Unwilling to adjust medication at this time.  Reports worsening problems with incontinence. Reports some dysuria. Denies fever or abdominal pain. Request diapers and incontinence pads.  Review of Systems Per HPI    Objective:   Physical Exam  Constitutional: She appears well-developed and well-nourished. No distress.  Pulmonary/Chest: Effort normal. No respiratory distress.  Musculoskeletal:  Mild tenderness of right foot, but hyperesthesia present at last office visit resolved. Cane present, but seen to be standing, walking, squatting, without apparent pain.  Psychiatric:  Pressured      Assessment and Plan:     1. Nocturia/Enueresis - Will obtain Urinalysis, BMP - Will consider diapers/incontinence pads pending lab results - Follow up at next office visit on 11/1.  2. Complex Regional Pain Syndrome - Continue Amitriptyline. Refusing adjustment of medication. - Referred to Pain Management. Paperwork to be filled out at next office visit.

## 2016-10-09 ENCOUNTER — Ambulatory Visit: Payer: Medicaid Other | Admitting: Family Medicine

## 2016-10-10 ENCOUNTER — Telehealth: Payer: Self-pay | Admitting: Family Medicine

## 2016-10-10 NOTE — Telephone Encounter (Signed)
Pt states she needs Dr. Gerlean Ren or Dr. Johnathan Hausen nurse to call her today regarding her pain clinic appointment tomorrow. Pt states she needs to know what to say because it has to match what Dr. Gerlean Ren says. Pt would also like someone to call regarding labs. Please advise. ep

## 2016-10-10 NOTE — Telephone Encounter (Signed)
Pt called again. Pt said its ok to leave a detailed vm. Please advise. Thanks! ep

## 2016-10-10 NOTE — Telephone Encounter (Signed)
Routing to PCP so she can contact pt and discuss this. Katharina Caper, Rhonda Dominguez, [name]

## 2016-10-11 NOTE — Telephone Encounter (Signed)
Patient called again asking to speak with Dr. Gerlean Ren. Information needed for pain clinic appt given. Patient would like to know lab results. Please advise.  Hubbard Hartshorn, RN, BSN

## 2016-10-11 NOTE — Telephone Encounter (Signed)
Attempted to contact without response. Will continue to attempt contact.

## 2016-10-20 ENCOUNTER — Other Ambulatory Visit: Payer: Self-pay | Admitting: Family Medicine

## 2016-11-18 ENCOUNTER — Ambulatory Visit (INDEPENDENT_AMBULATORY_CARE_PROVIDER_SITE_OTHER): Payer: Medicaid Other | Admitting: Family Medicine

## 2016-11-18 ENCOUNTER — Encounter: Payer: Self-pay | Admitting: Family Medicine

## 2016-11-18 VITALS — BP 136/90 | HR 78 | Temp 97.9°F | Ht 66.0 in | Wt 164.0 lb

## 2016-11-18 DIAGNOSIS — Z23 Encounter for immunization: Secondary | ICD-10-CM | POA: Diagnosis not present

## 2016-11-18 DIAGNOSIS — Z0000 Encounter for general adult medical examination without abnormal findings: Secondary | ICD-10-CM

## 2016-11-18 DIAGNOSIS — R748 Abnormal levels of other serum enzymes: Secondary | ICD-10-CM | POA: Diagnosis not present

## 2016-11-18 DIAGNOSIS — Z7253 High risk bisexual behavior: Secondary | ICD-10-CM | POA: Diagnosis present

## 2016-11-18 NOTE — Assessment & Plan Note (Signed)
-   Repeat HIV and RPR requested at last office visit. Does not wish to have wet prep or GC/Chlamydia screened. - Recheck BMP - Medications reviewed and updated in epic.  - Follow up with Pharmacy as scheduled. To bring inhalers to this visit. - Follow up PRN

## 2016-11-18 NOTE — Patient Instructions (Signed)
Thank you so much for coming to visit today! Please follow up with Dr. Valentina Lucks and bring all of your medications. He will be able to help you with your inhaler pumps. We will check your kidney function today. If it is still elevated, I will refer you to Nephrology. I will contact [facility] concerning incontinence pads.  I'm glad you have gotten to see the pain doctor! Hopefully the injection helps your pain!  Please follow up as needed.  Dr. Gerlean Ren

## 2016-11-18 NOTE — Progress Notes (Signed)
Subjective:     Patient ID: Rhonda Dominguez, female   DOB: 08-30-1961, 55 y.o.   MRN: [id number]  HPI Rhonda Dominguez is a 55yo female presenting today for medication review. Wished for medications to be updated in Epic. Brought most medications to visit today. Medications brought include Bupropion, Benztropine, Quetiapine, ProAir, Amlodipine, Gabapentin, Fluocinolone, Triamcinolone, Cromolyn, and Lamotrigine. Did not bring but also reports taking HCTZ, Tiotropium, Nortriptyline, Dulera, and Depakote. Meant to bring her inhalers to today's visit for additional teaching but forgot. Reports she has a visit scheduled with [facility] and she will bring her inhalers to that visit. Also of note, reports she has been to pain management and they are planning a procedure, although she does not remember what procedure. Also requests prescription for incontinence pads through [facility]. Former Smoker. Using Nicotine Patch.  Review of Systems Per HPI    Objective:   Physical Exam  Constitutional: She appears well-developed and well-nourished. No distress.  HENT:  Head: Normocephalic and atraumatic.  Cardiovascular: Normal rate and regular rhythm.   No murmur heard. Pulmonary/Chest: No respiratory distress. She has no wheezes.  Abdominal: She exhibits no distension. There is no tenderness.  Psychiatric: She has a normal mood and affect.  Less pressured than previous visits        Assessment and Plan:     Health care maintenance - Repeat HIV and RPR requested at last office visit. Does not wish to have wet prep or GC/Chlamydia screened. - Recheck BMP - Medications reviewed and updated in epic.  - Follow up with Pharmacy as scheduled. To bring inhalers to this visit. - Follow up PRN

## 2016-11-19 ENCOUNTER — Telehealth: Payer: Self-pay | Admitting: Family Medicine

## 2016-11-19 LAB — BASIC METABOLIC PANEL
BUN: 17 mg/dL (ref 7–25)
CO2: 24 mmol/L (ref 20–31)
CREATININE: 0.84 mg/dL (ref 0.50–1.05)
Calcium: 10.7 mg/dL — ABNORMAL HIGH (ref 8.6–10.4)
Chloride: 107 mmol/L (ref 98–110)
Glucose, Bld: 91 mg/dL (ref 65–99)
Potassium: 3.9 mmol/L (ref 3.5–5.3)
Sodium: 141 mmol/L (ref 135–146)

## 2016-11-19 LAB — RPR

## 2016-11-19 LAB — HIV ANTIBODY (ROUTINE TESTING W REFLEX): HIV: NONREACTIVE

## 2016-11-19 NOTE — Telephone Encounter (Signed)
Pt needs orders faxed over today for pads for the bed, pt needs 2 boxes. [place] [phone] Fax [phone]. Pt would also like for PCP to call her. Please advise. Thanks! ep

## 2016-11-20 ENCOUNTER — Encounter: Payer: Self-pay | Admitting: Family Medicine

## 2016-11-20 DIAGNOSIS — R32 Unspecified urinary incontinence: Secondary | ICD-10-CM | POA: Insufficient documentation

## 2016-11-20 NOTE — Telephone Encounter (Signed)
Pt calls again, she is out of her pads.  Is requesting that this be done today so she can have them by Friday. Kalis Friese, Salome Spotted, CMA

## 2016-11-20 NOTE — Telephone Encounter (Signed)
Pt wanted to let us know [facility] will be sending over another form, this time it is for adult diapers. Please advise. Thanks! ep

## 2016-11-20 NOTE — Telephone Encounter (Signed)
Pt informed that Rx was sent, we will wait to receive the form back from [facility] for MD signature.  Pt is aware that this may take a few hours.  She is reluctant but agreeable. [place], Salome Spotted, CMA

## 2016-11-21 NOTE — Telephone Encounter (Signed)
New form was sent over and signed by Dr. Nori Riis. I faxed form over to [facility]. ep

## 2016-12-12 ENCOUNTER — Ambulatory Visit: Payer: Medicaid Other | Admitting: Pharmacist

## 2017-02-18 ENCOUNTER — Emergency Department (HOSPITAL_COMMUNITY)
Admission: EM | Admit: 2017-02-18 | Discharge: 2017-02-18 | Disposition: A | Discharge status: Home / Self Care | Payer: Medicaid Other | Attending: Emergency Medicine | Admitting: Emergency Medicine

## 2017-02-18 ENCOUNTER — Emergency Department (HOSPITAL_COMMUNITY): Payer: Medicaid Other

## 2017-02-18 ENCOUNTER — Encounter (HOSPITAL_COMMUNITY): Payer: Self-pay

## 2017-02-18 DIAGNOSIS — S62101A Fracture of unspecified carpal bone, right wrist, initial encounter for closed fracture: Secondary | ICD-10-CM

## 2017-02-18 DIAGNOSIS — Z7982 Long term (current) use of aspirin: Secondary | ICD-10-CM | POA: Insufficient documentation

## 2017-02-18 DIAGNOSIS — S62171A Displaced fracture of trapezium [larger multangular], right wrist, initial encounter for closed fracture: Secondary | ICD-10-CM | POA: Diagnosis not present

## 2017-02-18 DIAGNOSIS — S6991XA Unspecified injury of right wrist, hand and finger(s), initial encounter: Secondary | ICD-10-CM | POA: Diagnosis present

## 2017-02-18 DIAGNOSIS — E119 Type 2 diabetes mellitus without complications: Secondary | ICD-10-CM | POA: Diagnosis not present

## 2017-02-18 DIAGNOSIS — Z79899 Other long term (current) drug therapy: Secondary | ICD-10-CM | POA: Insufficient documentation

## 2017-02-18 DIAGNOSIS — F17200 Nicotine dependence, unspecified, uncomplicated: Secondary | ICD-10-CM | POA: Insufficient documentation

## 2017-02-18 DIAGNOSIS — Y92410 Unspecified street and highway as the place of occurrence of the external cause: Secondary | ICD-10-CM | POA: Diagnosis not present

## 2017-02-18 DIAGNOSIS — I10 Essential (primary) hypertension: Secondary | ICD-10-CM | POA: Insufficient documentation

## 2017-02-18 DIAGNOSIS — Y999 Unspecified external cause status: Secondary | ICD-10-CM | POA: Diagnosis not present

## 2017-02-18 DIAGNOSIS — Y9389 Activity, other specified: Secondary | ICD-10-CM | POA: Diagnosis not present
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
## 2017-02-18 NOTE — Progress Notes (Signed)
Orthopedic Tech Progress Note Patient Details:  Rhonda Dominguez 1961-09-20 [id number]  Ortho Devices Type of Ortho Device: Thumb velcro splint Ortho Device/Splint Location: RUE Ortho Device/Splint Interventions: Ordered, Application   Braulio Bosch 02/18/2017, 5:53 PM

## 2017-02-18 NOTE — Discharge Instructions (Signed)
Wear the splint as needed for comfort.  Use ibuprofen 400 mg 3 times a day as needed for pain.

## 2017-02-21 ENCOUNTER — Ambulatory Visit: Payer: Medicaid Other | Admitting: Family Medicine

## 2017-03-06 ENCOUNTER — Encounter: Payer: Self-pay | Admitting: Family Medicine

## 2017-03-06 ENCOUNTER — Ambulatory Visit (INDEPENDENT_AMBULATORY_CARE_PROVIDER_SITE_OTHER): Payer: Self-pay | Admitting: Family Medicine

## 2017-03-06 VITALS — BP 124/82 | HR 96 | Temp 98.0°F | Ht 67.0 in | Wt 165.2 lb

## 2017-03-06 DIAGNOSIS — S62174A Nondisplaced fracture of trapezium [larger multangular], right wrist, initial encounter for closed fracture: Secondary | ICD-10-CM

## 2017-03-06 DIAGNOSIS — M542 Cervicalgia: Secondary | ICD-10-CM

## 2017-03-06 NOTE — Patient Instructions (Signed)
Please follow up with Orthopedics as scheduled. Please go to [facility] for an x[facility] of your neck.  Please follow up with Dr. Valentina Lucks to discuss your inhalers and smoking  Dr. Gerlean Ren
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
## 2017-03-21 ENCOUNTER — Encounter: Payer: Medicaid Other | Admitting: Family Medicine

## 2017-03-24 ENCOUNTER — Ambulatory Visit: Payer: Medicaid Other | Admitting: Pharmacist

## 2017-03-24 ENCOUNTER — Encounter: Payer: Medicaid Other | Admitting: Family Medicine

## 2017-03-28 ENCOUNTER — Encounter: Payer: Self-pay | Admitting: Podiatry

## 2017-03-28 NOTE — Progress Notes (Signed)
Sent requested medical records to [facility] via secure e-mail.

## 2017-04-23 ENCOUNTER — Other Ambulatory Visit: Payer: Self-pay | Admitting: Family Medicine

## 2017-05-06 ENCOUNTER — Encounter: Payer: Self-pay | Admitting: Family Medicine

## 2017-05-06 ENCOUNTER — Ambulatory Visit (INDEPENDENT_AMBULATORY_CARE_PROVIDER_SITE_OTHER): Payer: Medicaid Other | Admitting: Family Medicine

## 2017-05-06 VITALS — BP 144/92 | HR 76 | Temp 98.1°F | Ht 67.0 in | Wt 171.4 lb

## 2017-05-06 DIAGNOSIS — J42 Unspecified chronic bronchitis: Secondary | ICD-10-CM | POA: Diagnosis not present

## 2017-05-06 DIAGNOSIS — M79671 Pain in right foot: Secondary | ICD-10-CM

## 2017-05-06 DIAGNOSIS — I10 Essential (primary) hypertension: Secondary | ICD-10-CM | POA: Diagnosis not present

## 2017-05-06 MED ORDER — HYDROCHLOROTHIAZIDE 25 MG PO TABS: 25.0000 mg | ORAL_TABLET | Freq: Every day | ORAL | 3 refills | Status: DC

## 2017-05-06 MED ORDER — ALBUTEROL SULFATE HFA 108 (90 BASE) MCG/ACT IN AERS: INHALATION_SPRAY | RESPIRATORY_TRACT | 3 refills | Status: DC

## 2017-05-08 NOTE — Progress Notes (Signed)
Subjective:     Patient ID: Rhonda Dominguez, female   DOB: 10-05-1961, 56 y.o.   MRN: [id number]  HPI Rhonda Dominguez is a 56yo presenting today for medication refill and referral to Orthopedics. Reports she is in a hurry and refuses exam. States she needs medications refilled--specifically her hydroxyzine and one of her blood pressure medications. She requests we contact Pharmacy to see which medications she needs.  Also requests referral to Orthopedics for right foot pain. Requests to be referred to Dr. Porfirio Mylar specifically. Has history of right foot surgery. Also states she had a biopsy of her thigh which came back abnormal--plans to bring these records to the office.  [place] and they report refills are needed for HCTZ and Albuterol. States Hydroxyzine is already filled and waiting for patient to pickup--refill not needed.  Stopped smoking 2 weeks ago.  Review of Systems Per HPI    Objective:   Physical Exam  Constitutional: She appears well-developed and well-nourished. No distress.  Pulmonary/Chest: Effort normal. No respiratory distress.  Refuses rest of exam.     Assessment and Plan:     1. Chronic bronchitis, unspecified chronic bronchitis type (HCC) Albuterol refilled.  2. Essential hypertension Continue Amlodipine. Out of HCTZ--refilled.  3. Right foot pain Referral placed to Orthopedics. Refused exam of foot and left after stating she needed refills and referral without opportunity for further history of exam. History of chronic right foot pain with multiple surgeries and lipoma removal from right thigh. Previously seen by Podiatry and Pain Management.

## 2017-05-26 ENCOUNTER — Ambulatory Visit: Payer: Medicaid Other | Admitting: Pharmacist

## 2017-05-28 ENCOUNTER — Ambulatory Visit: Payer: Medicaid Other | Admitting: Family Medicine

## 2017-06-05 ENCOUNTER — Ambulatory Visit (INDEPENDENT_AMBULATORY_CARE_PROVIDER_SITE_OTHER): Payer: Medicaid Other | Admitting: Family Medicine

## 2017-06-05 ENCOUNTER — Encounter: Payer: Self-pay | Admitting: Family Medicine

## 2017-06-05 ENCOUNTER — Ambulatory Visit: Payer: Medicaid Other | Admitting: Pharmacist

## 2017-06-05 VITALS — BP 108/78 | HR 108 | Temp 98.1°F | Ht 67.0 in | Wt 159.0 lb

## 2017-06-05 DIAGNOSIS — M79604 Pain in right leg: Secondary | ICD-10-CM | POA: Diagnosis present

## 2017-06-05 DIAGNOSIS — J984 Other disorders of lung: Secondary | ICD-10-CM

## 2017-06-05 MED ORDER — GABAPENTIN 400 MG PO CAPS: 400.0000 mg | ORAL_CAPSULE | Freq: Three times a day (TID) | ORAL | 3 refills | Status: DC

## 2017-06-05 MED ORDER — NORTRIPTYLINE HCL 25 MG PO CAPS: 25.0000 mg | ORAL_CAPSULE | Freq: Every day | ORAL | 6 refills | Status: DC

## 2017-06-05 MED ORDER — MOMETASONE FURO-FORMOTEROL FUM 200-5 MCG/ACT IN AERO: 2.0000 | INHALATION_SPRAY | Freq: Two times a day (BID) | RESPIRATORY_TRACT | 11 refills | Status: DC

## 2017-06-05 NOTE — Progress Notes (Signed)
Patient on pharmacy schedule for "Breathing Issues."  Discussed breathing with patient.  She is currently using albuterol 4 puffs 2-3 times per day which she states helps her breathing.  She requests a PFT test today.  Patient has been taking Spiriva 18 mcg BID (prescribed daily) and has not been taking Dulera 200/5 mcg.   Discussed case with Dr Gerlean Ren. Pharmacy PFT machine is currently broken.  Will call patient for PFT followup once available.   Dr Gerlean Ren to order [facility] 200 2 puffs BID Instructed patient to take Spiriva 1 puff daily instead of BID.  Bennye Alm, PharmD, [place] PGY2 Pharmacy Resident [phone]

## 2017-06-05 NOTE — Progress Notes (Signed)
Subjective:     Patient ID: Rhonda Dominguez, female   DOB: 01-Nov-1961, 56 y.o.   MRN: [id number]  HPI Rhonda Dominguez is a 56 year old female presenting today for right leg pain and refills of medications. Leg pain previously improved with nortriptyline and gabapentin. Requests refill of these medications. Scheduled to follow-up with orthopedics tomorrow. Also reports plan to discuss evaluation for motorized scooter with orthopedics as well. Seen by pharmacy today. PFTs not able to be obtained due to malfunctioning machine. Pharmacy does note she is out of [place]. Requests Dulera refill. Please see their note. Also of note, has been moved to a handicapped apartment on the first floor. We have been working on changing her housing for some time.  Review of Systems Per HPI    Objective:   Physical Exam  Constitutional: She appears well-developed and well-nourished. No distress.  Cardiovascular: Normal rate and regular rhythm.   No murmur heard. Pulmonary/Chest: Effort normal. No respiratory distress. She has no wheezes.  Psychiatric: She has a normal mood and affect. Her behavior is normal.      Assessment and Plan:     1. Right leg pain Nortriptyline and Gabapentin refilled. Follow up with Orthopedics as scheduled.  2. Restrictive lung disease Seen by Pharmacy--please see their note. To return for PFTs when machine functioning again. Dulera refilled.

## 2017-06-05 NOTE — Patient Instructions (Signed)
Medications refilled. Please follow up with Orthopedics concerning a motorized scooter. It was a pleasure taking care of you for the last several years!!!  Dr. Gerlean Ren

## 2017-06-12 DIAGNOSIS — M79641 Pain in right hand: Secondary | ICD-10-CM | POA: Diagnosis not present

## 2017-06-23 ENCOUNTER — Ambulatory Visit: Payer: Medicaid Other | Admitting: Internal Medicine

## 2017-06-24 ENCOUNTER — Ambulatory Visit: Payer: Medicaid Other | Admitting: Internal Medicine

## 2017-06-26 ENCOUNTER — Ambulatory Visit: Payer: Medicaid Other | Admitting: Pharmacist

## 2017-07-02 ENCOUNTER — Encounter: Payer: Self-pay | Admitting: Internal Medicine

## 2017-07-02 ENCOUNTER — Ambulatory Visit (INDEPENDENT_AMBULATORY_CARE_PROVIDER_SITE_OTHER): Payer: Medicaid Other | Admitting: Internal Medicine

## 2017-07-02 VITALS — BP 110/80 | HR 93 | Temp 98.3°F | Ht 67.0 in | Wt 164.0 lb

## 2017-07-02 DIAGNOSIS — M79604 Pain in right leg: Secondary | ICD-10-CM | POA: Diagnosis not present
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
## 2017-07-02 NOTE — Patient Instructions (Signed)
I have placed the referral to a different orthopedics doctor for a second opinion and have re-ordered physical therapy for your pain.   Take Care,   Dr. Juleen China

## 2017-07-10 ENCOUNTER — Ambulatory Visit (INDEPENDENT_AMBULATORY_CARE_PROVIDER_SITE_OTHER): Payer: Medicaid Other | Admitting: Orthopedic Surgery

## 2017-07-10 ENCOUNTER — Encounter: Payer: Self-pay | Admitting: Pharmacist

## 2017-07-10 ENCOUNTER — Ambulatory Visit (INDEPENDENT_AMBULATORY_CARE_PROVIDER_SITE_OTHER): Payer: Medicaid Other | Admitting: Pharmacist

## 2017-07-10 DIAGNOSIS — F17200 Nicotine dependence, unspecified, uncomplicated: Secondary | ICD-10-CM

## 2017-07-10 DIAGNOSIS — J42 Unspecified chronic bronchitis: Secondary | ICD-10-CM

## 2017-07-10 DIAGNOSIS — J984 Other disorders of lung: Secondary | ICD-10-CM | POA: Diagnosis not present

## 2017-07-10 MED ORDER — NICOTINE 14 MG/24HR TD PT24: 14.0000 mg | MEDICATED_PATCH | Freq: Every day | TRANSDERMAL | 2 refills | Status: DC

## 2017-07-10 MED ORDER — ALBUTEROL SULFATE HFA 108 (90 BASE) MCG/ACT IN AERS: INHALATION_SPRAY | RESPIRATORY_TRACT | 3 refills | Status: DC

## 2017-07-10 MED ORDER — FLUTICASONE-UMECLIDIN-VILANT 100-62.5-25 MCG/INH IN AEPB: 1.0000 | INHALATION_SPRAY | Freq: Every day | RESPIRATORY_TRACT | 0 refills | Status: DC
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
## 2017-07-10 NOTE — Patient Instructions (Addendum)
Thanks for coming to see [place] today.   1. Start taking aspirin 81 mg (baby aspirin) once a day  2. Take your seroquel for sleep every night for the next week  3. Take your new inhaler, Trelegy, one inhalation once a day. Wash your mouth out after using it. Stop taking your spiriva and dulera.   4. Pick up an albuterol inhaler (puffer) at the pharmacy  5. You can make a follow up appointment after you see Dr. Juleen China

## 2017-07-10 NOTE — Assessment & Plan Note (Signed)
Smoking - continues to smoke 1-2 cigarettes/day, willing to restart remaining 21mg  nicotine patches (~4) and then willing to taper to 14mg  in attempt to completely quit smoking.  -Sent nicotine patches 14 mg/24hrs to [place] on importance of tobacco cessation for control of lung disease and overall health

## 2017-07-10 NOTE — Assessment & Plan Note (Signed)
History of restrictive lung disease, poorly controlled per patient report 2/2 medication nonadherence and continued smoking.  -Discontinued dulera and spiriva inhaler -Initiated trelegy ellipta, 1 inhalation daily. Provided patient with 28 day supply -Sent Rx for albuterol to pharmacy - Educated patient on purpose, proper use, potential adverse effects including risk of esophageal candidiasis and need to rinse mouth after each use.  Patient verbalized understanding of results and education.   Smoking - continues to smoke 1-2 cigarettes/day, willing to restart remaining 21mg  nicotine patches (~4) and then willing to taper to 14mg  in attempt to completely quit smoking.  -Sent nicotine patches 14 mg/24hrs to [place] on importance of tobacco cessation for control of lung disease and overall health

## 2017-07-14 NOTE — Progress Notes (Signed)
Patient ID: Rhonda Dominguez, female   DOB: 09/28/1961, 56 y.o.   MRN: [id number] Reviewed: Agree with Dr. Koval's documentation and management. 

## 2017-07-16 ENCOUNTER — Encounter: Payer: Medicaid Other | Admitting: Internal Medicine

## 2017-07-28 ENCOUNTER — Emergency Department (HOSPITAL_COMMUNITY): Payer: Medicaid Other

## 2017-07-28 ENCOUNTER — Emergency Department (HOSPITAL_COMMUNITY)
Admission: EM | Admit: 2017-07-28 | Discharge: 2017-07-28 | Disposition: A | Discharge status: Home / Self Care | Payer: Medicaid Other | Attending: Emergency Medicine | Admitting: Emergency Medicine

## 2017-07-28 ENCOUNTER — Encounter (HOSPITAL_COMMUNITY): Payer: Self-pay | Admitting: Emergency Medicine

## 2017-07-28 DIAGNOSIS — L84 Corns and callosities: Secondary | ICD-10-CM | POA: Insufficient documentation

## 2017-07-28 DIAGNOSIS — F17200 Nicotine dependence, unspecified, uncomplicated: Secondary | ICD-10-CM | POA: Diagnosis not present

## 2017-07-28 DIAGNOSIS — I10 Essential (primary) hypertension: Secondary | ICD-10-CM | POA: Insufficient documentation

## 2017-07-28 DIAGNOSIS — Z7982 Long term (current) use of aspirin: Secondary | ICD-10-CM | POA: Insufficient documentation

## 2017-07-28 DIAGNOSIS — E119 Type 2 diabetes mellitus without complications: Secondary | ICD-10-CM | POA: Diagnosis not present

## 2017-07-28 DIAGNOSIS — Z79899 Other long term (current) drug therapy: Secondary | ICD-10-CM | POA: Insufficient documentation

## 2017-07-28 DIAGNOSIS — J410 Simple chronic bronchitis: Secondary | ICD-10-CM | POA: Diagnosis not present

## 2017-07-28 DIAGNOSIS — M79671 Pain in right foot: Secondary | ICD-10-CM | POA: Diagnosis present

## 2017-07-28 MED ORDER — ALBUTEROL SULFATE (2.5 MG/3ML) 0.083% IN NEBU
5.0000 mg | INHALATION_SOLUTION | Freq: Once | RESPIRATORY_TRACT | Status: AC
  Administered 2017-07-28: 5 mg via RESPIRATORY_TRACT

## 2017-07-28 MED ORDER — ALBUTEROL SULFATE (2.5 MG/3ML) 0.083% IN NEBU
INHALATION_SOLUTION | RESPIRATORY_TRACT | Status: DC
Start: 2017-07-28 — End: 2017-07-28
  Filled 2017-07-28: qty 6

## 2017-07-28 NOTE — ED Provider Notes (Signed)
[facility] DEPT Provider Note   CSN: [id number] Arrival date & time: 07/28/17  2703     History   Chief Complaint Chief Complaint  Patient presents with  . Leg Pain  . Shortness of Breath    HPI Rhonda Dominguez is a 56 y.o. female who presents to the emergency department with pain to the sole of the right foot 2 weeks. She reports that she was out on her front porch barefoot when she suddenly felt a bite to the sole of her foot. She reports the pain has worsened over the last 2 weeks and is complaining of a blue discoloration to the sole of the foot with associated foot and lower leg swelling. No left foot complaints. No recent falls or injuries. No fever or chills. No treatment prior to arrival.  She also complains of shortness of breath that began over the last few days. She reports that she has been using her albuterol inhaler more frequently since onset. She denies chest pain, cough, fever, or chills. She reports she is a former smoker and is established with pulmonology.   The history is provided by the patient. No language interpreter was used.    Past Medical History:  Diagnosis Date  . Depression   . Diabetes mellitus without complication ([place])   . Drug abuse   . Hypertension   . Pneumothorax     Patient Active Problem List   Diagnosis Date Noted  . Urinary incontinence 11/20/2016  . Complex regional pain syndrome 08/02/2016  . Restrictive lung disease 07/23/2016  . Toe pain 04/13/2016  . Right leg pain 03/22/2016  . Essential hypertension 03/22/2016  . Health care maintenance 12/14/2015  . High risk bisexual behavior 05/18/2015  . TOBACCO USER 07/09/2007  . ABUSE, COCAINE, CONTINUOUS 06/24/2007  . CATARACTS, BILATERAL 06/24/2007  . BIPOLAR AFFECTIVE DISORDER 10/03/2006  . CONSTIPATION 10/03/2006  . Eczema 10/03/2006    History reviewed. No pertinent surgical history.  OB History    No data available       Home Medications    Prior to  Admission medications   Medication Sig Start Date End Date Taking? Authorizing Provider  albuterol (PROAIR HFA) 108 (90 Base) MCG/ACT inhaler inhale 2 puffs every 6 hours if needed for wheezing or shortness of breath 07/10/17   Zenia Resides, MD  amLODipine (NORVASC) 10 MG tablet Take 1 tablet (10 mg total) by mouth daily. 07/22/16   Zenia Resides, MD  aspirin 81 MG tablet Take 81 mg by mouth daily.    [provider]  benztropine (COGENTIN) 1 MG tablet Take 0.5 tablets (0.5 mg total) by mouth daily. 06/14/16   Rumley, [place] N, DO  buPROPion (WELLBUTRIN XL) 300 MG 24 hr tablet Take 1 tablet (300 mg total) by mouth daily. 06/14/16   Rumley,  N, DO  divalproex (DEPAKOTE) 500 MG DR tablet Take 500 mg by mouth 2 (two) times daily.    [provider]  Fluticasone-Umeclidin-Vilant (TRELEGY ELLIPTA) 100-62.5-25 MCG/INH AEPB Inhale 1 puff into the lungs daily. 07/10/17   Zenia Resides, MD  gabapentin (NEURONTIN) 400 MG capsule Take 1 capsule (400 mg total) by mouth 3 (three) times daily. 06/05/17   Rumley, Burna Cash, DO  hydrochlorothiazide (HYDRODIURIL) 25 MG tablet Take 1 tablet (25 mg total) by mouth daily. 05/06/17   Lorna Few, DO  hydrOXYzine (ATARAX/VISTARIL) 25 MG tablet take 1 tablet by mouth every 6 hours if needed for itching 04/24/17   [place], [place]  N, DO  lamoTRIgine (LAMICTAL) 25 MG tablet Take 1 tablet (25 mg total) by mouth 2 (two) times daily. 06/14/16   Rumley, [place] N, DO  nicotine (NICODERM CQ - DOSED IN MG/24 HOURS) 14 mg/24hr patch Place 1 patch (14 mg total) onto the skin daily. 07/10/17   Zenia Resides, MD  nortriptyline (PAMELOR) 25 MG capsule Take 1 capsule (25 mg total) by mouth at bedtime. 06/05/17   Rumley, [place] N, DO  QUEtiapine (SEROQUEL) 50 MG tablet Take 1 tablet (50 mg total) by mouth at bedtime as needed. 08/28/16   Rumley, [place] N, DO  SPIRIVA HANDIHALER 18 MCG inhalation capsule Place 1 Inhaler into inhaler and inhale daily.  06/05/17   [provider]  triamcinolone cream (KENALOG) 0.1 % Apply 1 application topically 2 (two) times daily as needed. 06/14/16   Lorna Few, DO    Family History No family history on file.  Social History Social History  Substance Use Topics  . Smoking status: Light Tobacco Smoker  . Smokeless tobacco: Never Used     Comment: Quit in Late July 2017 - using patch  . Alcohol use No     Allergies   Patient has no known allergies.   Review of Systems Review of Systems  Constitutional: Negative for activity change, chills and fever.  Respiratory: Positive for shortness of breath. Negative for cough.   Cardiovascular: Negative for chest pain.  Gastrointestinal: Negative for abdominal pain.  Musculoskeletal: Positive for arthralgias (right leg and foot) and myalgias (right leg and foot). Negative for back pain.  Skin: Positive for color change and wound. Negative for rash.   Physical Exam Updated Vital Signs BP (!) 135/91   Pulse 66   Temp 98.5 F (36.9 C) (Oral)   Resp 18   Ht 5\' 7"  (1.702 m)   Wt 72.6 kg (160 lb)   SpO2 100%   BMI 25.06 kg/m   Physical Exam  Constitutional: No distress.  HENT:  Head: Normocephalic.  Eyes: Conjunctivae are normal.  Neck: Neck supple.  Cardiovascular: Normal rate, regular rhythm, normal heart sounds and intact distal pulses.  Exam reveals no gallop and no friction rub.   No murmur heard. Pulmonary/Chest: Effort normal and breath sounds normal. No respiratory distress. She has no wheezes. She has no rales.  Lungs are clear to auscultation bilaterally.  Abdominal: Soft. She exhibits no distension.  Musculoskeletal:  Able to move all digits of the bilateral feet. Full range of motion active and passive ROM of the bilateral ankles. DP and PT pulses are 2+ and equal. Sensation is intact throughout.  Neurological: She is alert.  Skin: Skin is warm. No rash noted. She is not diaphoretic.  Xerosis to the skin overlying  the sole of the foot with dirt noted to the skin creases. There is a 0.5 mm area of raised, yellowish keratotic skin to the sole of the mid-foot. No ecchymosis. No overlying erythema, edema, or warmth to the skin of the bilateral lower extremities and feet.   Psychiatric: Her behavior is normal.  Pressured, rapid speech.   Nursing note and vitals reviewed.  ED Treatments / Results  Labs (all labs ordered are listed, but only abnormal results are displayed) Labs Reviewed - No data to display  EKG  EKG Interpretation  Date/Time:  Monday July 28 2017 05:40:24 EDT Ventricular Rate:  97 PR Interval:  206 QRS Duration: 86 QT Interval:  372 QTC Calculation: 472 R Axis:   -7 Text Interpretation:  Normal sinus rhythm Normal ECG Nonspecific ST and T wave abnormality Confirmed by Ezequiel Essex [phone]) on 07/28/2017 5:51:39 AM Also confirmed by Ezequiel Essex [id number]), editor Hattie Perch ([id number])  on 07/28/2017 6:41:48 AM       Radiology Dg Chest 2 View  Result Date: 07/28/2017 CLINICAL DATA:  Shortness of breath, current smoker, history of pneumothorax, diabetes. EXAM: CHEST  2 VIEW COMPARISON:  PA and lateral chest x-ray of January 02, 2012 FINDINGS: The lungs are well-expanded. The interstitial markings are coarse but stable. There is no alveolar infiltrate. There is no pneumothorax or pleural effusion. The heart and pulmonary vascularity are normal. There is faint calcification in the wall of the aortic arch. The bony thorax exhibits no acute abnormality. IMPRESSION: Chronic bronchitic changes, stable. No pneumonia, CHF, nor other acute cardiopulmonary abnormality. Thoracic aortic atherosclerosis. Electronically Signed   By: David  Martinique M.D.   On: 07/28/2017 07:08    Procedures Procedures (including critical care time)  Medications Ordered in ED Medications  albuterol (PROVENTIL) (2.5 MG/3ML) 0.083% nebulizer solution 5 mg (5 mg Nebulization Given 07/28/17 0547)      Initial Impression / Assessment and Plan / ED Course  I have reviewed the triage vital signs and the nursing notes.  Pertinent labs & imaging results that were available during my care of the patient were reviewed by me and considered in my medical decision making (see chart for details).     56 year old female presenting with a callus to the sole of the right midfoot. No ecchymosis, abscess or signs of surrounding cellulitis or infection. No edema to the lower extremities. Normal right foot and ankle exam; good strength against resistance of the right leg or foot. Sensation is intact throughout. Salicylic acid treatments available over-the-counter were recommended for treatment in addition to good hygiene of the foot.   The patient also complains of dyspnea. No chest pain. CXR with chronic bronchitis changes. BP improving from 180s/115s to 130/90s. SaO2 98-100%. She was given an albuterol nebulizer treatment while in the ED. Lungs are clear to auscultation bilaterally on exam.   Final Clinical Impressions(s) / ED Diagnoses   Final diagnoses:  Callus of foot  Simple chronic bronchitis [facility])    New Prescriptions New Prescriptions   No medications on file     Joanne Gavel, PA-C 07/28/17 0827    Carmin Muskrat, MD 07/28/17 1553

## 2017-07-28 NOTE — ED Triage Notes (Signed)
Pt presents with multiple complaints. Reports she was bit by a bug on R foot and has 10/10 pain. Also reports she has been SOB for past few days. Pt has very mild exp wheezing. Breathing tx given in triage.

## 2017-07-28 NOTE — Discharge Instructions (Signed)
Please keep you follow-up appointment with your primary care provider later this week. Salicylic acid pads for calluses are available over the counter. Please continue to keep the skin of the foot clean and dry. You can also use a pumice stone to help smooth dry rough areas on the sole of the foot.   Please do not use more than 2 puffs of your albuterol inhaler every 4-6 hours because it can make your heart race. If you find that you are needing to use your inhaler more, please discuss this with your primary care provider or pulmonologist.   If the skin on the foot becomes red or hot or if you develop fever chills, please return to the emergency department for re-evaluation.

## 2017-07-28 NOTE — ED Notes (Signed)
Pt reports being bitten by a bug several days ago and believes it is infected.

## 2017-07-31 ENCOUNTER — Other Ambulatory Visit (HOSPITAL_COMMUNITY)
Admission: RE | Admit: 2017-07-31 | Discharge: 2017-07-31 | Disposition: A | Discharge status: Home / Self Care | Payer: Medicaid Other | Source: Ambulatory Visit | Attending: Family Medicine | Admitting: Family Medicine

## 2017-07-31 ENCOUNTER — Ambulatory Visit (INDEPENDENT_AMBULATORY_CARE_PROVIDER_SITE_OTHER): Payer: Medicaid Other | Admitting: Internal Medicine

## 2017-07-31 ENCOUNTER — Encounter: Payer: Self-pay | Admitting: Internal Medicine

## 2017-07-31 ENCOUNTER — Ambulatory Visit (INDEPENDENT_AMBULATORY_CARE_PROVIDER_SITE_OTHER): Payer: Medicaid Other | Admitting: Orthopedic Surgery

## 2017-07-31 VITALS — BP 126/64 | HR 86 | Temp 98.0°F | Ht 67.0 in | Wt 166.2 lb

## 2017-07-31 DIAGNOSIS — Z0000 Encounter for general adult medical examination without abnormal findings: Secondary | ICD-10-CM

## 2017-07-31 DIAGNOSIS — Z202 Contact with and (suspected) exposure to infections with a predominantly sexual mode of transmission: Secondary | ICD-10-CM | POA: Diagnosis not present

## 2017-07-31 DIAGNOSIS — I10 Essential (primary) hypertension: Secondary | ICD-10-CM

## 2017-07-31 NOTE — Progress Notes (Signed)
   Subjective:    Rhonda Dominguez - 56 y.o. female MRN [id number]  Date of birth: 02/16/1961  HPI  Rhonda Dominguez is here for an annual exam.   Risky Sexual Behavior: Patient requesting screening for STDs today. Reports she has been with same female partner on and off for 26 years. They typically use condoms but she is aware that he has many other partners and does not use condoms with these other partners.   HTN: Reports compliance with Amlodipine 10 mg and HCTZ 25 mg. No chest pain, changes in vision or headaches. Does not monitor BP at home.     Health Maintenance Due  Topic Date Due  . URINE MICROALBUMIN  05/11/1970  . COLONOSCOPY  05/11/2010  . INFLUENZA VACCINE  07/09/2017    -  reports that she has been smoking.  She has never used smokeless tobacco. - Review of Systems: Per HPI. - Past Medical History: Patient Active Problem List   Diagnosis Date Noted  . Urinary incontinence 11/20/2016  . Complex regional pain syndrome 08/02/2016  . Restrictive lung disease 07/23/2016  . Toe pain 04/13/2016  . Right leg pain 03/22/2016  . Essential hypertension 03/22/2016  . Health care maintenance 12/14/2015  . High risk bisexual behavior 05/18/2015  . TOBACCO USER 07/09/2007  . ABUSE, COCAINE, CONTINUOUS 06/24/2007  . CATARACTS, BILATERAL 06/24/2007  . BIPOLAR AFFECTIVE DISORDER 10/03/2006  . CONSTIPATION 10/03/2006  . Eczema 10/03/2006   - Medications: reviewed and updated   Objective:   Physical Exam BP 126/64   Pulse 86   Temp 98 F (36.7 C) (Oral)   Ht 5\' 7"  (1.702 m)   Wt 166 lb 3.2 oz (75.4 kg)   SpO2 97%   BMI 26.03 kg/m  Gen: NAD, alert, cooperative with exam, well-appearing HEENT: NCAT, PERRL, clear conjunctiva, oropharynx clear, supple neck CV: RRR, good S1/S2, no murmur, no edema, capillary refill brisk  Resp: CTABL, no wheezes, non-labored Abd: SNTND, BS present, no guarding or organomegaly Skin: no rashes, normal turgor  Neuro: no gross deficits.    Psych: good insight, alert and oriented    Assessment & Plan:   1. Exposure to sexually transmitted disease (STD) - RPR - HIV antibody (with reflex) - Hepatitis C antibody - Acute Hep Panel & Hep B Surface Ab - Urine cytology ancillary only  2. Essential hypertension Well controlled. Continue current regimen. Obtain BMET.  - Basic Metabolic Panel  3. Healthcare maintenance -patient reports had colonoscopy at [facility], HM ROI completed and message sent to health maintenance pool  -recommended mammogram    Phill Myron, D.O. 07/31/2017, 3:25 PM PGY-3, [place]

## 2017-07-31 NOTE — Assessment & Plan Note (Signed)
Well controlled. Continue current regimen. Obtain BMET.

## 2017-07-31 NOTE — Patient Instructions (Addendum)
We will check your blood work today and call you with the results. Please get your mammogram done as soon as possible.

## 2017-08-01 LAB — BASIC METABOLIC PANEL
BUN/Creatinine Ratio: 14 (ref 9–23)
BUN: 14 mg/dL (ref 6–24)
CHLORIDE: 105 mmol/L (ref 96–106)
CO2: 23 mmol/L (ref 20–29)
Calcium: 10.2 mg/dL (ref 8.7–10.2)
Creatinine, Ser: 1.02 mg/dL — ABNORMAL HIGH (ref 0.57–1.00)
GFR calc non Af Amer: 61 mL/min/{1.73_m2} (ref >59)
GFR, EST AFRICAN AMERICAN: 71 mL/min/{1.73_m2} (ref >59)
GLUCOSE: 98 mg/dL (ref 65–99)
POTASSIUM: 3.8 mmol/L (ref 3.5–5.2)
Sodium: 144 mmol/L (ref 134–144)

## 2017-08-01 LAB — URINE CYTOLOGY ANCILLARY ONLY
Chlamydia: NEGATIVE
Neisseria Gonorrhea: NEGATIVE

## 2017-08-01 LAB — ACUTE HEP PANEL AND HEP B SURFACE AB
HEP A IGM: NEGATIVE
HEP B C IGM: NEGATIVE
Hepatitis B Surface Ag: NEGATIVE

## 2017-08-01 LAB — HIV ANTIBODY (ROUTINE TESTING W REFLEX): HIV Screen 4th Generation wRfx: NONREACTIVE

## 2017-08-01 LAB — RPR: RPR Ser Ql: NONREACTIVE

## 2017-08-04 ENCOUNTER — Telehealth: Payer: Self-pay | Admitting: Internal Medicine

## 2017-08-04 ENCOUNTER — Telehealth: Payer: Self-pay | Admitting: *Deleted

## 2017-08-04 NOTE — Telephone Encounter (Signed)
Pt needs a referral faxed to the [facility] so she can schedule an appt for a mammogram.  Pt would like to talk to [place],

## 2017-08-04 NOTE — Telephone Encounter (Signed)
Patient calling for test results done at last office visit.

## 2017-08-05 NOTE — Telephone Encounter (Signed)
-----   Message from Nicolette Bang, DO sent at 08/05/2017  9:29 AM EDT ----- Please call patient to let her know that syphilis, hepatitis C and B, HIV, Gonorrhea and Chlamydia were all negative. Her results did indicate that she is not immune to Hep B so I would recommend that she be re-vaccinated at some point. Her electrolytes, kidney function, and blood sugar were all within normal limits as well.   Phill Myron, D.O. 08/05/2017, 9:29 AM PGY-3, [place]

## 2017-08-05 NOTE — Telephone Encounter (Signed)
Pt returned call and I informed her of below and scheduled her an appointment in nurse clinic to get her Hep B shot, also pt stated that she is having problems with her leg and knee and needs a doctor who can give her a brace.  She also stated the the doctor she was sent to was not taking new patients.  I think she said his name was Mckinley/Mckinsley??. So she wants to be sent to a different doctor. Katharina Caper, Vara Mairena D, [name]

## 2017-08-05 NOTE — Telephone Encounter (Signed)
Patient has follow up with orthopedics and they can give her a brace as needed. The doctor she requested a referral to is a family medicine doctor. If she is interested in seeing a different Family Medicine doctor she needs to let us know.   Phill Myron, D.O. 08/05/2017, 11:36 AM PGY-3, [place]

## 2017-08-05 NOTE — Telephone Encounter (Signed)
Patient does not need a referral for a mammogram. She can make an appointment at the [facility] at her earliest convenience.   Phill Myron, D.O. 08/05/2017, 9:15 AM PGY-3, [place]

## 2017-08-05 NOTE — Telephone Encounter (Signed)
Tried to contact pt to inform her of this and her phone only rang with no option to LVM.  If pt calls back please inform her of this and also let her know from another phone note that she does not need a referral for a mammogram she can just call and set up an appointment per Dr. Juleen China response in that phone note. Katharina Caper, Damarrion Mimbs D, [name]

## 2017-08-05 NOTE — Telephone Encounter (Signed)
[facility] and they stated that if it was only a screening then she did not need a referral, when I asked the pt about calling she said that she does have 2 knots in her breast.  Maybe she told them that and this is why a referral is needed. Routing to PCP to see next step. Katharina Caper, Hazyl Marseille D, [name]

## 2017-08-06 ENCOUNTER — Telehealth: Payer: Self-pay | Admitting: Internal Medicine

## 2017-08-06 NOTE — Telephone Encounter (Signed)
Spoke to pt. She asked that we refer her to who the Dr. thinks is best to look at her knee and rt wrist. "The Dr. Who did my foot surgery is fine if they do knee and wrist work."  I told her she would need to call them and schedule that appt. Pt asked for Dr. Juleen China to refer her to someone. Ottis Stain, CMA

## 2017-08-06 NOTE — Telephone Encounter (Signed)
Pt is calling and would like to speak to her doctor about this issues she is having with her legs. Rhonda Dominguez

## 2017-08-12 ENCOUNTER — Ambulatory Visit (INDEPENDENT_AMBULATORY_CARE_PROVIDER_SITE_OTHER): Payer: Self-pay | Admitting: *Deleted

## 2017-08-12 ENCOUNTER — Telehealth: Payer: Self-pay | Admitting: Internal Medicine

## 2017-08-12 ENCOUNTER — Telehealth: Payer: Self-pay | Admitting: *Deleted

## 2017-08-12 DIAGNOSIS — I10 Essential (primary) hypertension: Secondary | ICD-10-CM

## 2017-08-12 DIAGNOSIS — Z23 Encounter for immunization: Secondary | ICD-10-CM

## 2017-08-12 MED ORDER — AMLODIPINE BESYLATE 10 MG PO TABS: 10.0000 mg | ORAL_TABLET | Freq: Every day | ORAL | 3 refills | Status: DC

## 2017-08-12 MED ORDER — HYDROCHLOROTHIAZIDE 25 MG PO TABS: 25.0000 mg | ORAL_TABLET | Freq: Every day | ORAL | 3 refills | Status: DC
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
## 2017-08-12 NOTE — Telephone Encounter (Signed)
Patient needs a visit to discuss referral to dermatologist. She does not need a referral for a mammogram and can [facility] at her [facility]. She has been seen by several orthopedic doctors and at this point I'm not sure if another referral would be beneficial at this point without another visit. Her colonoscopy notes will be sent to this office.   Phill Myron, D.O. 08/12/2017, 11:24 AM PGY-3, [place]

## 2017-08-12 NOTE — Telephone Encounter (Signed)
Patient came to office stated to please call Dr Michail Sermon to discuss the Colon Cancer screening. (patient did not have his phone # ) however she had his fax # [phone].  Patient also stated she needs ref. To a Dermatologist & to [facility] for [place]. Patient may be reached at [phone]

## 2017-08-12 NOTE — Telephone Encounter (Signed)
Pt has appointment on 9/7 with PCP to discuss dermatology referral will give her the information about mammo and ortho at her visit. Tacy Dura Rumple, April D, CMA

## 2017-08-12 NOTE — Telephone Encounter (Signed)
Pt in office today to have hep b series started and she is requesting a referral to a new ortho doctor she said her case is closed at the ortho that did her surgery.  She also said she wanted a referral to a skin doctor and I told her she would probably need to be evaluated to get that referral. She has a lot of request and she says she wants to know when we are going to get her the doctors she is needing. Routing to PCP  [employer], Amori Cooperman D, [name]

## 2017-08-15 ENCOUNTER — Ambulatory Visit (INDEPENDENT_AMBULATORY_CARE_PROVIDER_SITE_OTHER): Payer: Medicaid Other | Admitting: Internal Medicine

## 2017-08-15 ENCOUNTER — Encounter: Payer: Self-pay | Admitting: Internal Medicine

## 2017-08-15 VITALS — BP 136/94 | HR 86 | Temp 98.4°F | Ht 67.0 in | Wt 167.0 lb

## 2017-08-15 DIAGNOSIS — R21 Rash and other nonspecific skin eruption: Secondary | ICD-10-CM

## 2017-08-15 DIAGNOSIS — N63 Unspecified lump in unspecified breast: Secondary | ICD-10-CM

## 2017-08-15 MED ORDER — TRIAMCINOLONE ACETONIDE 0.025 % EX OINT: 1.0000 "application " | TOPICAL_OINTMENT | Freq: Two times a day (BID) | CUTANEOUS | 0 refills | Status: DC

## 2017-08-15 NOTE — Patient Instructions (Signed)
Life Alert is not covered by Medicaid. If you are interested in the services you can call [phone] to speak to a representative.   I have ordered your mammogram.   I have prescribed a steroid ointment for your rash. Please make sure you have not changed laundry detergents, soaps, body washes, lotions, etc.   You can call sports medicine at [phone] for an appointment.

## 2017-08-15 NOTE — Progress Notes (Signed)
Subjective:    Rhonda Dominguez - 56 y.o. female MRN [id number]  Date of birth: 22-Apr-1961  HPI  Rhonda Dominguez is here for rash.  Rash: Patient reports worsening rash on her arms and legs for several weeks. It is very itchy. She is unsure if she has changed detergents, lotions, soaps etc. She has no new medications. She has no contacts with similar rash.   Lumps in Breast: At last visit had recommended routine mammogram. Patient reports she called to schedule but was told she needed a referral for me. She then reported one lump in each breast bilaterally. She denies nipple discharge. Lumps are bothersome when she presses on them. No known family history of breast cancer.   Also of note, patient is very distressed about her "Hepatitis C" diagnosis. Spent time at length reassuring patient that her Hep C was negative. She was also negative for Hep B but titers showed she was non-immune. She returned for re-vaccination earlier this week but believes she was getting shots to treat the Hep C. She has kicked out her boyfriend due to him giving her Hepatitis. Repeatedly explained to patient that she did not have Hepatitis but that she was simply no longer immune from her childhood vaccinations. Patient kept asking about how she got Hepatitis. Tried repeatedly to explain that she did not have any hepatitis virus.    -  reports that she has been smoking.  She has never used smokeless tobacco. - Review of Systems: Per HPI. - Past Medical History: Patient Active Problem List   Diagnosis Date Noted  . Urinary incontinence 11/20/2016  . Complex regional pain syndrome 08/02/2016  . Restrictive lung disease 07/23/2016  . Toe pain 04/13/2016  . Right leg pain 03/22/2016  . Essential hypertension 03/22/2016  . Health care maintenance 12/14/2015  . High risk bisexual behavior 05/18/2015  . TOBACCO USER 07/09/2007  . ABUSE, COCAINE, CONTINUOUS 06/24/2007  . CATARACTS, BILATERAL 06/24/2007  . BIPOLAR  AFFECTIVE DISORDER 10/03/2006  . CONSTIPATION 10/03/2006  . Eczema 10/03/2006   - Medications: reviewed and updated   Objective:   Physical Exam BP (!) 136/94   Pulse 86   Temp 98.4 F (36.9 C) (Oral)   Ht 5\' 7"  (1.702 m)   Wt 167 lb (75.8 kg)   SpO2 98%   BMI 26.16 kg/m  Gen: alert,  well-appearing Skin: extensor surfaces of arms and legs covered in excoriations with some areas of scabbing, similar findings present on the upper thoracic area of back, no drainage from lesions, no areas of erythema or increased warmth  Breast: patient refused breast exam.  Psych: agitated, tangential, tearful     Assessment & Plan:   1. Rash and nonspecific skin eruption Suspect neurodermatitis given wide spread excoriations in areas of the body reachable by fingernails. Especially likely given extensive psych history. Could potentially also be contact dermatitis but would have to be from change in soap or detergent given widespread distribution. Will treat symptomatically with steroid cream.  - triamcinolone (KENALOG) 0.025 % ointment; Apply 1 application topically 2 (two) times daily.  Dispense: 80 g; Refill: 0  2. Breast lump in female Patient refuses breast exam today. Will investigate further with mammogram. Was due for routine screening mammogram at this time.  - MM Digital Diagnostic Bilat; Future   Patient asked about Life Alert. Discussed with Lauren who stated Medicaid would not cover. Gave patient sales number if she wants to pursue this further.   Phill Myron,  D.O. 08/15/2017, 3:22 PM PGY-3, [place]

## 2017-08-20 ENCOUNTER — Other Ambulatory Visit: Payer: Self-pay | Admitting: Internal Medicine

## 2017-08-20 DIAGNOSIS — N63 Unspecified lump in unspecified breast: Secondary | ICD-10-CM

## 2017-08-25 ENCOUNTER — Ambulatory Visit: Payer: Medicaid Other | Admitting: Pharmacist

## 2017-08-25 ENCOUNTER — Ambulatory Visit: Payer: Medicaid Other | Admitting: Sports Medicine

## 2017-08-26 ENCOUNTER — Ambulatory Visit
Admission: RE | Admit: 2017-08-26 | Discharge: 2017-08-26 | Disposition: A | Discharge status: Home / Self Care | Payer: Medicaid Other | Source: Ambulatory Visit | Attending: Family Medicine | Admitting: Family Medicine

## 2017-08-26 DIAGNOSIS — N63 Unspecified lump in unspecified breast: Secondary | ICD-10-CM

## 2017-08-29 ENCOUNTER — Ambulatory Visit: Payer: Medicaid Other | Admitting: Internal Medicine

## 2017-09-01 ENCOUNTER — Ambulatory Visit: Payer: Medicaid Other | Admitting: Pharmacist

## 2017-09-02 ENCOUNTER — Telehealth (HOSPITAL_COMMUNITY): Payer: Self-pay

## 2017-09-02 NOTE — Telephone Encounter (Signed)
Spoke with [place] with transportation assistance in regard to [employer]. She was able to figure out where her appointment was, and it was not with the trauma clinic. She does not need to see the trauma clinic.  Brigid Re , [facility] 09/02/2017, 11:35 AM Pager: [phone] Mon-Fri 7:00 am-4:30 pm Sat-Sun 7:00 am-11:30 am

## 2017-09-02 NOTE — Telephone Encounter (Signed)
[phone] Rhonda Dominguez said she was to have appt. But Trauma Clinic. They say they do not have appt or show where one is needed.  Pt. Is upset and insist she is to have one.  Please confirm and call

## 2017-09-03 ENCOUNTER — Ambulatory Visit (INDEPENDENT_AMBULATORY_CARE_PROVIDER_SITE_OTHER): Payer: Medicaid Other | Admitting: Family Medicine

## 2017-09-03 VITALS — BP 132/88 | Ht 67.0 in | Wt 160.0 lb

## 2017-09-03 DIAGNOSIS — M79671 Pain in right foot: Secondary | ICD-10-CM

## 2017-09-03 DIAGNOSIS — G8929 Other chronic pain: Secondary | ICD-10-CM

## 2017-09-03 DIAGNOSIS — M79641 Pain in right hand: Secondary | ICD-10-CM | POA: Diagnosis present

## 2017-09-03 DIAGNOSIS — M25561 Pain in right knee: Secondary | ICD-10-CM | POA: Diagnosis not present

## 2017-09-03 NOTE — Patient Instructions (Signed)
I will call you after XR's are done to discuss results and next step in plan of care. It was a pleasure to meet you today!
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
## 2017-09-12 ENCOUNTER — Ambulatory Visit (INDEPENDENT_AMBULATORY_CARE_PROVIDER_SITE_OTHER): Payer: Medicaid Other | Admitting: *Deleted

## 2017-09-12 VITALS — Temp 98.3°F

## 2017-09-12 DIAGNOSIS — Z9229 Personal history of other drug therapy: Secondary | ICD-10-CM

## 2017-09-12 DIAGNOSIS — Z23 Encounter for immunization: Secondary | ICD-10-CM | POA: Diagnosis not present

## 2017-09-12 NOTE — Progress Notes (Signed)
Pt is here in nurse clinic for a Hep B and Flu shot today.  Pt given both with no issues. Tee Richeson, Salome Spotted, CMA

## 2017-09-17 ENCOUNTER — Encounter: Payer: Self-pay | Admitting: Internal Medicine

## 2017-09-17 ENCOUNTER — Ambulatory Visit (INDEPENDENT_AMBULATORY_CARE_PROVIDER_SITE_OTHER): Payer: Medicaid Other | Admitting: Internal Medicine

## 2017-09-17 VITALS — BP 142/86 | HR 62 | Temp 97.9°F | Ht 67.0 in | Wt 160.0 lb

## 2017-09-17 DIAGNOSIS — R21 Rash and other nonspecific skin eruption: Secondary | ICD-10-CM

## 2017-09-17 DIAGNOSIS — M79641 Pain in right hand: Secondary | ICD-10-CM | POA: Diagnosis present

## 2017-09-17 MED ORDER — TRIAMCINOLONE ACETONIDE 0.025 % EX OINT: 1.0000 "application " | TOPICAL_OINTMENT | Freq: Two times a day (BID) | CUTANEOUS | 1 refills | Status: DC

## 2017-09-17 MED ORDER — HYDROXYZINE HCL 25 MG PO TABS: ORAL_TABLET | ORAL | 6 refills | Status: DC
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
## 2017-09-19 ENCOUNTER — Ambulatory Visit: Payer: Medicaid Other | Admitting: Pharmacist

## 2017-09-22 ENCOUNTER — Encounter: Payer: Self-pay | Admitting: Internal Medicine

## 2017-09-22 ENCOUNTER — Telehealth: Payer: Self-pay | Admitting: *Deleted

## 2017-09-22 ENCOUNTER — Ambulatory Visit
Admission: RE | Admit: 2017-09-22 | Discharge: 2017-09-22 | Disposition: A | Discharge status: Home / Self Care | Payer: Medicaid Other | Source: Ambulatory Visit | Attending: Sports Medicine | Admitting: Sports Medicine

## 2017-09-22 DIAGNOSIS — G8929 Other chronic pain: Secondary | ICD-10-CM

## 2017-09-22 DIAGNOSIS — M79671 Pain in right foot: Secondary | ICD-10-CM

## 2017-09-22 DIAGNOSIS — M79641 Pain in right hand: Secondary | ICD-10-CM

## 2017-09-22 DIAGNOSIS — M25561 Pain in right knee: Secondary | ICD-10-CM

## 2017-09-22 NOTE — Progress Notes (Unsigned)
No encounter

## 2017-09-22 NOTE — Telephone Encounter (Signed)
Received fax from [place] stating patient received Flu shot on 09/19/2017. Reviewing patient's immunization record, she received Flu vaccine along with Hep B vaccine on 09/12/2017 at [facility].    [facility] is aware.  Patient also received Flu vaccine twice last flu season 2017.   Called patient to let her know that she received 2 doses of flu vaccine within a week apart. Pt denies any symptoms/side effects. Pt stated she does not remember getting the flu shot, but does remember getting her second Hep B vaccine, which third dose is due in December. Pt stated she takes about 15 different medications and if she miss one then she tends to forget things. Will forward to PCP.     Derl Barrow, RN

## 2017-09-23 ENCOUNTER — Telehealth: Payer: Self-pay | Admitting: Family Medicine
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
## 2017-09-23 NOTE — Addendum Note (Signed)
Addended by: Jolinda Croak E on: 09/23/2017 04:38 PM   Modules accepted: Orders

## 2017-09-24 NOTE — Addendum Note (Signed)
Addended by: Cyd Silence on: 09/24/2017 08:29 AM   Modules accepted: Orders

## 2017-09-24 NOTE — Addendum Note (Signed)
Addended by: Cyd Silence on: 09/24/2017 08:26 AM   Modules accepted: Orders

## 2017-09-25 ENCOUNTER — Ambulatory Visit (INDEPENDENT_AMBULATORY_CARE_PROVIDER_SITE_OTHER): Payer: Medicaid Other | Admitting: Pharmacist

## 2017-09-25 ENCOUNTER — Encounter: Payer: Self-pay | Admitting: Pharmacist

## 2017-09-25 DIAGNOSIS — J984 Other disorders of lung: Secondary | ICD-10-CM | POA: Diagnosis not present

## 2017-09-25 DIAGNOSIS — F17200 Nicotine dependence, unspecified, uncomplicated: Secondary | ICD-10-CM | POA: Diagnosis not present

## 2017-09-25 MED ORDER — FLUTICASONE-UMECLIDIN-VILANT 100-62.5-25 MCG/INH IN AEPB: 1.0000 | INHALATION_SPRAY | Freq: Every day | RESPIRATORY_TRACT | 6 refills | Status: DC

## 2017-09-25 MED ORDER — FLUTICASONE-UMECLIDIN-VILANT 100-62.5-25 MCG/INH IN AEPB: 1.0000 | INHALATION_SPRAY | Freq: Every day | RESPIRATORY_TRACT | 0 refills | Status: DC

## 2017-09-25 NOTE — Patient Instructions (Signed)
Thanks for coming to see Rhonda Dominguez today!   1. Keep taking the new inhaler - TRELEGY - once a day in the morning. Use your albuterol only when you need it.   2. Work on cutting down on smoking! Try to NOT SMOKE after breakfast.   We sent a prescription for the Trelegy to your pharmacy and are also giving you a sample. If they cannot get the Trelegy at the pharmacy, call Rhonda Dominguez.

## 2017-09-25 NOTE — Assessment & Plan Note (Signed)
#  Smoking - reduced tobacco use to ~1 cigarette/day, not using nicotine patches.  -Congratulated on progress and subsequent improvement in breathing -Set new goal of not smoking after breakfast

## 2017-09-25 NOTE — Progress Notes (Signed)
   S/O:    Patient arrives with elevated energy, positive attitude and ambulating without assistance.  Patient left prior to being seen several weeks ago. Patient reports breathing has been fairly well - using albuterol only PRN. Has used albuterol 2x since last visit for "asthma attack" brought on by pain and difficulty catching her breath while crying.   Believes Trelegy Elipta has helped breathing significantly.   Reports smoking after meals (only reports smoking a few puffs after breakfast and supper).  Not using nicotine patches.   Patient reports last dose of asthma medications was yesterday. Has been taking Trelegy BID.  States that she cannot remember using previous inhalers however, after chart review, patient has used [place] and Spiriva previously. Per chart review, patient continued to have uncontrolled sx on these inhalers.  Insomnia improved since taking Seroquel nightly. Endorses pain in legs and reports recent dx of arthritis in right toe. Taking ibuprofen 800 mg daily and gabapentin 400 mg BID (prescribed three times daily).   Has [place] Medicaid insurance.   A/P: #History of restrictive lung disease, improved control per patient report 2/2 improved medication adherence and reduction in tobacco use.  -Continued trelegy ellipta, 1 inhalation daily and albuterol PRN. Provided patient with 28 day supply of Trelegy and sent Rx to pharmacy. Trelegy will require a prior authorization, however this should not be an issue given h/o therapeutic failure on Dulera and Spiriva.  - Patient previously educated on purpose, proper use, potential adverse effects including risk of esophageal candidiasis and need to rinse mouth after each use of Trelegy.   #Smoking - reduced tobacco use to ~1 cigarette/day, not using nicotine patches.  -Congratulated on progress and subsequent improvement in breathing -Set new goal of not smoking after breakfast  #Leg pain, neuropathic and arthritic (per patient  report), currently uncontrolled on gabapentin 400 mg BID and ibuprofen 800 mg daily.  -Encouraged adherence to prescribed gabapentin 400 mg three times daily -Counseled on continued activity as able to help with arthritic pain  Has an appointment with PCP (Dr. Juleen China) on 10/02/2017 Written pt instructions provided.  F/U Clinic visit in ~ 4 weeks if still smoking.   Total time in face to face counseling 25 minutes.  Patient seen with Deirdre Pippins, PharmD, PGY2 Resident.

## 2017-09-25 NOTE — Progress Notes (Signed)
Patient ID: Rhonda Dominguez, female   DOB: January 23, 1961, 56 y.o.   MRN: [id number] Reviewed: Agree with Dr. Graylin Shiver documentation and management.

## 2017-09-25 NOTE — Assessment & Plan Note (Signed)
#  History of restrictive lung disease, improved control per patient report 2/2 improved medication adherence and reduction in tobacco use.  -Continued trelegy ellipta, 1 inhalation daily and albuterol PRN. Provided patient with 28 day supply of Trelegy and sent Rx to pharmacy. Trelegy will require a prior authorization, however this should not be an issue given h/o therapeutic failure on Dulera and Spiriva.  - Patient previously educated on purpose, proper use, potential adverse effects including risk of esophageal candidiasis and need to rinse mouth after each use of Trelegy.

## 2017-10-02 ENCOUNTER — Encounter: Payer: Self-pay | Admitting: Internal Medicine

## 2017-10-02 ENCOUNTER — Ambulatory Visit (INDEPENDENT_AMBULATORY_CARE_PROVIDER_SITE_OTHER): Payer: Medicaid Other | Admitting: Internal Medicine

## 2017-10-02 VITALS — BP 142/88 | HR 81 | Temp 98.0°F | Ht 67.0 in | Wt 162.8 lb

## 2017-10-02 DIAGNOSIS — G8929 Other chronic pain: Secondary | ICD-10-CM | POA: Diagnosis not present

## 2017-10-02 DIAGNOSIS — M25531 Pain in right wrist: Secondary | ICD-10-CM | POA: Diagnosis not present

## 2017-10-02 NOTE — Progress Notes (Signed)
   Subjective:    Rhonda Dominguez - 56 y.o. female MRN [id number]  Date of birth: 12/11/1960  HPI  Rhonda Dominguez is here for follow up. Patient in good spirits and elevated energy. Reports that she came by just to let me know that she was doing well. Wants to follow up to make sure pain management referral and referral to hand surgery placed. Otherwise, is quite anxious to get out of the office today and doesn't want to discuss anything else.   -  reports that she has been smoking.  She has never used smokeless tobacco. - Review of Systems: Per HPI. - Past Medical History: Patient Active Problem List   Diagnosis Date Noted  . Urinary incontinence 11/20/2016  . Complex regional pain syndrome 08/02/2016  . Restrictive lung disease 07/23/2016  . Toe pain 04/13/2016  . Right leg pain 03/22/2016  . Essential hypertension 03/22/2016  . Health care maintenance 12/14/2015  . High risk bisexual behavior 05/18/2015  . TOBACCO USER 07/09/2007  . ABUSE, COCAINE, CONTINUOUS 06/24/2007  . CATARACTS, BILATERAL 06/24/2007  . BIPOLAR AFFECTIVE DISORDER 10/03/2006  . CONSTIPATION 10/03/2006  . Eczema 10/03/2006   - Medications: reviewed and updated   Objective:   Physical Exam BP (!) 142/88   Pulse 81   Temp 98 F (36.7 C) (Oral)   Ht 5\' 7"  (1.702 m)   Wt 162 lb 12.8 oz (73.8 kg)   SpO2 98%   BMI 25.50 kg/m  Gen: NAD, alert, cooperative with exam, well-appearing Psych: tangential thought process, rapid speech     Assessment & Plan:   1. Chronic wrist pain, right Confirmed that referral to [facility] has been placed. Ortho office attempted to contact patient and did not receive answer. Patient given phone number to contact office. Pain management referral pending.   Phill Myron, D.O. 10/02/2017, 3:56 PM PGY-3, [place]

## 2017-10-23 ENCOUNTER — Encounter: Payer: Self-pay | Admitting: Nurse Practitioner

## 2017-10-23 ENCOUNTER — Ambulatory Visit: Payer: Medicaid Other | Attending: Nurse Practitioner | Admitting: Nurse Practitioner

## 2017-10-23 ENCOUNTER — Other Ambulatory Visit: Payer: Self-pay

## 2017-10-23 DIAGNOSIS — Z7409 Other reduced mobility: Secondary | ICD-10-CM | POA: Diagnosis not present

## 2017-10-23 DIAGNOSIS — Z79899 Other long term (current) drug therapy: Secondary | ICD-10-CM | POA: Insufficient documentation

## 2017-10-23 DIAGNOSIS — E119 Type 2 diabetes mellitus without complications: Secondary | ICD-10-CM | POA: Diagnosis not present

## 2017-10-23 DIAGNOSIS — F1190 Opioid use, unspecified, uncomplicated: Secondary | ICD-10-CM | POA: Insufficient documentation

## 2017-10-23 DIAGNOSIS — I10 Essential (primary) hypertension: Secondary | ICD-10-CM | POA: Diagnosis not present

## 2017-10-23 DIAGNOSIS — M25561 Pain in right knee: Secondary | ICD-10-CM | POA: Diagnosis not present

## 2017-10-23 DIAGNOSIS — G8929 Other chronic pain: Secondary | ICD-10-CM | POA: Insufficient documentation

## 2017-10-23 DIAGNOSIS — F319 Bipolar disorder, unspecified: Secondary | ICD-10-CM | POA: Diagnosis not present

## 2017-10-23 DIAGNOSIS — Z789 Other specified health status: Secondary | ICD-10-CM

## 2017-10-23 DIAGNOSIS — G894 Chronic pain syndrome: Secondary | ICD-10-CM | POA: Diagnosis not present

## 2017-10-23 DIAGNOSIS — M79674 Pain in right toe(s): Secondary | ICD-10-CM | POA: Diagnosis not present

## 2017-10-23 DIAGNOSIS — M899 Disorder of bone, unspecified: Secondary | ICD-10-CM | POA: Insufficient documentation

## 2017-10-23 DIAGNOSIS — M2021 Hallux rigidus, right foot: Secondary | ICD-10-CM

## 2017-10-23 DIAGNOSIS — M79604 Pain in right leg: Secondary | ICD-10-CM

## 2017-10-23 DIAGNOSIS — F1410 Cocaine abuse, uncomplicated: Secondary | ICD-10-CM | POA: Diagnosis not present

## 2017-10-23 NOTE — Patient Instructions (Signed)

## 2017-10-23 NOTE — Progress Notes (Signed)
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 (Class II) Increased incidence by 136%  >40 kg/m2 Extreme obesity (Class III) Increased incidence by 254%   BMI Readings from Last 4 Encounters:  10/23/17 24.75 kg/m  10/02/17 25.50 kg/m  09/25/17 25.56 kg/m  09/17/17 25.06 kg/m   Wt Readings from Last 4 Encounters:  10/23/17 158 lb (71.7 kg)  10/02/17 162 lb 12.8 oz (73.8 kg)  09/25/17 163 lb 3.2 oz (74 kg)  09/17/17 160 lb (72.6 kg)  Psych/Mental status: Alert, oriented x 3 (person, place, & time)       Eyes: PERLA Respiratory: No evidence of acute respiratory distress  Lumbar Spine Exam  Inspection: No masses, redness, or swelling Alignment: Symmetrical Functional ROM: Unrestricted ROM      Stability: No instability detected Muscle strength & Tone: Functionally intact Sensory: Unimpaired Palpation: No palpable anomalies       Provocative Tests: Lumbar Hyperextension and rotation test: evaluation deferred today       Patrick's Maneuver: evaluation deferred today                    Gait & Posture Assessment   Ambulation: Patient ambulates using a cane Gait: Uneven Posture:  WNL   Lower Extremity Exam    Side: Right lower extremity  Side: Left lower extremity  Inspection: Incision from previous surgery well-healed   Inspection: No masses, redness, swelling, or asymmetry. No contractures  Functional ROM: Pain restricted ROM        great toe   Functional ROM: Unrestricted ROM          Muscle strength & Tone: Functionally intact  Muscle strength & Tone: Functionally intact  Sensory: Unimpaired  Sensory: Unimpaired  Palpation: No palpable anomalies  Palpation: No palpable anomalies   Assessment  Primary Diagnosis & Pertinent Problem List: Diagnoses of Great toe pain, right (Primary Area of Pain), Chronic pain of right knee, Lower extremity pain, diffuse, right (Tertiary Area of Pain), Chronic pain syndrome, Disorder of bone, unspecified, Other reduced mobility, Other specified health status, and Other long term (current) drug therapy were pertinent to this visit.  Visit Diagnosis: 1. Great toe pain, right (Primary Area of Pain)   2. Chronic pain of right knee   3. Lower extremity pain, diffuse, right [facility] Area of Pain)   4. Chronic pain syndrome   5. Disorder of bone, unspecified   6. Other reduced mobility   7. Other specified health status   8. Other long term (current) drug therapy    Plan of Care  Initial treatment plan:  Please be advised that as per protocol, today's visit has been an evaluation only. We have not taken over the patient's controlled substance management.  Problem-specific plan: No problem-specific Assessment & Plan notes found for this encounter.  Ordered Lab-work, Procedure(s), Referral(s), & Consult(s): Orders Placed This Encounter  Procedures  . Compliance Drug Analysis, Ur  . Comp. Metabolic Panel (12)  . C-reactive protein  . Sedimentation rate  . Magnesium  . 25-Hydroxyvitamin D Lcms D2+D3  . Vitamin B12  . Drug Screen 10 W/Conf, Serum  . Ambulatory  referral to Psychology   Pharmacotherapy: Medications ordered:  No orders of the defined types were placed in this encounter.  Medications administered during this visit: Rhonda Dominguez had no medications administered during this visit.   Pharmacotherapy under consideration:  Opioid Analgesics: The patient was informed that there is no guarantee that she would be a candidate for opioid analgesics. The decision will be made following CDC guidelines. This decision will be based on the results of diagnostic studies, as well as Rhonda Dominguez's risk profile.  Membrane stabilizer: To be determined at a later time Muscle relaxant: To be determined at a later time NSAID: To be determined at a later time Other analgesic(s): To be determined at a later time   Interventional therapies under consideration: Ms. Raffety was informed that there is no guarantee that she would be a candidate for interventional therapies. The decision will be based on the results of diagnostic studies, as well as Rhonda Dominguez's risk profile.  Possible procedure(s): diagnostic Intra-articular right knee injection Diagnostic right knee genicular nerve block Small joint injection    Provider-requested follow-up: Return for 2nd Visit, w/ Dr. Dossie Arbour, after [facility] eval.  Future Appointments  Date Time Provider [place]  10/27/2017  8:30 AM Leavy Cella [facility]  12/03/2017  9:00 AM [facility] NURSE [facility] [place]    Primary Care Physician: Nicolette Bang, DO Location: [facility] Note by:  Date: 10/23/2017; Time: 2:48 PM  Pain Score Disclaimer: We use the NRS-11 scale. This is a self-reported, subjective measurement of pain severity with only modest accuracy.  It is used primarily to identify changes within a particular patient. It must be understood that outpatient pain scales are significantly less accurate that those used for research, where they can be  applied under ideal controlled circumstances with minimal exposure to variables. In reality, the score is likely to be a combination of pain intensity and pain affect, where pain affect describes the degree of emotional arousal or changes in action readiness caused by the sensory experience of pain. Factors such as social and work situation, setting, emotional state, anxiety levels, expectation, and prior pain experience may influence pain perception and show large inter-individual differences that may also be affected by time variables.  Patient instructions provided during this appointment: Patient Instructions    ____________________________________________________________________________________________  Appointment Policy Summary  It is our goal and responsibility to provide the medical community with assistance in the evaluation and management of patients with chronic pain. Unfortunately our resources are limited. Because we do not have an unlimited amount of time, or available appointments, we are required to closely monitor and manage their use. The following rules exist to maximize their use:  Patient's responsibilities: 1. Punctuality:  At what time should I arrive? You should be physically present in our office 30 minutes before your scheduled appointment. Your scheduled appointment is with your assigned healthcare provider. However, it takes 5-10 minutes to be "checked-in", and another 15 minutes for the nurses to do the admission. If you arrive to our office at the time you were given for your appointment, you will end up being at least 20-25 minutes late to your appointment with the provider. 2. Tardiness:  What happens if I arrive only a few minutes after my scheduled appointment time? You will need to reschedule your appointment. The cutoff is your appointment time. This is why it is so important that you arrive at least 30 minutes before that appointment. If you have an appointment  scheduled for 10:00 AM and you arrive at 10:01, you will be required to reschedule your appointment.  3. Plan ahead:  Always assume that you will encounter traffic on your way in. Plan for it. If you are dependent on a driver, make sure they understand these rules and the need to arrive early. 4. Other appointments and responsibilities:  Avoid scheduling any other appointments before or after your pain clinic appointments.  5. Be prepared:  Write down everything that you need to discuss with your healthcare provider and give this information to the admitting nurse. Write down the medications that you will need refilled. Bring your pills and bottles (even the empty ones), to all of your appointments, except for those where a procedure is scheduled. 6. No children or pets:  Find someone to take care of them. It is not appropriate to bring them in. 7. Scheduling changes:  We request "advanced notification" of any changes or cancellations. 8. Advanced notification:  Defined as a time period of more than 24 hours prior to the originally scheduled appointment. This allows for the appointment to be offered to other patients. 9. Rescheduling:  When a visit is rescheduled, it will require the cancellation of the original appointment. For this reason they both fall within the category of "Cancellations".  10. Cancellations:  They require advanced notification. Any cancellation less than 24 hours before the  appointment will be recorded as a "No Show". 11. No Show:  Defined as an unkept appointment where the patient failed to notify or declare to the practice their intention or inability  to keep the appointment.  Corrective process for repeat offenders:  1. Tardiness: Three (3) episodes of rescheduling due to late arrivals will be recorded as one (1) "No Show". 2. Cancellation or reschedule: Three (3) cancellations or rescheduling will be recorded as one (1) "No Show". 3. "No Shows": Three (3) "No Shows"  within a 12 month period will result in discharge from the practice.  ____________________________________________________________________________________________   ____________________________________________________________________________________________  Pain Scale  Introduction: The pain score used by this practice is the Verbal Numerical Rating Scale (VNRS-11). This is an 11-point scale. It is for adults and children 10 years or older. There are significant differences in how the pain score is reported, used, and applied. Forget everything you learned in the past and learn this scoring system.  General Information: The scale should reflect your current level of pain. Unless you are specifically asked for the level of your worst pain, or your average pain. If you are asked for one of these two, then it should be understood that it is over the past 24 hours.  Basic Activities of Daily Living (ADL): Personal hygiene, dressing, eating, transferring, and using restroom.  Instructions: Most patients tend to report their level of pain as a combination of two factors, their physical pain and their psychosocial pain. This last one is also known as "suffering" and it is reflection of how physical pain affects you socially and psychologically. From now on, report them separately. From this point on, when asked to report your pain level, report only your physical pain. Use the following table for reference.  Pain Clinic Pain Levels (0-5/10)  Pain Level Score  Description  No Pain 0   Mild pain 1 Nagging, annoying, but does not interfere with basic activities of daily living (ADL). Patients are able to eat, bathe, get dressed, toileting (being able to get on and off the toilet and perform personal hygiene functions), transfer (move in and out of bed or a chair without assistance), and maintain continence (able to control bladder and bowel functions). Blood pressure and heart rate are unaffected. A  normal heart rate for a healthy adult ranges from 60 to 100 bpm (beats per minute).   Mild to moderate pain 2 Noticeable and distracting. Impossible to hide from other people. More frequent flare-ups. Still possible to adapt and function close to normal. It can be very annoying and may have occasional stronger flare-ups. With discipline, patients may get used to it and adapt.   Moderate pain 3 Interferes significantly with activities of daily living (ADL). It becomes difficult to feed, bathe, get dressed, get on and off the toilet or to perform personal hygiene functions. Difficult to get in and out of bed or a chair without assistance. Very distracting. With effort, it can be ignored when deeply involved in activities.   Moderately severe pain 4 Impossible to ignore for more than a few minutes. With effort, patients may still be able to manage work or participate in some social activities. Very difficult to concentrate. Signs of autonomic nervous system discharge are evident: dilated pupils (mydriasis); mild sweating (diaphoresis); sleep interference. Heart rate becomes elevated (>115 bpm). Diastolic blood pressure (lower number) rises above 100 mmHg. Patients find relief in laying down and not moving.   Severe pain 5 Intense and extremely unpleasant. Associated with frowning face and frequent crying. Pain overwhelms the senses.  Ability to do any activity or maintain social relationships becomes significantly limited. Conversation becomes difficult. Pacing back and forth is common, as  getting into a comfortable position is nearly impossible. Pain wakes you up from deep sleep. Physical signs will be obvious: pupillary dilation; increased sweating; goosebumps; brisk reflexes; cold, clammy hands and feet; nausea, vomiting or dry heaves; loss of appetite; significant sleep disturbance with inability to fall asleep or to remain asleep. When persistent, significant weight loss is observed due to the complete  loss of appetite and sleep deprivation.  Blood pressure and heart rate becomes significantly elevated. Caution: If elevated blood pressure triggers a pounding headache associated with blurred vision, then the patient should immediately seek attention at an urgent or emergency care unit, as these may be signs of an impending stroke.    Emergency Department Pain Levels (6-10/10)  Emergency Room Pain 6 Severely limiting. Requires emergency care and should not be seen or managed at an outpatient pain management facility. Communication becomes difficult and requires great effort. Assistance to reach the emergency department may be required. Facial flushing and profuse sweating along with potentially dangerous increases in heart rate and blood pressure will be evident.   Distressing pain 7 Self-care is very difficult. Assistance is required to transport, or use restroom. Assistance to reach the emergency department will be required. Tasks requiring coordination, such as bathing and getting dressed become very difficult.   Disabling pain 8 Self-care is no longer possible. At this level, pain is disabling. The individual is unable to do even the most "basic" activities such as walking, eating, bathing, dressing, transferring to a bed, or toileting. Fine motor skills are lost. It is difficult to think clearly.   Incapacitating pain 9 Pain becomes incapacitating. Thought processing is no longer possible. Difficult to remember your own name. Control of movement and coordination are lost.   The worst pain imaginable 10 At this level, most patients pass out from pain. When this level is reached, collapse of the autonomic nervous system occurs, leading to a sudden drop in blood pressure and heart rate. This in turn results in a temporary and dramatic drop in blood flow to the brain, leading to a loss of consciousness. Fainting is one of the body's self defense mechanisms. Passing out puts the brain in a calmed state  and causes it to shut down for a while, in order to begin the healing process.    Summary: 1. Refer to this scale when providing [place] with your pain level. 2. Be accurate and careful when reporting your pain level. This will help with your care. 3. Over-reporting your pain level will lead to loss of credibility. 4. Even a level of 1/10 means that there is pain and will be treated at our facility. 5. High, inaccurate reporting will be documented as "Symptom Exaggeration", leading to loss of credibility and suspicions of possible secondary gains such as obtaining more narcotics, or wanting to appear disabled, for fraudulent reasons. 6. Only pain levels of 5 or below will be seen at our facility. 7. Pain levels of 6 and above will be sent to the Emergency Department and the appointment cancelled. ____________________________________________________________________________________________

## 2017-10-23 NOTE — Progress Notes (Signed)
Safety precautions to be maintained throughout the outpatient stay will include: orient to surroundings, keep bed in low position, maintain call bell within reach at all times, provide assistance with transfer out of bed and ambulation.  

## 2017-10-25 LAB — DRUG SCREEN 10 W/CONF, SERUM
AMPHETAMINES, IA: NEGATIVE ng/mL
Barbiturates, IA: NEGATIVE ug/mL
Benzodiazepines, IA: NEGATIVE ng/mL
Cocaine & Metabolite, IA: NEGATIVE ng/mL
METHADONE, IA: NEGATIVE ng/mL
OPIATES, IA: NEGATIVE ng/mL
OXYCODONES, IA: NEGATIVE ng/mL
PROPOXYPHENE, IA: NEGATIVE ng/mL
Phencyclidine, IA: NEGATIVE ng/mL
THC(Marijuana) Metabolite, IA: NEGATIVE ng/mL

## 2017-10-27 ENCOUNTER — Ambulatory Visit: Payer: Medicaid Other | Admitting: Pharmacist

## 2017-11-01 LAB — 25-HYDROXY VITAMIN D LCMS D2+D3: 25-Hydroxy, Vitamin D-2: <1 ng/mL

## 2017-11-01 LAB — COMP. METABOLIC PANEL (12)
A/G RATIO: 1.4 (ref 1.2–2.2)
AST: 18 IU/L (ref 0–40)
Albumin: 4.6 g/dL (ref 3.5–5.5)
Alkaline Phosphatase: 101 IU/L (ref 39–117)
BUN / CREAT RATIO: 13 (ref 9–23)
BUN: 13 mg/dL (ref 6–24)
Bilirubin Total: 0.2 mg/dL (ref 0.0–1.2)
CALCIUM: 9.7 mg/dL (ref 8.7–10.2)
CHLORIDE: 100 mmol/L (ref 96–106)
Creatinine, Ser: 1 mg/dL (ref 0.57–1.00)
GFR, EST AFRICAN AMERICAN: 72 mL/min/{1.73_m2} (ref >59)
GFR, EST NON AFRICAN AMERICAN: 63 mL/min/{1.73_m2} (ref >59)
GLOBULIN, TOTAL: 3.3 g/dL (ref 1.5–4.5)
GLUCOSE: 83 mg/dL (ref 65–99)
POTASSIUM: 3.9 mmol/L (ref 3.5–5.2)
Sodium: 138 mmol/L (ref 134–144)
TOTAL PROTEIN: 7.9 g/dL (ref 6.0–8.5)

## 2017-11-01 LAB — C-REACTIVE PROTEIN: CRP: 12.8 mg/L — ABNORMAL HIGH (ref 0.0–4.9)

## 2017-11-01 LAB — SEDIMENTATION RATE: Sed Rate: 20 mm/hr (ref 0–40)

## 2017-11-01 LAB — MAGNESIUM: MAGNESIUM: 2.5 mg/dL — AB (ref 1.6–2.3)

## 2017-11-01 LAB — 25-HYDROXYVITAMIN D LCMS D2+D3
25-HYDROXY, VITAMIN D-3: 20 ng/mL
25-HYDROXY, VITAMIN D: 20 ng/mL — AB

## 2017-11-01 LAB — VITAMIN B12: VITAMIN B 12: 359 pg/mL (ref 232–1245)

## 2017-11-04 ENCOUNTER — Encounter (INDEPENDENT_AMBULATORY_CARE_PROVIDER_SITE_OTHER): Payer: Self-pay | Admitting: Orthopaedic Surgery

## 2017-11-04 ENCOUNTER — Ambulatory Visit (INDEPENDENT_AMBULATORY_CARE_PROVIDER_SITE_OTHER): Payer: Medicaid Other | Admitting: Orthopaedic Surgery

## 2017-11-04 DIAGNOSIS — M19041 Primary osteoarthritis, right hand: Secondary | ICD-10-CM | POA: Diagnosis not present

## 2017-11-04 DIAGNOSIS — M1711 Unilateral primary osteoarthritis, right knee: Secondary | ICD-10-CM

## 2017-11-04 MED ORDER — MELOXICAM 7.5 MG PO TABS
7.5000 mg | ORAL_TABLET | Freq: Two times a day (BID) | ORAL | 2 refills | Status: DC | PRN
Start: 2017-11-04 — End: 2018-05-01

## 2017-11-04 MED ORDER — DICLOFENAC SODIUM 1 % TD GEL: 2.0000 g | Freq: Four times a day (QID) | TRANSDERMAL | 5 refills | Status: DC

## 2017-11-04 NOTE — Progress Notes (Signed)
Office Visit Note   Patient: Rhonda Dominguez           Date of Birth: 1961-01-24           MRN: [id number] Visit Date: 11/04/2017              Requested by: Nicolette Bang, DO [address] [place], [place] [address] PCP: Nicolette Bang, DO   Assessment & Plan: Visit Diagnoses:  1. Primary osteoarthritis, right hand   2. Unilateral primary osteoarthritis, right knee     Plan: Impression is right hand and knee osteoarthritis and dystrophic pain syndrome.  Prescription for Voltaren gel and meloxicam.  Knee brace and right thumb provided today.  Questions encouraged and answered.  Follow-up as needed. Total face to face encounter time was greater than 45 minutes and over half of this time was spent in counseling and/or coordination of care.  Follow-Up Instructions: Return if symptoms worsen or fail to improve.   Orders:  No orders of the defined types were placed in this encounter.  Meds ordered this encounter  Medications  . meloxicam (MOBIC) 7.5 MG tablet    Sig: Take 1 tablet (7.5 mg total) by mouth 2 (two) times daily as needed for pain.    Dispense:  30 tablet    Refill:  2  . diclofenac sodium (VOLTAREN) 1 % GEL    Sig: Apply 2 g topically 4 (four) times daily.    Dispense:  1 Tube    Refill:  5      Procedures: No procedures performed   Clinical Data: No additional findings.   Subjective: Chief Complaint  Patient presents with  . Right Hand - Pain  . Right Knee - Pain    Patient is a 56 year old female presenting with right knee and right hand pain since March.  She complains of chronic pain.  She denies any numbness and tingling.  She is a poor historian and her history is difficult to follow.  Pain is worse with use and better with rest.  Denies any swelling.    Review of Systems  Constitutional: Negative.   HENT: Negative.   Eyes: Negative.   Respiratory: Negative.   Cardiovascular: Negative.   Endocrine: Negative.     Musculoskeletal: Negative.   Neurological: Negative.   Hematological: Negative.   Psychiatric/Behavioral: Negative.   All other systems reviewed and are negative.    Objective: Vital Signs: There were no vitals taken for this visit.  Physical Exam  Constitutional: She is oriented to person, place, and time. She appears well-developed and well-nourished.  HENT:  Head: Normocephalic and atraumatic.  Eyes: EOM are normal.  Neck: Neck supple.  Pulmonary/Chest: Effort normal.  Abdominal: Soft.  Neurological: She is alert and oriented to person, place, and time.  Skin: Skin is warm. Capillary refill takes less than 2 seconds.  Psychiatric: She has a normal mood and affect. Her behavior is normal. Judgment and thought content normal.  Nursing note and vitals reviewed.   Ortho Exam Right knee exam is essentially benign.  No joint effusion. Right hand exam shows tenderness throughout the hand with palpation.  She is withdrawing to any light palpation. Specialty Comments:  No specialty comments available.  Imaging: No results found.   PMFS History: Patient Active Problem List   Diagnosis Date Noted  . Opiate use 10/23/2017  . Great toe pain, right (Primary Area of Pain) 10/23/2017  . Knee pain, chronic (Secondary Area of Pain) (right) 10/23/2017  .  Lower extremity pain, diffuse, right [facility] Area of Pain) 10/23/2017  . Chronic pain syndrome 10/23/2017  . Disorder of bone, unspecified 10/23/2017  . Other reduced mobility 10/23/2017  . Other specified health status 10/23/2017  . Other long term (current) drug therapy 10/23/2017  . Urinary incontinence 11/20/2016  . Complex regional pain syndrome 08/02/2016  . Restrictive lung disease 07/23/2016  . Toe pain 04/13/2016  . Right leg pain 03/22/2016  . Essential hypertension 03/22/2016  . Health care maintenance 12/14/2015  . High risk bisexual behavior 05/18/2015  . TOBACCO USER 07/09/2007  . ABUSE, COCAINE, CONTINUOUS  06/24/2007  . CATARACTS, BILATERAL 06/24/2007  . BIPOLAR AFFECTIVE DISORDER 10/03/2006  . CONSTIPATION 10/03/2006  . Eczema 10/03/2006   Past Medical History:  Diagnosis Date  . Depression   . Diabetes mellitus without complication ([place])   . Drug abuse ([place])   . Hypertension   . Pneumothorax     History reviewed. No pertinent family history.  History reviewed. No pertinent surgical history. Social History   Occupational History  . Not on file  Tobacco Use  . Smoking status: Current Some Day Smoker  . Smokeless tobacco: Never Used  . Tobacco comment: Quit in Late July 2017 - using patch  Substance and Sexual Activity  . Alcohol use: No  . Drug use: No  . Sexual activity: Not on file

## 2017-11-06 ENCOUNTER — Ambulatory Visit: Payer: Medicaid Other | Admitting: Pharmacist

## 2017-11-09 DIAGNOSIS — G8929 Other chronic pain: Secondary | ICD-10-CM | POA: Insufficient documentation

## 2017-11-09 DIAGNOSIS — M25561 Pain in right knee: Secondary | ICD-10-CM

## 2017-11-09 NOTE — Progress Notes (Signed)
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 Inspection: No masses, redness, or swelling Alignment: Symmetrical Functional ROM: Unrestricted ROM      Stability: No instability detected Muscle Tone/Strength: Functionally intact. No obvious neuro-muscular anomalies detected. Sensory (Neurological): Unimpaired Palpation: No palpable anomalies       Provocative Tests: Lumbar Hyperextension and rotation test: evaluation deferred today       Lumbar Lateral bending test: evaluation deferred today       Patrick's Maneuver: evaluation deferred today                    Gait & Posture Assessment  Ambulation: Unassisted Gait: Relatively normal for age and body habitus Posture: WNL   Lower Extremity Exam    Side: Right lower extremity  Side: Left lower extremity  Skin & Extremity Inspection: Skin color, temperature, and hair growth are WNL. No peripheral  edema or cyanosis. No masses, redness, swelling, asymmetry, or associated skin lesions. No contractures.  Skin & Extremity Inspection: Skin color, temperature, and hair growth are WNL. No peripheral edema or cyanosis. No masses, redness, swelling, asymmetry, or associated skin lesions. No contractures.  Functional ROM: Unrestricted ROM          Functional ROM: Unrestricted ROM          Muscle Tone/Strength: Functionally intact. No obvious neuro-muscular anomalies detected.  Muscle Tone/Strength: Functionally intact. No obvious neuro-muscular anomalies detected.  Sensory (Neurological): Unimpaired  Sensory (Neurological): Unimpaired  Palpation: No palpable anomalies  Palpation: No palpable anomalies   Assessment & Plan  Primary Diagnosis & Pertinent Problem List: The primary encounter diagnosis was Great toe pain, right (Primary Area of Pain). Diagnoses of Knee pain, chronic (Secondary Area of Pain) (right), Lower extremity pain, diffuse, right (Tertiary Area of Pain), Complex regional pain syndrome type 1 of right lower extremity, DDD (degenerative disc disease), cervical, Chronic shoulder pain (Bilateral) (L>R), Chronic pain syndrome, Neurogenic pain, Osteoarthritis, NSAID long-term use, Nicotine dependence, Cocaine abuse ([place]), Dextroscoliosis, Elevated C-reactive protein (CRP), Vitamin D deficiency, and Problems influencing health status were also pertinent to this visit.  Visit Diagnosis: 1. Great toe pain, right (Primary Area of Pain)   2. Knee pain, chronic (Secondary Area of Pain) (right)   3. Lower extremity pain, diffuse, right [facility] Area of Pain)   4. Complex regional pain syndrome type 1 of right lower extremity   5. DDD (degenerative disc disease), cervical   6. Chronic shoulder pain (Bilateral) (L>R)   7. Chronic pain syndrome   8. Neurogenic pain   9. Osteoarthritis   10. NSAID long-term use   11. Nicotine dependence   12. Cocaine abuse ([place])   13. Dextroscoliosis   14.  Elevated C-reactive protein (CRP)   15. Vitamin D deficiency   16. Problems influencing health status    Problems updated and reviewed during  this visit: Problem  Complex regional pain syndrome type 1 of lower extremity (Right)  Osteoarthritis  Neurogenic Pain  Chronic shoulder pain (Bilateral) (L>R)  Dextroscoliosis  Ddd (Degenerative Disc Disease), Cervical  Great toe pain (Primary Area of Pain) (Right)  Chronic lower extremity pain (Tertiary Area of Pain) (Right)  Chronic Pain Syndrome  Elevated C-Reactive Protein (Crp)  Vitamin D Deficiency  Cocaine Abuse ([facility])  Problems Influencing Health Status  Nicotine dependence  Nsaid Long-Term Use  Disorder of Skeletal System  Pharmacologic Therapy  Bipolar Affective Disorder ([facility])   Annotation: followed - Mental Health /Dr. Wallace Going Qualifier: Diagnosis of  By: Eddie Dibbles MD, [place] [facility]  Pharmacotherapy (Medications Ordered): Meds ordered this encounter  Medications  . Calcium Carb-Cholecalciferol (CALCIUM PLUS D3 ABSORBABLE) [phone] MG-UNIT CAPS    Sig: Take 1 capsule by mouth daily with breakfast.    Dispense:  90 capsule    Refill:  0    Do not place medication on "Automatic Refill". Fill one day early if pharmacy is closed on scheduled refill date.  . ergocalciferol (VITAMIN D2) 50000 units capsule    Sig: Take 1 capsule (50,000 Units total) by mouth 2 (two) times a week. X 6 weeks.    Dispense:  12 capsule    Refill:  0    Do not add this medication to the electronic "Automatic Refill" notification system. Patient may have prescription filled one day early if pharmacy is closed on scheduled refill date.  . Cholecalciferol 5000 units capsule    Sig: Take 1 capsule (5,000 Units total) by mouth daily.    Dispense:  90 capsule    Refill:  0    Do not place medication on "Automatic Refill". Fill one day early if pharmacy is closed on scheduled refill date.   Procedure Orders    No procedure(s) ordered today    Lab Orders  No laboratory test(s) ordered today   Imaging Orders  No imaging studies ordered today   Referral Orders  No referral(s) requested today    Pharmacological management options:  Opioid Analgesics: Not an appropriate candidate for opioid therapy Membrane stabilizer: I will not be prescribing any at this time Muscle relaxant: I will not be prescribing any at this time NSAID: I will not be prescribing any at this time Other analgesic(s): I will not be prescribing any at this time   Interventional management options: Planned, scheduled, and/or pending:    None at this time. The patient indicates not being interested in any interventional therapies. She is not interested in our treatment plan. No return appointment.    Considering:   Diagnostic Intra-articular right knee injection Diagnostic right knee genicular nerve block Diagnostic Small joint injection  into the area of the right big toe    PRN Procedures:   None at this time   Provider-requested follow-up: Return for No return appointment. Patient not interested in our treatment plan..  Future Appointments  Date Time Provider [place]  12/03/2017  9:00 AM [facility] NURSE [facility] [place]    Primary Care Physician: Rhonda Bang, DO Location: [facility] Note by: Rhonda Cola, MD Date: 11/10/2017; Time: 2:07 PM

## 2017-11-10 ENCOUNTER — Other Ambulatory Visit: Payer: Self-pay

## 2017-11-10 ENCOUNTER — Ambulatory Visit: Payer: Medicaid Other | Attending: Pain Medicine | Admitting: Pain Medicine

## 2017-11-10 ENCOUNTER — Encounter: Payer: Self-pay | Admitting: Pain Medicine

## 2017-11-10 VITALS — BP 171/104 | HR 87 | Temp 98.2°F | Resp 18 | Ht 67.0 in | Wt 159.0 lb

## 2017-11-10 DIAGNOSIS — Z789 Other specified health status: Secondary | ICD-10-CM | POA: Insufficient documentation

## 2017-11-10 DIAGNOSIS — M25511 Pain in right shoulder: Secondary | ICD-10-CM | POA: Insufficient documentation

## 2017-11-10 DIAGNOSIS — M150 Primary generalized (osteo)arthritis: Secondary | ICD-10-CM | POA: Diagnosis not present

## 2017-11-10 DIAGNOSIS — Z7982 Long term (current) use of aspirin: Secondary | ICD-10-CM | POA: Insufficient documentation

## 2017-11-10 DIAGNOSIS — I10 Essential (primary) hypertension: Secondary | ICD-10-CM | POA: Diagnosis not present

## 2017-11-10 DIAGNOSIS — F17210 Nicotine dependence, cigarettes, uncomplicated: Secondary | ICD-10-CM

## 2017-11-10 DIAGNOSIS — E559 Vitamin D deficiency, unspecified: Secondary | ICD-10-CM | POA: Insufficient documentation

## 2017-11-10 DIAGNOSIS — Z87891 Personal history of nicotine dependence: Secondary | ICD-10-CM | POA: Insufficient documentation

## 2017-11-10 DIAGNOSIS — Z720 Tobacco use: Secondary | ICD-10-CM | POA: Diagnosis not present

## 2017-11-10 DIAGNOSIS — M79601 Pain in right arm: Secondary | ICD-10-CM | POA: Diagnosis not present

## 2017-11-10 DIAGNOSIS — G90521 Complex regional pain syndrome I of right lower limb: Secondary | ICD-10-CM

## 2017-11-10 DIAGNOSIS — F319 Bipolar disorder, unspecified: Secondary | ICD-10-CM | POA: Diagnosis not present

## 2017-11-10 DIAGNOSIS — M25561 Pain in right knee: Secondary | ICD-10-CM | POA: Diagnosis not present

## 2017-11-10 DIAGNOSIS — M79604 Pain in right leg: Secondary | ICD-10-CM | POA: Diagnosis not present

## 2017-11-10 DIAGNOSIS — M25512 Pain in left shoulder: Secondary | ICD-10-CM | POA: Insufficient documentation

## 2017-11-10 DIAGNOSIS — M419 Scoliosis, unspecified: Secondary | ICD-10-CM | POA: Insufficient documentation

## 2017-11-10 DIAGNOSIS — R32 Unspecified urinary incontinence: Secondary | ICD-10-CM | POA: Diagnosis not present

## 2017-11-10 DIAGNOSIS — R7982 Elevated C-reactive protein (CRP): Secondary | ICD-10-CM | POA: Diagnosis not present

## 2017-11-10 DIAGNOSIS — M5030 Other cervical disc degeneration, unspecified cervical region: Secondary | ICD-10-CM | POA: Insufficient documentation

## 2017-11-10 DIAGNOSIS — M79674 Pain in right toe(s): Secondary | ICD-10-CM | POA: Diagnosis not present

## 2017-11-10 DIAGNOSIS — M1990 Unspecified osteoarthritis, unspecified site: Secondary | ICD-10-CM | POA: Diagnosis not present

## 2017-11-10 DIAGNOSIS — M792 Neuralgia and neuritis, unspecified: Secondary | ICD-10-CM

## 2017-11-10 DIAGNOSIS — G894 Chronic pain syndrome: Secondary | ICD-10-CM

## 2017-11-10 DIAGNOSIS — F1410 Cocaine abuse, uncomplicated: Secondary | ICD-10-CM | POA: Insufficient documentation

## 2017-11-10 DIAGNOSIS — Z79891 Long term (current) use of opiate analgesic: Secondary | ICD-10-CM | POA: Insufficient documentation

## 2017-11-10 DIAGNOSIS — G8929 Other chronic pain: Secondary | ICD-10-CM

## 2017-11-10 DIAGNOSIS — Z791 Long term (current) use of non-steroidal anti-inflammatories (NSAID): Secondary | ICD-10-CM | POA: Diagnosis not present

## 2017-11-10 DIAGNOSIS — M159 Polyosteoarthritis, unspecified: Secondary | ICD-10-CM | POA: Insufficient documentation

## 2017-11-10 DIAGNOSIS — Z79899 Other long term (current) drug therapy: Secondary | ICD-10-CM | POA: Diagnosis not present

## 2017-11-10 DIAGNOSIS — M4180 Other forms of scoliosis, site unspecified: Secondary | ICD-10-CM | POA: Insufficient documentation

## 2017-11-10 MED ORDER — ERGOCALCIFEROL 1.25 MG (50000 UT) PO CAPS: 50000.0000 [IU] | ORAL_CAPSULE | ORAL | 0 refills | Status: AC

## 2017-11-10 MED ORDER — CALCIUM PLUS D3 ABSORBABLE 600-2500 MG-UNIT PO CAPS: 1.0000 | ORAL_CAPSULE | Freq: Every day | ORAL | 0 refills | Status: DC

## 2017-11-10 MED ORDER — CHOLECALCIFEROL 125 MCG (5000 UT) PO CAPS: 5000.0000 [IU] | ORAL_CAPSULE | Freq: Every day | ORAL | 0 refills | Status: AC

## 2017-11-10 NOTE — Patient Instructions (Addendum)
____________________________________________________________________________________________  Pain Scale  Introduction: The pain score used by this practice is the Verbal Numerical Rating Scale (VNRS-11). This is an 11-point scale. It is for adults and children 10 years or older. There are significant differences in how the pain score is reported, used, and applied. Forget everything you learned in the past and learn this scoring system.  General Information: The scale should reflect your current level of pain. Unless you are specifically asked for the level of your worst pain, or your average pain. If you are asked for one of these two, then it should be understood that it is over the past 24 hours.  Basic Activities of Daily Living (ADL): Personal hygiene, dressing, eating, transferring, and using restroom.  Instructions: Most patients tend to report their level of pain as a combination of two factors, their physical pain and their psychosocial pain. This last one is also known as "suffering" and it is reflection of how physical pain affects you socially and psychologically. From now on, report them separately. From this point on, when asked to report your pain level, report only your physical pain. Use the following table for reference.  Pain Clinic Pain Levels (0-5/10)  Pain Level Score  Description  No Pain 0   Mild pain 1 Nagging, annoying, but does not interfere with basic activities of daily living (ADL). Patients are able to eat, bathe, get dressed, toileting (being able to get on and off the toilet and perform personal hygiene functions), transfer (move in and out of bed or a chair without assistance), and maintain continence (able to control bladder and bowel functions). Blood pressure and heart rate are unaffected. A normal heart rate for a healthy adult ranges from 60 to 100 bpm (beats per minute).   Mild to moderate pain 2 Noticeable and distracting. Impossible to hide from other  people. More frequent flare-ups. Still possible to adapt and function close to normal. It can be very annoying and may have occasional stronger flare-ups. With discipline, patients may get used to it and adapt.   Moderate pain 3 Interferes significantly with activities of daily living (ADL). It becomes difficult to feed, bathe, get dressed, get on and off the toilet or to perform personal hygiene functions. Difficult to get in and out of bed or a chair without assistance. Very distracting. With effort, it can be ignored when deeply involved in activities.   Moderately severe pain 4 Impossible to ignore for more than a few minutes. With effort, patients may still be able to manage work or participate in some social activities. Very difficult to concentrate. Signs of autonomic nervous system discharge are evident: dilated pupils (mydriasis); mild sweating (diaphoresis); sleep interference. Heart rate becomes elevated (>115 bpm). Diastolic blood pressure (lower number) rises above 100 mmHg. Patients find relief in laying down and not moving.   Severe pain 5 Intense and extremely unpleasant. Associated with frowning face and frequent crying. Pain overwhelms the senses.  Ability to do any activity or maintain social relationships becomes significantly limited. Conversation becomes difficult. Pacing back and forth is common, as getting into a comfortable position is nearly impossible. Pain wakes you up from deep sleep. Physical signs will be obvious: pupillary dilation; increased sweating; goosebumps; brisk reflexes; cold, clammy hands and feet; nausea, vomiting or dry heaves; loss of appetite; significant sleep disturbance with inability to fall asleep or to remain asleep. When persistent, significant weight loss is observed due to the complete loss of appetite and sleep deprivation.  Blood   pressure and heart rate becomes significantly elevated. Caution: If elevated blood pressure triggers a pounding headache  associated with blurred vision, then the patient should immediately seek attention at an urgent or emergency care unit, as these may be signs of an impending stroke.    Emergency Department Pain Levels (6-10/10)  Emergency Room Pain 6 Severely limiting. Requires emergency care and should not be seen or managed at an outpatient pain management facility. Communication becomes difficult and requires great effort. Assistance to reach the emergency department may be required. Facial flushing and profuse sweating along with potentially dangerous increases in heart rate and blood pressure will be evident.   Distressing pain 7 Self-care is very difficult. Assistance is required to transport, or use restroom. Assistance to reach the emergency department will be required. Tasks requiring coordination, such as bathing and getting dressed become very difficult.   Disabling pain 8 Self-care is no longer possible. At this level, pain is disabling. The individual is unable to do even the most "basic" activities such as walking, eating, bathing, dressing, transferring to a bed, or toileting. Fine motor skills are lost. It is difficult to think clearly.   Incapacitating pain 9 Pain becomes incapacitating. Thought processing is no longer possible. Difficult to remember your own name. Control of movement and coordination are lost.   The worst pain imaginable 10 At this level, most patients pass out from pain. When this level is reached, collapse of the autonomic nervous system occurs, leading to a sudden drop in blood pressure and heart rate. This in turn results in a temporary and dramatic drop in blood flow to the brain, leading to a loss of consciousness. Fainting is one of the body's self defense mechanisms. Passing out puts the brain in a calmed state and causes it to shut down for a while, in order to begin the healing process.    Summary: 1. Refer to this scale when providing [place] with your pain level. 2. Be  accurate and careful when reporting your pain level. This will help with your care. 3. Over-reporting your pain level will lead to loss of credibility. 4. Even a level of 1/10 means that there is pain and will be treated at our facility. 5. High, inaccurate reporting will be documented as "Symptom Exaggeration", leading to loss of credibility and suspicions of possible secondary gains such as obtaining more narcotics, or wanting to appear disabled, for fraudulent reasons. 6. Only pain levels of 5 or below will be seen at our facility. 7. Pain levels of 6 and above will be sent to the Emergency Department and the appointment cancelled. ____________________________________________________________________________________________   Take calcium + vitamin D to avoid osteoporosis. A prescription for recommended over-the-counter medication was he scribed to your pharmacy.

## 2017-11-12 ENCOUNTER — Ambulatory Visit: Payer: Medicaid Other

## 2017-11-24 ENCOUNTER — Other Ambulatory Visit: Payer: Self-pay | Admitting: Internal Medicine

## 2017-11-24 DIAGNOSIS — R21 Rash and other nonspecific skin eruption: Secondary | ICD-10-CM

## 2017-12-03 ENCOUNTER — Ambulatory Visit: Payer: Medicaid Other

## 2017-12-15 ENCOUNTER — Ambulatory Visit: Payer: Medicaid Other | Admitting: Pharmacist

## 2017-12-19 ENCOUNTER — Ambulatory Visit: Payer: Medicaid Other | Admitting: Internal Medicine

## 2018-01-15 ENCOUNTER — Other Ambulatory Visit: Payer: Self-pay | Admitting: Internal Medicine

## 2018-01-15 DIAGNOSIS — R21 Rash and other nonspecific skin eruption: Secondary | ICD-10-CM

## 2018-02-12 ENCOUNTER — Telehealth: Payer: Self-pay | Admitting: *Deleted

## 2018-02-12 NOTE — Telephone Encounter (Signed)
Received Rx request for Spiriva Respimat 1.25 MCG INH, did not see this exact Rx on her Med list.  They are requesting Quantity- #4 with Refills-11, 30 day supply. Routing to PCP. Katharina Caper, April D, [name]

## 2018-02-13 ENCOUNTER — Ambulatory Visit: Payer: Medicaid Other

## 2018-02-18 ENCOUNTER — Other Ambulatory Visit: Payer: Self-pay

## 2018-02-18 ENCOUNTER — Ambulatory Visit (INDEPENDENT_AMBULATORY_CARE_PROVIDER_SITE_OTHER): Payer: Medicaid Other | Admitting: Internal Medicine

## 2018-02-18 ENCOUNTER — Encounter: Payer: Self-pay | Admitting: Internal Medicine

## 2018-02-18 ENCOUNTER — Ambulatory Visit: Payer: Medicaid Other

## 2018-02-18 VITALS — BP 150/100 | HR 81 | Temp 98.3°F | Ht 67.0 in | Wt 165.0 lb

## 2018-02-18 DIAGNOSIS — Z23 Encounter for immunization: Secondary | ICD-10-CM

## 2018-02-18 DIAGNOSIS — M19071 Primary osteoarthritis, right ankle and foot: Secondary | ICD-10-CM

## 2018-02-18 NOTE — Progress Notes (Signed)
   Subjective:    Rhonda Dominguez - 57 y.o. female MRN [id number]  Date of birth: 1961-11-23  HPI  Rhonda Dominguez is here for referral to foot doctor. Has known moderate to severe OA at the first metatarsal phalangeal joint of right foot. She wishes to see the podiatrist again. After discussing that I could place this referral, she abruptly stood up to leave.   -  reports that she has been smoking.  she has never used smokeless tobacco. - Review of Systems: Per HPI. - Past Medical History: Patient Active Problem List   Diagnosis Date Noted  . Elevated C-reactive protein (CRP) 11/10/2017  . Vitamin D deficiency 11/10/2017  . Complex regional pain syndrome type 1 of lower extremity (Right) 11/10/2017  . Cocaine abuse ([place]) 11/10/2017  . Problems influencing health status 11/10/2017  . Nicotine dependence 11/10/2017  . Osteoarthritis 11/10/2017  . NSAID long-term use 11/10/2017  . Neurogenic pain 11/10/2017  . Chronic shoulder pain (Bilateral) (L>R) 11/10/2017  . Dextroscoliosis 11/10/2017  . DDD (degenerative disc disease), cervical 11/10/2017  . Knee pain, chronic (Secondary Area of Pain) (right) 11/09/2017  . Opiate use 10/23/2017  . Great toe pain (Primary Area of Pain) (Right) 10/23/2017  . Chronic lower extremity pain [facility] Area of Pain) (Right) 10/23/2017  . Chronic pain syndrome 10/23/2017  . Disorder of skeletal system 10/23/2017  . Other reduced mobility 10/23/2017  . Urinary incontinence 11/20/2016  . Restrictive lung disease 07/23/2016  . Toe pain 04/13/2016  . Essential hypertension 03/22/2016  . Pharmacologic therapy 12/14/2015  . High risk bisexual behavior 05/18/2015  . CATARACTS, BILATERAL 06/24/2007  . Bipolar affective disorder ([place]) 10/03/2006  . CONSTIPATION 10/03/2006  . Eczema 10/03/2006   - Medications: reviewed and updated   Objective:   Physical Exam BP (!) 150/100   Pulse 81   Temp 98.3 F (36.8 C) (Oral)   Ht 5\' 7"  (1.702 m)   Wt 165  lb (74.8 kg)   SpO2 98%   BMI 25.84 kg/m  Gen: NAD, alert, well-appearing Refuses rest of physical exam.     Assessment & Plan:   1. Need for hepatitis B booster vaccination - Hepatitis B vaccine adult IM  2. Osteoarthritis of first metatarsophalangeal (MTP) joint of right foot Unable to examine patient or engage her to discuss other treatment options. Will refer to podiatry as patient has felt comfortable working with them in the past.  - Ambulatory referral to [place], D.O. 02/18/2018, 8:46 AM PGY-3, [place]

## 2018-02-27 ENCOUNTER — Telehealth: Payer: Self-pay

## 2018-02-27 NOTE — Telephone Encounter (Signed)
Pt left message on nurse line to call her back she "has a question". Attempted to return call, no answer and mailbox full. Wallace Cullens, RN

## 2018-03-02 ENCOUNTER — Ambulatory Visit: Payer: Medicaid Other | Admitting: Podiatry

## 2018-03-02 ENCOUNTER — Ambulatory Visit (INDEPENDENT_AMBULATORY_CARE_PROVIDER_SITE_OTHER): Payer: Medicaid Other

## 2018-03-02 ENCOUNTER — Telehealth: Payer: Self-pay | Admitting: Internal Medicine

## 2018-03-02 ENCOUNTER — Encounter: Payer: Self-pay | Admitting: Podiatry

## 2018-03-02 DIAGNOSIS — M7751 Other enthesopathy of right foot: Secondary | ICD-10-CM

## 2018-03-02 DIAGNOSIS — M778 Other enthesopathies, not elsewhere classified: Secondary | ICD-10-CM

## 2018-03-02 DIAGNOSIS — L600 Ingrowing nail: Secondary | ICD-10-CM

## 2018-03-02 DIAGNOSIS — M779 Enthesopathy, unspecified: Secondary | ICD-10-CM

## 2018-03-02 DIAGNOSIS — M205X1 Other deformities of toe(s) (acquired), right foot: Secondary | ICD-10-CM

## 2018-03-02 MED ORDER — TRIAMCINOLONE ACETONIDE 10 MG/ML IJ SUSP
10.0000 mg | Freq: Once | INTRAMUSCULAR | Status: AC
  Administered 2018-03-02: 10 mg

## 2018-03-02 NOTE — Telephone Encounter (Signed)
Pt called to let Juleen China know that she should be receiving paperwork to get orders to refill her pas and pampers.

## 2018-03-02 NOTE — Progress Notes (Signed)
Subjective:   Patient ID: Rhonda Dominguez, female   DOB: 57 y.o.   MRN: [id number]   HPI Patient presents stating for the last 3-4 weeks that she has developed a lot of pain underneath the right first metatarsal and also the second nail has been ingrown and bothersome for   ROS      Objective:  Physical Exam  Neurovascular status intact with acute inflammation plantar aspect right first MPJ with pain with palpation also noted to have a thickened incurvated second nail right that is moderately tender     Assessment:  Probability for plantar capsulitis or sesamoiditis right first MPJ with possibility for fracture but it does not appear to be that intense that that would be the problem along with ingrown toenail right     Plan:  Reviewed both conditions discussing treatment options and at this point I went ahead and I did a careful sesamoidal plantar capsule injection 3 mg dexamethasone Kenalog 5 mg Xylocaine applied dancers pad and then did sterile debridement of the ingrown toenail and discussed possibility for permanent procedure in the future with education given to her today

## 2018-03-02 NOTE — Telephone Encounter (Signed)
Pt has called multiple times today about her supplies.  She is upset because the orders have not been signed. They were received at 2 today.  Because she doesn't have her bed pads and pampers, she is in extreme pain with her toe and her blood pressure is rising. If she doesn't hear from dr Juleen China tonight or by tomorrow morning, she will be contacting dr Firefighter.

## 2018-03-03 NOTE — Telephone Encounter (Signed)
Contacted pt to give her the below information and pt stated that it had already been taken care of.  She said that she had called the supervisor and had it taken care of.  When I checked the doctors box the earlier the paper work was not present.  After pt said that she had already spoke to someone I went and asked Faythe Dingwall if she had spoken with pt.  She said that she did and that she had faxed the information earlier.  She had taken care of this earlier and had already removed the papers when I had checked doctor's box.  Pt stated that she appreciated all that we did. Routing to PCP as an Pharmacist, hospital.  Katharina Caper, Breckon Reeves D, [name]

## 2018-03-03 NOTE — Telephone Encounter (Signed)
Paperwork is not available for these orders. Spoke with April Zimmerman who will call patient to let her know she can have it re-sent or drop it off herself.   Phill Myron, D.O. 03/03/2018, 10:40 AM PGY-3, [place]

## 2018-03-05 ENCOUNTER — Telehealth: Payer: Self-pay

## 2018-03-05 NOTE — Telephone Encounter (Signed)
Received call from [facility] with [place]. Patient would like a Rollator walker and not a plain walker.  Please resend order for a Rollator 4 wheel walker with seat to [facility], fax [phone]. Danley Danker, RN [facility] RN)

## 2018-03-11 NOTE — Telephone Encounter (Signed)
Pt calling requesting this walker Rx be send to [facility] since the wrong type of walker was sent the first time.

## 2018-04-14 ENCOUNTER — Other Ambulatory Visit: Payer: Self-pay

## 2018-04-14 DIAGNOSIS — J42 Unspecified chronic bronchitis: Secondary | ICD-10-CM

## 2018-04-15 ENCOUNTER — Other Ambulatory Visit: Payer: Self-pay | Admitting: Internal Medicine

## 2018-04-15 DIAGNOSIS — R5381 Other malaise: Secondary | ICD-10-CM

## 2018-04-15 MED ORDER — ALBUTEROL SULFATE HFA 108 (90 BASE) MCG/ACT IN AERS: INHALATION_SPRAY | RESPIRATORY_TRACT | 3 refills | Status: DC

## 2018-04-15 NOTE — Progress Notes (Signed)
Order for 4 wheel rolling walker with seat placed.   Phill Myron, D.O. 04/15/2018, 1:46 PM PGY-3, [place]

## 2018-04-15 NOTE — Telephone Encounter (Signed)
Order has been completed and placed in fax pile.   Phill Myron, D.O. 04/15/2018, 1:47 PM PGY-3, [place]

## 2018-04-17 DIAGNOSIS — M19071 Primary osteoarthritis, right ankle and foot: Secondary | ICD-10-CM | POA: Diagnosis not present

## 2018-04-17 DIAGNOSIS — R2689 Other abnormalities of gait and mobility: Secondary | ICD-10-CM | POA: Diagnosis not present

## 2018-04-17 DIAGNOSIS — R32 Unspecified urinary incontinence: Secondary | ICD-10-CM | POA: Diagnosis not present

## 2018-04-17 DIAGNOSIS — R269 Unspecified abnormalities of gait and mobility: Secondary | ICD-10-CM | POA: Diagnosis not present

## 2018-04-17 DIAGNOSIS — M545 Low back pain: Secondary | ICD-10-CM | POA: Diagnosis not present

## 2018-05-01 ENCOUNTER — Other Ambulatory Visit (INDEPENDENT_AMBULATORY_CARE_PROVIDER_SITE_OTHER): Payer: Self-pay | Admitting: Orthopaedic Surgery

## 2018-05-19 ENCOUNTER — Other Ambulatory Visit: Payer: Self-pay | Admitting: Internal Medicine

## 2018-05-19 DIAGNOSIS — R21 Rash and other nonspecific skin eruption: Secondary | ICD-10-CM

## 2018-05-31 ENCOUNTER — Other Ambulatory Visit: Payer: Self-pay | Admitting: Internal Medicine

## 2018-05-31 DIAGNOSIS — R21 Rash and other nonspecific skin eruption: Secondary | ICD-10-CM

## 2018-06-07 DIAGNOSIS — E782 Mixed hyperlipidemia: Secondary | ICD-10-CM | POA: Diagnosis not present

## 2018-06-08 DIAGNOSIS — E782 Mixed hyperlipidemia: Secondary | ICD-10-CM | POA: Diagnosis not present

## 2018-06-09 DIAGNOSIS — E782 Mixed hyperlipidemia: Secondary | ICD-10-CM | POA: Diagnosis not present

## 2018-06-10 DIAGNOSIS — E782 Mixed hyperlipidemia: Secondary | ICD-10-CM | POA: Diagnosis not present

## 2018-06-11 DIAGNOSIS — E782 Mixed hyperlipidemia: Secondary | ICD-10-CM | POA: Diagnosis not present

## 2018-06-12 DIAGNOSIS — E782 Mixed hyperlipidemia: Secondary | ICD-10-CM | POA: Diagnosis not present

## 2018-06-13 DIAGNOSIS — E782 Mixed hyperlipidemia: Secondary | ICD-10-CM | POA: Diagnosis not present

## 2018-06-14 DIAGNOSIS — E782 Mixed hyperlipidemia: Secondary | ICD-10-CM | POA: Diagnosis not present

## 2018-06-15 DIAGNOSIS — E782 Mixed hyperlipidemia: Secondary | ICD-10-CM | POA: Diagnosis not present

## 2018-06-16 DIAGNOSIS — E782 Mixed hyperlipidemia: Secondary | ICD-10-CM | POA: Diagnosis not present

## 2018-06-17 DIAGNOSIS — E782 Mixed hyperlipidemia: Secondary | ICD-10-CM | POA: Diagnosis not present

## 2018-06-18 DIAGNOSIS — E782 Mixed hyperlipidemia: Secondary | ICD-10-CM | POA: Diagnosis not present

## 2018-06-19 DIAGNOSIS — E782 Mixed hyperlipidemia: Secondary | ICD-10-CM | POA: Diagnosis not present

## 2018-06-20 DIAGNOSIS — E782 Mixed hyperlipidemia: Secondary | ICD-10-CM | POA: Diagnosis not present

## 2018-06-21 DIAGNOSIS — E782 Mixed hyperlipidemia: Secondary | ICD-10-CM | POA: Diagnosis not present

## 2018-06-22 DIAGNOSIS — E782 Mixed hyperlipidemia: Secondary | ICD-10-CM | POA: Diagnosis not present

## 2018-06-23 DIAGNOSIS — E782 Mixed hyperlipidemia: Secondary | ICD-10-CM | POA: Diagnosis not present

## 2018-06-24 DIAGNOSIS — E782 Mixed hyperlipidemia: Secondary | ICD-10-CM | POA: Diagnosis not present

## 2018-06-25 DIAGNOSIS — E782 Mixed hyperlipidemia: Secondary | ICD-10-CM | POA: Diagnosis not present

## 2018-06-26 DIAGNOSIS — E782 Mixed hyperlipidemia: Secondary | ICD-10-CM | POA: Diagnosis not present

## 2018-06-27 DIAGNOSIS — E782 Mixed hyperlipidemia: Secondary | ICD-10-CM | POA: Diagnosis not present

## 2018-06-28 DIAGNOSIS — E782 Mixed hyperlipidemia: Secondary | ICD-10-CM | POA: Diagnosis not present

## 2018-06-29 DIAGNOSIS — E782 Mixed hyperlipidemia: Secondary | ICD-10-CM | POA: Diagnosis not present

## 2018-06-30 DIAGNOSIS — E782 Mixed hyperlipidemia: Secondary | ICD-10-CM | POA: Diagnosis not present

## 2018-07-01 DIAGNOSIS — E782 Mixed hyperlipidemia: Secondary | ICD-10-CM | POA: Diagnosis not present

## 2018-07-02 DIAGNOSIS — E782 Mixed hyperlipidemia: Secondary | ICD-10-CM | POA: Diagnosis not present

## 2018-07-03 DIAGNOSIS — E782 Mixed hyperlipidemia: Secondary | ICD-10-CM | POA: Diagnosis not present

## 2018-07-04 DIAGNOSIS — E782 Mixed hyperlipidemia: Secondary | ICD-10-CM | POA: Diagnosis not present

## 2018-07-05 DIAGNOSIS — E782 Mixed hyperlipidemia: Secondary | ICD-10-CM | POA: Diagnosis not present

## 2018-07-06 DIAGNOSIS — E782 Mixed hyperlipidemia: Secondary | ICD-10-CM | POA: Diagnosis not present

## 2018-07-07 DIAGNOSIS — E782 Mixed hyperlipidemia: Secondary | ICD-10-CM | POA: Diagnosis not present

## 2018-07-08 DIAGNOSIS — E782 Mixed hyperlipidemia: Secondary | ICD-10-CM | POA: Diagnosis not present

## 2018-07-09 ENCOUNTER — Ambulatory Visit: Payer: Medicaid Other | Admitting: Pharmacist

## 2018-07-09 DIAGNOSIS — E782 Mixed hyperlipidemia: Secondary | ICD-10-CM | POA: Diagnosis not present

## 2018-07-10 DIAGNOSIS — E782 Mixed hyperlipidemia: Secondary | ICD-10-CM | POA: Diagnosis not present

## 2018-07-11 DIAGNOSIS — E782 Mixed hyperlipidemia: Secondary | ICD-10-CM | POA: Diagnosis not present

## 2018-07-12 DIAGNOSIS — E782 Mixed hyperlipidemia: Secondary | ICD-10-CM | POA: Diagnosis not present

## 2018-07-13 DIAGNOSIS — E782 Mixed hyperlipidemia: Secondary | ICD-10-CM | POA: Diagnosis not present

## 2018-07-14 DIAGNOSIS — E782 Mixed hyperlipidemia: Secondary | ICD-10-CM | POA: Diagnosis not present

## 2018-07-15 DIAGNOSIS — E782 Mixed hyperlipidemia: Secondary | ICD-10-CM | POA: Diagnosis not present

## 2018-07-16 DIAGNOSIS — E782 Mixed hyperlipidemia: Secondary | ICD-10-CM | POA: Diagnosis not present

## 2018-07-17 DIAGNOSIS — E782 Mixed hyperlipidemia: Secondary | ICD-10-CM | POA: Diagnosis not present

## 2018-07-18 DIAGNOSIS — E782 Mixed hyperlipidemia: Secondary | ICD-10-CM | POA: Diagnosis not present

## 2018-07-19 DIAGNOSIS — E782 Mixed hyperlipidemia: Secondary | ICD-10-CM | POA: Diagnosis not present

## 2018-07-20 DIAGNOSIS — E782 Mixed hyperlipidemia: Secondary | ICD-10-CM | POA: Diagnosis not present

## 2018-07-21 ENCOUNTER — Other Ambulatory Visit (INDEPENDENT_AMBULATORY_CARE_PROVIDER_SITE_OTHER): Payer: Self-pay | Admitting: Orthopaedic Surgery

## 2018-07-21 ENCOUNTER — Other Ambulatory Visit: Payer: Self-pay | Admitting: Pain Medicine

## 2018-07-21 ENCOUNTER — Other Ambulatory Visit: Payer: Self-pay

## 2018-07-21 DIAGNOSIS — E782 Mixed hyperlipidemia: Secondary | ICD-10-CM | POA: Diagnosis not present

## 2018-07-21 DIAGNOSIS — R21 Rash and other nonspecific skin eruption: Secondary | ICD-10-CM

## 2018-07-22 ENCOUNTER — Other Ambulatory Visit: Payer: Self-pay | Admitting: Family Medicine

## 2018-07-22 DIAGNOSIS — Z1231 Encounter for screening mammogram for malignant neoplasm of breast: Secondary | ICD-10-CM

## 2018-07-22 DIAGNOSIS — E782 Mixed hyperlipidemia: Secondary | ICD-10-CM | POA: Diagnosis not present

## 2018-07-22 MED ORDER — HYDROXYZINE HCL 25 MG PO TABS: 25.0000 mg | ORAL_TABLET | Freq: Three times a day (TID) | ORAL | 0 refills | Status: DC | PRN

## 2018-07-23 DIAGNOSIS — E782 Mixed hyperlipidemia: Secondary | ICD-10-CM | POA: Diagnosis not present

## 2018-07-24 DIAGNOSIS — E782 Mixed hyperlipidemia: Secondary | ICD-10-CM | POA: Diagnosis not present

## 2018-07-25 DIAGNOSIS — E782 Mixed hyperlipidemia: Secondary | ICD-10-CM | POA: Diagnosis not present

## 2018-07-26 DIAGNOSIS — E782 Mixed hyperlipidemia: Secondary | ICD-10-CM | POA: Diagnosis not present

## 2018-07-27 DIAGNOSIS — E782 Mixed hyperlipidemia: Secondary | ICD-10-CM | POA: Diagnosis not present

## 2018-07-28 DIAGNOSIS — E782 Mixed hyperlipidemia: Secondary | ICD-10-CM | POA: Diagnosis not present

## 2018-07-29 DIAGNOSIS — E782 Mixed hyperlipidemia: Secondary | ICD-10-CM | POA: Diagnosis not present

## 2018-07-30 DIAGNOSIS — E782 Mixed hyperlipidemia: Secondary | ICD-10-CM | POA: Diagnosis not present

## 2018-07-31 DIAGNOSIS — E782 Mixed hyperlipidemia: Secondary | ICD-10-CM | POA: Diagnosis not present

## 2018-08-01 DIAGNOSIS — E782 Mixed hyperlipidemia: Secondary | ICD-10-CM | POA: Diagnosis not present

## 2018-08-02 DIAGNOSIS — E782 Mixed hyperlipidemia: Secondary | ICD-10-CM | POA: Diagnosis not present

## 2018-08-03 DIAGNOSIS — E782 Mixed hyperlipidemia: Secondary | ICD-10-CM | POA: Diagnosis not present

## 2018-08-04 DIAGNOSIS — E782 Mixed hyperlipidemia: Secondary | ICD-10-CM | POA: Diagnosis not present

## 2018-08-05 DIAGNOSIS — E782 Mixed hyperlipidemia: Secondary | ICD-10-CM | POA: Diagnosis not present

## 2018-08-06 DIAGNOSIS — E782 Mixed hyperlipidemia: Secondary | ICD-10-CM | POA: Diagnosis not present

## 2018-08-07 DIAGNOSIS — E782 Mixed hyperlipidemia: Secondary | ICD-10-CM | POA: Diagnosis not present

## 2018-08-08 DIAGNOSIS — E782 Mixed hyperlipidemia: Secondary | ICD-10-CM | POA: Diagnosis not present

## 2018-08-09 DIAGNOSIS — E782 Mixed hyperlipidemia: Secondary | ICD-10-CM | POA: Diagnosis not present

## 2018-08-10 DIAGNOSIS — E782 Mixed hyperlipidemia: Secondary | ICD-10-CM | POA: Diagnosis not present

## 2018-08-11 DIAGNOSIS — E782 Mixed hyperlipidemia: Secondary | ICD-10-CM | POA: Diagnosis not present

## 2018-08-12 ENCOUNTER — Ambulatory Visit: Payer: Medicaid Other | Admitting: Family Medicine

## 2018-08-12 DIAGNOSIS — E782 Mixed hyperlipidemia: Secondary | ICD-10-CM | POA: Diagnosis not present

## 2018-08-13 DIAGNOSIS — E782 Mixed hyperlipidemia: Secondary | ICD-10-CM | POA: Diagnosis not present

## 2018-08-14 DIAGNOSIS — E782 Mixed hyperlipidemia: Secondary | ICD-10-CM | POA: Diagnosis not present

## 2018-08-15 DIAGNOSIS — E782 Mixed hyperlipidemia: Secondary | ICD-10-CM | POA: Diagnosis not present

## 2018-08-16 DIAGNOSIS — E782 Mixed hyperlipidemia: Secondary | ICD-10-CM | POA: Diagnosis not present

## 2018-08-17 DIAGNOSIS — E782 Mixed hyperlipidemia: Secondary | ICD-10-CM | POA: Diagnosis not present

## 2018-08-18 DIAGNOSIS — E782 Mixed hyperlipidemia: Secondary | ICD-10-CM | POA: Diagnosis not present

## 2018-08-19 DIAGNOSIS — E782 Mixed hyperlipidemia: Secondary | ICD-10-CM | POA: Diagnosis not present

## 2018-08-20 DIAGNOSIS — E782 Mixed hyperlipidemia: Secondary | ICD-10-CM | POA: Diagnosis not present

## 2018-08-21 DIAGNOSIS — E782 Mixed hyperlipidemia: Secondary | ICD-10-CM | POA: Diagnosis not present

## 2018-08-22 DIAGNOSIS — E782 Mixed hyperlipidemia: Secondary | ICD-10-CM | POA: Diagnosis not present

## 2018-08-23 DIAGNOSIS — E782 Mixed hyperlipidemia: Secondary | ICD-10-CM | POA: Diagnosis not present

## 2018-08-24 ENCOUNTER — Emergency Department (HOSPITAL_COMMUNITY)
Admission: EM | Admit: 2018-08-24 | Discharge: 2018-08-24 | Discharge status: Against Medical Advice | Payer: Medicaid Other

## 2018-08-24 ENCOUNTER — Other Ambulatory Visit: Payer: Self-pay

## 2018-08-24 DIAGNOSIS — E782 Mixed hyperlipidemia: Secondary | ICD-10-CM | POA: Diagnosis not present

## 2018-08-25 ENCOUNTER — Other Ambulatory Visit: Payer: Self-pay | Admitting: Family Medicine

## 2018-08-25 DIAGNOSIS — E782 Mixed hyperlipidemia: Secondary | ICD-10-CM | POA: Diagnosis not present

## 2018-08-25 DIAGNOSIS — R21 Rash and other nonspecific skin eruption: Secondary | ICD-10-CM

## 2018-08-26 DIAGNOSIS — E782 Mixed hyperlipidemia: Secondary | ICD-10-CM | POA: Diagnosis not present

## 2018-08-26 MED ORDER — TRIAMCINOLONE ACETONIDE 0.025 % EX OINT: TOPICAL_OINTMENT | CUTANEOUS | 0 refills | Status: DC

## 2018-08-26 NOTE — Telephone Encounter (Signed)
Will refill medications requestion, but patient overdue for appt with me (we have not met yet). Please call and have patient seen as soon as they can for meet new PCP and med review follow up.

## 2018-08-27 DIAGNOSIS — E782 Mixed hyperlipidemia: Secondary | ICD-10-CM | POA: Diagnosis not present

## 2018-08-28 DIAGNOSIS — E782 Mixed hyperlipidemia: Secondary | ICD-10-CM | POA: Diagnosis not present

## 2018-08-29 DIAGNOSIS — E782 Mixed hyperlipidemia: Secondary | ICD-10-CM | POA: Diagnosis not present

## 2018-08-30 DIAGNOSIS — E782 Mixed hyperlipidemia: Secondary | ICD-10-CM | POA: Diagnosis not present

## 2018-08-31 DIAGNOSIS — E782 Mixed hyperlipidemia: Secondary | ICD-10-CM | POA: Diagnosis not present

## 2018-09-01 ENCOUNTER — Other Ambulatory Visit: Payer: Self-pay | Admitting: Family Medicine

## 2018-09-01 DIAGNOSIS — I10 Essential (primary) hypertension: Secondary | ICD-10-CM

## 2018-09-01 DIAGNOSIS — E782 Mixed hyperlipidemia: Secondary | ICD-10-CM | POA: Diagnosis not present

## 2018-09-01 NOTE — Telephone Encounter (Signed)
Please call patient, she was due for appt and missed it recently. Will provide one more month of BP med.

## 2018-09-02 DIAGNOSIS — E782 Mixed hyperlipidemia: Secondary | ICD-10-CM | POA: Diagnosis not present

## 2018-09-02 MED ORDER — CALCIUM PLUS D3 ABSORBABLE 600-2500 MG-UNIT PO CAPS: 1.0000 | ORAL_CAPSULE | Freq: Every day | ORAL | 0 refills | Status: DC

## 2018-09-02 NOTE — Telephone Encounter (Signed)
Made appt for 09/28/18.  She request to have the Calcium refilled as well. [place], Salome Spotted, CMA

## 2018-09-03 DIAGNOSIS — E782 Mixed hyperlipidemia: Secondary | ICD-10-CM | POA: Diagnosis not present

## 2018-09-04 DIAGNOSIS — E782 Mixed hyperlipidemia: Secondary | ICD-10-CM | POA: Diagnosis not present

## 2018-09-05 DIAGNOSIS — E782 Mixed hyperlipidemia: Secondary | ICD-10-CM | POA: Diagnosis not present

## 2018-09-06 DIAGNOSIS — E782 Mixed hyperlipidemia: Secondary | ICD-10-CM | POA: Diagnosis not present

## 2018-09-07 DIAGNOSIS — E782 Mixed hyperlipidemia: Secondary | ICD-10-CM | POA: Diagnosis not present

## 2018-09-08 DIAGNOSIS — E782 Mixed hyperlipidemia: Secondary | ICD-10-CM | POA: Diagnosis not present

## 2018-09-09 DIAGNOSIS — E782 Mixed hyperlipidemia: Secondary | ICD-10-CM | POA: Diagnosis not present

## 2018-09-10 DIAGNOSIS — E782 Mixed hyperlipidemia: Secondary | ICD-10-CM | POA: Diagnosis not present

## 2018-09-11 DIAGNOSIS — E782 Mixed hyperlipidemia: Secondary | ICD-10-CM | POA: Diagnosis not present

## 2018-09-12 DIAGNOSIS — E782 Mixed hyperlipidemia: Secondary | ICD-10-CM | POA: Diagnosis not present

## 2018-09-13 DIAGNOSIS — E782 Mixed hyperlipidemia: Secondary | ICD-10-CM | POA: Diagnosis not present

## 2018-09-14 DIAGNOSIS — E782 Mixed hyperlipidemia: Secondary | ICD-10-CM | POA: Diagnosis not present

## 2018-09-15 DIAGNOSIS — E782 Mixed hyperlipidemia: Secondary | ICD-10-CM | POA: Diagnosis not present

## 2018-09-16 DIAGNOSIS — E782 Mixed hyperlipidemia: Secondary | ICD-10-CM | POA: Diagnosis not present

## 2018-09-17 DIAGNOSIS — E782 Mixed hyperlipidemia: Secondary | ICD-10-CM | POA: Diagnosis not present

## 2018-09-18 DIAGNOSIS — E782 Mixed hyperlipidemia: Secondary | ICD-10-CM | POA: Diagnosis not present

## 2018-09-19 DIAGNOSIS — Z23 Encounter for immunization: Secondary | ICD-10-CM | POA: Diagnosis not present

## 2018-09-19 DIAGNOSIS — E782 Mixed hyperlipidemia: Secondary | ICD-10-CM | POA: Diagnosis not present

## 2018-09-20 DIAGNOSIS — E782 Mixed hyperlipidemia: Secondary | ICD-10-CM | POA: Diagnosis not present

## 2018-09-21 DIAGNOSIS — E782 Mixed hyperlipidemia: Secondary | ICD-10-CM | POA: Diagnosis not present

## 2018-09-22 DIAGNOSIS — E782 Mixed hyperlipidemia: Secondary | ICD-10-CM | POA: Diagnosis not present

## 2018-09-23 DIAGNOSIS — E782 Mixed hyperlipidemia: Secondary | ICD-10-CM | POA: Diagnosis not present

## 2018-09-24 DIAGNOSIS — E782 Mixed hyperlipidemia: Secondary | ICD-10-CM | POA: Diagnosis not present

## 2018-09-24 DIAGNOSIS — R32 Unspecified urinary incontinence: Secondary | ICD-10-CM | POA: Diagnosis not present

## 2018-09-25 DIAGNOSIS — E782 Mixed hyperlipidemia: Secondary | ICD-10-CM | POA: Diagnosis not present

## 2018-09-26 DIAGNOSIS — E782 Mixed hyperlipidemia: Secondary | ICD-10-CM | POA: Diagnosis not present

## 2018-09-27 DIAGNOSIS — E782 Mixed hyperlipidemia: Secondary | ICD-10-CM | POA: Diagnosis not present

## 2018-09-28 ENCOUNTER — Ambulatory Visit: Payer: Medicaid Other | Admitting: Family Medicine

## 2018-09-28 DIAGNOSIS — E782 Mixed hyperlipidemia: Secondary | ICD-10-CM | POA: Diagnosis not present

## 2018-09-29 ENCOUNTER — Telehealth: Payer: Self-pay | Admitting: Family Medicine

## 2018-09-29 ENCOUNTER — Telehealth: Payer: Self-pay | Admitting: *Deleted

## 2018-09-29 DIAGNOSIS — E782 Mixed hyperlipidemia: Secondary | ICD-10-CM | POA: Diagnosis not present

## 2018-09-29 NOTE — Telephone Encounter (Signed)
Pt states that the calcium and vit d are going to cost her $7 and she cant afford that.   She states the last time she was given Vit D it was only $3.  Advised that since that med is OTC that could be the price difference.  She is not satisfied with this answered and demands that "do my job" and call the pharmacy to see "what the issue is".  Advised that I would be happy to do that but I would not tolerate her speaking to me like that.    At this point she is more appropriate. I advised I would call the pharmacy but I was not sure there was anything to be done.   Spoke with pharmacy.  What I expected was true.  Medicaid will not pay OTC.  They will cover the D3 @ 50,000 units.   Will forward to MD. Stefan Markarian, Salome Spotted, [place]

## 2018-09-30 DIAGNOSIS — E782 Mixed hyperlipidemia: Secondary | ICD-10-CM | POA: Diagnosis not present

## 2018-10-01 DIAGNOSIS — E782 Mixed hyperlipidemia: Secondary | ICD-10-CM | POA: Diagnosis not present

## 2018-10-01 NOTE — Progress Notes (Deleted)
  Subjective:   Patient ID: Rhonda Dominguez    DOB: 01/21/1961, 57 y.o. female   MRN: [id number]  Rhonda Dominguez is a 57 y.o. female with a history of HTN, RLD, OA, DDD, eczema, bipolar affective d/o, polysubstance use here for   Knots on breast - ***  Review of Systems:  Per HPI.  [place], medications and smoking status reviewed.  Objective:   There were no vitals taken for this visit. Vitals and nursing note reviewed.  General: well nourished, well developed, in no acute distress with non-toxic appearance HEENT: normocephalic, atraumatic, moist mucous membranes Neck: supple, non-tender without lymphadenopathy CV: regular rate and rhythm without murmurs, rubs, or gallops, no lower extremity edema Lungs: clear to auscultation bilaterally with normal work of breathing Abdomen: soft, non-tender, non-distended, no masses or organomegaly palpable, normoactive bowel sounds Skin: warm, dry, no rashes or lesions Extremities: warm and well perfused, normal tone MSK: ROM grossly intact, strength intact, gait normal Neuro: Alert and oriented, speech normal  Assessment & Plan:   No problem-specific Assessment & Plan notes found for this encounter.  No orders of the defined types were placed in this encounter.  No orders of the defined types were placed in this encounter.   Rory Percy, DO PGY-2, [facility] 10/01/2018 7:49 PM

## 2018-10-01 NOTE — Telephone Encounter (Signed)
Pt is calling and would like to clarify that she needs the "vitiamin D2" this is what medicaid will cover. She says it has to be sent to [facility] on [address]. She said she would like it done today because she will have a ride.

## 2018-10-01 NOTE — Telephone Encounter (Signed)
Pt calls back.  She wants the D2 called in.  Will forward to MD to fill.  During convo, she says that she has a knot in her breast and it is painful.  ATC appt for tomorrow @ 1:30. [employer], Salome Spotted, CMA

## 2018-10-02 ENCOUNTER — Ambulatory Visit: Payer: Medicaid Other

## 2018-10-02 DIAGNOSIS — E782 Mixed hyperlipidemia: Secondary | ICD-10-CM | POA: Diagnosis not present

## 2018-10-02 NOTE — Telephone Encounter (Signed)
LVM for pt to call. If pt calls, please give her the info below. Ottis Stain, CMA

## 2018-10-02 NOTE — Telephone Encounter (Signed)
Please let patient know I have reviewed her labs. Her vitamin D was low almost one year ago and should be rechecked before restarting rx vitamin D therapy. She should keep her appointment with me on 10/13/18 and we can discuss it then. It will not harm her to not have vitamin D medication for the next week or so before her appointment.

## 2018-10-03 DIAGNOSIS — E782 Mixed hyperlipidemia: Secondary | ICD-10-CM | POA: Diagnosis not present

## 2018-10-04 DIAGNOSIS — E782 Mixed hyperlipidemia: Secondary | ICD-10-CM | POA: Diagnosis not present

## 2018-10-05 DIAGNOSIS — E782 Mixed hyperlipidemia: Secondary | ICD-10-CM | POA: Diagnosis not present

## 2018-10-06 DIAGNOSIS — E782 Mixed hyperlipidemia: Secondary | ICD-10-CM | POA: Diagnosis not present

## 2018-10-06 NOTE — Telephone Encounter (Signed)
Pt is returning April's phone call and would like for her to call her back.

## 2018-10-06 NOTE — Telephone Encounter (Signed)
Pt informed of below. Zimmerman Rumple, Emonee Winkowski D, CMA  

## 2018-10-06 NOTE — Telephone Encounter (Signed)
This has been handled in another phone message. Rhonda Dominguez, April D, [name]

## 2018-10-06 NOTE — Telephone Encounter (Signed)
Pt is calling to return April's phone call. Pt would like for her to call her back.

## 2018-10-06 NOTE — Telephone Encounter (Signed)
LVM for pt to call back to inform her of below. Zimmerman Rumple, Yecenia Dalgleish D, CMA  

## 2018-10-07 DIAGNOSIS — E782 Mixed hyperlipidemia: Secondary | ICD-10-CM | POA: Diagnosis not present

## 2018-10-08 DIAGNOSIS — E782 Mixed hyperlipidemia: Secondary | ICD-10-CM | POA: Diagnosis not present

## 2018-10-09 DIAGNOSIS — E782 Mixed hyperlipidemia: Secondary | ICD-10-CM | POA: Diagnosis not present

## 2018-10-10 DIAGNOSIS — E782 Mixed hyperlipidemia: Secondary | ICD-10-CM | POA: Diagnosis not present

## 2018-10-11 DIAGNOSIS — E782 Mixed hyperlipidemia: Secondary | ICD-10-CM | POA: Diagnosis not present

## 2018-10-12 DIAGNOSIS — E782 Mixed hyperlipidemia: Secondary | ICD-10-CM | POA: Diagnosis not present

## 2018-10-13 ENCOUNTER — Other Ambulatory Visit: Payer: Self-pay

## 2018-10-13 ENCOUNTER — Other Ambulatory Visit (HOSPITAL_COMMUNITY)
Admission: RE | Admit: 2018-10-13 | Discharge: 2018-10-13 | Disposition: A | Discharge status: Home / Self Care | Payer: Medicaid Other | Source: Ambulatory Visit | Attending: Family Medicine | Admitting: Family Medicine

## 2018-10-13 ENCOUNTER — Encounter: Payer: Self-pay | Admitting: Family Medicine

## 2018-10-13 ENCOUNTER — Ambulatory Visit (INDEPENDENT_AMBULATORY_CARE_PROVIDER_SITE_OTHER): Payer: Medicaid Other | Admitting: Family Medicine

## 2018-10-13 VITALS — BP 136/90 | HR 78 | Temp 98.3°F | Ht 67.0 in | Wt 155.0 lb

## 2018-10-13 DIAGNOSIS — E782 Mixed hyperlipidemia: Secondary | ICD-10-CM | POA: Diagnosis not present

## 2018-10-13 DIAGNOSIS — E559 Vitamin D deficiency, unspecified: Secondary | ICD-10-CM | POA: Diagnosis not present

## 2018-10-13 DIAGNOSIS — F3112 Bipolar disorder, current episode manic without psychotic features, moderate: Secondary | ICD-10-CM | POA: Diagnosis not present

## 2018-10-13 DIAGNOSIS — Z7253 High risk bisexual behavior: Secondary | ICD-10-CM

## 2018-10-13 DIAGNOSIS — I10 Essential (primary) hypertension: Secondary | ICD-10-CM | POA: Diagnosis not present

## 2018-10-13 NOTE — Patient Instructions (Signed)
We will call you if your labs are abnormal. Please follow up with the dermatology clinic.

## 2018-10-13 NOTE — Progress Notes (Signed)
CC: skin lesion, "wants labs"  HPI  Nighttime incontinence -patient endorses some incontinence although is very tangential with her history.  Denies fever, denies dysuria.  She feels that this is related to her kidneys in someway which she cannot elaborate on.  Keeps repeating "You have to check my kidneys."   Jill Alexanders [phone] is the [facility] person she is working with - has bedside commode, incontinence supplies, and pads, and rolling walker.    Had recent risky sexual encounter several times over the last month. No vaginal discharge. No dysuria. No fever. Wants to do a urine for G/c.  Declines mammograms,  Didn't get hepatitis B vaccine in the past as Dr. Juleen China had recommended  She wants to get a life alert.  I explained that we do not order this for her.  Bipolar - seeing [facility].  Denies SI or HI today.  Extremely tangential in her conversation, unable to complete a full thought.  Vitamin D - previously on 2000 IU daily, had trouble with affordability.  No known hx of fracture. FRAX 5.6% 10 year major fracture risk currently.  Patient was supplemented by Marnette Burgess, NP in the past.  States she will not pay for over-the-counter vitamin D and wants me to prescribe something that Medicaid will cover.  She notes itching lesions on her legs, cannot tell me how long they have been there. Thought they were scabies.   Wt Readings from Last 3 Encounters:  10/13/18 155 lb (70.3 kg)  02/18/18 165 lb (74.8 kg)  11/10/17 159 lb (72.1 kg)  She also states that she has been losing weight recently and that her former weight was 255 pounds.  Showed her above readings.  ROS: Denies CP, SOB, abdominal pain, dysuria, changes in BMs.   CC, SH/smoking status, and VS noted  Objective: BP 136/90 (BP Location: Right Arm, Patient Position: Sitting, Cuff Size: Normal)   Pulse 78   Temp 98.3 F (36.8 C) (Oral)   Ht 5\' 7"  (1.702 m)   Wt 155 lb (70.3 kg)   SpO2 97%   BMI 24.28 kg/m  Gen:  NAD, alert, cooperative, and pleasant. HEENT: NCAT, EOMI, PERRL CV: RRR, no murmur Resp: CTAB, no wheezes, non-labored Ext: No edema, warm Neuro: Alert and oriented, Speech clear, No gross deficits     Assessment and plan:  Bipolar affective disorder (HCC) Patient on the manic side today, quite tangential and with pressured speech. This inhibited much of our care, and I recommended that she follow up urgently with psych.   Vitamin D deficiency Patient emphatic that she needs vitamin D as rx'd by another office in the past. Counseled patient that continuous supplementation is not without risk in otherwise healthy adults. She is amenable to stopping. No hx of osteoporosis. FRAX as above.  High risk bisexual behavior Denies vaginal discharge, declines vaginal exam. Demands STD testing, will perform. Of note, lab came to me after patient had left stating the the "urine sample" was ice cold and quite dilute in appearance, suggesting possible factitious sample such as water from sink? Thus not sent for sample as inadequate.   Essential hypertension BP up today, patient states she did not take norvasc. Encouraged her to take this daily or consider pill box.   Skin lesions - patient demands derm, recommended derm clinic at [facility]. Patient will schedule appt. Question insect bites with behavioral persistent scratching - have had biopsies result in prurigo nodularis in similar patients also with psych hx as persistent  scratching. Patient is so tangential that I can't get a great hx of the onset of these lesions.   Orders Placed This Encounter  Procedures  . CBC  . Basic metabolic panel  . HIV Antibody (routine testing w rflx)  . RPR    No orders of the defined types were placed in this encounter.   Ralene Ok, MD, PGY3 10/15/2018 9:04 AM

## 2018-10-14 ENCOUNTER — Other Ambulatory Visit: Payer: Self-pay | Admitting: Family Medicine

## 2018-10-14 DIAGNOSIS — R21 Rash and other nonspecific skin eruption: Secondary | ICD-10-CM

## 2018-10-14 DIAGNOSIS — E782 Mixed hyperlipidemia: Secondary | ICD-10-CM | POA: Diagnosis not present

## 2018-10-14 LAB — RPR: RPR Ser Ql: NONREACTIVE

## 2018-10-14 LAB — HIV ANTIBODY (ROUTINE TESTING W REFLEX): HIV Screen 4th Generation wRfx: NONREACTIVE

## 2018-10-14 LAB — CBC
HEMATOCRIT: 38.5 % (ref 34.0–46.6)
Hemoglobin: 11.9 g/dL (ref 11.1–15.9)
MCH: 25.6 pg — ABNORMAL LOW (ref 26.6–33.0)
MCHC: 30.9 g/dL — ABNORMAL LOW (ref 31.5–35.7)
MCV: 83 fL (ref 79–97)
PLATELETS: 283 10*3/uL (ref 150–450)
RBC: 4.65 x10E6/uL (ref 3.77–5.28)
RDW: 14.7 % (ref 12.3–15.4)
WBC: 8.2 10*3/uL (ref 3.4–10.8)

## 2018-10-14 LAB — BASIC METABOLIC PANEL
BUN/Creatinine Ratio: 15 (ref 9–23)
BUN: 15 mg/dL (ref 6–24)
CALCIUM: 10.1 mg/dL (ref 8.7–10.2)
CHLORIDE: 105 mmol/L (ref 96–106)
CO2: 25 mmol/L (ref 20–29)
Creatinine, Ser: 1 mg/dL (ref 0.57–1.00)
GFR, EST AFRICAN AMERICAN: 72 mL/min/{1.73_m2} (ref >59)
GFR, EST NON AFRICAN AMERICAN: 62 mL/min/{1.73_m2} (ref >59)
Glucose: 81 mg/dL (ref 65–99)
POTASSIUM: 3.7 mmol/L (ref 3.5–5.2)
SODIUM: 145 mmol/L — AB (ref 134–144)

## 2018-10-15 ENCOUNTER — Ambulatory Visit: Payer: Medicaid Other | Admitting: Pharmacist

## 2018-10-15 ENCOUNTER — Ambulatory Visit (INDEPENDENT_AMBULATORY_CARE_PROVIDER_SITE_OTHER): Payer: Medicaid Other | Admitting: Family Medicine

## 2018-10-15 ENCOUNTER — Telehealth: Payer: Self-pay | Admitting: Family Medicine

## 2018-10-15 ENCOUNTER — Other Ambulatory Visit: Payer: Self-pay

## 2018-10-15 VITALS — BP 132/54 | HR 110 | Temp 98.5°F | Wt 152.0 lb

## 2018-10-15 DIAGNOSIS — N6320 Unspecified lump in the left breast, unspecified quadrant: Principal | ICD-10-CM

## 2018-10-15 DIAGNOSIS — R21 Rash and other nonspecific skin eruption: Secondary | ICD-10-CM | POA: Diagnosis not present

## 2018-10-15 DIAGNOSIS — Z1239 Encounter for other screening for malignant neoplasm of breast: Secondary | ICD-10-CM

## 2018-10-15 DIAGNOSIS — N6310 Unspecified lump in the right breast, unspecified quadrant: Secondary | ICD-10-CM

## 2018-10-15 DIAGNOSIS — E782 Mixed hyperlipidemia: Secondary | ICD-10-CM | POA: Diagnosis not present

## 2018-10-15 LAB — URINE CYTOLOGY ANCILLARY ONLY
Chlamydia: NEGATIVE
NEISSERIA GONORRHEA: NEGATIVE

## 2018-10-15 MED ORDER — TRIAMCINOLONE ACETONIDE 0.025 % EX OINT: 1.0000 "application " | TOPICAL_OINTMENT | Freq: Two times a day (BID) | CUTANEOUS | 0 refills | Status: DC

## 2018-10-15 NOTE — Assessment & Plan Note (Addendum)
Denies vaginal discharge, declines vaginal exam. Demands STD testing, will perform. Of note, lab came to me after patient had left stating the the "urine sample" was ice cold and quite dilute in appearance, suggesting possible factitious sample such as water from sink? Thus not sent for sample as inadequate.

## 2018-10-15 NOTE — Assessment & Plan Note (Addendum)
Patient emphatic that she needs vitamin D as rx'd by another office in the past. Counseled patient that continuous supplementation is not without risk in otherwise healthy adults. She is amenable to stopping. No hx of osteoporosis. FRAX as above.

## 2018-10-15 NOTE — Assessment & Plan Note (Signed)
Patient on the manic side today, quite tangential and with pressured speech. This inhibited much of our care, and I recommended that she follow up urgently with psych.

## 2018-10-15 NOTE — Patient Instructions (Signed)
It was a pleasure seeing you today in our clinic.  Here is the treatment plan we have discussed and agreed upon together:  - Use the ointment twice daily for two weeks - Try not to scratch! Use your medication and Vaseline to keep from scratching! - Follow up with your regular doctor if your symptoms do not improve   Our clinic's number is [phone]. Please call with questions or concerns about what we discussed today.  Be well, Dr. Burr Medico

## 2018-10-15 NOTE — Telephone Encounter (Signed)
Called patient to discuss normal STD screening labs today. She says that she has a "knot in her titty" repeatedly and that someone sent me a message about needing an order for the breast center. I have not seen any messages about this. She did not bring this up at our appointment (see note, she was quite tangential), thus I have not examined it. I explained that it looks like imaging was BIRADS 1 in 08/2017, but patient states the lump has changed. I will place breast center orders, hopefully they can help [place] with this.

## 2018-10-15 NOTE — Assessment & Plan Note (Signed)
BP up today, patient states she did not take norvasc. Encouraged her to take this daily or consider pill box.

## 2018-10-15 NOTE — Progress Notes (Addendum)
S 

## 2018-10-16 DIAGNOSIS — R21 Rash and other nonspecific skin eruption: Secondary | ICD-10-CM | POA: Insufficient documentation

## 2018-10-16 DIAGNOSIS — L989 Disorder of the skin and subcutaneous tissue, unspecified: Secondary | ICD-10-CM | POA: Insufficient documentation

## 2018-10-16 DIAGNOSIS — E782 Mixed hyperlipidemia: Secondary | ICD-10-CM | POA: Diagnosis not present

## 2018-10-16 NOTE — Progress Notes (Addendum)
   CC: pruritic rash  HPI: Rhonda Dominguez is a 57 y.o. female who presents with a pruritic rash to [facility] dermatology clinic.  Patient notes a pruritic rash over bilateral shins. She reports it has been present for about two weeks. She denies any exposures including new soaps, detergents, or medicines. She denies any recent exposure to insects. She recently stayed at a friends house for a period of time and wonders if she may have developed the rash there.   The rash is not painful. The patient has not been feeling sick, no fevers. She has not tried any medications on the rash.  The rash is not painful. No similar previous episodes. No lesions in the mouth.  Review of Symptoms:  See HPI for ROS.   CC, SH/smoking status, and VS noted.  Objective: BP (!) 132/54   Pulse (!) 110   Temp 98.5 F (36.9 C) (Oral)   Wt 152 lb (68.9 kg)   SpO2 98%   BMI 23.81 kg/m  GEN: NAD, alert, cooperative, and pleasant. RESPIRATORY: Comfortable work of breathing, speaks in full sentences CV: Regular rate noted, distal extremities well perfused and warm without edema GI: Soft, nondistended SKIN: +numerous scabbed over flat round lesions on ventral surface of bilateral shins. No drainage, warmth, erythema.  NEURO: II-XII grossly intact MSK: Moves 4 extremities equally PSYCH: AAOx3, appropriate affect   Assessment and plan:  Rash and nonspecific skin eruption Rash is only on areas that are easy for the patient to reach, suggesting that her continued scratching of these areas is preventing the rash from resolving. She reports that she has hydroxyzine at home.  - hydroxyzine as needed for itching - triamcinolone (KENALOG) 0.025 % ointment; Apply 1 application topically 2 (two) times daily.  Dispense: 80 g; Refill: 0 - when her legs itch, she should put vasoline on the itch to prevent herself from scratching - follow up with PCP if symptoms fail to improve and resolve    No orders of the defined  types were placed in this encounter.   Meds ordered this encounter  Medications  . triamcinolone (KENALOG) 0.025 % ointment    Sig: Apply 1 application topically 2 (two) times daily.    Dispense:  80 g    Refill:  0     Everrett Coombe, MD,MS,  PGY3 10/16/2018 6:00 AM   I was present during this entire visit and procedure and agree with above Lind Covert

## 2018-10-16 NOTE — Addendum Note (Signed)
Addended by: Talbert Cage L on: 10/16/2018 09:07 AM   Modules accepted: Level of Service

## 2018-10-16 NOTE — Assessment & Plan Note (Signed)
Rash is only on areas that are easy for the patient to reach, suggesting that her continued scratching of these areas is preventing the rash from resolving. She reports that she has hydroxyzine at home.  - hydroxyzine as needed for itching - triamcinolone (KENALOG) 0.025 % ointment; Apply 1 application topically 2 (two) times daily.  Dispense: 80 g; Refill: 0 - when her legs itch, she should put vasoline on the itch to prevent herself from scratching - follow up with PCP if symptoms fail to improve and resolve

## 2018-10-17 DIAGNOSIS — E782 Mixed hyperlipidemia: Secondary | ICD-10-CM | POA: Diagnosis not present

## 2018-10-18 DIAGNOSIS — E782 Mixed hyperlipidemia: Secondary | ICD-10-CM | POA: Diagnosis not present

## 2018-10-19 DIAGNOSIS — E782 Mixed hyperlipidemia: Secondary | ICD-10-CM | POA: Diagnosis not present

## 2018-10-20 ENCOUNTER — Other Ambulatory Visit: Payer: Self-pay | Admitting: Family Medicine

## 2018-10-20 DIAGNOSIS — N63 Unspecified lump in unspecified breast: Secondary | ICD-10-CM

## 2018-10-20 DIAGNOSIS — E782 Mixed hyperlipidemia: Secondary | ICD-10-CM | POA: Diagnosis not present

## 2018-10-21 DIAGNOSIS — E782 Mixed hyperlipidemia: Secondary | ICD-10-CM | POA: Diagnosis not present

## 2018-10-22 ENCOUNTER — Other Ambulatory Visit: Payer: Self-pay

## 2018-10-22 ENCOUNTER — Other Ambulatory Visit: Payer: Self-pay | Admitting: Family Medicine

## 2018-10-22 ENCOUNTER — Ambulatory Visit
Admission: RE | Admit: 2018-10-22 | Discharge: 2018-10-22 | Disposition: A | Discharge status: Home / Self Care | Payer: Medicaid Other | Source: Ambulatory Visit | Attending: Family Medicine | Admitting: Family Medicine

## 2018-10-22 DIAGNOSIS — N6310 Unspecified lump in the right breast, unspecified quadrant: Secondary | ICD-10-CM

## 2018-10-22 DIAGNOSIS — N63 Unspecified lump in unspecified breast: Secondary | ICD-10-CM

## 2018-10-22 DIAGNOSIS — N6320 Unspecified lump in the left breast, unspecified quadrant: Principal | ICD-10-CM

## 2018-10-22 DIAGNOSIS — R922 Inconclusive mammogram: Secondary | ICD-10-CM | POA: Diagnosis not present

## 2018-10-22 DIAGNOSIS — N6489 Other specified disorders of breast: Secondary | ICD-10-CM | POA: Diagnosis not present

## 2018-10-22 DIAGNOSIS — E782 Mixed hyperlipidemia: Secondary | ICD-10-CM | POA: Diagnosis not present

## 2018-10-22 DIAGNOSIS — N6041 Mammary duct ectasia of right breast: Secondary | ICD-10-CM | POA: Diagnosis not present

## 2018-10-23 DIAGNOSIS — E782 Mixed hyperlipidemia: Secondary | ICD-10-CM | POA: Diagnosis not present

## 2018-10-24 DIAGNOSIS — E782 Mixed hyperlipidemia: Secondary | ICD-10-CM | POA: Diagnosis not present

## 2018-10-25 DIAGNOSIS — E782 Mixed hyperlipidemia: Secondary | ICD-10-CM | POA: Diagnosis not present

## 2018-10-26 DIAGNOSIS — E782 Mixed hyperlipidemia: Secondary | ICD-10-CM | POA: Diagnosis not present

## 2018-10-27 DIAGNOSIS — E782 Mixed hyperlipidemia: Secondary | ICD-10-CM | POA: Diagnosis not present

## 2018-10-28 DIAGNOSIS — E782 Mixed hyperlipidemia: Secondary | ICD-10-CM | POA: Diagnosis not present

## 2018-10-29 DIAGNOSIS — E782 Mixed hyperlipidemia: Secondary | ICD-10-CM | POA: Diagnosis not present

## 2018-10-30 DIAGNOSIS — E782 Mixed hyperlipidemia: Secondary | ICD-10-CM | POA: Diagnosis not present

## 2018-10-31 DIAGNOSIS — E782 Mixed hyperlipidemia: Secondary | ICD-10-CM | POA: Diagnosis not present

## 2018-11-01 DIAGNOSIS — E782 Mixed hyperlipidemia: Secondary | ICD-10-CM | POA: Diagnosis not present

## 2018-11-02 DIAGNOSIS — E782 Mixed hyperlipidemia: Secondary | ICD-10-CM | POA: Diagnosis not present

## 2018-11-03 DIAGNOSIS — E782 Mixed hyperlipidemia: Secondary | ICD-10-CM | POA: Diagnosis not present

## 2018-11-04 DIAGNOSIS — E782 Mixed hyperlipidemia: Secondary | ICD-10-CM | POA: Diagnosis not present

## 2018-11-05 DIAGNOSIS — E782 Mixed hyperlipidemia: Secondary | ICD-10-CM | POA: Diagnosis not present

## 2018-11-06 DIAGNOSIS — E782 Mixed hyperlipidemia: Secondary | ICD-10-CM | POA: Diagnosis not present

## 2018-11-07 DIAGNOSIS — E782 Mixed hyperlipidemia: Secondary | ICD-10-CM | POA: Diagnosis not present

## 2018-11-08 DIAGNOSIS — E782 Mixed hyperlipidemia: Secondary | ICD-10-CM | POA: Diagnosis not present

## 2018-11-09 DIAGNOSIS — E782 Mixed hyperlipidemia: Secondary | ICD-10-CM | POA: Diagnosis not present

## 2018-11-10 DIAGNOSIS — E782 Mixed hyperlipidemia: Secondary | ICD-10-CM | POA: Diagnosis not present

## 2018-11-11 DIAGNOSIS — E782 Mixed hyperlipidemia: Secondary | ICD-10-CM | POA: Diagnosis not present

## 2018-11-12 DIAGNOSIS — E782 Mixed hyperlipidemia: Secondary | ICD-10-CM | POA: Diagnosis not present

## 2018-11-13 DIAGNOSIS — E782 Mixed hyperlipidemia: Secondary | ICD-10-CM | POA: Diagnosis not present

## 2018-11-14 DIAGNOSIS — E782 Mixed hyperlipidemia: Secondary | ICD-10-CM | POA: Diagnosis not present

## 2018-11-15 DIAGNOSIS — E782 Mixed hyperlipidemia: Secondary | ICD-10-CM | POA: Diagnosis not present

## 2018-11-16 DIAGNOSIS — E782 Mixed hyperlipidemia: Secondary | ICD-10-CM | POA: Diagnosis not present

## 2018-11-17 DIAGNOSIS — E782 Mixed hyperlipidemia: Secondary | ICD-10-CM | POA: Diagnosis not present

## 2018-11-18 DIAGNOSIS — E782 Mixed hyperlipidemia: Secondary | ICD-10-CM | POA: Diagnosis not present

## 2018-11-19 DIAGNOSIS — E782 Mixed hyperlipidemia: Secondary | ICD-10-CM | POA: Diagnosis not present

## 2018-11-20 DIAGNOSIS — E782 Mixed hyperlipidemia: Secondary | ICD-10-CM | POA: Diagnosis not present

## 2018-11-21 DIAGNOSIS — E782 Mixed hyperlipidemia: Secondary | ICD-10-CM | POA: Diagnosis not present

## 2018-11-22 DIAGNOSIS — E782 Mixed hyperlipidemia: Secondary | ICD-10-CM | POA: Diagnosis not present

## 2018-11-23 DIAGNOSIS — E782 Mixed hyperlipidemia: Secondary | ICD-10-CM | POA: Diagnosis not present

## 2018-11-24 ENCOUNTER — Encounter (HOSPITAL_COMMUNITY): Payer: Self-pay

## 2018-11-24 ENCOUNTER — Ambulatory Visit (HOSPITAL_COMMUNITY)
Admission: EM | Admit: 2018-11-24 | Discharge: 2018-11-24 | Disposition: A | Discharge status: Home / Self Care | Payer: Medicaid Other | Attending: Family Medicine | Admitting: Family Medicine

## 2018-11-24 DIAGNOSIS — M79671 Pain in right foot: Secondary | ICD-10-CM | POA: Insufficient documentation

## 2018-11-24 DIAGNOSIS — G8929 Other chronic pain: Secondary | ICD-10-CM | POA: Diagnosis not present

## 2018-11-24 DIAGNOSIS — M79651 Pain in right thigh: Secondary | ICD-10-CM | POA: Diagnosis not present

## 2018-11-24 DIAGNOSIS — E782 Mixed hyperlipidemia: Secondary | ICD-10-CM | POA: Diagnosis not present

## 2018-11-24 MED ORDER — PREDNISONE 10 MG PO TABS: ORAL_TABLET | ORAL | 0 refills | Status: DC

## 2018-11-24 NOTE — ED Notes (Signed)
Patient walking in and out of room talking on cell phone.

## 2018-11-24 NOTE — ED Provider Notes (Signed)
[place]    CSN: [id number] Arrival date & time: 11/24/18  1506     History   Chief Complaint Chief Complaint  Patient presents with  . Foot Pain  . Leg Pain    HPI Rhonda Dominguez is a 57 y.o. female.   Is a 57 year old woman making her initial visit to  [facility] urgent care.  She is complaining about foot and leg pain.  On patient has seen Dr. Paulla Dolly for her foot in the past and he has operated on it as well as given her shots there.  She had a lipoma removed from her thigh in years past by Dr. Noemi Chapel.  Patient reports 2 weeks of increasing pain in her foot and right thigh.  She says it is very tender just to touch.  She has had no trauma or accidents.  There is been no swelling, fever, redness in these areas.     Past Medical History:  Diagnosis Date  . Depression   . Diabetes mellitus without complication ([place])   . Drug abuse ([place])   . Hypertension   . Pneumothorax     Patient Active Problem List   Diagnosis Date Noted  . Rash and nonspecific skin eruption 10/16/2018  . Elevated C-reactive protein (CRP) 11/10/2017  . Vitamin D deficiency 11/10/2017  . Complex regional pain syndrome type 1 of lower extremity (Right) 11/10/2017  . Cocaine abuse ([place]) 11/10/2017  . Problems influencing health status 11/10/2017  . Nicotine dependence 11/10/2017  . Osteoarthritis 11/10/2017  . NSAID long-term use 11/10/2017  . Neurogenic pain 11/10/2017  . Chronic shoulder pain (Bilateral) (L>R) 11/10/2017  . Dextroscoliosis 11/10/2017  . DDD (degenerative disc disease), cervical 11/10/2017  . Knee pain, chronic (Secondary Area of Pain) (right) 11/09/2017  . Opiate use 10/23/2017  . Great toe pain (Primary Area of Pain) (Right) 10/23/2017  . Chronic lower extremity pain [facility] Area of Pain) (Right) 10/23/2017  . Chronic pain syndrome 10/23/2017  . Disorder of skeletal system 10/23/2017  . Other reduced mobility 10/23/2017  . Urinary incontinence 11/20/2016  .  Restrictive lung disease 07/23/2016  . Toe pain 04/13/2016  . Essential hypertension 03/22/2016  . Pharmacologic therapy 12/14/2015  . High risk bisexual behavior 05/18/2015  . CATARACTS, BILATERAL 06/24/2007  . Bipolar affective disorder ([place]) 10/03/2006  . CONSTIPATION 10/03/2006  . Eczema 10/03/2006    History reviewed. No pertinent surgical history.  OB History   No obstetric history on file.      Home Medications    Prior to Admission medications   Medication Sig Start Date End Date Taking? Authorizing Provider  albuterol (PROAIR HFA) 108 (90 Base) MCG/ACT inhaler inhale 2 puffs every 6 hours if needed for wheezing or shortness of breath 04/15/18   Nicolette Bang, DO  amLODipine (NORVASC) 10 MG tablet TAKE 1 TABLET BY MOUTH ONCE DAILY 09/01/18   Sela Hilding, MD  aspirin 81 MG tablet Take 81 mg by mouth daily.    [provider]  benztropine (COGENTIN) 1 MG tablet Take 0.5 tablets (0.5 mg total) by mouth daily. 06/14/16   Rumley, [place] N, DO  buPROPion (WELLBUTRIN XL) 300 MG 24 hr tablet Take 1 tablet (300 mg total) by mouth daily. 06/14/16   Rumley, Burna Cash, DO  Calcium Carb-Cholecalciferol (CALCIUM PLUS D3 ABSORBABLE) [phone] MG-UNIT CAPS Take 1 capsule by mouth daily with breakfast. 09/02/18 12/01/18  Sela Hilding, MD  diclofenac sodium (VOLTAREN) 1 % GEL Apply 2 g topically 4 (four) times  daily. 11/04/17   Leandrew Koyanagi, MD  divalproex (DEPAKOTE) 500 MG DR tablet Take 500 mg by mouth 2 (two) times daily.    [provider]  Fluticasone-Umeclidin-Vilant (TRELEGY ELLIPTA) 100-62.5-25 MCG/INH AEPB Inhale 1 puff into the lungs daily. 09/25/17   Zenia Resides, MD  gabapentin (NEURONTIN) 400 MG capsule Take 1 capsule (400 mg total) by mouth 3 (three) times daily. 06/05/17   Rumley, Burna Cash, DO  hydrochlorothiazide (HYDRODIURIL) 25 MG tablet Take 1 tablet (25 mg total) by mouth daily. 08/12/17   Lind Covert, MD  hydrOXYzine  (ATARAX/VISTARIL) 25 MG tablet TAKE 1 TABLET(25 MG) BY MOUTH EVERY 8 HOURS AS NEEDED FOR ANXIETY OR ITCHING 10/15/18   Sela Hilding, MD  ibuprofen (ADVIL,MOTRIN) 800 MG tablet Take 800 mg by mouth every 8 (eight) hours as needed.    [provider]  lamoTRIgine (LAMICTAL) 25 MG tablet Take 1 tablet (25 mg total) by mouth 2 (two) times daily. 06/14/16   Rumley, [place] N, DO  meloxicam (MOBIC) 7.5 MG tablet TAKE 1 TABLET BY MOUTH TWICE DAILY AS NEEDED FOR PAIN 07/21/18   Leandrew Koyanagi, MD  nicotine (NICODERM CQ - DOSED IN MG/24 HOURS) 14 mg/24hr patch Place 1 patch (14 mg total) onto the skin daily. 07/10/17   Zenia Resides, MD  nortriptyline (PAMELOR) 25 MG capsule Take 1 capsule (25 mg total) by mouth at bedtime. 06/05/17   Rumley, Burna Cash, DO  predniSONE (DELTASONE) 10 MG tablet 1 daily with food 11/24/18   Robyn Haber, MD  SPIRIVA HANDIHALER 18 MCG inhalation capsule Place 1 Inhaler into inhaler and inhale daily. 06/05/17   [provider]  triamcinolone (KENALOG) 0.025 % ointment Apply 1 application topically 2 (two) times daily. 10/15/18   Everrett Coombe, MD    Family History History reviewed. No pertinent family history.  Social History Social History   Tobacco Use  . Smoking status: Former Research scientist (life sciences)  . Smokeless tobacco: Never Used  . Tobacco comment: one cig a day  Substance Use Topics  . Alcohol use: No  . Drug use: No     Allergies   Patient has no known allergies.   Review of Systems Review of Systems   Physical Exam Triage Vital Signs ED Triage Vitals  Enc Vitals Group     BP 11/24/18 1517 (!) 132/92     Pulse Rate 11/24/18 1517 82     Resp 11/24/18 1517 16     Temp 11/24/18 1517 98.1 F (36.7 C)     Temp Source 11/24/18 1517 Oral     SpO2 11/24/18 1517 98 %     Weight --      Height --      Head Circumference --      Peak Flow --      Pain Score 11/24/18 1518 10     Pain Loc --      Pain Edu? --      Excl. in [place]? --    No data  found.  Updated Vital Signs BP (!) 132/92 (BP Location: Right Arm)   Pulse 82   Temp 98.1 F (36.7 C) (Oral)   Resp 16   SpO2 98%    Physical Exam Vitals signs and nursing note reviewed.  Constitutional:      Appearance: Normal appearance.  HENT:     Head: Normocephalic and atraumatic.     Right Ear: External ear normal.     Left Ear: External ear normal.  Mouth/Throat:     Mouth: Mucous membranes are moist.  Eyes:     Conjunctiva/sclera: Conjunctivae normal.  Neck:     Musculoskeletal: Normal range of motion and neck supple.  Cardiovascular:     Rate and Rhythm: Normal rate.     Heart sounds: Normal heart sounds.  Pulmonary:     Effort: Pulmonary effort is normal.  Musculoskeletal: Normal range of motion.        General: Tenderness present. No swelling or deformity.     Right lower leg: No edema.     Left lower leg: No edema.  Skin:    General: Skin is warm and dry.     Comments: She has well-healed scars from surgery on her dorsal foot.  She has light touch tenderness to her thigh and dorsal foot.  Neurological:     General: No focal deficit present.     Mental Status: She is alert.  Psychiatric:     Comments: Patient is quite talkative and demonstrative.  She jumps whenever touched.      UC Treatments / Results  Labs (all labs ordered are listed, but only abnormal results are displayed) Labs Reviewed - No data to display  EKG None  Radiology No results found.  Procedures Procedures (including critical care time)  Medications Ordered in UC Medications - No data to display  Initial Impression / Assessment and Plan / UC Course  I have reviewed the triage vital signs and the nursing notes.  Pertinent labs & imaging results that were available during my care of the patient were reviewed by me and considered in my medical decision making (see chart for details).    Final Clinical Impressions(s) / UC Diagnoses   Final diagnoses:  Chronic foot  pain, right  Acute thigh pain, right     Discharge Instructions     Follow-up with your doctors if the pain persists    ED Prescriptions    Medication Sig Dispense Auth. Provider   predniSONE (DELTASONE) 10 MG tablet 1 daily with food 5 tablet Robyn Haber, MD     Controlled Substance Prescriptions [place] Controlled Substance Registry consulted? Not Applicable   Robyn Haber, MD 11/24/18 1600

## 2018-11-24 NOTE — Discharge Instructions (Addendum)
Follow-up with your doctors if the pain persists

## 2018-11-25 ENCOUNTER — Other Ambulatory Visit: Payer: Self-pay | Admitting: Family Medicine

## 2018-11-25 DIAGNOSIS — E782 Mixed hyperlipidemia: Secondary | ICD-10-CM | POA: Diagnosis not present

## 2018-11-25 DIAGNOSIS — R21 Rash and other nonspecific skin eruption: Secondary | ICD-10-CM

## 2018-11-25 DIAGNOSIS — I10 Essential (primary) hypertension: Secondary | ICD-10-CM

## 2018-11-26 DIAGNOSIS — E782 Mixed hyperlipidemia: Secondary | ICD-10-CM | POA: Diagnosis not present

## 2018-11-27 DIAGNOSIS — E782 Mixed hyperlipidemia: Secondary | ICD-10-CM | POA: Diagnosis not present

## 2018-11-28 DIAGNOSIS — E782 Mixed hyperlipidemia: Secondary | ICD-10-CM | POA: Diagnosis not present

## 2018-11-29 DIAGNOSIS — E782 Mixed hyperlipidemia: Secondary | ICD-10-CM | POA: Diagnosis not present

## 2018-11-30 DIAGNOSIS — E782 Mixed hyperlipidemia: Secondary | ICD-10-CM | POA: Diagnosis not present

## 2018-12-01 DIAGNOSIS — E782 Mixed hyperlipidemia: Secondary | ICD-10-CM | POA: Diagnosis not present

## 2018-12-03 DIAGNOSIS — E782 Mixed hyperlipidemia: Secondary | ICD-10-CM | POA: Diagnosis not present

## 2018-12-04 DIAGNOSIS — E782 Mixed hyperlipidemia: Secondary | ICD-10-CM | POA: Diagnosis not present

## 2018-12-05 DIAGNOSIS — E782 Mixed hyperlipidemia: Secondary | ICD-10-CM | POA: Diagnosis not present

## 2018-12-06 DIAGNOSIS — E782 Mixed hyperlipidemia: Secondary | ICD-10-CM | POA: Diagnosis not present

## 2018-12-07 DIAGNOSIS — E782 Mixed hyperlipidemia: Secondary | ICD-10-CM | POA: Diagnosis not present

## 2018-12-08 DIAGNOSIS — E782 Mixed hyperlipidemia: Secondary | ICD-10-CM | POA: Diagnosis not present

## 2018-12-09 DIAGNOSIS — E782 Mixed hyperlipidemia: Secondary | ICD-10-CM | POA: Diagnosis not present

## 2018-12-10 DIAGNOSIS — E782 Mixed hyperlipidemia: Secondary | ICD-10-CM | POA: Diagnosis not present

## 2018-12-11 DIAGNOSIS — E782 Mixed hyperlipidemia: Secondary | ICD-10-CM | POA: Diagnosis not present

## 2018-12-12 DIAGNOSIS — E782 Mixed hyperlipidemia: Secondary | ICD-10-CM | POA: Diagnosis not present

## 2018-12-13 DIAGNOSIS — E782 Mixed hyperlipidemia: Secondary | ICD-10-CM | POA: Diagnosis not present

## 2018-12-14 DIAGNOSIS — E782 Mixed hyperlipidemia: Secondary | ICD-10-CM | POA: Diagnosis not present

## 2018-12-15 DIAGNOSIS — E782 Mixed hyperlipidemia: Secondary | ICD-10-CM | POA: Diagnosis not present

## 2018-12-16 DIAGNOSIS — E782 Mixed hyperlipidemia: Secondary | ICD-10-CM | POA: Diagnosis not present

## 2018-12-17 DIAGNOSIS — E782 Mixed hyperlipidemia: Secondary | ICD-10-CM | POA: Diagnosis not present

## 2018-12-18 DIAGNOSIS — E782 Mixed hyperlipidemia: Secondary | ICD-10-CM | POA: Diagnosis not present

## 2018-12-19 DIAGNOSIS — E782 Mixed hyperlipidemia: Secondary | ICD-10-CM | POA: Diagnosis not present

## 2018-12-20 DIAGNOSIS — E782 Mixed hyperlipidemia: Secondary | ICD-10-CM | POA: Diagnosis not present

## 2018-12-21 ENCOUNTER — Other Ambulatory Visit: Payer: Self-pay | Admitting: Podiatry

## 2018-12-21 ENCOUNTER — Ambulatory Visit (INDEPENDENT_AMBULATORY_CARE_PROVIDER_SITE_OTHER): Payer: Medicaid Other

## 2018-12-21 ENCOUNTER — Encounter: Payer: Self-pay | Admitting: Podiatry

## 2018-12-21 ENCOUNTER — Ambulatory Visit: Payer: Medicaid Other | Admitting: Podiatry

## 2018-12-21 ENCOUNTER — Other Ambulatory Visit: Payer: Self-pay | Admitting: Family Medicine

## 2018-12-21 ENCOUNTER — Ambulatory Visit (INDEPENDENT_AMBULATORY_CARE_PROVIDER_SITE_OTHER): Payer: Medicaid Other | Admitting: Podiatry

## 2018-12-21 DIAGNOSIS — M79674 Pain in right toe(s): Secondary | ICD-10-CM

## 2018-12-21 DIAGNOSIS — M79675 Pain in left toe(s): Secondary | ICD-10-CM | POA: Diagnosis not present

## 2018-12-21 DIAGNOSIS — G629 Polyneuropathy, unspecified: Secondary | ICD-10-CM | POA: Diagnosis not present

## 2018-12-21 DIAGNOSIS — M79671 Pain in right foot: Secondary | ICD-10-CM | POA: Diagnosis not present

## 2018-12-21 DIAGNOSIS — B351 Tinea unguium: Secondary | ICD-10-CM

## 2018-12-21 DIAGNOSIS — I10 Essential (primary) hypertension: Secondary | ICD-10-CM

## 2018-12-21 DIAGNOSIS — E782 Mixed hyperlipidemia: Secondary | ICD-10-CM | POA: Diagnosis not present

## 2018-12-22 ENCOUNTER — Other Ambulatory Visit: Payer: Self-pay | Admitting: Family Medicine

## 2018-12-22 ENCOUNTER — Telehealth: Payer: Self-pay | Admitting: Podiatry

## 2018-12-22 DIAGNOSIS — E782 Mixed hyperlipidemia: Secondary | ICD-10-CM | POA: Diagnosis not present

## 2018-12-22 DIAGNOSIS — R21 Rash and other nonspecific skin eruption: Secondary | ICD-10-CM

## 2018-12-22 NOTE — Telephone Encounter (Signed)
Marcy Siren and Janett Billow:  I will talk to Dr. Paulla Dolly today and ask him about this. I do remember the patient being here, but do not remember [place] needing to send anything to the patient.  Thank you, Juliann Pulse

## 2018-12-22 NOTE — Telephone Encounter (Signed)
Pt was seen yesterday and was told to call back with Fax# for her PCP so that paperwork could be sent over for her. Pt has an appt scheduled for this Thursday, 1/16 and is needing the paperwork faxed prior to that visit.  Pt has also requested that we give her a call once the forms have been faxed.   PCP - Garen Lah. Jeannine Kitten  Fax# [phone]

## 2018-12-22 NOTE — Telephone Encounter (Signed)
Pt called back to follow up on status of paperwork. Pt asked for a call back today because her appointment with her PCP is on Thursday morning at 8:45 am.

## 2018-12-23 DIAGNOSIS — E782 Mixed hyperlipidemia: Secondary | ICD-10-CM | POA: Diagnosis not present

## 2018-12-23 NOTE — Telephone Encounter (Signed)
Marcy Siren and Jesscia:  I talked to Dr. Paulla Dolly this morning and relayed your message to him. He has to dictate his note in the chart today and then he can send the note to her doctor.  Thank you, Juliann Pulse

## 2018-12-23 NOTE — Telephone Encounter (Signed)
I called pt and informed Dr. Paulla Dolly had completed note for 12/21/2018, and I would immediately fax to Dr. Jeannine Kitten. Pt thanked me. Faxed 12/21/2018 clinicals to Dr. Addison Naegeli with note informing his office pt has an appt tomorrow.

## 2018-12-23 NOTE — Telephone Encounter (Signed)
Pt called back wanting to know if fax went through. After looking through the faxes I found the fax confirmation and told the pt it went through at 12:18 pm.

## 2018-12-23 NOTE — Progress Notes (Signed)
Subjective:   Patient ID: Rhonda Dominguez, female   DOB: 58 y.o.   MRN: [id number]   HPI Patient presents with a lot of pain in the right foot leg and extending into the thigh.  She states it is burning and shooting and that it seems to affect the entire right side.  She does not remember specific injury and is always had trouble with nail disease also and callus formation   ROS      Objective:  Physical Exam  Vascular status intact with moderate diminishment of sharp dull vibratory noted right over left.  Patient does have what appears to be mild muscle weakness right and has also noted to have thick brittle nailbeds 1-5 both feet that she cannot cut and become sore.  Patient does seem to be getting a lot of radiating shooting zinger-like pains into her right leg     Assessment:  Strong possibility that there may be a nerve entrapment or pinch which may be creating right leg symptoms with patient also having mycotic nail infection symptomatic 1-5 both feet     Plan:  H&P and condition discussed with patient.  We discussed neuropathy we discussed the possibility for a nerve pinch occurring in her back.  I have recommended that she see either a neurologist or a physician and have MRI performed of her back to rule out compression or disc disease.  Today I debrided nailbeds 1-5 both feet with no iatrogenic bleeding noted

## 2018-12-24 ENCOUNTER — Ambulatory Visit (INDEPENDENT_AMBULATORY_CARE_PROVIDER_SITE_OTHER): Payer: Medicaid Other | Admitting: Family Medicine

## 2018-12-24 ENCOUNTER — Other Ambulatory Visit: Payer: Self-pay

## 2018-12-24 ENCOUNTER — Encounter: Payer: Self-pay | Admitting: Family Medicine

## 2018-12-24 VITALS — BP 138/88 | HR 72 | Temp 98.1°F | Ht 67.0 in | Wt 152.0 lb

## 2018-12-24 DIAGNOSIS — M79604 Pain in right leg: Secondary | ICD-10-CM | POA: Diagnosis not present

## 2018-12-24 DIAGNOSIS — E782 Mixed hyperlipidemia: Secondary | ICD-10-CM | POA: Diagnosis not present

## 2018-12-24 DIAGNOSIS — G8929 Other chronic pain: Secondary | ICD-10-CM | POA: Diagnosis not present

## 2018-12-24 DIAGNOSIS — F3112 Bipolar disorder, current episode manic without psychotic features, moderate: Secondary | ICD-10-CM | POA: Diagnosis not present

## 2018-12-24 MED ORDER — NORTRIPTYLINE HCL 25 MG PO CAPS: 25.0000 mg | ORAL_CAPSULE | Freq: Every day | ORAL | 2 refills | Status: DC

## 2018-12-24 NOTE — Progress Notes (Signed)
[facility] Phone: [phone]   cc: right foot and leg pain  Subjective:  Patient has had right foot and leg pain since 2017 when she dropped a heavy object on it.  it is a tingling/pins and needles sensation. She also describes occasional numbness in that leg.  She does not take any medications for this pain. Sometimes she uses heat.  nothing has made it better since it started.  She has had multiple foot surgeries and got a steroid injection on her toe in December. She states her podiatrist said there is nothing more they can do for her and that it is possibly a back issue.  She says she would like to get an MRI.    ROS: See HPI for pertinent positives and negatives  Past Medical History  Family history reviewed for today's visit. No changes.   Objective: BP 138/88 (BP Location: Left Arm, Patient Position: Sitting, Cuff Size: Normal)   Pulse 72   Temp 98.1 F (36.7 C) (Oral)   Ht 5\' 7"  (1.702 m)   Wt 152 lb (68.9 kg)   SpO2 97%   BMI 23.81 kg/m  Gen: NAD, alert and oriented x3 CV: normal rate, regular rhythm. No murmurs, no rubs.  Resp: LCTAB, no wheezes, crackles. normal work of breathing Msk: patient has exquisite tenderness to palpation of the entire right leg and foot. Patient walks slowly and deliberately with pronounced limp on right side.  Patient was later seen walking normally without cane.  Neuro: CN II-XII grossly intact. When monofilament test was performed, the left side was normal and right side patient did not endorse sensation, but at times her foot did withdraw to the touch. Light touch sensation was intact on feet bilaterally.  Entire right leg was TTP.   Psych: patient very argumentative, interrupting frequently and accusing people of not knowing taking her seriously.  Her speech is rushed.   Assessment/Plan: Chronic lower extremity pain (Tertiary Area of Pain) (Right) Patient complains of R lower extremity that has been ongoing  since 2017 when she was diagnosed with chronic regional pain syndrome after dropping something on her foot.  Patient has been seeing a podiatrist who said that there is nothing else she can do for her and suggested her pain could be related to a radiculopathy.  Patient was adamant that she get an MRI.  Will get patient a lumbar xray and if indicated, an MRI after next visit.  Also started patient on Nortriptyline for nerve pain.  She was previously on it as some point and it is listed in her current medications, but she states she is not taking it currently.  Patient's physical exam was difficult due to conflicting findings.  She showed signs of allodynia of the right leg, and also decreased sensation on foot exam.  When assessing her gait she walked with a pronounced limp, and use of cane, but later was seen with normal gait and walking without assistance of cane.  There is a definite psychological component to her symptoms clouding the overall picture.      Bipolar affective disorder [facility]) Patient has a history of bipolar disorder and her current medications listed include wellbutrin, lamotrigine, and Depakote. These medications appear to be outdated though.  Patient could not say what she was actually taking and said 'we should know'.  She states she goes to [facility] for her mental health treatment currently.  Patient appeared hyperactive and paranoid during exam, interrupting frequently, and generally being  impatient.  She was accusatory with staff throughout her visit.  We will need an updated list of her medications from [facility], but she left before we were able to get a release of information form signed.       Clemetine Marker, MD PGY-1

## 2018-12-24 NOTE — Assessment & Plan Note (Signed)
Patient complains of R lower extremity that has been ongoing since 2017 when she was diagnosed with chronic regional pain syndrome after dropping something on her foot.  Patient has been seeing a podiatrist who said that there is nothing else she can do for her and suggested her pain could be related to a radiculopathy.  Patient was adamant that she get an MRI.  Will get patient a lumbar xray and if indicated, an MRI after next visit.  Also started patient on Nortriptyline for nerve pain.  She was previously on it as some point and it is listed in her current medications, but she states she is not taking it currently.  Patient's physical exam was difficult due to conflicting findings.  She showed signs of allodynia of the right leg, and also decreased sensation on foot exam.  When assessing her gait she walked with a pronounced limp, and use of cane, but later was seen with normal gait and walking without assistance of cane.  There is a definite psychological component to her symptoms clouding the overall picture.

## 2018-12-24 NOTE — Patient Instructions (Signed)
We are getting an xray of your spine and also restarting your nortriptyline.  After that we can see if the MRI is necessary for your pain.  Your next appointment is on February 5th.

## 2018-12-24 NOTE — Assessment & Plan Note (Signed)
Patient has a history of bipolar disorder and her current medications listed include wellbutrin, lamotrigine, and Depakote. These medications appear to be outdated though.  Patient could not say what she was actually taking and said 'we should know'.  She states she goes to [facility] for her mental health treatment currently.  Patient appeared hyperactive and paranoid during exam, interrupting frequently, and generally being impatient.  She was accusatory with staff throughout her visit.  We will need an updated list of her medications from [facility], but she left before we were able to get a release of information form signed.

## 2018-12-25 DIAGNOSIS — E782 Mixed hyperlipidemia: Secondary | ICD-10-CM | POA: Diagnosis not present

## 2018-12-26 DIAGNOSIS — E782 Mixed hyperlipidemia: Secondary | ICD-10-CM | POA: Diagnosis not present

## 2018-12-27 DIAGNOSIS — E782 Mixed hyperlipidemia: Secondary | ICD-10-CM | POA: Diagnosis not present

## 2018-12-28 DIAGNOSIS — E782 Mixed hyperlipidemia: Secondary | ICD-10-CM | POA: Diagnosis not present

## 2018-12-29 DIAGNOSIS — E782 Mixed hyperlipidemia: Secondary | ICD-10-CM | POA: Diagnosis not present

## 2018-12-30 DIAGNOSIS — E782 Mixed hyperlipidemia: Secondary | ICD-10-CM | POA: Diagnosis not present

## 2018-12-31 DIAGNOSIS — E782 Mixed hyperlipidemia: Secondary | ICD-10-CM | POA: Diagnosis not present

## 2019-01-01 DIAGNOSIS — E782 Mixed hyperlipidemia: Secondary | ICD-10-CM | POA: Diagnosis not present

## 2019-01-02 DIAGNOSIS — E782 Mixed hyperlipidemia: Secondary | ICD-10-CM | POA: Diagnosis not present

## 2019-01-03 DIAGNOSIS — E782 Mixed hyperlipidemia: Secondary | ICD-10-CM | POA: Diagnosis not present

## 2019-01-04 DIAGNOSIS — E782 Mixed hyperlipidemia: Secondary | ICD-10-CM | POA: Diagnosis not present

## 2019-01-05 ENCOUNTER — Ambulatory Visit (HOSPITAL_COMMUNITY)
Admission: RE | Admit: 2019-01-05 | Discharge: 2019-01-05 | Disposition: A | Discharge status: Home / Self Care | Payer: Medicaid Other | Source: Ambulatory Visit | Attending: Family Medicine | Admitting: Family Medicine

## 2019-01-05 DIAGNOSIS — M4186 Other forms of scoliosis, lumbar region: Secondary | ICD-10-CM | POA: Diagnosis not present

## 2019-01-05 DIAGNOSIS — M79604 Pain in right leg: Secondary | ICD-10-CM | POA: Insufficient documentation

## 2019-01-05 DIAGNOSIS — E782 Mixed hyperlipidemia: Secondary | ICD-10-CM | POA: Diagnosis not present

## 2019-01-05 DIAGNOSIS — M4807 Spinal stenosis, lumbosacral region: Secondary | ICD-10-CM | POA: Diagnosis not present

## 2019-01-05 DIAGNOSIS — G8929 Other chronic pain: Secondary | ICD-10-CM | POA: Diagnosis not present

## 2019-01-06 DIAGNOSIS — E782 Mixed hyperlipidemia: Secondary | ICD-10-CM | POA: Diagnosis not present

## 2019-01-07 DIAGNOSIS — E782 Mixed hyperlipidemia: Secondary | ICD-10-CM | POA: Diagnosis not present

## 2019-01-08 DIAGNOSIS — E782 Mixed hyperlipidemia: Secondary | ICD-10-CM | POA: Diagnosis not present

## 2019-01-09 DIAGNOSIS — E782 Mixed hyperlipidemia: Secondary | ICD-10-CM | POA: Diagnosis not present

## 2019-01-10 DIAGNOSIS — E782 Mixed hyperlipidemia: Secondary | ICD-10-CM | POA: Diagnosis not present

## 2019-01-11 DIAGNOSIS — E782 Mixed hyperlipidemia: Secondary | ICD-10-CM | POA: Diagnosis not present

## 2019-01-12 DIAGNOSIS — E782 Mixed hyperlipidemia: Secondary | ICD-10-CM | POA: Diagnosis not present

## 2019-01-13 ENCOUNTER — Telehealth: Payer: Self-pay | Admitting: *Deleted

## 2019-01-13 ENCOUNTER — Encounter: Payer: Self-pay | Admitting: Family Medicine

## 2019-01-13 ENCOUNTER — Ambulatory Visit (INDEPENDENT_AMBULATORY_CARE_PROVIDER_SITE_OTHER): Payer: Medicaid Other | Admitting: Family Medicine

## 2019-01-13 ENCOUNTER — Ambulatory Visit: Payer: Medicaid Other | Admitting: Family Medicine

## 2019-01-13 DIAGNOSIS — F3112 Bipolar disorder, current episode manic without psychotic features, moderate: Secondary | ICD-10-CM

## 2019-01-13 DIAGNOSIS — E782 Mixed hyperlipidemia: Secondary | ICD-10-CM | POA: Diagnosis not present

## 2019-01-13 DIAGNOSIS — G90521 Complex regional pain syndrome I of right lower limb: Secondary | ICD-10-CM

## 2019-01-13 MED ORDER — DICLOFENAC SODIUM 1 % TD GEL: 2.0000 g | Freq: Four times a day (QID) | TRANSDERMAL | 5 refills | Status: DC

## 2019-01-13 MED ORDER — GABAPENTIN 300 MG PO CAPS: 300.0000 mg | ORAL_CAPSULE | Freq: Three times a day (TID) | ORAL | 0 refills | Status: DC

## 2019-01-13 NOTE — Patient Instructions (Signed)
It was a pleasure to see you today! Thank you for choosing [facility] for your primary care. Rhonda Dominguez was seen for back pain.   Our plans for today were:  For your back pain, please pick up the cream and the gabapentin medicine. You can apply this cream. Call me if the gabapentin seems to help but if you think you may need to increase it.   Please call [facility] about being seen for your mood.   Best,  Dr. Lindell Noe

## 2019-01-13 NOTE — Progress Notes (Signed)
   CC: back pain  HPI  R lumbar back pain - has been in bed the last 2 weeks she says. She wants to know the results of her XR. She has not tried tylenol or ibuprofen. She feels she may need a back brace. Thinks its been worse for 3 weeks. Hx of podiatry surgery 3x. Out of gabapentin. It taking nortripyline. Feels like burning.   She asks me for mood medicine.  When redirected, she recalls that she is supposed to be going to [facility] for bipolar disorder.  Quit smoking 2 weeks ago.   ROS: Denies CP, SOB, abdominal pain, dysuria, changes in BMs.   CC, SH/smoking status, and VS noted  Objective: BP 126/72   Pulse 76   Temp 98.1 F (36.7 C) (Oral)   Ht 5\' 7"  (1.702 m)   Wt 155 lb (70.3 kg)   SpO2 99%   BMI 24.28 kg/m  Gen: NAD, alert, cooperative, and pleasant. HEENT: NCAT, EOMI, PERRL CV: RRR, no murmur Resp: CTAB, no wheezes, non-labored Ext: No edema, warm, normal gait, patient has tenderness over right lumbar musculature.  Normal range of motion bilateral hips.  Neuro: Alert and oriented, Speech clear, No gross deficits  Assessment and plan:  Complex regional pain syndrome type 1 of lower extremity (Right)  Patient has chronic right-sided back leg and foot pain.  She is recently been to the podiatrist who noted that there were no foot abnormalities that would be causing this pain.  She has not been taking any of her medication that was previously prescribed for complex regional pain syndrome.  She also demanded a back brace.  I explained that her insurance likely will not pay for me to prescribe a back brace, but she could try 1 over-the-counter.  We will restart her gabapentin as this certainly can be helpful in complex regional pain syndrome, we will also try topical Voltaren gel.  She should return in a month to recheck how this is doing.  Bipolar affective disorder [facility]) Patient continues not to follow-up with psychiatry.  I again encouraged her to go to [facility] today, she  reports she will do so.  This continues to limit her medical care as she has poor insight into her conditions and often stops taking medicines without telling us.   No orders of the defined types were placed in this encounter.   Meds ordered this encounter  Medications  . gabapentin (NEURONTIN) 300 MG capsule    Sig: Take 1 capsule (300 mg total) by mouth 3 (three) times daily.    Dispense:  90 capsule    Refill:  0  . diclofenac sodium (VOLTAREN) 1 % GEL    Sig: Apply 2 g topically 4 (four) times daily.    Dispense:  1 Tube    Refill:  5     Ralene Ok, MD, PGY3 01/14/2019 11:20 AM

## 2019-01-13 NOTE — Telephone Encounter (Signed)
Prior approval for diclofenac gel (voltaren is on Psychologist, prison and probation services) completed via [place] [employer].  Med approved for 01/13/19 - 01/13/20  Prior approval # [id number].  [facility] pharmacy informed.  Zenab Gronewold, Salome Spotted, CMA

## 2019-01-14 ENCOUNTER — Ambulatory Visit: Payer: Medicaid Other | Admitting: Pharmacist

## 2019-01-14 DIAGNOSIS — E782 Mixed hyperlipidemia: Secondary | ICD-10-CM | POA: Diagnosis not present

## 2019-01-14 NOTE — Assessment & Plan Note (Signed)
Patient has chronic right-sided back leg and foot pain.  She is recently been to the podiatrist who noted that there were no foot abnormalities that would be causing this pain.  She has not been taking any of her medication that was previously prescribed for complex regional pain syndrome.  She also demanded a back brace.  I explained that her insurance likely will not pay for me to prescribe a back brace, but she could try 1 over-the-counter.  We will restart her gabapentin as this certainly can be helpful in complex regional pain syndrome, we will also try topical Voltaren gel.  She should return in a month to recheck how this is doing.

## 2019-01-14 NOTE — Assessment & Plan Note (Signed)
Patient continues not to follow-up with psychiatry.  I again encouraged her to go to [place] today, she reports she will do so.  This continues to limit her medical care as she has poor insight into her conditions and often stops taking medicines without telling us.

## 2019-01-15 DIAGNOSIS — E782 Mixed hyperlipidemia: Secondary | ICD-10-CM | POA: Diagnosis not present

## 2019-01-16 DIAGNOSIS — E782 Mixed hyperlipidemia: Secondary | ICD-10-CM | POA: Diagnosis not present

## 2019-01-17 DIAGNOSIS — E782 Mixed hyperlipidemia: Secondary | ICD-10-CM | POA: Diagnosis not present

## 2019-01-18 DIAGNOSIS — E782 Mixed hyperlipidemia: Secondary | ICD-10-CM | POA: Diagnosis not present

## 2019-01-19 DIAGNOSIS — E782 Mixed hyperlipidemia: Secondary | ICD-10-CM | POA: Diagnosis not present

## 2019-01-20 DIAGNOSIS — E782 Mixed hyperlipidemia: Secondary | ICD-10-CM | POA: Diagnosis not present

## 2019-01-21 ENCOUNTER — Ambulatory Visit: Payer: Medicaid Other

## 2019-01-21 DIAGNOSIS — E782 Mixed hyperlipidemia: Secondary | ICD-10-CM | POA: Diagnosis not present

## 2019-01-22 DIAGNOSIS — E782 Mixed hyperlipidemia: Secondary | ICD-10-CM | POA: Diagnosis not present

## 2019-01-23 DIAGNOSIS — E782 Mixed hyperlipidemia: Secondary | ICD-10-CM | POA: Diagnosis not present

## 2019-01-24 DIAGNOSIS — E782 Mixed hyperlipidemia: Secondary | ICD-10-CM | POA: Diagnosis not present

## 2019-01-25 DIAGNOSIS — E782 Mixed hyperlipidemia: Secondary | ICD-10-CM | POA: Diagnosis not present

## 2019-01-26 ENCOUNTER — Encounter: Payer: Self-pay | Admitting: Family Medicine

## 2019-01-26 DIAGNOSIS — E782 Mixed hyperlipidemia: Secondary | ICD-10-CM | POA: Diagnosis not present

## 2019-01-27 DIAGNOSIS — E782 Mixed hyperlipidemia: Secondary | ICD-10-CM | POA: Diagnosis not present

## 2019-01-28 DIAGNOSIS — E782 Mixed hyperlipidemia: Secondary | ICD-10-CM | POA: Diagnosis not present

## 2019-01-29 DIAGNOSIS — E782 Mixed hyperlipidemia: Secondary | ICD-10-CM | POA: Diagnosis not present

## 2019-01-30 DIAGNOSIS — E782 Mixed hyperlipidemia: Secondary | ICD-10-CM | POA: Diagnosis not present

## 2019-01-31 DIAGNOSIS — E782 Mixed hyperlipidemia: Secondary | ICD-10-CM | POA: Diagnosis not present

## 2019-02-01 DIAGNOSIS — E782 Mixed hyperlipidemia: Secondary | ICD-10-CM | POA: Diagnosis not present

## 2019-02-02 DIAGNOSIS — E782 Mixed hyperlipidemia: Secondary | ICD-10-CM | POA: Diagnosis not present

## 2019-02-03 DIAGNOSIS — E782 Mixed hyperlipidemia: Secondary | ICD-10-CM | POA: Diagnosis not present

## 2019-02-04 ENCOUNTER — Encounter: Payer: Self-pay | Admitting: Family Medicine

## 2019-02-04 ENCOUNTER — Ambulatory Visit (INDEPENDENT_AMBULATORY_CARE_PROVIDER_SITE_OTHER): Payer: Medicaid Other | Admitting: Family Medicine

## 2019-02-04 ENCOUNTER — Other Ambulatory Visit: Payer: Self-pay

## 2019-02-04 VITALS — Ht 67.0 in | Wt 160.0 lb

## 2019-02-04 DIAGNOSIS — E782 Mixed hyperlipidemia: Secondary | ICD-10-CM | POA: Diagnosis not present

## 2019-02-04 DIAGNOSIS — F17200 Nicotine dependence, unspecified, uncomplicated: Secondary | ICD-10-CM

## 2019-02-04 DIAGNOSIS — G90521 Complex regional pain syndrome I of right lower limb: Secondary | ICD-10-CM

## 2019-02-04 MED ORDER — NICOTINE 14 MG/24HR TD PT24: 14.0000 mg | MEDICATED_PATCH | Freq: Every day | TRANSDERMAL | 2 refills | Status: DC

## 2019-02-04 NOTE — Patient Instructions (Addendum)
The best thing you can do for your health is to stop smoking. I sent a patch refill.   Complex Regional Pain Syndrome Complex regional pain syndrome (CRPS) is a nerve disorder that causes long-term (chronic) pain. This is usually in a hand, arm, foot, or leg. CRPS usually occurs after an injury or trauma, such as a fracture or sprain. There are two types of CRPS:  Type 1. This type occurs after an injury with no known damage to a nerve.  Type 2. This type occurs after an injury that damages a nerve. There are three stages of the condition:  Stage 1. This stage, called the acute stage, may last for up to 3 months.  Stage 2. This stage, called the dystrophic stage, may last for 3-12 months.  Stage 3. This stage, called the atrophic stage, may start after one year. CRPS ranges from mild to severe. For most people, CRPS is mild and recovery happens over time. For others, CRPS lasts for a very long time and makes everyday tasks hard to do. What are the causes? The exact cause of this condition is not known. It is usually triggered by an injury. What increases the risk? You are more likely to develop this condition if:  You are female.  You have any of the following injuries: ? A wrist fracture that involves a lower arm bone (distal radius fracture). ? Ankle dislocation or fracture. ? A long surgery time. ? Possible nerve injury during surgery. What are the signs or symptoms? Signs and symptoms in the affected hand, arm, foot, or leg are different for each stage. Signs and symptoms of stage 1 include:  Burning pain.  A prickling, tingling feeling (pins and needles sensation).  Extremely sensitive skin.  Swelling.  Joint stiffness.  Warmth and redness.  Excessive sweating.  Hair and nail growth that is faster than normal. Signs and symptoms of stage 2 include:  Spreading of pain to the whole arm or leg.  Increased skin sensitivity.  Increased swelling and  stiffness.  Coolness of the skin.  Blue discoloration of skin.  Loss of skin wrinkles.  Brittle fingernails. Signs and symptoms of stage 3 include:  Pain that spreads to other areas of the body but becomes less severe.  More stiffness, leading to loss of motion.  Skin that is pale, dry, shiny, or tightly stretched. How is this diagnosed? This condition may be diagnosed based on:  Your signs and symptoms.  A physical exam. There is no test to diagnose CRPS, but you may have tests:  To check for bone changes that might indicate CRPS. These tests may include an MRI or bone scan.  To rule out other possible causes of your symptoms. How is this treated? Early treatment may prevent CRPS from advancing past stage 1. There is not one treatment that works for everyone. Treatment options may include:  Medicines, which may include: ? NSAIDs, such as ibuprofen. ? Steroids. ? Blood pressure drugs. ? Antidepressants. ? Anti-seizure drugs. ? Pain relievers.  Exercise.  Occupational therapy (OT) and physical therapy (PT).  Biofeedback.  Mental health counseling.  Numbing injections.  Spinal surgery to implant a spinal cord stimulator or a pain pump. Follow these instructions at home: Medicines  Take over-the-counter and prescription medicines only as told by your health care provider.  Do not drive or use heavy machinery while taking prescription pain medicine.  If you are taking prescription pain medicine, take actions to prevent or treat constipation. Your health  care provider may recommend that you: ? Drink enough fluid to keep your urine pale yellow. ? Eat foods that are high in fiber, such as fresh fruits and vegetables, whole grains, and beans. ? Limit foods that are high in fat and processed sugars, such as fried or sweet foods. ? Take an over-the-counter or prescription medicine for constipation. General instructions  Do not use any products that contain  nicotine or tobacco, such as cigarettes and e-cigarettes. If you need help quitting, ask your health care provider.  Maintain a healthy weight.  Return to your normal activities as told by your health care provider. Ask your health care provider what activities are safe for you.  Follow an exercise program as directed by your health care provider.  Keep all follow-up visits as told by your health care provider. This is important. Contact a health care provider if:  Your symptoms change.  Your symptoms get worse.  You develop anxiety or depression. Summary  Complex regional pain syndrome (CRPS) is a nerve disorder that causes long-term (chronic) pain, usually in a hand, arm, leg, or foot.  CRPS usually occurs after an injury or trauma, such as a fracture or sprain.  CRPS ranges from mild to severe. Early treatment may prevent CRPS from advancing to more severe stages. This information is not intended to replace advice given to you by your health care provider. Make sure you discuss any questions you have with your health care provider. Document Released: 11/15/2002 Document Revised: 10/20/2017 Document Reviewed: 10/20/2017 Elsevier Interactive Patient Education  2019 [employer].

## 2019-02-04 NOTE — Progress Notes (Signed)
   CC: back pain  HPI  Her back and leg pain have not changed. She is sleeping with a heating pad. She didn't pick up the pain gel. She has not been taking her gabapentin. She has a nurse 3 hours a day who helps her to a handicap shower. She wants a back brace, demands that I make her insurance pay for this. If that is not possible, she demands a referral to orthopedics to get a back brace. She wonders whether we should get another picture of her back to check on it. She walks quickly with a cane.   Patient is tangential, not unusual for her, and gets easily frustrated if she is not seen immediately.   ROS: Denies CP, SOB, abdominal pain, dysuria, changes in BMs.   CC, SH/smoking status, and VS noted  Objective: Ht 5\' 7"  (1.702 m)   Wt 160 lb (72.6 kg)   BMI 25.06 kg/m  Gen: NAD, alert, tangential.  HEENT: NCAT, EOMI, PERRL CV: RRR, no murmur Resp: CTAB, no wheezes, non-labored Abd: SNTND, BS present, no guarding or organomegaly Ext: No edema, warm Neuro: Alert and oriented, Speech clear, No gross deficits  Assessment and plan:  Complex regional pain syndrome type 1 of lower extremity (Right) Patient has not taken her gabapentin, has not picked it up at all. States she is planning to pick this up today. No new red flags, I encouraged her to pick up medication and recheck in 1 month. Did not write Rx for back brace, and I counseled her that an orthopedics referral for a back brace would not be appropriate.    No orders of the defined types were placed in this encounter.   Meds ordered this encounter  Medications  . nicotine (NICODERM CQ - DOSED IN MG/24 HOURS) 14 mg/24hr patch    Sig: Place 1 patch (14 mg total) onto the skin daily.    Dispense:  28 patch    Refill:  2    Ralene Ok, MD, PGY3 02/08/2019 6:56 AM

## 2019-02-05 DIAGNOSIS — E782 Mixed hyperlipidemia: Secondary | ICD-10-CM | POA: Diagnosis not present

## 2019-02-06 DIAGNOSIS — E782 Mixed hyperlipidemia: Secondary | ICD-10-CM | POA: Diagnosis not present

## 2019-02-07 DIAGNOSIS — E782 Mixed hyperlipidemia: Secondary | ICD-10-CM | POA: Diagnosis not present

## 2019-02-08 ENCOUNTER — Telehealth: Payer: Self-pay

## 2019-02-08 ENCOUNTER — Other Ambulatory Visit: Payer: Self-pay

## 2019-02-08 DIAGNOSIS — E782 Mixed hyperlipidemia: Secondary | ICD-10-CM | POA: Diagnosis not present

## 2019-02-08 NOTE — Telephone Encounter (Signed)
Received fax from [place] requesting prior authorization of Nicoderm CQ.  Form placed in MD's box for completion along with Medicaid formulary.  Esau Grew, RN

## 2019-02-08 NOTE — Assessment & Plan Note (Signed)
Patient has not taken her gabapentin, has not picked it up at all. States she is planning to pick this up today. No new red flags, I encouraged her to pick up medication and recheck in 1 month. Did not write Rx for back brace, and I counseled her that an orthopedics referral for a back brace would not be appropriate.

## 2019-02-09 ENCOUNTER — Other Ambulatory Visit: Payer: Self-pay | Admitting: Family Medicine

## 2019-02-09 DIAGNOSIS — E782 Mixed hyperlipidemia: Secondary | ICD-10-CM | POA: Diagnosis not present

## 2019-02-09 MED ORDER — GABAPENTIN 300 MG PO CAPS: 300.0000 mg | ORAL_CAPSULE | Freq: Three times a day (TID) | ORAL | 1 refills | Status: DC

## 2019-02-09 NOTE — Telephone Encounter (Signed)
Pt is calling and said that she was seen on Friday and was suppose to have a cream sent to the pharmacy for her back and another medication as well.   I asked her if she knew the name of the medications and she said "I don't know the doctor knows"   Pt would like for Dr. Lindell Noe to call her.

## 2019-02-10 DIAGNOSIS — E782 Mixed hyperlipidemia: Secondary | ICD-10-CM | POA: Diagnosis not present

## 2019-02-10 MED ORDER — NICOTINE 14 MG/24HR TD PT24: 14.0000 mg | MEDICATED_PATCH | Freq: Every day | TRANSDERMAL | 0 refills | Status: DC

## 2019-02-10 NOTE — Telephone Encounter (Signed)
Lindell Noe stated she sent in a generic patch that her insurance will cover. Pt has been informed.

## 2019-02-10 NOTE — Telephone Encounter (Signed)
Pt contacted and informed of below. Pt states she has still not picked up her gabapentin, however she plans to today.

## 2019-02-10 NOTE — Telephone Encounter (Signed)
Please let patient know that I sent the generic nicotine patch, which medicaid should cover. She should still have voltaren gel and refills at the [facility] on [address] - this was sent on 01/13/2019.

## 2019-02-11 DIAGNOSIS — E782 Mixed hyperlipidemia: Secondary | ICD-10-CM | POA: Diagnosis not present

## 2019-02-12 DIAGNOSIS — E782 Mixed hyperlipidemia: Secondary | ICD-10-CM | POA: Diagnosis not present

## 2019-02-13 DIAGNOSIS — E782 Mixed hyperlipidemia: Secondary | ICD-10-CM | POA: Diagnosis not present

## 2019-02-14 DIAGNOSIS — E782 Mixed hyperlipidemia: Secondary | ICD-10-CM | POA: Diagnosis not present

## 2019-02-15 ENCOUNTER — Ambulatory Visit: Payer: Medicaid Other | Admitting: Family Medicine

## 2019-02-15 DIAGNOSIS — E782 Mixed hyperlipidemia: Secondary | ICD-10-CM | POA: Diagnosis not present

## 2019-02-16 DIAGNOSIS — E782 Mixed hyperlipidemia: Secondary | ICD-10-CM | POA: Diagnosis not present

## 2019-02-17 DIAGNOSIS — E782 Mixed hyperlipidemia: Secondary | ICD-10-CM | POA: Diagnosis not present

## 2019-02-18 DIAGNOSIS — E782 Mixed hyperlipidemia: Secondary | ICD-10-CM | POA: Diagnosis not present

## 2019-02-19 DIAGNOSIS — E782 Mixed hyperlipidemia: Secondary | ICD-10-CM | POA: Diagnosis not present

## 2019-02-20 DIAGNOSIS — E782 Mixed hyperlipidemia: Secondary | ICD-10-CM | POA: Diagnosis not present

## 2019-02-21 DIAGNOSIS — E782 Mixed hyperlipidemia: Secondary | ICD-10-CM | POA: Diagnosis not present

## 2019-02-22 ENCOUNTER — Telehealth: Payer: Self-pay | Admitting: Family Medicine

## 2019-02-22 DIAGNOSIS — E782 Mixed hyperlipidemia: Secondary | ICD-10-CM | POA: Diagnosis not present

## 2019-02-22 NOTE — Telephone Encounter (Signed)
Called patient back. She says "I am alright." She has a cough, and couldn't find her albuterol for 3 days. She says her breathing is not a problem right now and she is "better." She says her temp was yesterday 98 or 100. Her nurses' name is Ms. Edwena Felty and she will have her nurse call me today if possible to discuss her visit. She says she has masks and lysol and will be staying home alone. I advised her that it does not sound like she needs to be seen today and we would like for her to stay home to avoid catching any colds or viruses in our office. She says if she is not better by Thursday she may go to [facility]. She appreciated the call. She was speaking in long sentences without noticeable SOB of note.

## 2019-02-22 NOTE — Telephone Encounter (Signed)
Routed message by access staff regarding patient's concern for fevers.  The patient reports her breathing is doing well.  She has had in her baseline cough.  She is using her inhalers as indicated.  She reports she may have had a fever on Sunday as measured by her home health nurse.  She is eating and drinking well and has no other symptoms.  I tried to get the patient to elaborate on her symptoms more.  The patient requested that her PCP call her sometime this week.  She also requested an appointment for Thursday afternoon.  Would strongly consider recommending patient stay home if she is feeling well.  If she is truly having respiratory symptoms or fevers she would need to be seen.  Dr. Lindell Noe-- please see me to discuss.   Thanks! Dorris Singh, MD  Family Medicine Teaching Service

## 2019-02-23 DIAGNOSIS — E782 Mixed hyperlipidemia: Secondary | ICD-10-CM | POA: Diagnosis not present

## 2019-02-23 NOTE — Telephone Encounter (Signed)
Called patient back. "I'm doing great." She is "fine now." She is not coughing anymore and using her inhalers. She says at first she wondered if she should be tested for COVID. She does think that she might have an ear infection and or yeast infection. Wants me to see her next week, I will see her on Monday.

## 2019-02-23 NOTE — Telephone Encounter (Signed)
Pt is calling and would like to know if she would be able to have a referral to be swabbed at the drive through clinic.   Pt would like for someone to call her to discuss what is best for her to do.

## 2019-02-24 DIAGNOSIS — E782 Mixed hyperlipidemia: Secondary | ICD-10-CM | POA: Diagnosis not present

## 2019-02-25 DIAGNOSIS — E782 Mixed hyperlipidemia: Secondary | ICD-10-CM | POA: Diagnosis not present

## 2019-02-26 DIAGNOSIS — E782 Mixed hyperlipidemia: Secondary | ICD-10-CM | POA: Diagnosis not present

## 2019-02-27 DIAGNOSIS — E782 Mixed hyperlipidemia: Secondary | ICD-10-CM | POA: Diagnosis not present

## 2019-02-28 DIAGNOSIS — E782 Mixed hyperlipidemia: Secondary | ICD-10-CM | POA: Diagnosis not present

## 2019-03-01 ENCOUNTER — Other Ambulatory Visit: Payer: Self-pay

## 2019-03-01 ENCOUNTER — Ambulatory Visit (INDEPENDENT_AMBULATORY_CARE_PROVIDER_SITE_OTHER): Payer: Medicaid Other | Admitting: Family Medicine

## 2019-03-01 ENCOUNTER — Encounter: Payer: Self-pay | Admitting: Family Medicine

## 2019-03-01 VITALS — BP 122/80 | HR 97 | Temp 98.5°F | Wt 160.6 lb

## 2019-03-01 DIAGNOSIS — H938X3 Other specified disorders of ear, bilateral: Secondary | ICD-10-CM

## 2019-03-01 DIAGNOSIS — R05 Cough: Secondary | ICD-10-CM

## 2019-03-01 DIAGNOSIS — E782 Mixed hyperlipidemia: Secondary | ICD-10-CM | POA: Diagnosis not present

## 2019-03-01 DIAGNOSIS — R059 Cough, unspecified: Secondary | ICD-10-CM

## 2019-03-01 MED ORDER — ALBUTEROL SULFATE HFA 108 (90 BASE) MCG/ACT IN AERS: 2.0000 | INHALATION_SPRAY | RESPIRATORY_TRACT | 0 refills | Status: DC | PRN

## 2019-03-01 MED ORDER — FAMOTIDINE 20 MG PO TABS: 20.0000 mg | ORAL_TABLET | Freq: Two times a day (BID) | ORAL | 0 refills | Status: DC

## 2019-03-01 NOTE — Progress Notes (Signed)
   CC: ear fullness, inhaler refill/cough  HPI  Patient is tangential, which is not abnormal for her but does make history taking difficult.  She reports some ear fullness bilaterally and wonders whether she has a wax impaction.  She has had this before.  She denies any pain.  She has not tried any drops.  No discharge.  This has been going on for several weeks.  She notes decreased hearing bilaterally.  Cough: This was discussed on her telephone visit last week as well, no fever.  She never picked up her albuterol as she told me she was going to last week.  She is not sure if she has extra of her "pump" at home.  She is still smoking occasionally.  She does not take any acid medicine.  She does note some heartburn occasionally.  ROS: Denies CP, SOB, abdominal pain, dysuria, changes in BMs.   CC, SH/smoking status, and VS noted  Objective: BP 122/80   Pulse 97   Temp 98.5 F (36.9 C) (Oral)   Wt 160 lb 9.6 oz (72.8 kg)   SpO2 98%   BMI 25.15 kg/m  Gen: NAD, alert, tangential. HEENT: NCAT, EOMI, PERRL, large wax burden of bilateral canals, upper half of TM visualized and normal. CV: RRR, no murmur Resp: CTAB, no wheezes, non-labored Ext: No edema, warm Neuro: Alert and oriented, Speech clear, No gross deficits  Assessment and plan:  Ear fullness: CMA washed out both ears, but patient left before I could reassess them.  TMs were partially visualized and clear.  Cough: Patient did not pick up her inhaler as she previously told me she would.  No wheezing on exam.  I sent a refill of her inhaler just in case.  I also asked her to try Pepcid for possible GERD component of this cough.  No suspicion for infectious etiology at this point.   Meds ordered this encounter  Medications  . albuterol (PROVENTIL HFA;VENTOLIN HFA) 108 (90 Base) MCG/ACT inhaler    Sig: Inhale 2 puffs into the lungs every 4 (four) hours as needed for wheezing or shortness of breath.    Dispense:  1 Inhaler   Refill:  0  . famotidine (PEPCID) 20 MG tablet    Sig: Take 1 tablet (20 mg total) by mouth 2 (two) times daily.    Dispense:  60 tablet    Refill:  0     Ralene Ok, MD, PGY3 03/02/2019 1:58 PM

## 2019-03-01 NOTE — Patient Instructions (Signed)
It was a pleasure to see you today! Thank you for choosing [facility] for your primary care. Rhonda Dominguez was seen for ear fullness, cough.   Our plans for today were:  Your lungs sound great today.   Please use your inhalers as directed and stay home. Call if you have concerns.   We cleaned out your ears.   I sent you a prescription for an acid reducing medicine to help with your cough.  Call back in a month or so and make an appt with me in 3 months and Dr. Valentina Lucks as needed.   Best,  Dr. Lindell Noe

## 2019-03-02 DIAGNOSIS — E782 Mixed hyperlipidemia: Secondary | ICD-10-CM | POA: Diagnosis not present

## 2019-03-03 DIAGNOSIS — E782 Mixed hyperlipidemia: Secondary | ICD-10-CM | POA: Diagnosis not present

## 2019-03-04 DIAGNOSIS — E782 Mixed hyperlipidemia: Secondary | ICD-10-CM | POA: Diagnosis not present

## 2019-03-05 DIAGNOSIS — E782 Mixed hyperlipidemia: Secondary | ICD-10-CM | POA: Diagnosis not present

## 2019-03-06 DIAGNOSIS — E782 Mixed hyperlipidemia: Secondary | ICD-10-CM | POA: Diagnosis not present

## 2019-03-07 DIAGNOSIS — E782 Mixed hyperlipidemia: Secondary | ICD-10-CM | POA: Diagnosis not present

## 2019-03-08 DIAGNOSIS — E782 Mixed hyperlipidemia: Secondary | ICD-10-CM | POA: Diagnosis not present

## 2019-03-09 DIAGNOSIS — E782 Mixed hyperlipidemia: Secondary | ICD-10-CM | POA: Diagnosis not present

## 2019-03-10 DIAGNOSIS — E782 Mixed hyperlipidemia: Secondary | ICD-10-CM | POA: Diagnosis not present

## 2019-03-11 DIAGNOSIS — E782 Mixed hyperlipidemia: Secondary | ICD-10-CM | POA: Diagnosis not present

## 2019-03-12 DIAGNOSIS — E782 Mixed hyperlipidemia: Secondary | ICD-10-CM | POA: Diagnosis not present

## 2019-03-13 DIAGNOSIS — E782 Mixed hyperlipidemia: Secondary | ICD-10-CM | POA: Diagnosis not present

## 2019-03-14 DIAGNOSIS — E782 Mixed hyperlipidemia: Secondary | ICD-10-CM | POA: Diagnosis not present

## 2019-03-15 ENCOUNTER — Telehealth: Payer: Self-pay | Admitting: Family Medicine

## 2019-03-15 DIAGNOSIS — E782 Mixed hyperlipidemia: Secondary | ICD-10-CM | POA: Diagnosis not present

## 2019-03-15 DIAGNOSIS — I10 Essential (primary) hypertension: Secondary | ICD-10-CM

## 2019-03-15 DIAGNOSIS — J984 Other disorders of lung: Secondary | ICD-10-CM

## 2019-03-15 NOTE — Telephone Encounter (Signed)
Pt requesting a call back from [place]. She says her breathing has gotten a lot worse and she does not want anyone else calling her, only [place].

## 2019-03-15 NOTE — Telephone Encounter (Signed)
Attempted to call X2 and left message X1  Plan to call again in the AM. 4/7

## 2019-03-16 ENCOUNTER — Telehealth: Payer: Self-pay | Admitting: *Deleted

## 2019-03-16 DIAGNOSIS — E782 Mixed hyperlipidemia: Secondary | ICD-10-CM | POA: Diagnosis not present

## 2019-03-16 MED ORDER — TIOTROPIUM BROMIDE-OLODATEROL 2.5-2.5 MCG/ACT IN AERS: 2.0000 | INHALATION_SPRAY | Freq: Every day | RESPIRATORY_TRACT | 5 refills | Status: DC

## 2019-03-16 MED ORDER — AMLODIPINE BESYLATE 10 MG PO TABS: 10.0000 mg | ORAL_TABLET | Freq: Every day | ORAL | 2 refills | Status: DC

## 2019-03-16 MED ORDER — HYDROCHLOROTHIAZIDE 25 MG PO TABS: 25.0000 mg | ORAL_TABLET | Freq: Every day | ORAL | 3 refills | Status: DC

## 2019-03-16 MED ORDER — FLUTICASONE-UMECLIDIN-VILANT 100-62.5-25 MCG/INH IN AEPB: 1.0000 | INHALATION_SPRAY | Freq: Every day | RESPIRATORY_TRACT | 3 refills | Status: DC

## 2019-03-16 MED ORDER — NORTRIPTYLINE HCL 25 MG PO CAPS: 25.0000 mg | ORAL_CAPSULE | Freq: Every day | ORAL | 2 refills | Status: DC

## 2019-03-16 MED ORDER — FAMOTIDINE 20 MG PO TABS: 20.0000 mg | ORAL_TABLET | Freq: Two times a day (BID) | ORAL | 2 refills | Status: DC

## 2019-03-16 NOTE — Telephone Encounter (Signed)
Trelegy NOT preferred by Medicaid.   Attempt use of STIOLTO Respimat as alternative which appears covered by [place] Medicaid PDL   This LABA/LAMA inhaler combination should be a reasonable option.   New Rx Sent to Pharmacy

## 2019-03-16 NOTE — Telephone Encounter (Signed)
Noted and agree. 

## 2019-03-16 NOTE — Telephone Encounter (Signed)
Trelegy is not preferred on medicaid formulary.  See below:     Let me know if you want to change med or pursue a PA. Christen Bame, CMA

## 2019-03-16 NOTE — Telephone Encounter (Signed)
Follow-Up from call 4/6  Patient reported QUIT SMOKING ~ 1 month prior.  I congratulated her on success with quitting.   She states she has been using her albuterol 2 puffs twice daily.  She denies use of any other inhaler medications at this time.   She was willing to restart Trelegy Ellipta inhaler once daily.   Refill prescriptions sent for HCTZ, amlodipine, nortriptyline, famotidine (which is reported to help) and Trelegy inhaler.   She desires a lung function test assessment.  We discussed that we would not be rechecking lung function in the next 1-2 months.   She was encouraged to take her medications as prescribed - once daily Trelegy and PRN albuterol.   I plan to call her in F/u in ~ 1 week to reassess her adherence and control with above plan.

## 2019-03-17 DIAGNOSIS — E782 Mixed hyperlipidemia: Secondary | ICD-10-CM | POA: Diagnosis not present

## 2019-03-18 ENCOUNTER — Ambulatory Visit: Payer: Medicaid Other | Admitting: Family Medicine

## 2019-03-18 DIAGNOSIS — E782 Mixed hyperlipidemia: Secondary | ICD-10-CM | POA: Diagnosis not present

## 2019-03-19 DIAGNOSIS — E782 Mixed hyperlipidemia: Secondary | ICD-10-CM | POA: Diagnosis not present

## 2019-03-20 DIAGNOSIS — E782 Mixed hyperlipidemia: Secondary | ICD-10-CM | POA: Diagnosis not present

## 2019-03-21 DIAGNOSIS — E782 Mixed hyperlipidemia: Secondary | ICD-10-CM | POA: Diagnosis not present

## 2019-03-22 DIAGNOSIS — E782 Mixed hyperlipidemia: Secondary | ICD-10-CM | POA: Diagnosis not present

## 2019-03-23 DIAGNOSIS — E782 Mixed hyperlipidemia: Secondary | ICD-10-CM | POA: Diagnosis not present

## 2019-03-24 DIAGNOSIS — E782 Mixed hyperlipidemia: Secondary | ICD-10-CM | POA: Diagnosis not present

## 2019-03-25 ENCOUNTER — Telehealth: Payer: Self-pay | Admitting: Pharmacist

## 2019-03-25 ENCOUNTER — Telehealth: Payer: Self-pay | Admitting: Family Medicine

## 2019-03-25 ENCOUNTER — Ambulatory Visit: Payer: Medicaid Other

## 2019-03-25 DIAGNOSIS — E782 Mixed hyperlipidemia: Secondary | ICD-10-CM | POA: Diagnosis not present

## 2019-03-25 DIAGNOSIS — G8929 Other chronic pain: Secondary | ICD-10-CM

## 2019-03-25 DIAGNOSIS — M5440 Lumbago with sciatica, unspecified side: Principal | ICD-10-CM

## 2019-03-25 NOTE — Telephone Encounter (Signed)
Pt called nurse line requesting to speak with PCP. Side note patient is rude and abrasive. I informed her PCP has been on nights and I could have her speak with a telemedicine provider today. Patient did not want to do this, "I only want to speak with MY DOCTOR." Fine. Please call her when you can. She also rudely asked for me to contact her lung MD, which I will not.

## 2019-03-25 NOTE — Telephone Encounter (Signed)
Called patient back. She says she just talked to Dr. Valentina Lucks and he was helpful with her inhaler instructions. She wanted a life alert, and I told her that she could call the company to see if insurance would cover it. Gave her the phone number from [place]. She wanted to see me in clinic again soon, asked her to call back in the first week of May. Again reviewed with her the etiology of her chronic leg pain. She is insistent that she see a back doctor again, will place that referral.

## 2019-03-25 NOTE — Telephone Encounter (Signed)
Called patient and discussed her medications.   Clarified use of blood pressure pills (amlodipine, hctz) daily AND her famotidine 1-2 x day.   Discussed the nortriptyline at night.   Reviewed administration technique of Trelegy inhaler AND patient successfully administered first dose over the phone.  Clarified dose is 1 inhalation daily and that albuterol was 2 puffs PRN.   Patient was concerned over leg pain / nerve pain which I shared may be helped if she takes her nortriptyline every day.   She also requested help with requesting "life-alert".  I informed her I would share this request her Dr. Lindell Noe for future consideration.    She was thankful for the call and we discussed additional f/u in 1 week by phone.  I plan to call her to encouraged continued abstinence from smoking and reassess med adherence.

## 2019-03-25 NOTE — Telephone Encounter (Signed)
Pt called stating that her back, and toe is giving her pain. Pt states pain in her toe is traveling up to her thigh, and would like to speak with her PCP. Please give pt a call back.

## 2019-03-25 NOTE — Telephone Encounter (Signed)
Noted and agree. 

## 2019-03-26 DIAGNOSIS — E782 Mixed hyperlipidemia: Secondary | ICD-10-CM | POA: Diagnosis not present

## 2019-03-27 DIAGNOSIS — E782 Mixed hyperlipidemia: Secondary | ICD-10-CM | POA: Diagnosis not present

## 2019-03-28 DIAGNOSIS — E782 Mixed hyperlipidemia: Secondary | ICD-10-CM | POA: Diagnosis not present

## 2019-03-29 DIAGNOSIS — E782 Mixed hyperlipidemia: Secondary | ICD-10-CM | POA: Diagnosis not present

## 2019-03-30 DIAGNOSIS — E782 Mixed hyperlipidemia: Secondary | ICD-10-CM | POA: Diagnosis not present

## 2019-03-31 DIAGNOSIS — E782 Mixed hyperlipidemia: Secondary | ICD-10-CM | POA: Diagnosis not present

## 2019-04-01 ENCOUNTER — Telehealth: Payer: Self-pay | Admitting: Pharmacist

## 2019-04-01 DIAGNOSIS — E782 Mixed hyperlipidemia: Secondary | ICD-10-CM | POA: Diagnosis not present

## 2019-04-01 NOTE — Telephone Encounter (Signed)
Patient contacted in follow up of tobacco cessation and breathing issues.   After identifying myself the patient blurted out that she has all of her medications, she is taking all of her medication, she is breathing great AND she is sleeping through the night with her "sleeping pill" (nortriptyline).    She states she remains in quarantine at this time.  She has limited access to others (just her home health aid).   She was thankful for the call from her doctor, Dr. Lindell Noe and told me to "let Dr. Valentina Lucks know she was doing better".    Total time in phone conversation/ contact < 1 minute.  I will not plan to follow-up any more at this time as the patient appears to be doing well.  Thanks for sharing her care.

## 2019-04-02 DIAGNOSIS — E782 Mixed hyperlipidemia: Secondary | ICD-10-CM | POA: Diagnosis not present

## 2019-04-03 DIAGNOSIS — E782 Mixed hyperlipidemia: Secondary | ICD-10-CM | POA: Diagnosis not present

## 2019-04-04 DIAGNOSIS — E782 Mixed hyperlipidemia: Secondary | ICD-10-CM | POA: Diagnosis not present

## 2019-04-05 DIAGNOSIS — E782 Mixed hyperlipidemia: Secondary | ICD-10-CM | POA: Diagnosis not present

## 2019-04-06 DIAGNOSIS — E782 Mixed hyperlipidemia: Secondary | ICD-10-CM | POA: Diagnosis not present

## 2019-04-07 ENCOUNTER — Encounter (INDEPENDENT_AMBULATORY_CARE_PROVIDER_SITE_OTHER): Payer: Self-pay | Admitting: Orthopaedic Surgery

## 2019-04-07 ENCOUNTER — Ambulatory Visit (INDEPENDENT_AMBULATORY_CARE_PROVIDER_SITE_OTHER): Payer: Medicaid Other | Admitting: Orthopaedic Surgery

## 2019-04-07 ENCOUNTER — Other Ambulatory Visit: Payer: Self-pay

## 2019-04-07 VITALS — Ht 67.0 in | Wt 160.0 lb

## 2019-04-07 DIAGNOSIS — E782 Mixed hyperlipidemia: Secondary | ICD-10-CM | POA: Diagnosis not present

## 2019-04-07 DIAGNOSIS — G8929 Other chronic pain: Secondary | ICD-10-CM

## 2019-04-07 DIAGNOSIS — M545 Low back pain, unspecified: Secondary | ICD-10-CM

## 2019-04-07 NOTE — Progress Notes (Signed)
Office Visit Note   Patient: Rhonda Dominguez           Date of Birth: April 07, 1961           MRN: [id number] Visit Date: 04/07/2019              Requested by: Sela Hilding, MD [address] [place], [place] [address] PCP: Sela Hilding, MD   Assessment & Plan: Visit Diagnoses:  1. Chronic bilateral low back pain, unspecified whether sciatica present     Plan: We will set patient up for some physical therapy.  She states she like to wait and come back in 2 months for recheck.  Previous image results were reviewed with her including cervical spine CT scan from several years ago.  Recent lumbar x-rays which showed mild curvature negative for acute changes.  Follow-Up Instructions: Return in about 2 months (around 06/07/2019).   Orders:  Orders Placed This Encounter  Procedures  . Ambulatory referral to Physical Therapy   No orders of the defined types were placed in this encounter.     Procedures: No procedures performed   Clinical Data: No additional findings.   Subjective: Chief Complaint  Patient presents with  . Lower Back - Pain    HPI 58 year old female seen with back pain this been progressive over the last 2 months.  She states she has a nurse that comes out for 2 hours 7 days a week to help her at home.  She states her back pain is getting worse and radiates down to her leg.  She had 2 procedures done over her foot by podiatrist with the incision over the first metatarsal and she was told by the podiatrist that the pain must be coming from her back.  She states she cannot sit long states her pain is severe.  She talked to her PCP and demanded a brace for her back.  She ambulates slowly with a deliberate gait.  She gets rapidly from sitting to standing but then walks extremely slowly.  Negative logroll to the hips negative straight leg raising 90 degrees knee and ankle jerk are 2+ and symmetrical.  She has old incision from likely lipoma removal right  thigh.  Incision over the first metatarsal shaft well-healed.  Ankle dorsiflexion plantarflexion is good.  Review of Systems patient's poor historian positive bipolar affective disorder patient is very talkative.  She relates a history of having a nerve problem in her brain.  Positive history of cocaine abuse nicotine dependence.,  Cataracts, complaints of chronic pain.  Otherwise negative is a pertains to HPI.  Head CT 2011 normal.CT cervical normal 2011.    Objective: Vital Signs: Ht 5\' 7"  (1.702 m)   Wt 160 lb (72.6 kg)   BMI 25.06 kg/m   Physical Exam Constitutional:      Appearance: She is well-developed.  HENT:     Head: Normocephalic.     Right Ear: External ear normal.     Left Ear: External ear normal.  Eyes:     Pupils: Pupils are equal, round, and reactive to light.  Neck:     Thyroid: No thyromegaly.     Trachea: No tracheal deviation.  Cardiovascular:     Rate and Rhythm: Normal rate.  Pulmonary:     Effort: Pulmonary effort is normal.  Abdominal:     Palpations: Abdomen is soft.  Skin:    General: Skin is warm and dry.  Neurological:     Mental Status: She  is alert and oriented to person, place, and time.  Psychiatric:        Behavior: Behavior normal.     Ortho Exam patient has well-healed incision of the first metatarsal right foot.  Old scar right thigh well-healed with slight dimple possibly a subcutaneous lipoma.  Quad strength is good negative logroll to the hips.  She gets rapidly from sitting to standing but then when she stands up she has very slow deliberate ambulation.  Anterior tib gastrocsoleus is strong.  Distal pulses are intact negative [place].  Knees reach full extension no crepitus.  Negative straight leg raising right and left at 90 degrees.  Specialty Comments:  No specialty comments available.  Imaging: CLINICAL DATA:  58 year old female. No injury. Chronic right leg pain and right flank pain. Initial encounter.  EXAM: LUMBAR SPINE -  COMPLETE 4+ VIEW  COMPARISON:  04/11/2010.  FINDINGS: Mild scoliosis lumbar spine convex right. No compression fracture or pars defect.  Minimal L4-5 and L5-S1 disc space narrowing.  Vascular calcifications.  IMPRESSION: 1. Mild scoliosis lumbar spine convex right. No compression fracture or pars defect. 2. Minimal L4-5 and L5-S1 disc space narrowing. 3.  Aortic Atherosclerosis (ICD10-I70.0).   Electronically Signed   By: Genia Del M.D.   On: 01/05/2019 17:05 Images and report reviewed with patient.   PMFS History: Patient Active Problem List   Diagnosis Date Noted  . Rash and nonspecific skin eruption 10/16/2018  . Elevated C-reactive protein (CRP) 11/10/2017  . Vitamin D deficiency 11/10/2017  . Complex regional pain syndrome type 1 of lower extremity (Right) 11/10/2017  . Cocaine abuse ([place]) 11/10/2017  . Problems influencing health status 11/10/2017  . Nicotine dependence 11/10/2017  . Osteoarthritis 11/10/2017  . NSAID long-term use 11/10/2017  . Neurogenic pain 11/10/2017  . Chronic shoulder pain (Bilateral) (L>R) 11/10/2017  . Dextroscoliosis 11/10/2017  . DDD (degenerative disc disease), cervical 11/10/2017  . Knee pain, chronic (Secondary Area of Pain) (right) 11/09/2017  . Opiate use 10/23/2017  . Great toe pain (Primary Area of Pain) (Right) 10/23/2017  . Chronic lower extremity pain [facility] Area of Pain) (Right) 10/23/2017  . Chronic pain syndrome 10/23/2017  . Disorder of skeletal system 10/23/2017  . Other reduced mobility 10/23/2017  . Urinary incontinence 11/20/2016  . Restrictive lung disease 07/23/2016  . Toe pain 04/13/2016  . Essential hypertension 03/22/2016  . Pharmacologic therapy 12/14/2015  . High risk bisexual behavior 05/18/2015  . CATARACTS, BILATERAL 06/24/2007  . Bipolar affective disorder ([place]) 10/03/2006  . CONSTIPATION 10/03/2006  . Eczema 10/03/2006   Past Medical History:  Diagnosis Date  . Depression   .  Diabetes mellitus without complication ([place])   . Drug abuse ([place])   . Hypertension   . Pneumothorax     No family history on file.  No past surgical history on file. Social History   Occupational History  . Not on file  Tobacco Use  . Smoking status: Former Research scientist (life sciences)  . Smokeless tobacco: Never Used  . Tobacco comment: one cig a day  Substance and Sexual Activity  . Alcohol use: No  . Drug use: No  . Sexual activity: Not on file

## 2019-04-08 DIAGNOSIS — E782 Mixed hyperlipidemia: Secondary | ICD-10-CM | POA: Diagnosis not present

## 2019-04-09 DIAGNOSIS — E782 Mixed hyperlipidemia: Secondary | ICD-10-CM | POA: Diagnosis not present

## 2019-04-10 DIAGNOSIS — E782 Mixed hyperlipidemia: Secondary | ICD-10-CM | POA: Diagnosis not present

## 2019-04-11 DIAGNOSIS — E782 Mixed hyperlipidemia: Secondary | ICD-10-CM | POA: Diagnosis not present

## 2019-04-12 DIAGNOSIS — E782 Mixed hyperlipidemia: Secondary | ICD-10-CM | POA: Diagnosis not present

## 2019-04-13 DIAGNOSIS — E782 Mixed hyperlipidemia: Secondary | ICD-10-CM | POA: Diagnosis not present

## 2019-04-14 DIAGNOSIS — E782 Mixed hyperlipidemia: Secondary | ICD-10-CM | POA: Diagnosis not present

## 2019-04-15 DIAGNOSIS — E782 Mixed hyperlipidemia: Secondary | ICD-10-CM | POA: Diagnosis not present

## 2019-04-16 DIAGNOSIS — E782 Mixed hyperlipidemia: Secondary | ICD-10-CM | POA: Diagnosis not present

## 2019-04-17 DIAGNOSIS — E782 Mixed hyperlipidemia: Secondary | ICD-10-CM | POA: Diagnosis not present

## 2019-04-18 DIAGNOSIS — E782 Mixed hyperlipidemia: Secondary | ICD-10-CM | POA: Diagnosis not present

## 2019-04-19 ENCOUNTER — Telehealth: Payer: Self-pay | Admitting: Family Medicine

## 2019-04-19 DIAGNOSIS — E782 Mixed hyperlipidemia: Secondary | ICD-10-CM | POA: Diagnosis not present

## 2019-04-19 NOTE — Telephone Encounter (Signed)
Pt called requesting a call back from her PCP about a referral. Please give pt a call back.

## 2019-04-20 DIAGNOSIS — E782 Mixed hyperlipidemia: Secondary | ICD-10-CM | POA: Diagnosis not present

## 2019-04-21 ENCOUNTER — Telehealth (INDEPENDENT_AMBULATORY_CARE_PROVIDER_SITE_OTHER): Payer: Medicaid Other | Admitting: Family Medicine

## 2019-04-21 ENCOUNTER — Other Ambulatory Visit: Payer: Self-pay

## 2019-04-21 DIAGNOSIS — M159 Polyosteoarthritis, unspecified: Secondary | ICD-10-CM

## 2019-04-21 DIAGNOSIS — E782 Mixed hyperlipidemia: Secondary | ICD-10-CM | POA: Diagnosis not present

## 2019-04-21 DIAGNOSIS — F17210 Nicotine dependence, cigarettes, uncomplicated: Secondary | ICD-10-CM | POA: Diagnosis not present

## 2019-04-21 DIAGNOSIS — G894 Chronic pain syndrome: Secondary | ICD-10-CM | POA: Diagnosis not present

## 2019-04-21 DIAGNOSIS — M150 Primary generalized (osteo)arthritis: Secondary | ICD-10-CM

## 2019-04-21 MED ORDER — GABAPENTIN 300 MG PO CAPS: 300.0000 mg | ORAL_CAPSULE | Freq: Three times a day (TID) | ORAL | 1 refills | Status: DC

## 2019-04-21 NOTE — Progress Notes (Signed)
[name] Telemedicine Visit  Patient consented to have virtual visit. Method of visit: Telephone  Encounter participants: Patient: Rhonda Dominguez - located at home Provider: Ralene Ok - located at [facility]  Others (if applicable): none  Chief Complaint: back pain   HPI:  She saw Dr. Lorin Mercy, he wrote rx for PT. Unfortunately PT is not currently doing outpatient PT until next month due to [place]. She wonders when we are opening up. Her back feels somewhat worse, she is lying in the bed with her PJs on. She is worried about gallstones bc someone mentioned this to her. For her back, she is taking no meds. She occasionally uses voltaren gel. She needs more of her yellow pills. She needs a refill of her gabapentin. She wants me to have her an appt next month. She wants me to tell Dr. Valentina Lucks that she isn't smoking and to tell him hi. The pain is on the side of her back. No dysuria, no weakness.   ROS: per HPI  Pertinent PMHx: DDD, bipolar  Exam:  Respiratory: speaks in long sentences.   Assessment/Plan:  Back pain: c/w w previous pain. Can't get to PT right now. Encouraged walking, use APAP as needed. Reassured her that I don't think we need more imaging right now.   Tobacco cessation - continue cessation, congratulated her.   Time spent during visit with patient: 8 minutes

## 2019-04-21 NOTE — Telephone Encounter (Signed)
I see patient is on my schedule for virtual visit today. Will discuss w her then.

## 2019-04-22 ENCOUNTER — Telehealth: Payer: Self-pay | Admitting: Pharmacist

## 2019-04-22 ENCOUNTER — Telehealth: Payer: Medicaid Other | Admitting: Pharmacist

## 2019-04-22 ENCOUNTER — Other Ambulatory Visit: Payer: Self-pay

## 2019-04-22 DIAGNOSIS — E782 Mixed hyperlipidemia: Secondary | ICD-10-CM | POA: Diagnosis not present

## 2019-04-22 NOTE — Telephone Encounter (Signed)
Patient scheduled for telephone "virtual visit".    Called twice between 8:30 and 9:00 AM - no answer  Left message requesting call back.   If I do not hear from her I will try to call her in the next 4 days to assess tobacco cessation and dyspnea.

## 2019-04-23 DIAGNOSIS — E782 Mixed hyperlipidemia: Secondary | ICD-10-CM | POA: Diagnosis not present

## 2019-04-24 DIAGNOSIS — E782 Mixed hyperlipidemia: Secondary | ICD-10-CM | POA: Diagnosis not present

## 2019-04-25 DIAGNOSIS — E782 Mixed hyperlipidemia: Secondary | ICD-10-CM | POA: Diagnosis not present

## 2019-04-26 DIAGNOSIS — E782 Mixed hyperlipidemia: Secondary | ICD-10-CM | POA: Diagnosis not present

## 2019-04-27 ENCOUNTER — Telehealth: Payer: Self-pay | Admitting: Pharmacist

## 2019-04-27 DIAGNOSIS — E782 Mixed hyperlipidemia: Secondary | ICD-10-CM | POA: Diagnosis not present

## 2019-04-27 NOTE — Telephone Encounter (Signed)
Patient reports doing well, taking all of her medications, not smoking, not going out of her apartment.   She states she has all of her supplies, like a "hospital".  She expresses concern over the Covid virus and appears committed to continued shelter in place behavior.   No concerns. She was appreciative of the call.   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   I will plan to call her again to assess tobacco cessation and breathing in 1-2 weeks.

## 2019-04-28 DIAGNOSIS — E782 Mixed hyperlipidemia: Secondary | ICD-10-CM | POA: Diagnosis not present

## 2019-04-29 DIAGNOSIS — E782 Mixed hyperlipidemia: Secondary | ICD-10-CM | POA: Diagnosis not present

## 2019-04-30 DIAGNOSIS — E782 Mixed hyperlipidemia: Secondary | ICD-10-CM | POA: Diagnosis not present

## 2019-05-01 DIAGNOSIS — E782 Mixed hyperlipidemia: Secondary | ICD-10-CM | POA: Diagnosis not present

## 2019-05-02 DIAGNOSIS — E782 Mixed hyperlipidemia: Secondary | ICD-10-CM | POA: Diagnosis not present

## 2019-05-03 DIAGNOSIS — E782 Mixed hyperlipidemia: Secondary | ICD-10-CM | POA: Diagnosis not present

## 2019-05-04 ENCOUNTER — Telehealth: Payer: Self-pay | Admitting: Family Medicine

## 2019-05-04 DIAGNOSIS — E782 Mixed hyperlipidemia: Secondary | ICD-10-CM | POA: Diagnosis not present

## 2019-05-04 NOTE — Telephone Encounter (Signed)
Pt needs a referral to [facility] for Pain, because of her back issues. jw

## 2019-05-05 DIAGNOSIS — E782 Mixed hyperlipidemia: Secondary | ICD-10-CM | POA: Diagnosis not present

## 2019-05-05 NOTE — Telephone Encounter (Signed)
Called patient back. She says she misses me. She wants to talk about going to the pain doctor. She made an appt on Monday and we will talk about this then. When I asked if she could bring her meds w her, she says she will. She wants a pill box if we have one we can give her. It will [birth year].

## 2019-05-06 ENCOUNTER — Telehealth: Payer: Self-pay | Admitting: Pharmacist

## 2019-05-06 DIAGNOSIS — E782 Mixed hyperlipidemia: Secondary | ICD-10-CM | POA: Diagnosis not present

## 2019-05-06 NOTE — Telephone Encounter (Signed)
Tobacco Cessation / Dyspnea Follow up.   No answer, left message I would call again next week.   Plan to continue support of tobacco abstinence until she is able to leave her home comfortably without COVID restrictions.

## 2019-05-06 NOTE — Telephone Encounter (Signed)
-----   Message from Leavy Cella, [facility] sent at 04/27/2019 11:07 AM EDT ----- Regarding: tobacco cessation follow-up Tobacco cessation

## 2019-05-06 NOTE — Telephone Encounter (Signed)
Patient returned call and reported she is completely off cigarettes.  She reports her breathing is good and she is  Quarantining for COVID-19.  She has avoided her nail salons and beauty parlor.  She requested a Med box to be provided at her visit on Monday with her PCP, Dr. Lindell Noe.   I agreed to provide her a med box.   I will share with the nursing staff or PCP prior to Monday at 1:30PM  Encouraged her to continue with tobacco abstinence.

## 2019-05-07 ENCOUNTER — Ambulatory Visit: Payer: Medicaid Other | Admitting: Physical Therapy

## 2019-05-07 DIAGNOSIS — E782 Mixed hyperlipidemia: Secondary | ICD-10-CM | POA: Diagnosis not present

## 2019-05-08 DIAGNOSIS — E782 Mixed hyperlipidemia: Secondary | ICD-10-CM | POA: Diagnosis not present

## 2019-05-09 DIAGNOSIS — E782 Mixed hyperlipidemia: Secondary | ICD-10-CM | POA: Diagnosis not present

## 2019-05-10 ENCOUNTER — Ambulatory Visit: Payer: Medicaid Other | Admitting: Family Medicine

## 2019-05-10 ENCOUNTER — Other Ambulatory Visit: Payer: Self-pay

## 2019-05-10 ENCOUNTER — Encounter: Payer: Self-pay | Admitting: Family Medicine

## 2019-05-10 DIAGNOSIS — F3112 Bipolar disorder, current episode manic without psychotic features, moderate: Secondary | ICD-10-CM

## 2019-05-10 DIAGNOSIS — G90521 Complex regional pain syndrome I of right lower limb: Secondary | ICD-10-CM

## 2019-05-10 DIAGNOSIS — R21 Rash and other nonspecific skin eruption: Secondary | ICD-10-CM

## 2019-05-10 DIAGNOSIS — F17210 Nicotine dependence, cigarettes, uncomplicated: Secondary | ICD-10-CM | POA: Diagnosis not present

## 2019-05-10 DIAGNOSIS — I10 Essential (primary) hypertension: Secondary | ICD-10-CM

## 2019-05-10 DIAGNOSIS — E782 Mixed hyperlipidemia: Secondary | ICD-10-CM | POA: Diagnosis not present

## 2019-05-10 MED ORDER — HYDROXYZINE HCL 25 MG PO TABS: 25.0000 mg | ORAL_TABLET | Freq: Three times a day (TID) | ORAL | 0 refills | Status: DC | PRN

## 2019-05-10 MED ORDER — TRIAMCINOLONE ACETONIDE 0.025 % EX OINT: 1.0000 "application " | TOPICAL_OINTMENT | Freq: Two times a day (BID) | CUTANEOUS | 0 refills | Status: DC

## 2019-05-10 NOTE — Assessment & Plan Note (Addendum)
Patient is agitated today.  Easily calmed by me as we have an established relationship, however she has a difficult time sitting still in the exam room and is quick to exit.  Again encouraged her to follow-up with psychiatry, as she is currently not established.  She declines this.

## 2019-05-10 NOTE — Assessment & Plan Note (Signed)
Patient is particularly manic today, suspect this is due to agitation.  She states her blood pressure has been better recently at home.

## 2019-05-10 NOTE — Assessment & Plan Note (Signed)
Patient has recently quit tobacco.  Congratulated her for this.

## 2019-05-10 NOTE — Progress Notes (Signed)
   CC: Multiple  HPI  She has been taking her gabapentin TID she says. She states she takes nortriptyline. Her pain is a bad tingling feeling down R lumbar to R leg. She isn't sure podiatry helped. She feels the nortriptlyine is making her sleepy. Doesn't feel the voltaren gel helped.  She again wants me to explain the etiology of complex regional pain syndrome.  She states have never explained this to her before, which is not true.  She has a PT appt tomorrow.  She again asks about a pain management referral, but she also states that she knows the physical therapist from previous referrals and is excited to go there.  She frequently scratches her legs and uses topical steroids for this.  She says she also uses some topical hydration.  She is no longer smoking.   She doesn't currently have a place to walk as she feels her neighborhood is unsafe.  ROS: Denies CP, SOB, abdominal pain, dysuria, changes in BMs.   CC, SH/smoking status, and VS noted  Objective: BP (!) 168/88   Pulse (!) 112   Wt 163 lb 6.4 oz (74.1 kg)   BMI 25.59 kg/m  Gen: NAD, alert, cooperative, and pleasant. HEENT: NCAT, EOMI, PERRL Resp: CTAB, no wheezes, non-labored Ext: No edema, warm.  Hyperpigmented 1 cm lesions on anterior right shin consistent with healed excoriations.  Normal gait. Neuro: Alert and oriented, Speech clear, No gross deficits  Assessment and plan:  Essential hypertension Patient is particularly manic today, suspect this is due to agitation.  She states her blood pressure has been better recently at home.  Complex regional pain syndrome type 1 of lower extremity (Right) Patient again asked for a pain management referral.  We have already referred her to orthopedics recently.  They began with a plan for physical therapy.  I encouraged her to walk as she is able, take nortriptyline as directed, take gabapentin twice daily for ease of dosing.  I think we should continue with the physical therapy  plan rather than involving another office at present.  Bipolar affective disorder [facility]) Patient is agitated today.  Easily calmed by me as we have an established relationship, however she has a difficult time sitting still in the exam room and is quick to exit.  Again encouraged her to follow-up with psychiatry, as she is currently not established.  She declines this.  Nicotine dependence Patient has recently quit tobacco.  Congratulated her for this.  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  The patient has several excoriations over all extremities. Ok to continue atarax and topical kenalog as needed.     No orders of the defined types were placed in this encounter.   Meds ordered this encounter  Medications  . triamcinolone (KENALOG) 0.025 % ointment    Sig: Apply 1 application topically 2 (two) times daily.    Dispense:  80 g    Refill:  0  . hydrOXYzine (ATARAX/VISTARIL) 25 MG tablet    Sig: Take 1 tablet (25 mg total) by mouth every 8 (eight) hours as needed.    Dispense:  30 tablet    Refill:  0    Ralene Ok, MD, PGY3 05/10/2019 1:44 PM

## 2019-05-10 NOTE — Assessment & Plan Note (Signed)
Patient again asked for a pain management referral.  We have already referred her to orthopedics recently.  They began with a plan for physical therapy.  I encouraged her to walk as she is able, take nortriptyline as directed, take gabapentin twice daily for ease of dosing.  I think we should continue with the physical therapy plan rather than involving another office at present.

## 2019-05-11 ENCOUNTER — Ambulatory Visit: Payer: Medicaid Other | Attending: Orthopaedic Surgery | Admitting: Physical Therapy

## 2019-05-11 ENCOUNTER — Encounter: Payer: Self-pay | Admitting: Physical Therapy

## 2019-05-11 DIAGNOSIS — R262 Difficulty in walking, not elsewhere classified: Secondary | ICD-10-CM | POA: Insufficient documentation

## 2019-05-11 DIAGNOSIS — E782 Mixed hyperlipidemia: Secondary | ICD-10-CM | POA: Diagnosis not present

## 2019-05-11 DIAGNOSIS — M5441 Lumbago with sciatica, right side: Secondary | ICD-10-CM | POA: Insufficient documentation

## 2019-05-11 DIAGNOSIS — G8929 Other chronic pain: Secondary | ICD-10-CM | POA: Diagnosis not present
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
## 2019-05-12 DIAGNOSIS — E782 Mixed hyperlipidemia: Secondary | ICD-10-CM | POA: Diagnosis not present

## 2019-05-13 ENCOUNTER — Telehealth: Payer: Self-pay | Admitting: Pharmacist

## 2019-05-13 DIAGNOSIS — E782 Mixed hyperlipidemia: Secondary | ICD-10-CM | POA: Diagnosis not present

## 2019-05-13 NOTE — Telephone Encounter (Signed)
Contacted patient to followup on tobacco cessation.    She reports she has quit smoking for over 1 month.  Denies interest in smoking.   She expressed her thanks for her pill box which she reports has helped her with her medications.    She is interested in a repeat PFT in 2 months.  I shared that we should plan with a phone call follow-up in 2 weeks and then determine a plan for PFT reevaluation.  She denied dyspnea.   No medication today.

## 2019-05-13 NOTE — Telephone Encounter (Signed)
-----   Message from Leavy Cella, [facility] sent at 05/06/2019  2:19 PM EDT ----- Regarding: Tobacco Cessation and Dyspnea Follow-Up

## 2019-05-14 DIAGNOSIS — E782 Mixed hyperlipidemia: Secondary | ICD-10-CM | POA: Diagnosis not present

## 2019-05-15 DIAGNOSIS — E782 Mixed hyperlipidemia: Secondary | ICD-10-CM | POA: Diagnosis not present

## 2019-05-16 DIAGNOSIS — E782 Mixed hyperlipidemia: Secondary | ICD-10-CM | POA: Diagnosis not present

## 2019-05-17 DIAGNOSIS — E782 Mixed hyperlipidemia: Secondary | ICD-10-CM | POA: Diagnosis not present

## 2019-05-18 ENCOUNTER — Ambulatory Visit: Payer: Medicaid Other | Admitting: Physical Therapy

## 2019-05-18 DIAGNOSIS — E782 Mixed hyperlipidemia: Secondary | ICD-10-CM | POA: Diagnosis not present

## 2019-05-19 DIAGNOSIS — E782 Mixed hyperlipidemia: Secondary | ICD-10-CM | POA: Diagnosis not present

## 2019-05-20 ENCOUNTER — Ambulatory Visit (INDEPENDENT_AMBULATORY_CARE_PROVIDER_SITE_OTHER): Payer: Medicaid Other | Admitting: Family Medicine

## 2019-05-20 ENCOUNTER — Ambulatory Visit: Payer: Medicaid Other | Admitting: Physical Therapy

## 2019-05-20 ENCOUNTER — Other Ambulatory Visit: Payer: Self-pay

## 2019-05-20 DIAGNOSIS — L981 Factitial dermatitis: Secondary | ICD-10-CM | POA: Diagnosis not present

## 2019-05-20 DIAGNOSIS — G8929 Other chronic pain: Secondary | ICD-10-CM | POA: Diagnosis not present

## 2019-05-20 DIAGNOSIS — R262 Difficulty in walking, not elsewhere classified: Secondary | ICD-10-CM

## 2019-05-20 DIAGNOSIS — M5441 Lumbago with sciatica, right side: Secondary | ICD-10-CM | POA: Diagnosis not present

## 2019-05-20 DIAGNOSIS — E782 Mixed hyperlipidemia: Secondary | ICD-10-CM | POA: Diagnosis not present

## 2019-05-20 MED ORDER — TRIAMCINOLONE ACETONIDE 0.5 % EX OINT: 1.0000 "application " | TOPICAL_OINTMENT | Freq: Two times a day (BID) | CUTANEOUS | 3 refills | Status: DC

## 2019-05-20 NOTE — Assessment & Plan Note (Signed)
Most likely cause of her itching and rash is neurotic excoriations. She has a history of Bipolar disorder which seems reasonably controlled. Unfortunately, patient could not remember what worked for her last time. - Start Eucerin cream mixed with Triamcinolone ointment 0.5% - F/u in one month - Cont Hydroxyzine - Cover legs during the day and night to minimize scratching

## 2019-05-20 NOTE — Patient Instructions (Addendum)
It was great to meet you today! Thank you for letting me participate in your care!  Today, we discussed your leg rash and itching. It appears to be getting better. I am giving you more Triamcinolone ointment 0.5% to help. You should also use Eucerin cream and cover your legs to help prevent you from scratching them.     Be well, Harolyn Rutherford, DO PGY-2, [facility]

## 2019-05-20 NOTE — Therapy (Signed)
[place], [place], [address] Phone: [phone]   Fax:  [phone]  Physical Therapy Treatment  Patient Details  Name: Rhonda Dominguez MRN: [id number] Date of Birth: Dec 23, 1960 Referring Provider (PT): Dr. Lindell Noe (resident), Dr. Chrisandra Netters ([place])    Encounter Date: 05/20/2019  PT End of Session - 05/20/19 0838    Visit Number  2    Number of Visits  4    Date for PT Re-Evaluation  06/01/19    Authorization Type  MCD     Authorization Time Period  05/17/2019-05/30/2019 3 visits    Authorization - Visit Number  1    Authorization - Number of Visits  3    PT Start Time  0830    PT Stop Time  0915    PT Time Calculation (min)  45 min       Past Medical History:  Diagnosis Date  . Depression   . Diabetes mellitus without complication ([place])   . Drug abuse ([place])   . Hypertension   . Pneumothorax     No past surgical history on file.  There were no vitals filed for this visit.  Subjective Assessment - 05/20/19 0837    Subjective  I prefer to stay with the same therapist.    Currently in Pain?  Yes    Pain Score  7     Pain Location  Back    Pain Orientation  Mid    Pain Descriptors / Indicators  Tightness    Aggravating Factors   activity, bending    Pain Relieving Factors  meds, laying down, propping feet, light walking                       [facility] Adult PT Treatment/Exercise - 05/20/19 0001      Exercises   Exercises  Lumbar      Lumbar Exercises: Stretches   Active Hamstring Stretch  2 reps;30 seconds    Single Knee to Chest Stretch  3 reps;30 seconds    Double Knee to Chest Stretch  2 reps;60 seconds    Lower Trunk Rotation  10 seconds    Lower Trunk Rotation Limitations  10 reps    Prone on Elbows Stretch  1 rep;60 seconds    Press Ups  3 reps    Press Ups Limitations  5 sec.cues to keep pelvis on table, goes into a planlk       Lumbar Exercises:  Supine   Bent Knee Raise  10 reps    Bent Knee Raise Limitations  cues for abdominal brace       Lumbar Exercises: Quadruped   [place]/Old Horse  10 reps    Opposite Arm/Leg Raise  10 reps    Opposite Arm/Leg Raise Limitations  max cues       Modalities   Modalities  Electrical Stimulation;Moist Heat      Moist Heat Therapy   Number Minutes Moist Heat  15 Minutes    Moist Heat Location  Lumbar Spine      Electrical Stimulation   Electrical Stimulation Location  Lumbar     Electrical Stimulation Action  IFC x 15 min    Electrical Stimulation Parameters  6 ma    Electrical Stimulation Goals  Pain               PT Short Term Goals - 05/11/19 1419      PT SHORT  TERM GOAL #1   Title  Pt will be Independent with HEP for initial stretching, mobility exercises    Baseline  unknown previously, given today     Time  2    Period  Weeks    Status  New    Target Date  06/01/19      PT SHORT TERM GOAL #2   Title  Pt will be able to report a mild decrease in Rt sided low back pain with normal ADLs after doing her HEP     Baseline  pain is mod to severe most of the time, does not have a routine for exercise.     Time  2    Period  Weeks    Status  New    Target Date  06/01/19      PT SHORT TERM GOAL #3   Title  Pt will be able to perform ADLs with less rest breaks, dependence on personal care assist.     Baseline  very difficult due to back and Rt LE pain, rests every 5 min or so     Time  2    Period  Weeks    Status  New    Target Date  06/01/19        PT Long Term Goals - 05/11/19 1423      PT LONG TERM GOAL #1   Title  TBA if progressing and attending regularly.             Plan - 05/20/19 0906    Clinical Impression Statement  Pt reprots compliance with HEP, she has a mat at home for exercise. She requires cues to stay on task and hold stretches for appropriate time. Reviewed HEP with cues required for most. Attempted prone pressups with cues to keep  pelvis on table. She can easily lift into a plank from hands.Progressed to quadruped cat/camel and alternating leg lifts. Much difficulty with coordinating arms to alternating legs. Trial of HMP and IFC, needed extra layers due to heat.    PT Next Visit Plan  check HEP, best in private room for concentration, try modaliteis for pain (asess IFC response), try more quadruped and supine core    PT Home Exercise Plan  hamstrings stretch, LTR, KTC and POE        Patient will benefit from skilled therapeutic intervention in order to improve the following deficits and impairments:  Decreased endurance, Decreased mobility, Difficulty walking, Decreased range of motion, Decreased strength, Decreased activity tolerance, Increased fascial restricitons, Impaired flexibility, Postural dysfunction, Pain, Improper body mechanics  Visit Diagnosis: Chronic right-sided low back pain with right-sided sciatica - Difficulty in walking, not elsewhere classified      Problem List Patient Active Problem List   Diagnosis Date Noted  . Rash and nonspecific skin eruption 10/16/2018  . Elevated C-reactive protein (CRP) 11/10/2017  . Vitamin D deficiency 11/10/2017  . Complex regional pain syndrome type 1 of lower extremity (Right) 11/10/2017  . Cocaine abuse ([place]) 11/10/2017  . Problems influencing health status 11/10/2017  . Nicotine dependence 11/10/2017  . Osteoarthritis 11/10/2017  . Neurogenic pain 11/10/2017  . Chronic shoulder pain (Bilateral) (L>R) 11/10/2017  . Dextroscoliosis 11/10/2017  . DDD (degenerative disc disease), cervical 11/10/2017  . Knee pain, chronic (Secondary Area of Pain) (right) 11/09/2017  . Opiate use 10/23/2017  . Great toe pain (Primary Area of Pain) (Right) 10/23/2017  . Chronic lower extremity pain [facility] Area of Pain) (Right) 10/23/2017  . Chronic  pain syndrome 10/23/2017  . Disorder of skeletal system 10/23/2017  . Other reduced mobility 10/23/2017  . Urinary  incontinence 11/20/2016  . Restrictive lung disease 07/23/2016  . Toe pain 04/13/2016  . Essential hypertension 03/22/2016  . Pharmacologic therapy 12/14/2015  . High risk bisexual behavior 05/18/2015  . CATARACTS, BILATERAL 06/24/2007  . Bipolar affective disorder ([place]) 10/03/2006  . CONSTIPATION 10/03/2006  . Eczema 10/03/2006    Dorene Ar , PTA 05/20/2019, 9:15 AM  [place], [place], [address] Phone: [phone]   Fax:  [phone]  Name: Rhonda Dominguez MRN: [id number] Date of Birth: [birth year]

## 2019-05-20 NOTE — Progress Notes (Signed)
     Subjective: Chief Complaint  Patient presents with  . Skin Problem    HPI: Rhonda Dominguez is a 58 y.o. presenting to clinic today to discuss the following:  Lower Leg Itching Patient endorses long term itching of her lower legs and is concerned about the scars it is leaving on her legs. She states she was given a "yucky creamy, sticking cream last time" and doesn't want that again. She is unable to tell me what exactly it was but from the records it appears to be triamcinolone cream .025%. She states she still scratches them and they still itch sometimes but hydroxyzine is helping. She has no other complaints today.      ROS noted in HPI.   Past Medical, Surgical, Social, and Family History Reviewed & Updated per EMR.   Pertinent Historical Findings include:   Social History   Tobacco Use  Smoking Status Former Smoker  Smokeless Tobacco Never Used  Tobacco Comment   one cig a day    Objective: BP 106/78   Pulse 84   SpO2 98%  Vitals and nursing notes reviewed  Physical Exam Gen: Alert and Oriented x 3, NAD Skin: warm, dry, intact, no rashes      Assessment/Plan:  Neurotic excoriations Most likely cause of her itching and rash is neurotic excoriations. She has a history of Bipolar disorder which seems reasonably controlled. Unfortunately, patient could not remember what worked for her last time. - Start Eucerin cream mixed with Triamcinolone ointment 0.5% - F/u in one month - Cont Hydroxyzine - Cover legs during the day and night to minimize scratching   PATIENT EDUCATION PROVIDED: See AVS    Diagnosis and plan along with any newly prescribed medication(s) were discussed in detail with this patient today. The patient verbalized understanding and agreed with the plan. Patient advised if symptoms worsen return to clinic or ER.   Meds ordered this encounter  Medications  . triamcinolone ointment (KENALOG) 0.5 %    Sig: Apply 1 application topically 2  (two) times daily. For moderate to severe eczema.  Do not use for more than 1 week at a time.    Dispense:  60 g    Refill:  [place], DO 05/20/2019, 1:32 PM PGY-2 [place]

## 2019-05-21 DIAGNOSIS — E782 Mixed hyperlipidemia: Secondary | ICD-10-CM | POA: Diagnosis not present

## 2019-05-22 DIAGNOSIS — E782 Mixed hyperlipidemia: Secondary | ICD-10-CM | POA: Diagnosis not present

## 2019-05-23 DIAGNOSIS — E782 Mixed hyperlipidemia: Secondary | ICD-10-CM | POA: Diagnosis not present

## 2019-05-24 ENCOUNTER — Ambulatory Visit: Payer: Medicaid Other | Admitting: Physical Therapy

## 2019-05-24 ENCOUNTER — Telehealth: Payer: Self-pay | Admitting: Physical Therapy

## 2019-05-24 DIAGNOSIS — E782 Mixed hyperlipidemia: Secondary | ICD-10-CM | POA: Diagnosis not present

## 2019-05-24 NOTE — Telephone Encounter (Signed)
Left Voicemail regarding missed appointment this morning. Left next appointment time and asked her to call id she needs to cancel or reschedule.

## 2019-05-25 DIAGNOSIS — E782 Mixed hyperlipidemia: Secondary | ICD-10-CM | POA: Diagnosis not present

## 2019-05-26 ENCOUNTER — Encounter: Payer: Self-pay | Admitting: Physical Therapy

## 2019-05-26 ENCOUNTER — Other Ambulatory Visit: Payer: Self-pay

## 2019-05-26 ENCOUNTER — Ambulatory Visit: Payer: Medicaid Other | Admitting: Physical Therapy

## 2019-05-26 DIAGNOSIS — G8929 Other chronic pain: Secondary | ICD-10-CM | POA: Diagnosis not present

## 2019-05-26 DIAGNOSIS — E782 Mixed hyperlipidemia: Secondary | ICD-10-CM | POA: Diagnosis not present

## 2019-05-26 DIAGNOSIS — R262 Difficulty in walking, not elsewhere classified: Secondary | ICD-10-CM | POA: Diagnosis not present

## 2019-05-26 DIAGNOSIS — M5441 Lumbago with sciatica, right side: Secondary | ICD-10-CM | POA: Diagnosis not present

## 2019-05-26 NOTE — Therapy (Signed)
06/24/2007  . Bipolar affective disorder ([place]) 10/03/2006  . CONSTIPATION 10/03/2006  . Eczema 10/03/2006    Darchelle Nunes 05/26/2019, 8:43 AM  [facility] [address] [place], [place], [address] Phone: [phone]   Fax:  [phone]  Name: KATLEEN CARRAWAY MRN: [id number] Date of Birth: 1961-05-19  Raeford Razor, PT 05/26/19 8:44 AM Phone: [phone] Fax: [phone]  [place], [place], [address] Phone: [phone]   Fax:  [phone]  Physical Therapy Treatment  Patient Details  Name: YVONNIA TANGO MRN: [id number] Date of Birth: [birth year] Referring Provider (PT): Dr. Lindell Noe (resident), Dr. Chrisandra Netters ([place])    Encounter Date: 05/26/2019  PT End of Session - 05/26/19 0816    Visit Number  3    Number of Visits  4    Date for PT Re-Evaluation  06/01/19   next appt 6/29   Authorization Type  MCD     Authorization Time Period  05/17/2019-05/30/2019 3 visits    Authorization - Visit Number  2    Authorization - Number of Visits  3    PT Start Time  0803    PT Stop Time  0841    PT Time Calculation (min)  38 min    Activity Tolerance  Patient tolerated treatment well    Behavior During Therapy  Restless;Anxious;Impulsive       Past Medical History:  Diagnosis Date  . Depression   . Diabetes mellitus without complication ([place])   . Drug abuse ([place])   . Hypertension   . Pneumothorax     History reviewed. No pertinent surgical history.  There were no vitals filed for this visit.  Subjective Assessment - 05/26/19 0805    Subjective  Patient didnt feel like coming.  Missed her appts last time.  She says she called back.  She has been doing her exercises. No pain today.  Her toe is stinging.    Currently in Pain?  No/denies         [facility] PT Assessment - 05/26/19 0001      Assessment   Medical Diagnosis  low back pain     Referring Provider (PT)  Dr. Lindell Noe (resident), Dr. Chrisandra Netters ([place])     Onset Date/Surgical Date  --   chronic    Next MD Visit  unknown    Prior Therapy  Yes       Precautions   Precautions  None      Restrictions   Weight Bearing Restrictions  No      Balance Screen   Has the patient fallen in the past 6 months  No      [place] residence    Living  Arrangements  Alone    Available Help at Discharge  Personal care attendant    Type of [place] living facility    Additional [place] , has daily PCA       Prior Function   Level of Independence  Independent with basic ADLs;Independent with household mobility without device;Independent with community mobility with device;Needs assistance with ADLs;Needs assistance with homemaking    Vocation  On disability    Leisure  likes to go to church, volunteer work      Cognition   Attention  Focused    Focused Attention  Impaired    Focused Attention Impairment  Verbal complex;Functional basic;Functional complex    Behaviors  Restless   "ready to go", "how long will this take"     Observation/Other Assessments   Focus on Therapeutic Outcomes (FOTO)   Nt due to MCD       Sensation   Light Touch  Impaired by gross assessment    Additional Comments  Rt LE       Coordination  [place], [place], [address] Phone: [phone]   Fax:  [phone]  Physical Therapy Treatment  Patient Details  Name: YVONNIA TANGO MRN: [id number] Date of Birth: [birth year] Referring Provider (PT): Dr. Lindell Noe (resident), Dr. Chrisandra Netters ([place])    Encounter Date: 05/26/2019  PT End of Session - 05/26/19 0816    Visit Number  3    Number of Visits  4    Date for PT Re-Evaluation  06/01/19   next appt 6/29   Authorization Type  MCD     Authorization Time Period  05/17/2019-05/30/2019 3 visits    Authorization - Visit Number  2    Authorization - Number of Visits  3    PT Start Time  0803    PT Stop Time  0841    PT Time Calculation (min)  38 min    Activity Tolerance  Patient tolerated treatment well    Behavior During Therapy  Restless;Anxious;Impulsive       Past Medical History:  Diagnosis Date  . Depression   . Diabetes mellitus without complication ([place])   . Drug abuse ([place])   . Hypertension   . Pneumothorax     History reviewed. No pertinent surgical history.  There were no vitals filed for this visit.  Subjective Assessment - 05/26/19 0805    Subjective  Patient didnt feel like coming.  Missed her appts last time.  She says she called back.  She has been doing her exercises. No pain today.  Her toe is stinging.    Currently in Pain?  No/denies         [facility] PT Assessment - 05/26/19 0001      Assessment   Medical Diagnosis  low back pain     Referring Provider (PT)  Dr. Lindell Noe (resident), Dr. Chrisandra Netters ([place])     Onset Date/Surgical Date  --   chronic    Next MD Visit  unknown    Prior Therapy  Yes       Precautions   Precautions  None      Restrictions   Weight Bearing Restrictions  No      Balance Screen   Has the patient fallen in the past 6 months  No      [place] residence    Living  Arrangements  Alone    Available Help at Discharge  Personal care attendant    Type of [place] living facility    Additional [place] , has daily PCA       Prior Function   Level of Independence  Independent with basic ADLs;Independent with household mobility without device;Independent with community mobility with device;Needs assistance with ADLs;Needs assistance with homemaking    Vocation  On disability    Leisure  likes to go to church, volunteer work      Cognition   Attention  Focused    Focused Attention  Impaired    Focused Attention Impairment  Verbal complex;Functional basic;Functional complex    Behaviors  Restless   "ready to go", "how long will this take"     Observation/Other Assessments   Focus on Therapeutic Outcomes (FOTO)   Nt due to MCD       Sensation   Light Touch  Impaired by gross assessment    Additional Comments  Rt LE       Coordination  [place], [place], [address] Phone: [phone]   Fax:  [phone]  Physical Therapy Treatment  Patient Details  Name: YVONNIA TANGO MRN: [id number] Date of Birth: [birth year] Referring Provider (PT): Dr. Lindell Noe (resident), Dr. Chrisandra Netters ([place])    Encounter Date: 05/26/2019  PT End of Session - 05/26/19 0816    Visit Number  3    Number of Visits  4    Date for PT Re-Evaluation  06/01/19   next appt 6/29   Authorization Type  MCD     Authorization Time Period  05/17/2019-05/30/2019 3 visits    Authorization - Visit Number  2    Authorization - Number of Visits  3    PT Start Time  0803    PT Stop Time  0841    PT Time Calculation (min)  38 min    Activity Tolerance  Patient tolerated treatment well    Behavior During Therapy  Restless;Anxious;Impulsive       Past Medical History:  Diagnosis Date  . Depression   . Diabetes mellitus without complication ([place])   . Drug abuse ([place])   . Hypertension   . Pneumothorax     History reviewed. No pertinent surgical history.  There were no vitals filed for this visit.  Subjective Assessment - 05/26/19 0805    Subjective  Patient didnt feel like coming.  Missed her appts last time.  She says she called back.  She has been doing her exercises. No pain today.  Her toe is stinging.    Currently in Pain?  No/denies         [facility] PT Assessment - 05/26/19 0001      Assessment   Medical Diagnosis  low back pain     Referring Provider (PT)  Dr. Lindell Noe (resident), Dr. Chrisandra Netters ([place])     Onset Date/Surgical Date  --   chronic    Next MD Visit  unknown    Prior Therapy  Yes       Precautions   Precautions  None      Restrictions   Weight Bearing Restrictions  No      Balance Screen   Has the patient fallen in the past 6 months  No      [place] residence    Living  Arrangements  Alone    Available Help at Discharge  Personal care attendant    Type of [place] living facility    Additional [place] , has daily PCA       Prior Function   Level of Independence  Independent with basic ADLs;Independent with household mobility without device;Independent with community mobility with device;Needs assistance with ADLs;Needs assistance with homemaking    Vocation  On disability    Leisure  likes to go to church, volunteer work      Cognition   Attention  Focused    Focused Attention  Impaired    Focused Attention Impairment  Verbal complex;Functional basic;Functional complex    Behaviors  Restless   "ready to go", "how long will this take"     Observation/Other Assessments   Focus on Therapeutic Outcomes (FOTO)   Nt due to MCD       Sensation   Light Touch  Impaired by gross assessment    Additional Comments  Rt LE       Coordination

## 2019-05-27 ENCOUNTER — Telehealth: Payer: Self-pay | Admitting: Pharmacist

## 2019-05-27 ENCOUNTER — Telehealth: Payer: Self-pay | Admitting: Family Medicine

## 2019-05-27 DIAGNOSIS — E782 Mixed hyperlipidemia: Secondary | ICD-10-CM | POA: Diagnosis not present

## 2019-05-27 DIAGNOSIS — Z1239 Encounter for other screening for malignant neoplasm of breast: Secondary | ICD-10-CM

## 2019-05-27 DIAGNOSIS — Z1211 Encounter for screening for malignant neoplasm of colon: Secondary | ICD-10-CM

## 2019-05-27 MED ORDER — NICOTINE 7 MG/24HR TD PT24: 7.0000 mg | MEDICATED_PATCH | Freq: Every day | TRANSDERMAL | 4 refills | Status: DC

## 2019-05-27 NOTE — Telephone Encounter (Signed)
-----   Message from Leavy Cella, [facility] sent at 05/13/2019  3:41 PM EDT ----- Regarding: tobacco cessation F/U Dyspnea PFT in near future.

## 2019-05-27 NOTE — Telephone Encounter (Signed)
-----   Message from Leavy Cella, [facility] sent at 05/13/2019  3:41 PM EDT ----- Regarding: tobacco cessation F/U Dyspnea PFT in near future.

## 2019-05-27 NOTE — Telephone Encounter (Signed)
Pt is calling and would like to have referrals sent to have a mammogram and a colonoscopy. She said she is not for sure if she is due for them but wanted to double check.   Please call patient if there are any questions.

## 2019-05-27 NOTE — Telephone Encounter (Signed)
Patient contacted in phone call follow-up for tobacco cessation.  Patient reports continued use of nicotine 14mg  patch has only 1 of this dose left.    I agreed to supply additional 7mg  patch.  Sent to her pharmacy.

## 2019-05-28 DIAGNOSIS — E782 Mixed hyperlipidemia: Secondary | ICD-10-CM | POA: Diagnosis not present

## 2019-05-29 DIAGNOSIS — E782 Mixed hyperlipidemia: Secondary | ICD-10-CM | POA: Diagnosis not present

## 2019-05-30 DIAGNOSIS — E782 Mixed hyperlipidemia: Secondary | ICD-10-CM | POA: Diagnosis not present

## 2019-05-31 DIAGNOSIS — E782 Mixed hyperlipidemia: Secondary | ICD-10-CM | POA: Diagnosis not present

## 2019-06-01 DIAGNOSIS — E782 Mixed hyperlipidemia: Secondary | ICD-10-CM | POA: Diagnosis not present

## 2019-06-01 NOTE — Telephone Encounter (Signed)
Pt calling again about these referrals for her mammogram and her colonoscopy. Pt irritated she hasn't heard back and it's been over a week since she first called. Please call pt back to discuss this.

## 2019-06-02 ENCOUNTER — Other Ambulatory Visit: Payer: Self-pay

## 2019-06-02 DIAGNOSIS — E782 Mixed hyperlipidemia: Secondary | ICD-10-CM | POA: Diagnosis not present

## 2019-06-02 DIAGNOSIS — R21 Rash and other nonspecific skin eruption: Secondary | ICD-10-CM

## 2019-06-02 NOTE — Addendum Note (Signed)
Addended by: Glenis Smoker on: 06/02/2019 08:59 AM   Modules accepted: Orders

## 2019-06-02 NOTE — Telephone Encounter (Addendum)
Called patient, my apologies that I did not get the mammo and colonoscopy message last week. Both are ordered.   She asked about scheduling her derm f/u appt - I helped her schedule this.

## 2019-06-03 DIAGNOSIS — E782 Mixed hyperlipidemia: Secondary | ICD-10-CM | POA: Diagnosis not present

## 2019-06-03 MED ORDER — HYDROXYZINE HCL 25 MG PO TABS: 25.0000 mg | ORAL_TABLET | Freq: Three times a day (TID) | ORAL | 0 refills | Status: DC | PRN

## 2019-06-04 ENCOUNTER — Telehealth: Payer: Self-pay | Admitting: Family Medicine

## 2019-06-04 DIAGNOSIS — E782 Mixed hyperlipidemia: Secondary | ICD-10-CM | POA: Diagnosis not present

## 2019-06-04 NOTE — Telephone Encounter (Signed)
Pt called today about her medication for her hydrOXYzine. Please give pt a call back regarding her medication refill.

## 2019-06-05 DIAGNOSIS — E782 Mixed hyperlipidemia: Secondary | ICD-10-CM | POA: Diagnosis not present

## 2019-06-06 DIAGNOSIS — E782 Mixed hyperlipidemia: Secondary | ICD-10-CM | POA: Diagnosis not present

## 2019-06-07 ENCOUNTER — Other Ambulatory Visit: Payer: Self-pay

## 2019-06-07 ENCOUNTER — Telehealth: Payer: Self-pay | Admitting: Family Medicine

## 2019-06-07 ENCOUNTER — Encounter: Payer: Self-pay | Admitting: Physical Therapy

## 2019-06-07 ENCOUNTER — Ambulatory Visit (INDEPENDENT_AMBULATORY_CARE_PROVIDER_SITE_OTHER): Payer: Medicaid Other | Admitting: Family Medicine

## 2019-06-07 ENCOUNTER — Encounter: Payer: Self-pay | Admitting: Family Medicine

## 2019-06-07 ENCOUNTER — Ambulatory Visit: Payer: Medicaid Other | Admitting: Physical Therapy

## 2019-06-07 VITALS — BP 140/88 | HR 68

## 2019-06-07 DIAGNOSIS — G90521 Complex regional pain syndrome I of right lower limb: Secondary | ICD-10-CM

## 2019-06-07 DIAGNOSIS — G8929 Other chronic pain: Secondary | ICD-10-CM

## 2019-06-07 DIAGNOSIS — F17210 Nicotine dependence, cigarettes, uncomplicated: Secondary | ICD-10-CM | POA: Diagnosis not present

## 2019-06-07 DIAGNOSIS — E782 Mixed hyperlipidemia: Secondary | ICD-10-CM | POA: Diagnosis not present

## 2019-06-07 DIAGNOSIS — M5441 Lumbago with sciatica, right side: Secondary | ICD-10-CM | POA: Diagnosis not present

## 2019-06-07 DIAGNOSIS — I10 Essential (primary) hypertension: Secondary | ICD-10-CM

## 2019-06-07 DIAGNOSIS — R262 Difficulty in walking, not elsewhere classified: Secondary | ICD-10-CM | POA: Diagnosis not present
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
## 2019-06-07 NOTE — Patient Instructions (Signed)
We will mail your blood work results.   Keep up the good work with walking and not smoking.

## 2019-06-07 NOTE — Progress Notes (Signed)
   CC: back pain, htn  HPI  HTN - forgot her medicine today, going to take it after this. Not checking BP at home.   Just the same back pain as previous. No belly pain today, she is worried abotu getting routine lab work. She would like her lab work tests mailed.  She is working w Pt and also trying to walk around her block. Mostly remembering her nortriptiline and gabapentin. She is insistent on going to a pain management clinic.   Tobacco use - she is on her step 3 patch and excited to keep working with our pharmacy team.   ROS: Denies CP, SOB, abdominal pain, dysuria, changes in BMs.   CC, SH/smoking status, and VS noted  Objective: BP 140/88   Pulse 68   SpO2 99%  Gen: NAD, alert, cooperative, and pleasant. HEENT: NCAT, EOMI, PERRL CV: RRR, no murmur Resp: CTAB, no wheezes, non-labored Ext: No edema, warm Neuro: Alert and oriented, Speech clear, No gross deficits  Assessment and plan:  Complex regional pain syndrome type 1 of lower extremity (Right) Patient somewhat compliant with TCA and gabapentin. She really really would like to go to the pain clinic. Given the complexity of this disease, I think it is reasonable for her to seek another opinion, however, I do also think the PT that ortho set her up with is a great plan as well. Referral placed at her request.   Nicotine dependence Working with our pharmacy team for patches and smoking cessation, congratulated her today.   Essential hypertension Elevated today but patient is somewhat manic and no meds. Reminded her to take her BP meds. Recheck BMP today.    Orders Placed This Encounter  Procedures  . Basic metabolic panel  . Ambulatory referral to Pain Clinic    Referral Priority:   Routine    Referral Type:   Consultation    Referral Reason:   Specialty Services Required    Requested Specialty:   Pain Medicine    Number of Visits Requested:   1    No orders of the defined types were placed in this encounter.    Health Maintenance reviewed - reminded her to get her PAP smear next visit.  Ralene Ok, MD, PGY3 06/08/2019 8:41 AM

## 2019-06-08 ENCOUNTER — Ambulatory Visit: Payer: Medicaid Other | Admitting: Orthopaedic Surgery

## 2019-06-08 ENCOUNTER — Telehealth: Payer: Self-pay | Admitting: Family Medicine

## 2019-06-08 DIAGNOSIS — E782 Mixed hyperlipidemia: Secondary | ICD-10-CM | POA: Diagnosis not present

## 2019-06-08 LAB — BASIC METABOLIC PANEL
BUN/Creatinine Ratio: 23 (ref 9–23)
BUN: 20 mg/dL (ref 6–24)
CO2: 23 mmol/L (ref 20–29)
Calcium: 10.3 mg/dL — ABNORMAL HIGH (ref 8.7–10.2)
Chloride: 105 mmol/L (ref 96–106)
Creatinine, Ser: 0.88 mg/dL (ref 0.57–1.00)
GFR calc Af Amer: 83 mL/min/{1.73_m2} (ref >59)
GFR calc non Af Amer: 72 mL/min/{1.73_m2} (ref >59)
Glucose: 93 mg/dL (ref 65–99)
Potassium: 3.9 mmol/L (ref 3.5–5.2)
Sodium: 143 mmol/L (ref 134–144)

## 2019-06-08 NOTE — Assessment & Plan Note (Signed)
Elevated today but patient is somewhat manic and no meds. Reminded her to take her BP meds. Recheck BMP today.

## 2019-06-08 NOTE — Assessment & Plan Note (Signed)
Patient somewhat compliant with TCA and gabapentin. She really really would like to go to the pain clinic. Given the complexity of this disease, I think it is reasonable for her to seek another opinion, however, I do also think the PT that ortho set her up with is a great plan as well. Referral placed at her request.

## 2019-06-08 NOTE — Assessment & Plan Note (Signed)
Working with our pharmacy team for patches and smoking cessation, congratulated her today.

## 2019-06-08 NOTE — Telephone Encounter (Signed)
Admin team, please mail results to patient. I have pended results letter as well.

## 2019-06-09 ENCOUNTER — Ambulatory Visit: Payer: Medicaid Other | Admitting: Orthopaedic Surgery

## 2019-06-09 DIAGNOSIS — E782 Mixed hyperlipidemia: Secondary | ICD-10-CM | POA: Diagnosis not present

## 2019-06-10 DIAGNOSIS — E782 Mixed hyperlipidemia: Secondary | ICD-10-CM | POA: Diagnosis not present

## 2019-06-10 NOTE — Telephone Encounter (Signed)
-----   Message from Leavy Cella, [facility] sent at 05/27/2019  1:35 PM EDT ----- Regarding: tobacco cessation 7mg  patch reduction f/u

## 2019-06-10 NOTE — Telephone Encounter (Signed)
Follow Up RE respiratory / dyspnea and tobacco cessation.   Patient reports breathing is great, denies shortness of breath, and she reports continued abstinence from cigarettes.   Using 7mg  patches daily.   Follow-up planned in 2 weeks.

## 2019-06-11 DIAGNOSIS — E782 Mixed hyperlipidemia: Secondary | ICD-10-CM | POA: Diagnosis not present

## 2019-06-12 DIAGNOSIS — E782 Mixed hyperlipidemia: Secondary | ICD-10-CM | POA: Diagnosis not present

## 2019-06-13 DIAGNOSIS — E782 Mixed hyperlipidemia: Secondary | ICD-10-CM | POA: Diagnosis not present

## 2019-06-14 DIAGNOSIS — E782 Mixed hyperlipidemia: Secondary | ICD-10-CM | POA: Diagnosis not present

## 2019-06-15 DIAGNOSIS — E782 Mixed hyperlipidemia: Secondary | ICD-10-CM | POA: Diagnosis not present

## 2019-06-16 DIAGNOSIS — E782 Mixed hyperlipidemia: Secondary | ICD-10-CM | POA: Diagnosis not present

## 2019-06-17 ENCOUNTER — Ambulatory Visit (INDEPENDENT_AMBULATORY_CARE_PROVIDER_SITE_OTHER): Payer: Medicaid Other | Admitting: Family Medicine

## 2019-06-17 ENCOUNTER — Other Ambulatory Visit: Payer: Self-pay

## 2019-06-17 ENCOUNTER — Ambulatory Visit: Payer: Medicaid Other

## 2019-06-17 VITALS — BP 132/90 | HR 89

## 2019-06-17 DIAGNOSIS — E782 Mixed hyperlipidemia: Secondary | ICD-10-CM | POA: Diagnosis not present

## 2019-06-17 DIAGNOSIS — L981 Factitial dermatitis: Secondary | ICD-10-CM

## 2019-06-17 MED ORDER — TRIAMCINOLONE ACETONIDE 0.1 % EX CREA: 1.0000 "application " | TOPICAL_CREAM | Freq: Two times a day (BID) | CUTANEOUS | 2 refills | Status: DC

## 2019-06-17 MED ORDER — TRETINOIN 0.05 % EX CREA: TOPICAL_CREAM | Freq: Every day | CUTANEOUS | 1 refills | Status: DC

## 2019-06-17 MED FILL — TRETINOIN 0.05 % CREA: 0.05 | 10 days supply | Qty: 20 | Fill #0

## 2019-06-17 NOTE — Assessment & Plan Note (Signed)
-   Lesions mildly improved with triamcinolone ointment - Patient requesting cream rather than ointment - Prescribed triamcinolone ointment  - continue hydroxyzine for itching  - minimize scratching

## 2019-06-17 NOTE — Progress Notes (Addendum)
    Subjective:  Rhonda Dominguez is a 58 y.o. female who presents to the [facility] today for a follow up visit from her previous dermatology visit with Dr. Gwendlyn Deutscher. Patient reports that her hyperpigmented lesions has had little to no improvement and she is unhappy with the ointment that she was prescribed. She is concerned because she says it "gets everywhere and ruins my clothes". Patient reports that the lesions are also still itching but not as bad.   Objective:  Physical Exam: BP 132/90   Pulse 89   SpO2 98%   Gen: NAD, sitting comfortably Skin: warm, dry, multiple hyperpigmented lesions on LE BL which seem to be slightly improved when compared to previous visit pictures. New pictures below.         Assessment/Plan:  Neurotic excoriations - Lesions mildly improved with triamcinolone ointment - Patient requesting cream rather than ointment - Prescribed triamcinolone ointment  - continue hydroxyzine for itching  - minimize scratching    Meds ordered this encounter  Medications  . DISCONTD: tretinoin (RETIN-A) 0.05 % cream    Sig: Apply topically at bedtime.    Dispense:  20 g    Refill:  1  . triamcinolone cream (KENALOG) 0.1 %    Sig: Apply 1 application topically 2 (two) times daily.    Dispense:  30 g    Refill:  2    I was present during history and physical and agree with assessment and plans  Lind Covert  Gifford Shave, MD  PGY-1, [facility]  06/17/19 3:22 PM

## 2019-06-17 NOTE — Progress Notes (Signed)
retSubjective  Rhonda Dominguez is a 58 y.o. female is presenting with the following  Chief Complaint noted Review of Symptoms - see HPI PMH - Smoking status noted.    Objective Vital Signs reviewed BP 132/90   Pulse 89   SpO2 98%   Assessments/Plans  See after visit summary for details of patient instuctions  No problem-specific Assessment & Plan notes found for this encounter.

## 2019-06-18 DIAGNOSIS — E782 Mixed hyperlipidemia: Secondary | ICD-10-CM | POA: Diagnosis not present

## 2019-06-19 DIAGNOSIS — E782 Mixed hyperlipidemia: Secondary | ICD-10-CM | POA: Diagnosis not present

## 2019-06-20 DIAGNOSIS — E782 Mixed hyperlipidemia: Secondary | ICD-10-CM | POA: Diagnosis not present

## 2019-06-21 DIAGNOSIS — E782 Mixed hyperlipidemia: Secondary | ICD-10-CM | POA: Diagnosis not present

## 2019-06-22 DIAGNOSIS — E782 Mixed hyperlipidemia: Secondary | ICD-10-CM | POA: Diagnosis not present

## 2019-06-23 DIAGNOSIS — E782 Mixed hyperlipidemia: Secondary | ICD-10-CM | POA: Diagnosis not present

## 2019-06-24 ENCOUNTER — Telehealth: Payer: Self-pay | Admitting: Pharmacist

## 2019-06-24 DIAGNOSIS — E782 Mixed hyperlipidemia: Secondary | ICD-10-CM | POA: Diagnosis not present

## 2019-06-24 NOTE — Telephone Encounter (Signed)
-----   Message from Leavy Cella, [facility] sent at 06/10/2019  1:25 PM EDT ----- Regarding: Tobacco Cessation

## 2019-06-24 NOTE — Telephone Encounter (Signed)
Reports complete tobacco cessation for 3 months.   Consider patch - taper off at next visit.   Plans in person follow-up with new PCP, Dr. Pilar Plate and Rx clinic visit in 1 week.

## 2019-06-24 NOTE — Telephone Encounter (Signed)
-----   Message from Leavy Cella, [facility] sent at 06/10/2019  1:25 PM EDT ----- Regarding: Tobacco Cessation

## 2019-06-25 DIAGNOSIS — E782 Mixed hyperlipidemia: Secondary | ICD-10-CM | POA: Diagnosis not present

## 2019-06-26 DIAGNOSIS — E782 Mixed hyperlipidemia: Secondary | ICD-10-CM | POA: Diagnosis not present

## 2019-06-27 DIAGNOSIS — E782 Mixed hyperlipidemia: Secondary | ICD-10-CM | POA: Diagnosis not present

## 2019-06-28 DIAGNOSIS — E782 Mixed hyperlipidemia: Secondary | ICD-10-CM | POA: Diagnosis not present

## 2019-06-29 DIAGNOSIS — E782 Mixed hyperlipidemia: Secondary | ICD-10-CM | POA: Diagnosis not present

## 2019-06-30 DIAGNOSIS — E782 Mixed hyperlipidemia: Secondary | ICD-10-CM | POA: Diagnosis not present

## 2019-07-01 ENCOUNTER — Ambulatory Visit: Payer: Medicaid Other | Admitting: Family Medicine

## 2019-07-01 ENCOUNTER — Ambulatory Visit: Payer: Medicaid Other | Admitting: Pharmacist

## 2019-07-01 ENCOUNTER — Ambulatory Visit (INDEPENDENT_AMBULATORY_CARE_PROVIDER_SITE_OTHER): Payer: Medicaid Other | Admitting: Family Medicine

## 2019-07-01 ENCOUNTER — Other Ambulatory Visit: Payer: Self-pay

## 2019-07-01 ENCOUNTER — Encounter: Payer: Self-pay | Admitting: Family Medicine

## 2019-07-01 ENCOUNTER — Encounter: Payer: Self-pay | Admitting: Pharmacist

## 2019-07-01 ENCOUNTER — Ambulatory Visit (INDEPENDENT_AMBULATORY_CARE_PROVIDER_SITE_OTHER): Payer: Medicaid Other | Admitting: Pharmacist

## 2019-07-01 VITALS — BP 112/60 | HR 98 | Ht 67.0 in | Wt 160.5 lb

## 2019-07-01 DIAGNOSIS — G90521 Complex regional pain syndrome I of right lower limb: Secondary | ICD-10-CM | POA: Diagnosis present

## 2019-07-01 DIAGNOSIS — I10 Essential (primary) hypertension: Secondary | ICD-10-CM

## 2019-07-01 DIAGNOSIS — E782 Mixed hyperlipidemia: Secondary | ICD-10-CM | POA: Diagnosis not present

## 2019-07-01 DIAGNOSIS — R0683 Snoring: Secondary | ICD-10-CM | POA: Diagnosis not present

## 2019-07-01 DIAGNOSIS — F17210 Nicotine dependence, cigarettes, uncomplicated: Secondary | ICD-10-CM

## 2019-07-01 DIAGNOSIS — L981 Factitial dermatitis: Secondary | ICD-10-CM | POA: Diagnosis not present

## 2019-07-01 DIAGNOSIS — R21 Rash and other nonspecific skin eruption: Secondary | ICD-10-CM | POA: Diagnosis not present

## 2019-07-01 MED ORDER — FAMOTIDINE 20 MG PO TABS: 20.0000 mg | ORAL_TABLET | Freq: Two times a day (BID) | ORAL | 6 refills | Status: DC

## 2019-07-01 MED ORDER — HYDROXYZINE HCL 25 MG PO TABS: 25.0000 mg | ORAL_TABLET | Freq: Three times a day (TID) | ORAL | 0 refills | Status: DC | PRN

## 2019-07-01 MED ORDER — ALBUTEROL SULFATE HFA 108 (90 BASE) MCG/ACT IN AERS: 2.0000 | INHALATION_SPRAY | RESPIRATORY_TRACT | 3 refills | Status: DC | PRN

## 2019-07-01 MED ORDER — GABAPENTIN 300 MG PO CAPS: 300.0000 mg | ORAL_CAPSULE | Freq: Three times a day (TID) | ORAL | 1 refills | Status: DC

## 2019-07-01 NOTE — Assessment & Plan Note (Signed)
She has not smoked any cigarettes for the past 2 months and is pleased with her progress. -Continue to follow with Dr. Valentina Lucks for nicotine dependence -Taper nicotine patch per Dr. Graylin Shiver recommendation

## 2019-07-01 NOTE — Progress Notes (Signed)
   S:  Patient arrives to clinic ambulatory with no assistance with normal mood.   Patient arrives for evaluation/assistance with tobacco dependence.   Patient was referred on 06/07/2019.  Patient was last seen by Primary Care Provider on 07/01/2019.   Most recent quit attempt has lasted more than 3 months at this time.  She continues on 7mg  nicotine patches at this time.  This is the longest time ever been tobacco free in her late adult years.   Motivation to remain quit remains high at this time.    A/P: Tobacco use disorder with extended quit period of > 3 months with use of nicotine patches. She remains an  excellent candidate for long-term success because of her commitment to "breathing better".   - Continued nicotine replacement tx with 7mg  nicotine patches at this time.  Denies any issues with patches.   Inquired re: refill record and determined her refills are behind schedule for Spiriva, HCTZ ang Nortriptyline.  Encourage more adherence at next contact.   Sent new refills for famotidine and albuterol MDI at patient request.    F/U phone call 2-3 weeks.  F/U Rx Clinic Visit  PRN. Total time in face-to-face counseling 13 minutes.  Patient seen with Acie Fredrickson, PharmD Candidate. Marland Kitchen

## 2019-07-01 NOTE — Assessment & Plan Note (Signed)
-  Continue HCTZ 25mg daily

## 2019-07-01 NOTE — Patient Instructions (Signed)
Continue same as previous    No changes in medications today.

## 2019-07-01 NOTE — Assessment & Plan Note (Signed)
She was recently referred to physical therapy as well which appeared to be helpful. -Refill gabapentin 300 mg 3 times daily -Refill nortriptyline 25 mg daily as needed -Follow-up recent referral to pain medicine

## 2019-07-01 NOTE — Patient Instructions (Addendum)
It was great to meet you today!  We covered quite a few things in a short time.  Here's a quick summary:  Leg pain - I refilled your pain medication (Gabapentin - you can take this as often as 3 times daily, but 2 times daily is OK). This will work best when you use it every day.  I have also put in a referral to the pain clinic.  Call us back if you haven't heard anything in one week.  Itching/skin picking - I refilled your itching medication (Vistaril)  Snoring - It sounds like you could have something called obstructive sleep apnea.  I have put in a referral to sleep medicine.  Call the clinic if you haven't heard anything in one week.  Smoking - Keep up the good work! No cigarettes in 2 months, that's great! Continue using your patch and working with Dr. Valentina Lucks to decreased your dependence on nicotine.   We'll get some blood work next time you come in.

## 2019-07-01 NOTE — Assessment & Plan Note (Signed)
-  Antibiotic ointment and Band-Aids provided in clinic -Hydroxyzine refilled

## 2019-07-01 NOTE — Progress Notes (Signed)
.  tobrx

## 2019-07-01 NOTE — Assessment & Plan Note (Signed)
Tobacco use disorder with extended quit period of > 3 months with use of nicotine patches. She remains an  excellent candidate for long-term success because of her commitment to "breathing better".   - Continued nicotine replacement tx with 7mg  nicotine patches at this time.  Denies any issues with patches.

## 2019-07-01 NOTE — Progress Notes (Signed)
Patient ID: Rhonda Dominguez, female   DOB: 1961-04-19, 58 y.o.   MRN: [id number] Reviewed: Agree with Dr. Graylin Shiver documentation and management.

## 2019-07-01 NOTE — Progress Notes (Signed)
    Subjective:  Rhonda Dominguez is a 58 y.o. female who presents to the [facility] today to meet her new doctor and discuss chronic medical issues.   HPI: Skin picking She reports a new scab in her left upper arm.  She reports that is secondary to a bug bite several days ago.  She reports that previously had been draining although is now well-healing and not painful.  She has requested this scab be medicated and dressed appropriately.  Smoking cessation She reports that she has not had a cigarette in the past 2 months.  She is pleased with her significant improvement as regards smoking cessation.  She continues to take 1 7 mg nicotine patch daily.  She meets regularly with Dr. Valentina Lucks regarding smoking cessation and decreased dependence on nicotine.  Complex regional pain, right knee She reports that she is currently out of pain medication.  Her current pain medication includes gabapentin 300 mg 3 times daily and nortriptyline 25 mg daily.  She reports that she has been taking gabapentin only twice daily.  She reports this does provide significant pain relief.  Spite these medications, she was hoping to be connected with a pain medicine clinic.  She reports this is previously part of the plan and she has been awaiting a phone call.  There is a referral to pain medicine placed 6/29.  Snoring She reports that she snores loudly at night and she is concerned about this breathing problem.  Her risk factors for obstructive sleep apnea include high blood pressure, age.  Her stop pain was tallied for total of 3 points putting her at high risk of obstructive sleep apnea.  Hypertension Current medication includes HCTZ 25 mg daily.  Blood pressure well controlled today at 112/60.  No complaints of dizziness/lightheadedness.  Chief Complaint noted Review of Symptoms - see HPI PMH - Smoking status noted.    Objective:  Physical Exam: BP 112/60   Pulse 98   Ht 5\' 7"  (1.702 m)   Wt 160 lb 8 oz (72.8 kg)    SpO2 96%   BMI 25.14 kg/m    Gen: NAD, resting comfortably CV: RRR with no murmurs appreciated, strong radial pulse Pulm: NWOB, CTAB with no crackles, wheezes, or rhonchi Skin: Hyperpigmentation and scabbing noted over arms and legs.  She new scabs/lesions noted in left upper arm.  No erythema or purulence noted.  One covered by Band-Aid appears well-healing.  Nicotine patch placed over right deltoid.   No results found for this or any previous visit (from the past 72 hour(s)).   Assessment/Plan:  Essential hypertension -Continue HCTZ 25 mg daily  Complex regional pain syndrome type 1 of lower extremity (Right) She was recently referred to physical therapy as well which appeared to be helpful. -Refill gabapentin 300 mg 3 times daily -Refill nortriptyline 25 mg daily as needed -Follow-up recent referral to pain medicine  Neurotic excoriations -Antibiotic ointment and Band-Aids provided in clinic -Hydroxyzine refilled  Nicotine dependence She has not smoked any cigarettes for the past 2 months and is pleased with her progress. -Continue to follow with Dr. Valentina Lucks for nicotine dependence -Taper nicotine patch per Dr. Graylin Shiver recommendation  Loud snoring Stop bang score of 3.  High risk for obstructive sleep apnea.  She reports previous sleep study though none can be found in the system. -Sleep study referral placed

## 2019-07-01 NOTE — Assessment & Plan Note (Signed)
Stop bang score of 3.  High risk for obstructive sleep apnea.  She reports previous sleep study though none can be found in the system. -Sleep study referral placed

## 2019-07-02 DIAGNOSIS — E782 Mixed hyperlipidemia: Secondary | ICD-10-CM | POA: Diagnosis not present

## 2019-07-03 DIAGNOSIS — E782 Mixed hyperlipidemia: Secondary | ICD-10-CM | POA: Diagnosis not present

## 2019-07-04 DIAGNOSIS — E782 Mixed hyperlipidemia: Secondary | ICD-10-CM | POA: Diagnosis not present

## 2019-07-05 DIAGNOSIS — E782 Mixed hyperlipidemia: Secondary | ICD-10-CM | POA: Diagnosis not present

## 2019-07-06 ENCOUNTER — Other Ambulatory Visit (INDEPENDENT_AMBULATORY_CARE_PROVIDER_SITE_OTHER): Payer: Self-pay | Admitting: Orthopaedic Surgery

## 2019-07-06 DIAGNOSIS — E782 Mixed hyperlipidemia: Secondary | ICD-10-CM | POA: Diagnosis not present

## 2019-07-06 NOTE — Telephone Encounter (Signed)
Yes, thanks

## 2019-07-06 NOTE — Telephone Encounter (Signed)
Ok to rf? 

## 2019-07-07 DIAGNOSIS — E782 Mixed hyperlipidemia: Secondary | ICD-10-CM | POA: Diagnosis not present

## 2019-07-08 DIAGNOSIS — E782 Mixed hyperlipidemia: Secondary | ICD-10-CM | POA: Diagnosis not present

## 2019-07-09 DIAGNOSIS — E782 Mixed hyperlipidemia: Secondary | ICD-10-CM | POA: Diagnosis not present

## 2019-07-10 DIAGNOSIS — E782 Mixed hyperlipidemia: Secondary | ICD-10-CM | POA: Diagnosis not present

## 2019-07-11 DIAGNOSIS — E782 Mixed hyperlipidemia: Secondary | ICD-10-CM | POA: Diagnosis not present

## 2019-07-12 DIAGNOSIS — E782 Mixed hyperlipidemia: Secondary | ICD-10-CM | POA: Diagnosis not present

## 2019-07-13 DIAGNOSIS — E782 Mixed hyperlipidemia: Secondary | ICD-10-CM | POA: Diagnosis not present

## 2019-07-14 ENCOUNTER — Telehealth: Payer: Self-pay | Admitting: Pharmacist

## 2019-07-14 DIAGNOSIS — E782 Mixed hyperlipidemia: Secondary | ICD-10-CM | POA: Diagnosis not present

## 2019-07-14 NOTE — Telephone Encounter (Signed)
-----   Message from Leavy Cella, [facility] sent at 07/01/2019  5:04 PM EDT ----- Regarding: Smoking Follow Up

## 2019-07-14 NOTE — Telephone Encounter (Signed)
F/U tobacco cessation   Reports continued abstinence from tobacco - continues on 7mg  nicotine.  Planned phone call f/u in 2 weeks.  Attempt taper off patch at that time.    Requested follow-up on pain clinic referral.  She expressed frustration RE not having any schedule pain clinic appointment yet.   She requested a follow-up to communicate status from our office.

## 2019-07-15 ENCOUNTER — Institutional Professional Consult (permissible substitution): Payer: Medicaid Other | Admitting: Neurology

## 2019-07-15 DIAGNOSIS — E782 Mixed hyperlipidemia: Secondary | ICD-10-CM | POA: Diagnosis not present

## 2019-07-16 DIAGNOSIS — E782 Mixed hyperlipidemia: Secondary | ICD-10-CM | POA: Diagnosis not present

## 2019-07-17 DIAGNOSIS — E782 Mixed hyperlipidemia: Secondary | ICD-10-CM | POA: Diagnosis not present

## 2019-07-18 DIAGNOSIS — E782 Mixed hyperlipidemia: Secondary | ICD-10-CM | POA: Diagnosis not present

## 2019-07-19 DIAGNOSIS — E782 Mixed hyperlipidemia: Secondary | ICD-10-CM | POA: Diagnosis not present

## 2019-07-20 ENCOUNTER — Telehealth: Payer: Self-pay | Admitting: Family Medicine

## 2019-07-20 DIAGNOSIS — E782 Mixed hyperlipidemia: Secondary | ICD-10-CM | POA: Diagnosis not present

## 2019-07-20 NOTE — Telephone Encounter (Signed)
Will forward to MD to place these DME orders.  The blue pads would be similar to chuck pads.  Jazmin Hartsell,CMA

## 2019-07-20 NOTE — Telephone Encounter (Signed)
Pt called because [facility] needs a new orders/prescriptions for the following equipment. She needs the walker with a seat, Blue pads for her bed, diapers for herself, free standing toilet that can be moved by her bed at night. Please fax or call [place]. jw

## 2019-07-21 DIAGNOSIS — E782 Mixed hyperlipidemia: Secondary | ICD-10-CM | POA: Diagnosis not present

## 2019-07-22 ENCOUNTER — Other Ambulatory Visit: Payer: Self-pay | Admitting: Family Medicine

## 2019-07-22 DIAGNOSIS — M5030 Other cervical disc degeneration, unspecified cervical region: Secondary | ICD-10-CM

## 2019-07-22 DIAGNOSIS — M159 Polyosteoarthritis, unspecified: Secondary | ICD-10-CM

## 2019-07-22 DIAGNOSIS — R32 Unspecified urinary incontinence: Secondary | ICD-10-CM

## 2019-07-22 DIAGNOSIS — E782 Mixed hyperlipidemia: Secondary | ICD-10-CM | POA: Diagnosis not present

## 2019-07-22 DIAGNOSIS — G90521 Complex regional pain syndrome I of right lower limb: Secondary | ICD-10-CM

## 2019-07-22 NOTE — Telephone Encounter (Signed)
Those orders have been placed. Please let me know if any additional orders are needed.  Matilde Haymaker, MD

## 2019-07-23 DIAGNOSIS — E782 Mixed hyperlipidemia: Secondary | ICD-10-CM | POA: Diagnosis not present

## 2019-07-24 DIAGNOSIS — E782 Mixed hyperlipidemia: Secondary | ICD-10-CM | POA: Diagnosis not present

## 2019-07-25 DIAGNOSIS — E782 Mixed hyperlipidemia: Secondary | ICD-10-CM | POA: Diagnosis not present

## 2019-07-26 ENCOUNTER — Encounter
Payer: Medicaid Other | Attending: Physical Medicine and Rehabilitation | Admitting: Physical Medicine and Rehabilitation

## 2019-07-26 ENCOUNTER — Other Ambulatory Visit: Payer: Self-pay

## 2019-07-26 ENCOUNTER — Encounter: Payer: Self-pay | Admitting: Physical Medicine and Rehabilitation

## 2019-07-26 ENCOUNTER — Other Ambulatory Visit (HOSPITAL_COMMUNITY): Payer: Self-pay | Admitting: Physical Medicine & Rehabilitation

## 2019-07-26 VITALS — BP 186/103 | HR 84 | Temp 98.9°F | Resp 16 | Ht 67.0 in | Wt 164.8 lb

## 2019-07-26 DIAGNOSIS — M5386 Other specified dorsopathies, lumbar region: Secondary | ICD-10-CM | POA: Insufficient documentation

## 2019-07-26 DIAGNOSIS — M5410 Radiculopathy, site unspecified: Secondary | ICD-10-CM | POA: Insufficient documentation

## 2019-07-26 DIAGNOSIS — M5416 Radiculopathy, lumbar region: Secondary | ICD-10-CM | POA: Diagnosis not present

## 2019-07-26 DIAGNOSIS — E782 Mixed hyperlipidemia: Secondary | ICD-10-CM | POA: Diagnosis not present

## 2019-07-26 MED ORDER — DULOXETINE HCL 30 MG PO CPEP: 30.0000 mg | ORAL_CAPSULE | Freq: Every day | ORAL | 2 refills | Status: DC

## 2019-07-26 MED ORDER — LIDOCAINE 5 % EX PTCH: 3.0000 | MEDICATED_PATCH | Freq: Two times a day (BID) | CUTANEOUS | 5 refills | Status: DC

## 2019-07-26 NOTE — Progress Notes (Addendum)
Subjective:    Patient ID: Rhonda Dominguez, female    DOB: 1961-06-03, 58 y.o.   MRN: [id number]  CC: CRPS of RLE and chronic back pain HPI; Pt is a 58 yr old  R handed Female with hx of  HTN, COPD?- breathing is bad per pt, holds urine a lot, Had previously had dx of DM, but not on meds anymore. - here for evaluation of RLE pain since surgeries and Chronic back pain- x 3 months.   Had a corn on R foot and had surgery- had another surgery on R foot for additional pain. 2018/2019 Had a "knot" on R thigh- and had surgery from that  In 2019- didn't know what the surgery was/what the knot was.  Makes it better- if lays on L side and props self, can make it better short term.  Stings and burning; and is terrible; leg can give out due to pain RLE- starts out in low back, then buttocks; radiates down RLE to toes- down back of leg initially. Back pain is aching/throbbing - R>L low back.   Tried:  hot pads-not real helpful Tylenol in past caused upset stomach ASA in past caused upset stomach Hasn't tried lidocaine patches On gabapentin 300 mg TID- no side effects Nortriptylline - 25 mg daily as needed- takes it most days Nothing else tried Tried PT- "wasn't good"  Used to fall out on RLE    Has rollator- nurse has to dress her, has pads on bed since has to hold urine  Can't do volunteer work/be active like used to be. Pain Inventory Average Pain 10 Pain Right Now 10 My pain is sharp, burning, stabbing, tingling and aching  In the last 24 hours, has pain interfered with the following? General activity 10 Relation with others 10 Enjoyment of life 10 What TIME of day is your pain at its worst? morning Sleep (in general) Fair  Pain is worse with: walking, bending, standing and some activites Pain improves with: na Relief from Meds: na  Mobility walk with assistance use a cane use a walker ability to climb steps?  no do you drive?  no needs help with transfers rollator  in house; cane in community   Function disabled: date disabled .  Neuro/Psych trouble walking  Prior Studies Any changes since last visit?  no  Physicians involved in your care Any changes since last visit?  no  Will be moving in 3 months- to a flatter apartment- more handicapped accessible No family history on file. Social History   Socioeconomic History  . Marital status: Legally Separated    Spouse name: Not on file  . Number of children: Not on file  . Years of education: Not on file  . Highest education level: Not on file  Occupational History  . Not on file  Social Needs  . Financial resource strain: Not on file  . Food insecurity    Worry: Not on file    Inability: Not on file  . Transportation needs    Medical: Not on file    Non-medical: Not on file  Tobacco Use  . Smoking status: Former Research scientist (life sciences)  . Smokeless tobacco: Never Used  . Tobacco comment: one cig a day  Substance and Sexual Activity  . Alcohol use: No  . Drug use: No  . Sexual activity: Not on file  Lifestyle  . Physical activity    Days per week: Not on file    Minutes per session: Not on  file  . Stress: Not on file  Relationships  . Social Herbalist on phone: Not on file    Gets together: Not on file    Attends religious service: Not on file    Active member of club or organization: Not on file    Attends meetings of clubs or organizations: Not on file    Relationship status: Not on file  Other Topics Concern  . Not on file  Social History Narrative  . Not on file   No past surgical history on file. Past Medical History:  Diagnosis Date  . Depression   . Diabetes mellitus without complication ([place])   . Drug abuse ([place])   . Hypertension   . Pneumothorax    There were no vitals taken for this visit. Stopped smoking 4 months ago- and wearing patches  FmHx; Mother- cancer and HTN- died 73 yrs ago Brother- cancer- died- 7 yrs ago  Opioid Risk Score:   Fall Risk  Score:  `1  Depression screen PHQ 2/9  Depression screen [facility] 2/9 07/01/2019 06/17/2019 06/07/2019 05/20/2019 02/04/2019 01/13/2019 12/24/2018  Decreased Interest 0 0 0 0 0 0 0  Down, Depressed, Hopeless 0 0 0 0 0 0 0  PHQ - 2 Score 0 0 0 0 0 0 0  Altered sleeping - - - - - - -  Tired, decreased energy - - - - - - -  Change in appetite - - - - - - -  Feeling bad or failure about yourself  - - - - - - -  Trouble concentrating - - - - - - -  Moving slowly or fidgety/restless - - - - - - -  Suicidal thoughts - - - - - - -  PHQ-9 Score - - - - - - -  Some recent data might be hidden     Review of Systems  Constitutional: Negative.   HENT: Negative.   Eyes: Negative.   Respiratory: Negative.   Cardiovascular: Negative.   Gastrointestinal: Negative.   Endocrine: Negative.   Genitourinary: Negative.   Musculoskeletal: Positive for gait problem.  Skin: Negative.   Allergic/Immunologic: Negative.   Psychiatric/Behavioral: Negative.   All other systems reviewed and are negative. no problems with bowel and bladder - doing well [place] down crossing the street- 6 months ago- multiple falls.    Objective:   Physical Exam Awake, alert, appropriate, verbose, hard ot keep on topic, appears uncomfortable in sitting position RRR CTA B/L- slightly decreased air movement B/L abd soft, NT, ND, (+)BS MS: Strength in LEs- LLE 5/5; RLE 5/5 in R HF, KE; 4-/5 in DF/PF, 5/5 in EHL on RLE Weakness could be due to pain Trigger points in lumbar paraspinals R>L And in hamstrings R>L Very sensitive to touch/TTP over scar in 1st great toe (from corn surgery)  Neuro: Sensation intact to LT B/L in LEs L1-S2 dermatomes DTRs 2+ patella in L; 1+ on R Ankles 2+ on L and 1+ on R Hyperextension L>R of lumbar spine caused increased pain SLR (+) On R only       Assessment & Plan:   1. Lumbar radiculopathy with sciatiic nerve pain on R- likely L4/5 or L5/S1 base don distribution-no Bowel or bladder Sx's.  -  Duloxetine 30 mg daily x 1 week then 60 mg daily- for nerve pain to treat Sciatica and nerve pain symptoms -side effects- most common- 1% mild nausea- x7 days, mild constipation; mild dry eyes. Don't stop  unless nausea is severe. -lidoderm patches- 3 patches- 12 hrs on;12 hrs off-   - refuses back injections of any kind- per pt request    2. Myofascial pain - tennis balls- get a tennis ball- put on back of thighs/legs and hold pressure x 5 minutes each time.  - walking with rollator or cane when safe- will improve muscle strength- 5+ days/week  3. Dispo- F/U in 2 months   BP was 186/103- but swears she was very upset when came in; cussing someone out- and then walked out before we were able to get her BP again. Will send to her PCP to recheck   I spent a total of 40 minutes on visit- more than 25 minutes educating pt on plan- tennis balls, lidocaine, and duloxetine- and side effect profile.

## 2019-07-26 NOTE — Patient Instructions (Signed)
1. Lumbar radiculopathy with sciatiic nerve pain on R- likely L4/5 or L5/S1 base don distribution-no Bowel or bladder Sx's.  - Duloxetine 30 mg daily x 1 week then 60 mg daily- for nerve pain to treat Sciatica and nerve pain symptoms -side effects- most common- 1% mild nausea- x7 days, mild constipation; mild dry eyes. Don't stop unless nausea is severe. -lidoderm patches- 3 patches- 12 hrs on;12 hrs off-   - refuses back injections of any kind- per pt request    2. Myofascial pain - tennis balls- get a tennis ball- put on back of thighs/legs and hold pressure x 5 minutes each time.  - walking with rollator or cane when safe- will improve muscle strength- 5+ days/week  3. Dispo- F/U in 2 months

## 2019-07-27 ENCOUNTER — Telehealth: Payer: Self-pay

## 2019-07-27 DIAGNOSIS — E782 Mixed hyperlipidemia: Secondary | ICD-10-CM | POA: Diagnosis not present

## 2019-07-27 NOTE — Telephone Encounter (Signed)
This was about her lidocaine patches needing a prior auth. She spoke with Zorita Pang.  I have submitted PA to [place] [employer].

## 2019-07-27 NOTE — Telephone Encounter (Signed)
Patient called and left a message on the triage line and stated that she was transferred back here by the "secretary" and she needs to speak with a clinical person today. She said it needs to be "today not tomorrow" that she receives a call back. Did not elaborate as to reasoning.

## 2019-07-27 NOTE — Telephone Encounter (Signed)
Routed to PCP. Monzerrath Mcburney, CMA  

## 2019-07-27 NOTE — Telephone Encounter (Signed)
Pt is calling back and said the order needs to be sent to the [place] office   The best phone number is [phone]  The best fax number is [phone]

## 2019-07-28 ENCOUNTER — Encounter: Payer: Self-pay | Admitting: Neurology

## 2019-07-28 ENCOUNTER — Ambulatory Visit: Payer: Medicaid Other | Admitting: Neurology

## 2019-07-28 ENCOUNTER — Other Ambulatory Visit: Payer: Self-pay

## 2019-07-28 VITALS — BP 141/96 | HR 77 | Temp 97.1°F | Ht 67.0 in | Wt 163.0 lb

## 2019-07-28 DIAGNOSIS — F3112 Bipolar disorder, current episode manic without psychotic features, moderate: Secondary | ICD-10-CM | POA: Diagnosis not present

## 2019-07-28 DIAGNOSIS — R269 Unspecified abnormalities of gait and mobility: Secondary | ICD-10-CM | POA: Diagnosis not present

## 2019-07-28 DIAGNOSIS — F17210 Nicotine dependence, cigarettes, uncomplicated: Secondary | ICD-10-CM

## 2019-07-28 DIAGNOSIS — M545 Low back pain: Secondary | ICD-10-CM | POA: Diagnosis not present

## 2019-07-28 DIAGNOSIS — R0683 Snoring: Secondary | ICD-10-CM

## 2019-07-28 DIAGNOSIS — F1410 Cocaine abuse, uncomplicated: Secondary | ICD-10-CM

## 2019-07-28 DIAGNOSIS — R32 Unspecified urinary incontinence: Secondary | ICD-10-CM | POA: Diagnosis not present

## 2019-07-28 DIAGNOSIS — E782 Mixed hyperlipidemia: Secondary | ICD-10-CM | POA: Diagnosis not present

## 2019-07-28 DIAGNOSIS — J984 Other disorders of lung: Secondary | ICD-10-CM

## 2019-07-28 NOTE — Patient Instructions (Signed)
Hypersomnia Hypersomnia is a condition in which a person feels very tired during the day even though he or she gets plenty of sleep at night. A person with this condition may take naps during the day and may find it very difficult to wake up from sleep. Hypersomnia may affect a person's ability to think, concentrate, drive, or remember things. What are the causes? The cause of this condition may not be known. Possible causes include:  Certain medicines.  Sleep disorders, such as narcolepsy and sleep apnea.  Injury to the head, brain, or spinal cord.  Drug or alcohol use.  Gastroesophageal reflux disease (GERD).  Tumors.  Certain medical conditions, such as depression, diabetes, or an underactive thyroid gland (hypothyroidism). What are the signs or symptoms? The main symptoms of hypersomnia include:  Feeling very tired throughout the day, regardless of how much sleep you got the night before.  Having trouble waking up. Others may find it difficult to wake you up when you are sleeping.  Sleeping for longer and longer periods at a time.  Taking naps throughout the day. Other symptoms may include:  Feeling restless, anxious, or annoyed.  Lacking energy.  Having trouble with: ? Remembering. ? Speaking. ? Thinking.  Loss of appetite.  Seeing, hearing, tasting, smelling, or feeling things that are not real (hallucinations). How is this diagnosed? This condition may be diagnosed based on:  Your symptoms and medical history.  Your sleeping habits. Your health care provider may ask you to write down your sleeping habits in a daily sleep log, along with any symptoms you have.  A series of tests that are done while you sleep (sleep study or polysomnogram).  A test that measures how quickly you can fall asleep during the day (daytime nap study or multiple sleep latency test). How is this treated? Treatment can help you manage your condition. Treatment may include:   Following a regular sleep routine.  Lifestyle changes, such as changing your eating habits, getting regular exercise, and avoiding alcohol or caffeinated beverages.  Taking medicines to make you more alert (stimulants) during the day.  Treating any underlying medical causes of hypersomnia. Follow these instructions at home: Sleep routine   Schedule the same bedtime and wake-up time each day.  Practice a relaxing bedtime routine. This may include reading, meditation, deep breathing, or taking a warm bath before going to sleep.  Get regular exercise each day. Avoid strenuous exercise in the evening hours.  Keep your sleep environment at a cooler temperature, darkened, and quiet.  Sleep with pillows and a mattress that are comfortable and supportive.  Schedule short 20-minute naps for when you feel sleepiest during the day.  Talk with your employer or teachers about your hypersomnia. If possible, adjust your schedule so that: ? You have a regular daytime work schedule. ? You can take a scheduled nap during the day. ? You do not have to work or be active at night.  Do not eat a heavy meal for a few hours before bedtime. Eat your meals at about the same times every day.  Avoid drinking alcohol or caffeinated beverages. Safety   Do not drive or use heavy machinery if you are sleepy. Ask your health care provider if it is safe for you to drive.  Wear a life jacket when swimming or spending time near water. General instructions  Take supplements and over-the-counter and prescription medicines only as told by your health care provider.  Keep a sleep log that will help  your doctor manage your condition. This may include information about: ? What time you go to bed each night. ? How often you wake up at night. ? How many hours you sleep at night. ? How often and for how long you nap during the day. ? Any observations from others, such as leg movements during sleep, sleep walking,  or snoring.  Keep all follow-up visits as told by your health care provider. This is important. Contact a health care provider if:  You have new symptoms.  Your symptoms get worse. Get help right away if:  You have serious thoughts about hurting yourself or someone else. If you ever feel like you may hurt yourself or others, or have thoughts about taking your own life, get help right away. You can go to your nearest emergency department or call:  Your local emergency services (911 in the [place]  A suicide crisis helpline, such as the [facility] at 325-754-9867. This is open 24 hours a day. Summary  Hypersomnia refers to a condition in which you feel very tired during the day even though you get plenty of sleep at night.  A person with this condition may take naps during the day and may find it very difficult to wake up from sleep.  Hypersomnia may affect a person's ability to think, concentrate, drive, or remember things.  Treatment, such as following a regular sleep routine and making some lifestyle changes, can help you manage your condition. This information is not intended to replace advice given to you by your health care provider. Make sure you discuss any questions you have with your health care provider. Document Released: 11/15/2002 Document Revised: 11/27/2017 Document Reviewed: 11/27/2017 Elsevier Patient Education  [employer]. Insomnia Insomnia is a sleep disorder that makes it difficult to fall asleep or stay asleep. Insomnia can cause fatigue, low energy, difficulty concentrating, mood swings, and poor performance at work or school. There are three different ways to classify insomnia:  Difficulty falling asleep.  Difficulty staying asleep.  Waking up too early in the morning. Any type of insomnia can be long-term (chronic) or short-term (acute). Both are common. Short-term insomnia usually lasts for three months or less. Chronic  insomnia occurs at least three times a week for longer than three months. What are the causes? Insomnia may be caused by another condition, situation, or substance, such as:  Anxiety.  Certain medicines.  Gastroesophageal reflux disease (GERD) or other gastrointestinal conditions.  Asthma or other breathing conditions.  Restless legs syndrome, sleep apnea, or other sleep disorders.  Chronic pain.  Menopause.  Stroke.  Abuse of alcohol, tobacco, or illegal drugs.  Mental health conditions, such as depression.  Caffeine.  Neurological disorders, such as Alzheimer's disease.  An overactive thyroid (hyperthyroidism). Sometimes, the cause of insomnia may not be known. What increases the risk? Risk factors for insomnia include:  Gender. Women are affected more often than men.  Age. Insomnia is more common as you get older.  Stress.  Lack of exercise.  Irregular work schedule or working night shifts.  Traveling between different time zones.  Certain medical and mental health conditions. What are the signs or symptoms? If you have insomnia, the main symptom is having trouble falling asleep or having trouble staying asleep. This may lead to other symptoms, such as:  Feeling fatigued or having low energy.  Feeling nervous about going to sleep.  Not feeling rested in the morning.  Having trouble concentrating.  Feeling irritable,  anxious, or depressed. How is this diagnosed? This condition may be diagnosed based on:  Your symptoms and medical history. Your health care provider may ask about: ? Your sleep habits. ? Any medical conditions you have. ? Your mental health.  A physical exam. How is this treated? Treatment for insomnia depends on the cause. Treatment may focus on treating an underlying condition that is causing insomnia. Treatment may also include:  Medicines to help you sleep.  Counseling or therapy.  Lifestyle adjustments to help you sleep  better. Follow these instructions at home: Eating and drinking   Limit or avoid alcohol, caffeinated beverages, and cigarettes, especially close to bedtime. These can disrupt your sleep.  Do not eat a large meal or eat spicy foods right before bedtime. This can lead to digestive discomfort that can make it hard for you to sleep. Sleep habits   Keep a sleep diary to help you and your health care provider figure out what could be causing your insomnia. Write down: ? When you sleep. ? When you wake up during the night. ? How well you sleep. ? How rested you feel the next day. ? Any side effects of medicines you are taking. ? What you eat and drink.  Make your bedroom a dark, comfortable place where it is easy to fall asleep. ? Put up shades or blackout curtains to block light from outside. ? Use a white noise machine to block noise. ? Keep the temperature cool.  Limit screen use before bedtime. This includes: ? Watching TV. ? Using your smartphone, tablet, or computer.  Stick to a routine that includes going to bed and waking up at the same times every day and night. This can help you fall asleep faster. Consider making a quiet activity, such as reading, part of your nighttime routine.  Try to avoid taking naps during the day so that you sleep better at night.  Get out of bed if you are still awake after 15 minutes of trying to sleep. Keep the lights down, but try reading or doing a quiet activity. When you feel sleepy, go back to bed. General instructions  Take over-the-counter and prescription medicines only as told by your health care provider.  Exercise regularly, as told by your health care provider. Avoid exercise starting several hours before bedtime.  Use relaxation techniques to manage stress. Ask your health care provider to suggest some techniques that may work well for you. These may include: ? Breathing exercises. ? Routines to release muscle tension. ? Visualizing  peaceful scenes.  Make sure that you drive carefully. Avoid driving if you feel very sleepy.  Keep all follow-up visits as told by your health care provider. This is important. Contact a health care provider if:  You are tired throughout the day.  You have trouble in your daily routine due to sleepiness.  You continue to have sleep problems, or your sleep problems get worse. Get help right away if:  You have serious thoughts about hurting yourself or someone else. If you ever feel like you may hurt yourself or others, or have thoughts about taking your own life, get help right away. You can go to your nearest emergency department or call:  Your local emergency services (911 in the [place]  A suicide crisis helpline, such as the [facility] at 502-622-8399. This is open 24 hours a day. Summary  Insomnia is a sleep disorder that makes it difficult to fall asleep or stay asleep.  Insomnia can be long-term (chronic) or short-term (acute).  Treatment for insomnia depends on the cause. Treatment may focus on treating an underlying condition that is causing insomnia.  Keep a sleep diary to help you and your health care provider figure out what could be causing your insomnia. This information is not intended to replace advice given to you by your health care provider. Make sure you discuss any questions you have with your health care provider. Document Released: 11/22/2000 Document Revised: 11/07/2017 Document Reviewed: 09/04/2017 Elsevier Patient Education  2020 [employer].

## 2019-07-28 NOTE — Telephone Encounter (Addendum)
Prior auth approved bty [employer] for Lidoderm patches APPROVED Effective Begin Date:07/27/2019 Effective End Date:07/21/2020  Confirmation #:[id number] F. Faxed to pharmacy and pt notified.
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
## 2019-07-29 ENCOUNTER — Telehealth: Payer: Self-pay | Admitting: Neurology

## 2019-07-29 ENCOUNTER — Other Ambulatory Visit: Payer: Self-pay | Admitting: Neurology

## 2019-07-29 ENCOUNTER — Telehealth: Payer: Self-pay | Admitting: Pharmacist

## 2019-07-29 DIAGNOSIS — R32 Unspecified urinary incontinence: Secondary | ICD-10-CM | POA: Diagnosis not present

## 2019-07-29 DIAGNOSIS — E782 Mixed hyperlipidemia: Secondary | ICD-10-CM | POA: Diagnosis not present

## 2019-07-29 DIAGNOSIS — R269 Unspecified abnormalities of gait and mobility: Secondary | ICD-10-CM | POA: Diagnosis not present

## 2019-07-29 DIAGNOSIS — F3112 Bipolar disorder, current episode manic without psychotic features, moderate: Secondary | ICD-10-CM

## 2019-07-29 DIAGNOSIS — M545 Low back pain: Secondary | ICD-10-CM | POA: Diagnosis not present

## 2019-07-29 DIAGNOSIS — R0683 Snoring: Secondary | ICD-10-CM

## 2019-07-29 NOTE — Telephone Encounter (Signed)
-----   Message from Leavy Cella, [facility] sent at 07/14/2019  2:23 PM EDT ----- Regarding: tobacco cessaton - Patch taper off?

## 2019-07-29 NOTE — Telephone Encounter (Signed)
Pt has Medicaid they will not cover a home sleep test. Please place an order for a in lab study. Thank you.

## 2019-07-29 NOTE — Telephone Encounter (Signed)
Patient follow-up for tobacco cessation.  Reports continued cessation and wishes to continue 7mg  patch at this time.  Agreed with her plan to continue patches as she still has a supply.  She was noticeably calm, reserved and thoughtfully concerned with her recent sleep study.  She was concerned that they told her she had "bad lung disease".   She is scheduled for an in-home machine placement (C-PAP?) early next week.    I reinforced she is doing the most important things for her lungs including her recent quit of smoking AND avoiding exposure to COVID.  She reports being very cautious RE exposure to others and routinely wearing masks.   Plan follow-up with her in 1 week. I am unsure if I will have sleep study results at that time.

## 2019-07-29 NOTE — Telephone Encounter (Signed)
Order placed

## 2019-07-30 DIAGNOSIS — E782 Mixed hyperlipidemia: Secondary | ICD-10-CM | POA: Diagnosis not present

## 2019-07-31 DIAGNOSIS — E782 Mixed hyperlipidemia: Secondary | ICD-10-CM | POA: Diagnosis not present

## 2019-08-01 DIAGNOSIS — E782 Mixed hyperlipidemia: Secondary | ICD-10-CM | POA: Diagnosis not present

## 2019-08-01 NOTE — Telephone Encounter (Signed)
I filled out paperwork for her urinary incontinence supplies this evening.  Is anything further needed? If I need to send order to a different location. Please provide details about the location.   Thank you.  Matilde Haymaker, MD

## 2019-08-02 ENCOUNTER — Telehealth: Payer: Self-pay | Admitting: Family Medicine

## 2019-08-02 DIAGNOSIS — E782 Mixed hyperlipidemia: Secondary | ICD-10-CM | POA: Diagnosis not present

## 2019-08-02 NOTE — Telephone Encounter (Signed)
Patient called RE sleep study.   I reassured her that the sleep study was needed and we could talk about options after the study was completed.  She said she was willing to do "whatever they recommended" however she was apprehensive about something "up her nose".     Her sleep study appointment is for 9/7.  More information at that time.  I encouraged her to participate in the sleep study.  She agreed to have the test.

## 2019-08-02 NOTE — Telephone Encounter (Signed)
Pt would like [facility] to call her to discuss an upcoming appt. Pt wants to know if she needs to make an appt after her sleep study is done. Please advise

## 2019-08-03 DIAGNOSIS — E782 Mixed hyperlipidemia: Secondary | ICD-10-CM | POA: Diagnosis not present

## 2019-08-03 NOTE — Telephone Encounter (Signed)
Papers faxed by provider. Salvatore Marvel, CMA\

## 2019-08-04 DIAGNOSIS — H5211 Myopia, right eye: Secondary | ICD-10-CM | POA: Diagnosis not present

## 2019-08-04 DIAGNOSIS — H524 Presbyopia: Secondary | ICD-10-CM | POA: Diagnosis not present

## 2019-08-04 DIAGNOSIS — E782 Mixed hyperlipidemia: Secondary | ICD-10-CM | POA: Diagnosis not present

## 2019-08-04 DIAGNOSIS — H10413 Chronic giant papillary conjunctivitis, bilateral: Secondary | ICD-10-CM | POA: Diagnosis not present

## 2019-08-04 DIAGNOSIS — H25813 Combined forms of age-related cataract, bilateral: Secondary | ICD-10-CM | POA: Diagnosis not present

## 2019-08-04 DIAGNOSIS — E119 Type 2 diabetes mellitus without complications: Secondary | ICD-10-CM | POA: Diagnosis not present

## 2019-08-04 LAB — HM DIABETES EYE EXAM

## 2019-08-05 DIAGNOSIS — E782 Mixed hyperlipidemia: Secondary | ICD-10-CM | POA: Diagnosis not present

## 2019-08-05 DIAGNOSIS — H5213 Myopia, bilateral: Secondary | ICD-10-CM | POA: Diagnosis not present

## 2019-08-06 DIAGNOSIS — E782 Mixed hyperlipidemia: Secondary | ICD-10-CM | POA: Diagnosis not present

## 2019-08-07 DIAGNOSIS — E782 Mixed hyperlipidemia: Secondary | ICD-10-CM | POA: Diagnosis not present

## 2019-08-08 DIAGNOSIS — E782 Mixed hyperlipidemia: Secondary | ICD-10-CM | POA: Diagnosis not present

## 2019-08-09 DIAGNOSIS — E782 Mixed hyperlipidemia: Secondary | ICD-10-CM | POA: Diagnosis not present

## 2019-08-10 DIAGNOSIS — E782 Mixed hyperlipidemia: Secondary | ICD-10-CM | POA: Diagnosis not present

## 2019-08-11 DIAGNOSIS — E782 Mixed hyperlipidemia: Secondary | ICD-10-CM | POA: Diagnosis not present

## 2019-08-12 DIAGNOSIS — E782 Mixed hyperlipidemia: Secondary | ICD-10-CM | POA: Diagnosis not present

## 2019-08-13 ENCOUNTER — Encounter: Payer: Self-pay | Admitting: Family Medicine

## 2019-08-13 ENCOUNTER — Other Ambulatory Visit: Payer: Self-pay

## 2019-08-13 ENCOUNTER — Ambulatory Visit (INDEPENDENT_AMBULATORY_CARE_PROVIDER_SITE_OTHER): Payer: Medicaid Other | Admitting: Family Medicine

## 2019-08-13 VITALS — BP 128/80 | HR 83 | Wt 159.1 lb

## 2019-08-13 DIAGNOSIS — E782 Mixed hyperlipidemia: Secondary | ICD-10-CM | POA: Diagnosis not present

## 2019-08-13 DIAGNOSIS — R7303 Prediabetes: Secondary | ICD-10-CM | POA: Diagnosis not present

## 2019-08-13 DIAGNOSIS — F17210 Nicotine dependence, cigarettes, uncomplicated: Secondary | ICD-10-CM | POA: Diagnosis not present

## 2019-08-13 DIAGNOSIS — I10 Essential (primary) hypertension: Secondary | ICD-10-CM

## 2019-08-13 DIAGNOSIS — R7309 Other abnormal glucose: Secondary | ICD-10-CM | POA: Diagnosis not present

## 2019-08-13 LAB — POCT GLYCOSYLATED HEMOGLOBIN (HGB A1C): Hemoglobin A1C: 6.2 % — AB (ref 4.0–5.6)

## 2019-08-13 NOTE — Assessment & Plan Note (Signed)
-  Continue nicotine patch -Encourage abstinence

## 2019-08-13 NOTE — Patient Instructions (Signed)
It was so nice to see you today!  We will check to make sure you blood sugar is in a healthy range.  Your blood pressure looks good.  I'm glad that you've continued to do well with the smoking cessation.    Come back in 3 months for [place] to talk again about you kidneys and liver.

## 2019-08-13 NOTE — Assessment & Plan Note (Signed)
Blood pressure well controlled in clinic today. -Continue HCTZ 25 mg daily -Continue amlodipine 10 mg daily

## 2019-08-13 NOTE — Progress Notes (Signed)
    Subjective:  Rhonda Dominguez is a 58 y.o. female who presents to the [facility] today with a chief complaint of concern for diabetes.   HPI: Concern for diabetes Rhonda Dominguez has a previous medical history significant for prediabetes with a previous A1c of 5.7.  She does not appear to have ever had an A1c above 5.7.  She is previously been on metformin and has been taken off of that medication.  She is coming to clinic today because she is concerned she may have elevated blood sugars.  There is a family history of diabetes.  She wants to know what test can be done today to assess for possible diabetes or prediabetes.  Smoking cessation She continues to wear her nicotine patches not currently smoking any cigarettes.  Hypertension She currently takes HCTZ and amlodipine for hypertension.  She denies any episodes of dizziness or lightheadedness.  She is happy with her current medication.  Concern for obstructive sleep apnea She has been scheduled for a sleep study to be performed.  She is concerned about what the results of the sleep study might mean.    Chief Complaint noted Review of Symptoms - see HPI PMH - Smoking status noted.    Objective:  Physical Exam: BP 128/80   Pulse 83   Wt 159 lb 2 oz (72.2 kg)   SpO2 97%   BMI 24.92 kg/m    Gen: NAD, resting comfortably.  Energetic.   CV: RRR with no murmurs appreciated Pulm: NWOB, CTAB with no crackles, wheezes, or rhonchi GI: Normal bowel sounds present. Soft, Nontender, Nondistended. MSK: no edema, cyanosis, or clubbing noted Skin: warm, dry   Results for orders placed or performed in visit on 08/13/19 (from the past 72 hour(s))  HgB A1c     Status: Abnormal   Collection Time: 08/13/19  1:57 PM  Result Value Ref Range   Hemoglobin A1C 6.2 (A) 4.0 - 5.6 %   HbA1c POC (<> result, manual entry)     HbA1c, POC (prediabetic range)     HbA1c, POC (controlled diabetic range)       Assessment/Plan:  Essential hypertension  Blood pressure well controlled in clinic today. -Continue HCTZ 25 mg daily -Continue amlodipine 10 mg daily  Nicotine dependence -Continue nicotine patch -Encourage abstinence  Prediabetes A1c returned today at 6.2.  Consistent with her previous medical history of prediabetes.  Benefit of diet and exercise was discussed with the patient. -Continue treatment with diet and exercise -Monitor A1c in 3 months   Concern for OSA -Follow-up sleep study

## 2019-08-13 NOTE — Assessment & Plan Note (Signed)
A1c returned today at 6.2.  Consistent with her previous medical history of prediabetes.  Benefit of diet and exercise was discussed with the patient. -Continue treatment with diet and exercise -Monitor A1c in 3 months

## 2019-08-14 DIAGNOSIS — E782 Mixed hyperlipidemia: Secondary | ICD-10-CM | POA: Diagnosis not present

## 2019-08-15 DIAGNOSIS — E782 Mixed hyperlipidemia: Secondary | ICD-10-CM | POA: Diagnosis not present

## 2019-08-16 ENCOUNTER — Ambulatory Visit (INDEPENDENT_AMBULATORY_CARE_PROVIDER_SITE_OTHER): Payer: Medicaid Other | Admitting: Neurology

## 2019-08-16 DIAGNOSIS — G4733 Obstructive sleep apnea (adult) (pediatric): Secondary | ICD-10-CM

## 2019-08-16 DIAGNOSIS — F3112 Bipolar disorder, current episode manic without psychotic features, moderate: Secondary | ICD-10-CM

## 2019-08-16 DIAGNOSIS — E782 Mixed hyperlipidemia: Secondary | ICD-10-CM | POA: Diagnosis not present

## 2019-08-16 DIAGNOSIS — R5383 Other fatigue: Secondary | ICD-10-CM

## 2019-08-16 DIAGNOSIS — R0683 Snoring: Secondary | ICD-10-CM

## 2019-08-17 DIAGNOSIS — E782 Mixed hyperlipidemia: Secondary | ICD-10-CM | POA: Diagnosis not present

## 2019-08-18 DIAGNOSIS — E782 Mixed hyperlipidemia: Secondary | ICD-10-CM | POA: Diagnosis not present

## 2019-08-19 ENCOUNTER — Telehealth: Payer: Self-pay | Admitting: Family Medicine

## 2019-08-19 DIAGNOSIS — E782 Mixed hyperlipidemia: Secondary | ICD-10-CM | POA: Diagnosis not present

## 2019-08-19 NOTE — Telephone Encounter (Signed)
Spoke with pt. Made appt for 9/24 at 11:25a virtual. Salvatore Marvel, CMA

## 2019-08-19 NOTE — Telephone Encounter (Signed)
Ps calling and would like to speak to Dr. Pilar Plate about several things. jw

## 2019-08-20 DIAGNOSIS — E782 Mixed hyperlipidemia: Secondary | ICD-10-CM | POA: Diagnosis not present

## 2019-08-21 DIAGNOSIS — E782 Mixed hyperlipidemia: Secondary | ICD-10-CM | POA: Diagnosis not present

## 2019-08-22 DIAGNOSIS — E782 Mixed hyperlipidemia: Secondary | ICD-10-CM | POA: Diagnosis not present

## 2019-08-23 DIAGNOSIS — E782 Mixed hyperlipidemia: Secondary | ICD-10-CM | POA: Diagnosis not present

## 2019-08-24 DIAGNOSIS — E782 Mixed hyperlipidemia: Secondary | ICD-10-CM | POA: Diagnosis not present

## 2019-08-25 ENCOUNTER — Telehealth: Payer: Self-pay | Admitting: Family Medicine

## 2019-08-25 DIAGNOSIS — E782 Mixed hyperlipidemia: Secondary | ICD-10-CM | POA: Diagnosis not present

## 2019-08-25 NOTE — Telephone Encounter (Signed)
Pt noted that the study was done on 9/7.  She was informed that she would be notified as soon as the results are available.  Matilde Haymaker, MD

## 2019-08-25 NOTE — Telephone Encounter (Signed)
Patient had an appointment last week with [facility] to assess her snoring and there is no sleep study in chart.  Doesn't seem that it has been done yet.  Shanay Woolman,CMA

## 2019-08-25 NOTE — Telephone Encounter (Signed)
Patient contacted in follow-up of tobacco cessation and return of phone call related to "breating test results".   Patient continues to report abstinence from tobacco.  States she is ready to stop patches when she uses the final 5 patches she has.  I congratulated her on her success.   Two requests from her. 1) Let her know what the sleep study results say - I shared that I did not see them in her chart and I would ask her PCP, Dr. Pilar Plate for assistance with this request.   2) Requests a letter for her breathing to support a change in her housing situation.  She reports she has mold and mildew in her air conditioner.  She would like a letter to help her with relocation into alternative housing.   She was persistent about these requests.  I shared that we would attempt to complete both within the next week (by the time of her next visit with Dr. Pilar Plate on 9/24).

## 2019-08-25 NOTE — Telephone Encounter (Signed)
Pt is calling and would like for Dr. Valentina Lucks to call her to discuss a new breathing machine and to see when she needs to schedule her next appointment to see him.

## 2019-08-26 ENCOUNTER — Telehealth: Payer: Self-pay | Admitting: *Deleted

## 2019-08-26 ENCOUNTER — Encounter: Payer: Self-pay | Admitting: Physical Medicine and Rehabilitation

## 2019-08-26 ENCOUNTER — Other Ambulatory Visit: Payer: Self-pay

## 2019-08-26 ENCOUNTER — Encounter
Payer: Medicaid Other | Attending: Physical Medicine and Rehabilitation | Admitting: Physical Medicine and Rehabilitation

## 2019-08-26 VITALS — BP 128/86 | HR 78 | Temp 97.7°F | Ht 67.0 in | Wt 160.8 lb

## 2019-08-26 DIAGNOSIS — M5410 Radiculopathy, site unspecified: Secondary | ICD-10-CM

## 2019-08-26 DIAGNOSIS — M5386 Other specified dorsopathies, lumbar region: Secondary | ICD-10-CM | POA: Diagnosis not present

## 2019-08-26 DIAGNOSIS — Z79891 Long term (current) use of opiate analgesic: Secondary | ICD-10-CM | POA: Diagnosis not present

## 2019-08-26 DIAGNOSIS — G894 Chronic pain syndrome: Secondary | ICD-10-CM

## 2019-08-26 DIAGNOSIS — M5416 Radiculopathy, lumbar region: Secondary | ICD-10-CM | POA: Insufficient documentation

## 2019-08-26 DIAGNOSIS — Z5181 Encounter for therapeutic drug level monitoring: Secondary | ICD-10-CM | POA: Diagnosis not present

## 2019-08-26 DIAGNOSIS — G8929 Other chronic pain: Secondary | ICD-10-CM

## 2019-08-26 DIAGNOSIS — M549 Dorsalgia, unspecified: Secondary | ICD-10-CM

## 2019-08-26 DIAGNOSIS — E782 Mixed hyperlipidemia: Secondary | ICD-10-CM | POA: Diagnosis not present

## 2019-08-26 MED ORDER — TRAMADOL HCL 50 MG PO TABS: 100.0000 mg | ORAL_TABLET | Freq: Two times a day (BID) | ORAL | 1 refills | Status: DC

## 2019-08-26 NOTE — Patient Instructions (Signed)
Pt is a 58 yr old female with R sided L4/5 Sciatica/radiculopathy  1. Refused  L4/5 epidural injections since doesn't "do needles". Went over risks/benefits- pt decided against needles.  2. Can try Tramadol 100 mg BID as needed for pain- #120.  3. Con't Mobic 7.5 mg 2x/day  4. Con't Duloxetine 60 mg daily- for nerve pain.  5. Will need oral drug screen and pain contract.  6. Needs to get started with walking again.  7. F/U in 2 months

## 2019-08-26 NOTE — Telephone Encounter (Signed)
Prior auth initiated with [employer] for Tramadol 50 mg 2 po bid #120.

## 2019-08-26 NOTE — Addendum Note (Signed)
Addended by: Jasmine December T on: 08/26/2019 10:38 AM   Modules accepted: Orders

## 2019-08-26 NOTE — Progress Notes (Signed)
Subjective:    Patient ID: Rhonda Dominguez, female    DOB: Sep 10, 1961, 58 y.o.   MRN: [id number]  HPI   Does exercises every day. Doesn't go outside; doesn't do activities.   Has a nurse 7 days/week. Didn't sleep last night because of the pain- weather related?   Taking Duloxetine 60 mg daily- has helped a little bit, but lidocaine patches haven't helped at all.  Is having so much back pain, won't get out of bed. Barely getting out of the house due to pain.  In senior's citizen's area, so doesn't get visitors.  Doctor wants her to use her walker/rollator to walk around.  Thinks Oxycodone 5-7.5 mg would be helpful. Dr Glo Herring- he retired- "out of business".  Tingling feeling from B/L low back all the way to the R foot/toes.    Pain Inventory Average Pain 10 Pain Right Now 10 My pain is constant, sharp, burning, dull, stabbing, tingling and aching  In the last 24 hours, has pain interfered with the following? General activity 10 Relation with others 10 Enjoyment of life 10 What TIME of day is your pain at its worst? all Sleep (in general) Poor  Pain is worse with: walking, bending, standing and some activites Pain improves with: nothing Relief from Meds: 0  Mobility use a cane how many minutes can you walk? 30 ability to climb steps?  no do you drive?  no  Function disabled: date disabled . retired I need assistance with the following:  dressing, bathing, meal prep, household duties and shopping  Neuro/Psych weakness numbness tingling trouble walking  Prior Studies Any changes since last visit?  no  Physicians involved in your care Any changes since last visit?  no   No family history on file. Social History   Socioeconomic History  . Marital status: Legally Separated    Spouse name: Not on file  . Number of children: Not on file  . Years of education: Not on file  . Highest education level: Not on file  Occupational History  . Not on  file  Social Needs  . Financial resource strain: Not on file  . Food insecurity    Worry: Not on file    Inability: Not on file  . Transportation needs    Medical: Not on file    Non-medical: Not on file  Tobacco Use  . Smoking status: Former Smoker    Quit date: 06/25/2019    Years since quitting: 0.1  . Smokeless tobacco: Never Used  . Tobacco comment: using nicoderm patches  Substance and Sexual Activity  . Alcohol use: No  . Drug use: No  . Sexual activity: Not on file  Lifestyle  . Physical activity    Days per week: Not on file    Minutes per session: Not on file  . Stress: Not on file  Relationships  . Social Herbalist on phone: Not on file    Gets together: Not on file    Attends religious service: Not on file    Active member of club or organization: Not on file    Attends meetings of clubs or organizations: Not on file    Relationship status: Not on file  Other Topics Concern  . Not on file  Social History Narrative  . Not on file   No past surgical history on file. Past Medical History:  Diagnosis Date  . Depression   . Diabetes mellitus without complication ([place])   .  Drug abuse ([place])   . Hypertension   . Pneumothorax    BP 128/86   Pulse 78   Temp 97.7 F (36.5 C)   Ht 5\' 7"  (1.702 m)   Wt 160 lb 12.8 oz (72.9 kg)   SpO2 96%   BMI 25.18 kg/m   Opioid Risk Score:   Fall Risk Score:  `1  Depression screen PHQ 2/9  Depression screen [facility] 2/9 08/26/2019 08/13/2019 07/01/2019 06/17/2019 06/07/2019 05/20/2019 02/04/2019  Decreased Interest 0 0 0 0 0 0 0  Down, Depressed, Hopeless 0 0 0 0 0 0 0  PHQ - 2 Score 0 0 0 0 0 0 0  Altered sleeping - - - - - - -  Tired, decreased energy - - - - - - -  Change in appetite - - - - - - -  Feeling bad or failure about yourself  - - - - - - -  Trouble concentrating - - - - - - -  Moving slowly or fidgety/restless - - - - - - -  Suicidal thoughts - - - - - - -  PHQ-9 Score - - - - - - -  Some recent data  might be hidden    Review of Systems  Constitutional: Negative.   HENT: Negative.   Eyes: Negative.   Respiratory: Positive for apnea and shortness of breath.   Cardiovascular: Negative.   Gastrointestinal: Negative.   Endocrine: Negative.   Genitourinary: Negative.   Musculoskeletal: Positive for back pain and gait problem.  Skin: Negative.   Allergic/Immunologic: Negative.   Neurological: Positive for weakness and numbness.       Tingling  Hematological: Negative.   Psychiatric/Behavioral: Positive for agitation.       Gets angry easily  All other systems reviewed and are negative.      Objective:   Physical Exam  Awake, alert, appropriate, pressure speech, sitting with arms behind back, NAD Has cane with her- not walker TTP over  Low back/lumbar paraspinals Said doesn't feel good/irriitated Numbness in R foot L4/5 distribution      Assessment & Plan:  Pt is a 58 yr old female with R sided L4/5 Sciatica/radiculopathy  1. Refused  L4/5 epidural injections since doesn't "do needles". Went over risks/benefits- pt decided against needles.  2. Can try Tramadol 100 mg BID as needed for pain- #120.  3. Con't Mobic 7.5 mg 2x/day  4. Con't Duloxetine 60 mg daily- for nerve pain.  5. Will need oral drug screen and pain contract.  6. Needs to get started with walking again.  7. F/U in 2 months

## 2019-08-26 NOTE — Telephone Encounter (Signed)
Alinda Sierras called from [place] and there is still a medicaid issue with Dr Dagoberto Ligas so they need another provider to prescribe the tramadol. I ha called back to pharmacy and filled Rx under Danella Sensing NP and made [place] aware.

## 2019-08-27 DIAGNOSIS — R5383 Other fatigue: Secondary | ICD-10-CM | POA: Insufficient documentation

## 2019-08-27 DIAGNOSIS — E782 Mixed hyperlipidemia: Secondary | ICD-10-CM | POA: Diagnosis not present

## 2019-08-27 NOTE — Procedures (Signed)
PATIENT'S NAME:  Rhonda Dominguez, Age DOB:      [birth year]      MR#:    [id number]     DATE OF RECORDING: 08/16/2019 REFERRING M.D.:  Matilde Haymaker, MD- Family Practice at [facility] Study Performed:   Baseline Polysomnogram HISTORY:  58 y.o. year old African American female patient seen here upon a referral on 07/28/2019. Chief concern according to my RN: "The patient complains of feeling tired and fatigue and is worried that she has developed sleep apnea".   I am seeing EYLIN STRING today, a right -handed African American female with non-existent sleep routines, bed time, wake time, free running cycle of sleep and wake periods.  She has Bipolar Depression, Diabetes mellitus without complication ([place]), Drug abuse / polysubstance abuse ([place]), Hypertension, active history of tobacco use, and history of Pneumothorax.  She is an active smoker, and she has a history depression, alternating with euphoria, affecting her sleep.  She reports having" bad nerves", and having to go to the bathroom 4-5 times at night.   The patient had a previous sleep study in the year 2012 with a result of an AHI (Apnea Hypopnea index) of 2.4 /h.  Her mother had OSA, was oxygen dependent.   Social history: Patient is disabled since 2011 and lives alone. She denies any tobacco use currently. She is using a "tobacco patch", she has vaped. ETOH use - she quit (when?) Regular exercise: none. "I have a nurse 3 hours a day".  The patient endorsed the Epworth Sleepiness Scale at 12 points. FSS at 63/63 points.  The patient's weight 163 pounds with a height of 67 (inches), resulting in a BMI of 25.6 kg/m2. The patient's neck circumference measured 14.5 inches.  CURRENT MEDICATIONS: Ventolin inhaler.  Nicotrol patch, Norvasc, Aspirin, Voltaren, Cymbalta, Pepcid, Neurontin, Hydrochlorothiazide, Vistaril, Mobic, Lidoderm, Nicoderm, Pamelor, Kenalog   PROCEDURE:  This is a multichannel digital polysomnogram utilizing the Somnostar 11.2  system.  Electrodes and sensors were applied and monitored per AASM Specifications.   EEG, EOG, Chin and Limb EMG, were sampled at 200 Hz.  ECG, Snore and Nasal Pressure, Thermal Airflow, Respiratory Effort, CPAP Flow and Pressure, Oximetry was sampled at 50 Hz. Digital video and audio were recorded.      BASELINE STUDY: Lights Out was at 21:01 and Lights On at 05:01.  Total recording time (TRT) was 480.5 minutes, with a total sleep time (TST) of 343.5 minutes.   The patient's sleep latency was 71.5 minutes.  REM latency was 57 minutes.  The sleep efficiency was 71.5 %.     SLEEP ARCHITECTURE: WASO (Wake after sleep onset) was 65 minutes.  There were 40.5 minutes in Stage N1, 102 minutes Stage N2, 126 minutes Stage N3 and 75 minutes in Stage REM.  The percentage of Stage N1 was 11.8%, Stage N2 was 29.7%, Stage N3 was 36.7% and Stage R (REM sleep) was 21.8%.     RESPIRATORY ANALYSIS:  There were a total of 9 respiratory events:  4 obstructive apneas, 0 central apneas and 0 mixed apneas with 5 hypopneas. The patient also had several respiratory event related arousals (RERAs).  The patient snored loudly throughout the sleep periods.    The total APNEA/HYPOPNEA INDEX (AHI) was 1.6 /hour.  7 events occurred in REM sleep and 2 events in NREM. The REM AHI was 5.6 /hour, versus a non-REM AHI of 0.4. The patient spent 343.5 minutes of total sleep time in the supine position and 0 minutes in  non-supine. The supine AHI was 1.6 versus a non-supine AHI of 0.0.  OXYGEN SATURATION & C02:  The Wake baseline 02 saturation was 96%, with the lowest being 78%. Time spent below 89% saturation equaled 10 minutes. The patient had a total of 0 Periodic Limb Movements.  The arousals were noted as: 50 were spontaneous, none were associated with PLMs or with respiratory events.  Audio and video analysis did not show any abnormal or unusual movements, behaviors, phonations or vocalizations.   EKG was in keeping with normal sinus  rhythm (NSR). Post-study, the patient indicated that sleep was deeper than usual.   IMPRESSION: 1) The patient's fatigue is not due to sleep disordered breathing.  She is not hypoxic.  She snores when in supine sleep position.  Her EKG is regular, and no restless legs / PLMs were noted. There is no organic sleep disorder present.    RECOMMENDATIONS: No follow up in sleep clinic. The patient needs to address her sleep habits, establish bed times and wake times and an activity schedule for the daytime to avoid sleeping/ dozing off. She was given a pamphlet for better sleep hygiene.  This falls into behavior therapy and can be addressed with her mental/ behavioral health provider.     I certify that I have reviewed the entire raw data recording prior to the issuance of this report in accordance with the Standards of Accreditation of the American Academy of Sleep Medicine (AASM)  Larey Seat, MD   08-27-2019 Diplomat, American Board of Psychiatry and Neurology  Diplomat, American Board of [place] Director, [employer] [facility] [employer]

## 2019-08-27 NOTE — Progress Notes (Signed)
Please call Ms. Rhonda Dominguez and inform her that her sleep study shows no evidence of sleep apnea.  This is great news! It means that there is no reason for her to need to use a CPAP at night!  Thank you, Matilde Haymaker, MD

## 2019-08-28 DIAGNOSIS — E782 Mixed hyperlipidemia: Secondary | ICD-10-CM | POA: Diagnosis not present

## 2019-08-29 DIAGNOSIS — E782 Mixed hyperlipidemia: Secondary | ICD-10-CM | POA: Diagnosis not present

## 2019-08-30 ENCOUNTER — Telehealth: Payer: Self-pay | Admitting: Neurology

## 2019-08-30 ENCOUNTER — Telehealth: Payer: Self-pay | Admitting: Pharmacist

## 2019-08-30 ENCOUNTER — Other Ambulatory Visit: Payer: Self-pay | Admitting: Family Medicine

## 2019-08-30 DIAGNOSIS — E782 Mixed hyperlipidemia: Secondary | ICD-10-CM | POA: Diagnosis not present

## 2019-08-30 NOTE — Progress Notes (Signed)
Patient informed of results. Bedie Dominey,CMA  

## 2019-08-30 NOTE — Telephone Encounter (Signed)
-----   Message from Larey Seat, MD sent at 08/27/2019  1:47 PM EDT ----- 1) The patient's fatigue is not due to sleep disordered breathing.  She is not hypoxic.  She snores when in supine sleep position.  Her EKG is regular, and no restless legs / PLMs were noted. There is no organic sleep disorder present.    RECOMMENDATIONS: No follow up in sleep clinic is planned / appropriate.   The patient needs to address her sleep habits, establish bed times and wake times and an activity schedule for the daytime to avoid sleeping/ dozing off. She was given a pamphlet for better sleep hygiene.  This falls into behavior therapy and can be addressed with her mental/ behavioral health provider

## 2019-08-30 NOTE — Telephone Encounter (Signed)
Called patient at the resquest of clinic nurse following phone call this AM.    She was informed of her normal sleep study results today.  She was happy with the results.  She reports NOT longer using nicotine patches AND remains tobacco free X 3 months.  Reinforced need for continued abstinence from tobacco and encouraged success.   Reminded patient she will have PCP telephone visit on 9/25 (Friday) at 11:25.  She verbalized understanding that this appointment was with her PCP, Dr. Pilar Plate.   I will plan to follow up with her in ~ 1 month to reassess tobacco cessation status and encourage continued abstinence.

## 2019-08-30 NOTE — Telephone Encounter (Signed)
Called and discussed with the patient about her sleep study results. Informed the patient that there was no evidence of organic sleep disorder present. Advised that the pt Dr Dohmeier recommends the pt practice sleep hygiene habits such as establishing bedtime routines. Patient then stated for me to "hold on, hold on" I called her name no reply and waited for 2 min and connection was lost.   IF patient calls back, I have already advised of the findings and was done with the call unless patient has questions.

## 2019-08-30 NOTE — Telephone Encounter (Addendum)
Prior auth for Tramadol through [employer]  Status:APPROVED Effective Begin Date:08/26/2019  Effective End Date:02/22/2020. Confirmation #:[id number]  Patient and pharmacy notified.

## 2019-08-31 DIAGNOSIS — E782 Mixed hyperlipidemia: Secondary | ICD-10-CM | POA: Diagnosis not present

## 2019-09-01 DIAGNOSIS — E782 Mixed hyperlipidemia: Secondary | ICD-10-CM | POA: Diagnosis not present

## 2019-09-01 LAB — DRUG TOX MONITOR 1 W/CONF, ORAL FLD
Amphetamines: NEGATIVE ng/mL (ref <10)
Barbiturates: NEGATIVE ng/mL (ref <10)
Benzodiazepines: NEGATIVE ng/mL (ref <0.50)
Benzoylecgonine: 16 ng/mL — ABNORMAL HIGH (ref <5.0)
Buprenorphine: NEGATIVE ng/mL (ref <0.10)
Cocaine: NEGATIVE ng/mL (ref <5.0)
Cocaine: POSITIVE ng/mL — AB (ref <5.0)
Cotinine: 32.3 ng/mL — ABNORMAL HIGH (ref <5.0)
Fentanyl: NEGATIVE ng/mL (ref <0.10)
Heroin Metabolite: NEGATIVE ng/mL (ref <1.0)
MARIJUANA: NEGATIVE ng/mL (ref <2.5)
MDMA: NEGATIVE ng/mL (ref <10)
Meprobamate: NEGATIVE ng/mL (ref <2.5)
Methadone: NEGATIVE ng/mL (ref <5.0)
Nicotine Metabolite: POSITIVE ng/mL — AB (ref <5.0)
Opiates: NEGATIVE ng/mL (ref <2.5)
Phencyclidine: NEGATIVE ng/mL (ref <10)
Tapentadol: NEGATIVE ng/mL (ref <5.0)
Tramadol: NEGATIVE ng/mL (ref <5.0)
Zolpidem: NEGATIVE ng/mL (ref <5.0)

## 2019-09-01 LAB — DRUG TOX ALC METAB W/CON, ORAL FLD: Alcohol Metabolite: NEGATIVE ng/mL (ref <25)

## 2019-09-02 ENCOUNTER — Telehealth: Payer: Self-pay | Admitting: *Deleted

## 2019-09-02 DIAGNOSIS — E782 Mixed hyperlipidemia: Secondary | ICD-10-CM | POA: Diagnosis not present

## 2019-09-02 NOTE — Telephone Encounter (Signed)
Oral swab drug screen was positive for benzoylecgonine which is a metabolite of cocaine. Please advise

## 2019-09-03 ENCOUNTER — Other Ambulatory Visit: Payer: Self-pay

## 2019-09-03 ENCOUNTER — Telehealth (INDEPENDENT_AMBULATORY_CARE_PROVIDER_SITE_OTHER): Payer: Medicaid Other | Admitting: Family Medicine

## 2019-09-03 DIAGNOSIS — R7303 Prediabetes: Secondary | ICD-10-CM | POA: Diagnosis not present

## 2019-09-03 DIAGNOSIS — E782 Mixed hyperlipidemia: Secondary | ICD-10-CM | POA: Diagnosis not present

## 2019-09-03 MED ORDER — TRIAMCINOLONE ACETONIDE 0.1 % EX CREA: TOPICAL_CREAM | Freq: Four times a day (QID) | CUTANEOUS | 2 refills | Status: DC | PRN

## 2019-09-03 NOTE — Progress Notes (Signed)
[place] Telemedicine Visit  Patient consented to have virtual visit. Method of visit: Telephone  Encounter participants: Patient: Rhonda Dominguez - located at Home Provider: Matilde Haymaker - located at [facility] Others (if applicable): none  Chief Complaint: none  HPI:  Sleep study Rhonda Dominguez was informed that the results of her sleep study showed that she had no evidence of obstructive sleep apnea.  No CPAP is necessary at this time.  She was informed of these results.  Prediabetes She wanted to know if there was any need to discuss her prediabetes at this time.  She continues to focus on nutrition and exercise.  She reports that she has made several dietary changes and is attempting to use whole-wheat bread and brown rice.  She is aware that her next follow-up appointment is not until December.  Rash/excoriations She continues to have mild scabbing/scarring from her neurotic excoriations.  She requested a refill of her triamcinolone cream 0.1%.  ROS: per HPI  Pertinent PMHx: Prediabetes  Exam:  Respiratory: Breathing comfortably on room air.  No difficulty completing long sentences without effort.  Assessment/Plan:  Sleep study -No further work-up at this time  Prediabetes -Continue nutrition and exercise -Follow-up in December  Rash/excoriations -Triamcinolone cream 0.1% refilled  Time spent during visit with patient: 15 minutes

## 2019-09-04 DIAGNOSIS — E782 Mixed hyperlipidemia: Secondary | ICD-10-CM | POA: Diagnosis not present

## 2019-09-05 DIAGNOSIS — E782 Mixed hyperlipidemia: Secondary | ICD-10-CM | POA: Diagnosis not present

## 2019-09-06 DIAGNOSIS — E782 Mixed hyperlipidemia: Secondary | ICD-10-CM | POA: Diagnosis not present

## 2019-09-07 DIAGNOSIS — E782 Mixed hyperlipidemia: Secondary | ICD-10-CM | POA: Diagnosis not present

## 2019-09-08 DIAGNOSIS — E782 Mixed hyperlipidemia: Secondary | ICD-10-CM | POA: Diagnosis not present

## 2019-09-09 DIAGNOSIS — E782 Mixed hyperlipidemia: Secondary | ICD-10-CM | POA: Diagnosis not present

## 2019-09-10 DIAGNOSIS — E782 Mixed hyperlipidemia: Secondary | ICD-10-CM | POA: Diagnosis not present

## 2019-09-11 DIAGNOSIS — E782 Mixed hyperlipidemia: Secondary | ICD-10-CM | POA: Diagnosis not present

## 2019-09-12 DIAGNOSIS — E782 Mixed hyperlipidemia: Secondary | ICD-10-CM | POA: Diagnosis not present

## 2019-09-13 ENCOUNTER — Other Ambulatory Visit: Payer: Self-pay | Admitting: Family Medicine

## 2019-09-13 DIAGNOSIS — M5410 Radiculopathy, site unspecified: Secondary | ICD-10-CM

## 2019-09-13 DIAGNOSIS — M5416 Radiculopathy, lumbar region: Secondary | ICD-10-CM

## 2019-09-13 DIAGNOSIS — M5386 Other specified dorsopathies, lumbar region: Secondary | ICD-10-CM

## 2019-09-13 DIAGNOSIS — E782 Mixed hyperlipidemia: Secondary | ICD-10-CM | POA: Diagnosis not present

## 2019-09-13 MED ORDER — LIDOCAINE 5 % EX PTCH: 3.0000 | MEDICATED_PATCH | Freq: Two times a day (BID) | CUTANEOUS | 0 refills | Status: AC

## 2019-09-13 NOTE — Telephone Encounter (Signed)
Pt would like to know if she can have her lidocaine refilled.

## 2019-09-14 ENCOUNTER — Encounter: Payer: Self-pay | Admitting: Family Medicine

## 2019-09-14 ENCOUNTER — Other Ambulatory Visit: Payer: Self-pay | Admitting: Family Medicine

## 2019-09-14 DIAGNOSIS — M5410 Radiculopathy, site unspecified: Secondary | ICD-10-CM

## 2019-09-14 DIAGNOSIS — M5416 Radiculopathy, lumbar region: Secondary | ICD-10-CM

## 2019-09-14 DIAGNOSIS — E782 Mixed hyperlipidemia: Secondary | ICD-10-CM | POA: Diagnosis not present

## 2019-09-14 DIAGNOSIS — M5386 Other specified dorsopathies, lumbar region: Secondary | ICD-10-CM

## 2019-09-14 DIAGNOSIS — R21 Rash and other nonspecific skin eruption: Secondary | ICD-10-CM

## 2019-09-14 NOTE — Telephone Encounter (Signed)
Duplicate request. Mustapha Colson,CMA  

## 2019-09-14 NOTE — Telephone Encounter (Signed)
Patient is very upset that the pharmacy does not have her script for Lidocaine yet.  She is asking for nurse to call her this morning about this asap.

## 2019-09-14 NOTE — Telephone Encounter (Signed)
Spoke with patient and she requested the wrong medication.  She needs a refill request of her hydroxyzine.  Patient was very rude and wouldn't allow me to explain to her what happened.  Will have provider call patient and let her know that we are unable to help her is she doesn't allow [place] to speak and that we do have a refill policy.  Jazmin Hartsell,CMA

## 2019-09-15 DIAGNOSIS — E782 Mixed hyperlipidemia: Secondary | ICD-10-CM | POA: Diagnosis not present

## 2019-09-15 MED ORDER — HYDROXYZINE HCL 25 MG PO TABS: 25.0000 mg | ORAL_TABLET | Freq: Three times a day (TID) | ORAL | 0 refills | Status: DC | PRN

## 2019-09-16 DIAGNOSIS — E782 Mixed hyperlipidemia: Secondary | ICD-10-CM | POA: Diagnosis not present

## 2019-09-17 DIAGNOSIS — H524 Presbyopia: Secondary | ICD-10-CM | POA: Diagnosis not present

## 2019-09-17 DIAGNOSIS — E782 Mixed hyperlipidemia: Secondary | ICD-10-CM | POA: Diagnosis not present

## 2019-09-18 DIAGNOSIS — E782 Mixed hyperlipidemia: Secondary | ICD-10-CM | POA: Diagnosis not present

## 2019-09-19 DIAGNOSIS — E782 Mixed hyperlipidemia: Secondary | ICD-10-CM | POA: Diagnosis not present

## 2019-09-20 DIAGNOSIS — E782 Mixed hyperlipidemia: Secondary | ICD-10-CM | POA: Diagnosis not present

## 2019-09-21 DIAGNOSIS — E782 Mixed hyperlipidemia: Secondary | ICD-10-CM | POA: Diagnosis not present

## 2019-09-22 DIAGNOSIS — E782 Mixed hyperlipidemia: Secondary | ICD-10-CM | POA: Diagnosis not present

## 2019-09-23 DIAGNOSIS — E782 Mixed hyperlipidemia: Secondary | ICD-10-CM | POA: Diagnosis not present

## 2019-09-24 ENCOUNTER — Ambulatory Visit: Payer: Medicaid Other | Admitting: Physical Medicine and Rehabilitation

## 2019-09-24 DIAGNOSIS — E782 Mixed hyperlipidemia: Secondary | ICD-10-CM | POA: Diagnosis not present

## 2019-09-25 DIAGNOSIS — E782 Mixed hyperlipidemia: Secondary | ICD-10-CM | POA: Diagnosis not present

## 2019-09-26 DIAGNOSIS — E782 Mixed hyperlipidemia: Secondary | ICD-10-CM | POA: Diagnosis not present

## 2019-09-27 DIAGNOSIS — E782 Mixed hyperlipidemia: Secondary | ICD-10-CM | POA: Diagnosis not present

## 2019-09-28 DIAGNOSIS — E782 Mixed hyperlipidemia: Secondary | ICD-10-CM | POA: Diagnosis not present

## 2019-09-29 ENCOUNTER — Telehealth: Payer: Self-pay | Admitting: Family Medicine

## 2019-09-29 DIAGNOSIS — E782 Mixed hyperlipidemia: Secondary | ICD-10-CM | POA: Diagnosis not present

## 2019-09-29 NOTE — Telephone Encounter (Signed)
Patient called today to see if Dr.Koval could give her a call. Please give pt a call at your earliest convenience.

## 2019-09-29 NOTE — Telephone Encounter (Signed)
Returned call at patient request.   Patient requested a letter RE her lungs that she could use to facilitate an expedited move from her apartment which she is concerned has mold in the air conditioning system.   She requested a letter RE her lungs be sent to her apartment/home address ASAP.   She reports continued abstinence from tobacco (smoking) and states confidence in remaining smoke free.  She continues on Stiolto (tiotropium and olodaterol) and albuterol at this time.  She states her breathing is doing fine.   Encouraged continued abstinence and continuation of her inhalers at this time.  Also encouraged flu shot which she will get on "Friday" - in two days.   No follow - up planned at this time.

## 2019-09-30 DIAGNOSIS — E782 Mixed hyperlipidemia: Secondary | ICD-10-CM | POA: Diagnosis not present

## 2019-10-01 DIAGNOSIS — E782 Mixed hyperlipidemia: Secondary | ICD-10-CM | POA: Diagnosis not present

## 2019-10-02 DIAGNOSIS — E782 Mixed hyperlipidemia: Secondary | ICD-10-CM | POA: Diagnosis not present

## 2019-10-03 DIAGNOSIS — E782 Mixed hyperlipidemia: Secondary | ICD-10-CM | POA: Diagnosis not present

## 2019-10-03 DIAGNOSIS — Z23 Encounter for immunization: Secondary | ICD-10-CM | POA: Diagnosis not present

## 2019-10-04 ENCOUNTER — Encounter: Payer: Self-pay | Admitting: Family Medicine

## 2019-10-04 DIAGNOSIS — E782 Mixed hyperlipidemia: Secondary | ICD-10-CM | POA: Diagnosis not present

## 2019-10-05 DIAGNOSIS — E782 Mixed hyperlipidemia: Secondary | ICD-10-CM | POA: Diagnosis not present

## 2019-10-06 DIAGNOSIS — E782 Mixed hyperlipidemia: Secondary | ICD-10-CM | POA: Diagnosis not present

## 2019-10-07 DIAGNOSIS — E782 Mixed hyperlipidemia: Secondary | ICD-10-CM | POA: Diagnosis not present

## 2019-10-08 DIAGNOSIS — E782 Mixed hyperlipidemia: Secondary | ICD-10-CM | POA: Diagnosis not present

## 2019-10-09 DIAGNOSIS — E782 Mixed hyperlipidemia: Secondary | ICD-10-CM | POA: Diagnosis not present

## 2019-10-10 DIAGNOSIS — E782 Mixed hyperlipidemia: Secondary | ICD-10-CM | POA: Diagnosis not present

## 2019-10-11 DIAGNOSIS — E782 Mixed hyperlipidemia: Secondary | ICD-10-CM | POA: Diagnosis not present

## 2019-10-12 DIAGNOSIS — E782 Mixed hyperlipidemia: Secondary | ICD-10-CM | POA: Diagnosis not present

## 2019-10-13 DIAGNOSIS — E782 Mixed hyperlipidemia: Secondary | ICD-10-CM | POA: Diagnosis not present

## 2019-10-14 DIAGNOSIS — E782 Mixed hyperlipidemia: Secondary | ICD-10-CM | POA: Diagnosis not present

## 2019-10-15 DIAGNOSIS — E782 Mixed hyperlipidemia: Secondary | ICD-10-CM | POA: Diagnosis not present

## 2019-10-16 DIAGNOSIS — E782 Mixed hyperlipidemia: Secondary | ICD-10-CM | POA: Diagnosis not present

## 2019-10-17 DIAGNOSIS — E782 Mixed hyperlipidemia: Secondary | ICD-10-CM | POA: Diagnosis not present

## 2019-10-18 DIAGNOSIS — E782 Mixed hyperlipidemia: Secondary | ICD-10-CM | POA: Diagnosis not present

## 2019-10-19 DIAGNOSIS — E782 Mixed hyperlipidemia: Secondary | ICD-10-CM | POA: Diagnosis not present

## 2019-10-20 DIAGNOSIS — E782 Mixed hyperlipidemia: Secondary | ICD-10-CM | POA: Diagnosis not present

## 2019-10-21 ENCOUNTER — Other Ambulatory Visit: Payer: Self-pay | Admitting: Registered Nurse

## 2019-10-21 ENCOUNTER — Ambulatory Visit: Payer: Medicaid Other

## 2019-10-21 ENCOUNTER — Other Ambulatory Visit: Payer: Self-pay | Admitting: *Deleted

## 2019-10-21 DIAGNOSIS — R21 Rash and other nonspecific skin eruption: Secondary | ICD-10-CM

## 2019-10-21 DIAGNOSIS — E782 Mixed hyperlipidemia: Secondary | ICD-10-CM | POA: Diagnosis not present

## 2019-10-21 MED ORDER — HYDROXYZINE HCL 25 MG PO TABS: 25.0000 mg | ORAL_TABLET | Freq: Three times a day (TID) | ORAL | 0 refills | Status: DC | PRN

## 2019-10-22 ENCOUNTER — Encounter: Payer: Self-pay | Admitting: *Deleted

## 2019-10-22 ENCOUNTER — Telehealth: Payer: Self-pay | Admitting: *Deleted

## 2019-10-22 ENCOUNTER — Encounter: Payer: Medicaid Other | Admitting: Physical Medicine and Rehabilitation

## 2019-10-22 DIAGNOSIS — E782 Mixed hyperlipidemia: Secondary | ICD-10-CM | POA: Diagnosis not present

## 2019-10-22 NOTE — Telephone Encounter (Signed)
error 

## 2019-10-22 NOTE — Telephone Encounter (Signed)
Refill request from pharmacy for Tramadol was rejected because her drug screen on 08/26/19 showed positive for Cocaine and Benzoylecgonine which is metabolite of cocaine.  She will be non narcotic treatment only.

## 2019-10-22 NOTE — Telephone Encounter (Signed)
Phone call placed to Rhonda Dominguez and letter mailed informing on non narcotic treatment only at [facility]. Documentation and letter is attached to refill request we received from pharmacy for Tramadol which has been denied based on drug screen results.

## 2019-10-23 DIAGNOSIS — E782 Mixed hyperlipidemia: Secondary | ICD-10-CM | POA: Diagnosis not present

## 2019-10-24 DIAGNOSIS — E782 Mixed hyperlipidemia: Secondary | ICD-10-CM | POA: Diagnosis not present

## 2019-10-25 ENCOUNTER — Ambulatory Visit: Payer: Medicaid Other

## 2019-10-25 DIAGNOSIS — E782 Mixed hyperlipidemia: Secondary | ICD-10-CM | POA: Diagnosis not present

## 2019-10-26 DIAGNOSIS — E782 Mixed hyperlipidemia: Secondary | ICD-10-CM | POA: Diagnosis not present

## 2019-10-27 DIAGNOSIS — E782 Mixed hyperlipidemia: Secondary | ICD-10-CM | POA: Diagnosis not present

## 2019-10-28 DIAGNOSIS — E782 Mixed hyperlipidemia: Secondary | ICD-10-CM | POA: Diagnosis not present

## 2019-10-29 DIAGNOSIS — E782 Mixed hyperlipidemia: Secondary | ICD-10-CM | POA: Diagnosis not present

## 2019-10-30 DIAGNOSIS — E782 Mixed hyperlipidemia: Secondary | ICD-10-CM | POA: Diagnosis not present

## 2019-10-31 DIAGNOSIS — E782 Mixed hyperlipidemia: Secondary | ICD-10-CM | POA: Diagnosis not present

## 2019-11-01 ENCOUNTER — Telehealth: Payer: Self-pay | Admitting: Pharmacist

## 2019-11-01 ENCOUNTER — Encounter: Payer: Self-pay | Admitting: Pharmacist

## 2019-11-01 DIAGNOSIS — E782 Mixed hyperlipidemia: Secondary | ICD-10-CM | POA: Diagnosis not present

## 2019-11-01 NOTE — Telephone Encounter (Addendum)
Follow-Up tobacco cessation.  States she has quit for more than 6 months.  She was congratulated for her success with quitting and encouraged to continue her efforts to remain smoke-free.   She verbalized that she "WILL NOT TAKE ANY VACCINE for Covid".  I did not debate this thought with her as the vaccine is not yet available.     She was in good spirits and without request.  She expressed thanks for the call and encouraged [place] to stay safe.    Adjusted problem list to "history of tobacco abuse" as she has quit > 81months!!!

## 2019-11-02 DIAGNOSIS — E782 Mixed hyperlipidemia: Secondary | ICD-10-CM | POA: Diagnosis not present

## 2019-11-03 ENCOUNTER — Telehealth: Payer: Self-pay

## 2019-11-03 DIAGNOSIS — E782 Mixed hyperlipidemia: Secondary | ICD-10-CM | POA: Diagnosis not present

## 2019-11-03 NOTE — Telephone Encounter (Signed)
Ptn upset about results of lab - states she does not do drugs.  Unhappy that SS was rude to her about results - no longer wants treatment in this clinic.

## 2019-11-04 DIAGNOSIS — E782 Mixed hyperlipidemia: Secondary | ICD-10-CM | POA: Diagnosis not present

## 2019-11-05 DIAGNOSIS — E782 Mixed hyperlipidemia: Secondary | ICD-10-CM | POA: Diagnosis not present

## 2019-11-06 DIAGNOSIS — E782 Mixed hyperlipidemia: Secondary | ICD-10-CM | POA: Diagnosis not present

## 2019-11-07 DIAGNOSIS — E782 Mixed hyperlipidemia: Secondary | ICD-10-CM | POA: Diagnosis not present

## 2019-11-08 DIAGNOSIS — E782 Mixed hyperlipidemia: Secondary | ICD-10-CM | POA: Diagnosis not present

## 2019-11-09 DIAGNOSIS — E782 Mixed hyperlipidemia: Secondary | ICD-10-CM | POA: Diagnosis not present

## 2019-11-10 DIAGNOSIS — E782 Mixed hyperlipidemia: Secondary | ICD-10-CM | POA: Diagnosis not present

## 2019-11-11 DIAGNOSIS — E782 Mixed hyperlipidemia: Secondary | ICD-10-CM | POA: Diagnosis not present

## 2019-11-12 ENCOUNTER — Ambulatory Visit: Payer: Medicaid Other | Admitting: Physical Medicine and Rehabilitation

## 2019-11-12 DIAGNOSIS — E782 Mixed hyperlipidemia: Secondary | ICD-10-CM | POA: Diagnosis not present

## 2019-11-13 DIAGNOSIS — E782 Mixed hyperlipidemia: Secondary | ICD-10-CM | POA: Diagnosis not present

## 2019-11-14 DIAGNOSIS — E782 Mixed hyperlipidemia: Secondary | ICD-10-CM | POA: Diagnosis not present

## 2019-11-15 DIAGNOSIS — E782 Mixed hyperlipidemia: Secondary | ICD-10-CM | POA: Diagnosis not present

## 2019-11-16 DIAGNOSIS — E782 Mixed hyperlipidemia: Secondary | ICD-10-CM | POA: Diagnosis not present

## 2019-11-17 DIAGNOSIS — E782 Mixed hyperlipidemia: Secondary | ICD-10-CM | POA: Diagnosis not present

## 2019-11-18 DIAGNOSIS — E782 Mixed hyperlipidemia: Secondary | ICD-10-CM | POA: Diagnosis not present

## 2019-11-19 DIAGNOSIS — E782 Mixed hyperlipidemia: Secondary | ICD-10-CM | POA: Diagnosis not present

## 2019-11-20 DIAGNOSIS — E782 Mixed hyperlipidemia: Secondary | ICD-10-CM | POA: Diagnosis not present

## 2019-11-21 DIAGNOSIS — E782 Mixed hyperlipidemia: Secondary | ICD-10-CM | POA: Diagnosis not present

## 2019-11-22 DIAGNOSIS — E782 Mixed hyperlipidemia: Secondary | ICD-10-CM | POA: Diagnosis not present

## 2019-11-23 DIAGNOSIS — E782 Mixed hyperlipidemia: Secondary | ICD-10-CM | POA: Diagnosis not present

## 2019-11-24 DIAGNOSIS — E782 Mixed hyperlipidemia: Secondary | ICD-10-CM | POA: Diagnosis not present

## 2019-11-25 DIAGNOSIS — E782 Mixed hyperlipidemia: Secondary | ICD-10-CM | POA: Diagnosis not present

## 2019-11-26 DIAGNOSIS — E782 Mixed hyperlipidemia: Secondary | ICD-10-CM | POA: Diagnosis not present

## 2019-11-27 DIAGNOSIS — E782 Mixed hyperlipidemia: Secondary | ICD-10-CM | POA: Diagnosis not present

## 2019-11-28 DIAGNOSIS — E782 Mixed hyperlipidemia: Secondary | ICD-10-CM | POA: Diagnosis not present

## 2019-11-29 DIAGNOSIS — E782 Mixed hyperlipidemia: Secondary | ICD-10-CM | POA: Diagnosis not present

## 2019-11-30 DIAGNOSIS — E782 Mixed hyperlipidemia: Secondary | ICD-10-CM | POA: Diagnosis not present

## 2019-12-01 ENCOUNTER — Ambulatory Visit: Payer: Medicaid Other | Admitting: Family Medicine

## 2019-12-01 DIAGNOSIS — E782 Mixed hyperlipidemia: Secondary | ICD-10-CM | POA: Diagnosis not present

## 2019-12-02 DIAGNOSIS — E782 Mixed hyperlipidemia: Secondary | ICD-10-CM | POA: Diagnosis not present

## 2019-12-03 DIAGNOSIS — E782 Mixed hyperlipidemia: Secondary | ICD-10-CM | POA: Diagnosis not present

## 2019-12-04 DIAGNOSIS — E782 Mixed hyperlipidemia: Secondary | ICD-10-CM | POA: Diagnosis not present

## 2019-12-05 DIAGNOSIS — E782 Mixed hyperlipidemia: Secondary | ICD-10-CM | POA: Diagnosis not present

## 2019-12-06 ENCOUNTER — Other Ambulatory Visit: Payer: Self-pay

## 2019-12-06 DIAGNOSIS — I10 Essential (primary) hypertension: Secondary | ICD-10-CM

## 2019-12-06 DIAGNOSIS — E782 Mixed hyperlipidemia: Secondary | ICD-10-CM | POA: Diagnosis not present

## 2019-12-06 NOTE — Telephone Encounter (Signed)
Patients calls nurse line requesting a refill on Trelegy and Enbrel. These medications are not on patients active medication list. Please advise.

## 2019-12-07 ENCOUNTER — Other Ambulatory Visit: Payer: Self-pay | Admitting: *Deleted

## 2019-12-07 DIAGNOSIS — R21 Rash and other nonspecific skin eruption: Secondary | ICD-10-CM

## 2019-12-07 DIAGNOSIS — J449 Chronic obstructive pulmonary disease, unspecified: Secondary | ICD-10-CM

## 2019-12-07 DIAGNOSIS — E782 Mixed hyperlipidemia: Secondary | ICD-10-CM | POA: Diagnosis not present

## 2019-12-07 NOTE — Telephone Encounter (Signed)
Pt calls demanding to speak with a supervisor for several reasons:  1. She would like a refill of hydroxyzine (refill attached to this note)  2. She would like a script for trelegy.  Explained that a PA would need to be done on trelegy, so I would have to wait to hear from Dr. Pilar Plate about Rx this  3. She is demanding a script for Enbrel cause she saw this on TV and was told by "Dr. Pilar Plate it is a good medicine" - Again advised that this was not something I could do without permission from Dr. Pilar Plate  4. Demands to speak with Dr. Pilar Plate.  Explained that was not possible at this instant because he was not physically here in the office.  Will forward to PCP to advise.    Of note, pts conversation was hard to follow including repetitive statements and demands.  At the end of a 15 minute conversation I think she understood the steps.  Christen Bame, CMA

## 2019-12-07 NOTE — Telephone Encounter (Signed)
Pt is calling back to check on her request for Trelegy and Enbrel. She said she is out of this and she is having asthma attcks and does not want to go to the hospital please call today. She is very concerned. jw

## 2019-12-08 ENCOUNTER — Telehealth: Payer: Self-pay | Admitting: *Deleted

## 2019-12-08 DIAGNOSIS — J449 Chronic obstructive pulmonary disease, unspecified: Secondary | ICD-10-CM | POA: Insufficient documentation

## 2019-12-08 DIAGNOSIS — E782 Mixed hyperlipidemia: Secondary | ICD-10-CM | POA: Diagnosis not present

## 2019-12-08 MED ORDER — HYDROXYZINE HCL 25 MG PO TABS: 25.0000 mg | ORAL_TABLET | Freq: Three times a day (TID) | ORAL | 0 refills | Status: DC | PRN

## 2019-12-08 MED ORDER — TRELEGY ELLIPTA 200-62.5-25 MCG/INH IN AEPB: 1.0000 | INHALATION_SPRAY | Freq: Every day | RESPIRATORY_TRACT | 12 refills | Status: DC

## 2019-12-08 MED ORDER — HYDROCHLOROTHIAZIDE 25 MG PO TABS: 25.0000 mg | ORAL_TABLET | Freq: Every day | ORAL | 3 refills | Status: DC

## 2019-12-08 NOTE — Telephone Encounter (Signed)
Is now calling to speak with "someone over my head".  Routed to preceptors as requested by Dr. Gwendlyn Deutscher.  Christen Bame, CMA

## 2019-12-08 NOTE — Telephone Encounter (Signed)
Hydroxycine has been refilled.   Attempted to call x 2.  No answer and mailbox full so cannot leave message.  I will call again later.

## 2019-12-08 NOTE — Addendum Note (Signed)
Addended by: Zenia Resides on: 12/08/2019 12:27 PM   Modules accepted: Orders

## 2019-12-08 NOTE — Telephone Encounter (Signed)
Called patient.  Hydroxycine and HCTZ have been refilled.  When I told her that enbrel does not work for all types of arthritis she said OK, I will forget about that one. Insisted that if I sent in Rx for her "pump" (trelegy), the pharmacist knows how to get it through medicaid.  I sent in a new rx even though it likely requires prior auth.

## 2019-12-08 NOTE — Telephone Encounter (Signed)
Pt calling back returning dr Andria Frames call. Latysha Thackston Kennon Holter, CMA

## 2019-12-08 NOTE — Telephone Encounter (Signed)
Completed PA info in [place] [facility] for Trelegy.  Status pending.  Will recheck status in 24 hours. Melo Stauber, CMA  

## 2019-12-09 DIAGNOSIS — E782 Mixed hyperlipidemia: Secondary | ICD-10-CM | POA: Diagnosis not present

## 2019-12-10 DIAGNOSIS — E782 Mixed hyperlipidemia: Secondary | ICD-10-CM | POA: Diagnosis not present

## 2019-12-11 DIAGNOSIS — E782 Mixed hyperlipidemia: Secondary | ICD-10-CM | POA: Diagnosis not present

## 2019-12-12 DIAGNOSIS — E782 Mixed hyperlipidemia: Secondary | ICD-10-CM | POA: Diagnosis not present

## 2019-12-13 DIAGNOSIS — E782 Mixed hyperlipidemia: Secondary | ICD-10-CM | POA: Diagnosis not present

## 2019-12-13 NOTE — Telephone Encounter (Signed)
Med approved for 12/08/2019 - 12/07/2020.  [facility] pharmacy informed via VM.  Christen Bame, CMA

## 2019-12-13 NOTE — Telephone Encounter (Signed)
Spoke with [name] this evening.  She reports subjectively that she is still having some breathing difficulty although she seemed to be breathing comfortably on room air during our conversation.  We did successfully have Trelegy approved for her.  She is informed of the trilogy approval.    She seemed to have further questions about a previous medication she had been on for arthritis but she could not remember what medication was called.  She was told that she has been prescribed Voltaren gel previously.  She did not want a refill Voltaren gel.  She said that she would figure out what it was for her upcoming appointment on 1/6.  She mentioned multiple times that she was hoping to have a "full work-up" during her appointment on 1/6.  She is concerned about her previous history of hepatitis for which she has been successfully treated.  She also has concerns about cerumen impaction in 1 of her ears which she would like addressed on 1/6.

## 2019-12-14 DIAGNOSIS — E782 Mixed hyperlipidemia: Secondary | ICD-10-CM | POA: Diagnosis not present

## 2019-12-15 ENCOUNTER — Ambulatory Visit (INDEPENDENT_AMBULATORY_CARE_PROVIDER_SITE_OTHER): Payer: Medicaid Other | Admitting: Family Medicine

## 2019-12-15 ENCOUNTER — Other Ambulatory Visit: Payer: Self-pay

## 2019-12-15 ENCOUNTER — Ambulatory Visit: Payer: Medicaid Other

## 2019-12-15 DIAGNOSIS — E782 Mixed hyperlipidemia: Secondary | ICD-10-CM | POA: Diagnosis not present

## 2019-12-15 DIAGNOSIS — R7309 Other abnormal glucose: Secondary | ICD-10-CM

## 2019-12-16 DIAGNOSIS — E782 Mixed hyperlipidemia: Secondary | ICD-10-CM | POA: Diagnosis not present

## 2019-12-17 DIAGNOSIS — E782 Mixed hyperlipidemia: Secondary | ICD-10-CM | POA: Diagnosis not present

## 2019-12-17 NOTE — Progress Notes (Signed)
Patient arrived early for her appt and left from lobby when informed we could not see her immediately.   -Dr. Criss Rosales

## 2019-12-18 DIAGNOSIS — E782 Mixed hyperlipidemia: Secondary | ICD-10-CM | POA: Diagnosis not present

## 2019-12-19 DIAGNOSIS — E782 Mixed hyperlipidemia: Secondary | ICD-10-CM | POA: Diagnosis not present

## 2019-12-20 ENCOUNTER — Telehealth: Payer: Self-pay | Admitting: Pharmacist

## 2019-12-20 DIAGNOSIS — Z87891 Personal history of nicotine dependence: Secondary | ICD-10-CM

## 2019-12-20 DIAGNOSIS — E782 Mixed hyperlipidemia: Secondary | ICD-10-CM | POA: Diagnosis not present

## 2019-12-20 NOTE — Assessment & Plan Note (Signed)
Phone Follow-up of tobacco cessation- 10 month quit  Patient is OFF all therapy and reports confidence in remaining smoke free.   She recently ran out of her inhaler "PUMP" and she reports her breathing worsened.  She has a new inhaler and states her breathing is improving.  She had no shortness of breath while talking on the phone.   I will plan to contact her in 6 weeks and plan to celebrate her 1 year quit from smoking in March 2021.

## 2019-12-20 NOTE — Telephone Encounter (Signed)
Noted and agree. 

## 2019-12-20 NOTE — Telephone Encounter (Signed)
Phone Follow-up of tobacco cessation- 10 month quit  Patient is OFF all therapy and reports confidence in remaining smoke free.   She recently ran out of her inhaler "PUMP" and she reports her breathing worsened.  She has a new inhaler and states her breathing is improving.  She had no shortness of breath while talking on the phone.   I will plan to contact her in 6 weeks and plan to celebrate her 1 year quit from smoking in March 2021.

## 2019-12-21 ENCOUNTER — Ambulatory Visit: Payer: Medicaid Other | Admitting: Family Medicine

## 2019-12-21 DIAGNOSIS — E782 Mixed hyperlipidemia: Secondary | ICD-10-CM | POA: Diagnosis not present

## 2019-12-22 DIAGNOSIS — E782 Mixed hyperlipidemia: Secondary | ICD-10-CM | POA: Diagnosis not present

## 2019-12-23 DIAGNOSIS — E782 Mixed hyperlipidemia: Secondary | ICD-10-CM | POA: Diagnosis not present

## 2019-12-24 DIAGNOSIS — E782 Mixed hyperlipidemia: Secondary | ICD-10-CM | POA: Diagnosis not present

## 2019-12-25 DIAGNOSIS — E782 Mixed hyperlipidemia: Secondary | ICD-10-CM | POA: Diagnosis not present

## 2019-12-26 DIAGNOSIS — E782 Mixed hyperlipidemia: Secondary | ICD-10-CM | POA: Diagnosis not present

## 2019-12-27 DIAGNOSIS — E782 Mixed hyperlipidemia: Secondary | ICD-10-CM | POA: Diagnosis not present

## 2019-12-28 ENCOUNTER — Other Ambulatory Visit: Payer: Self-pay

## 2019-12-28 ENCOUNTER — Ambulatory Visit (INDEPENDENT_AMBULATORY_CARE_PROVIDER_SITE_OTHER): Payer: Medicaid Other | Admitting: Family Medicine

## 2019-12-28 ENCOUNTER — Encounter: Payer: Self-pay | Admitting: Family Medicine

## 2019-12-28 VITALS — BP 122/76 | HR 83 | Ht 67.0 in | Wt 164.1 lb

## 2019-12-28 DIAGNOSIS — E782 Mixed hyperlipidemia: Secondary | ICD-10-CM | POA: Diagnosis not present

## 2019-12-28 DIAGNOSIS — M8949 Other hypertrophic osteoarthropathy, multiple sites: Secondary | ICD-10-CM

## 2019-12-28 DIAGNOSIS — Z113 Encounter for screening for infections with a predominantly sexual mode of transmission: Secondary | ICD-10-CM | POA: Diagnosis not present

## 2019-12-28 DIAGNOSIS — G90521 Complex regional pain syndrome I of right lower limb: Secondary | ICD-10-CM | POA: Diagnosis not present

## 2019-12-28 DIAGNOSIS — I10 Essential (primary) hypertension: Secondary | ICD-10-CM | POA: Diagnosis not present

## 2019-12-28 DIAGNOSIS — R7303 Prediabetes: Secondary | ICD-10-CM

## 2019-12-28 DIAGNOSIS — M159 Polyosteoarthritis, unspecified: Secondary | ICD-10-CM

## 2019-12-28 LAB — POCT GLYCOSYLATED HEMOGLOBIN (HGB A1C): HbA1c, POC (controlled diabetic range): 5.9 % (ref 0.0–7.0)

## 2019-12-28 MED ORDER — DICLOFENAC SODIUM 1 % EX GEL: 2.0000 g | Freq: Four times a day (QID) | CUTANEOUS | 1 refills | Status: DC

## 2019-12-28 MED ORDER — MELOXICAM 7.5 MG PO TABS: 7.5000 mg | ORAL_TABLET | Freq: Two times a day (BID) | ORAL | 0 refills | Status: DC | PRN

## 2019-12-28 MED ORDER — GABAPENTIN 300 MG PO CAPS: 300.0000 mg | ORAL_CAPSULE | Freq: Three times a day (TID) | ORAL | 1 refills | Status: DC

## 2019-12-28 MED ORDER — TRIAMCINOLONE ACETONIDE 0.1 % EX CREA: TOPICAL_CREAM | Freq: Four times a day (QID) | CUTANEOUS | 2 refills | Status: DC | PRN

## 2019-12-28 MED ORDER — NORTRIPTYLINE HCL 25 MG PO CAPS: 25.0000 mg | ORAL_CAPSULE | Freq: Every day | ORAL | 2 refills | Status: DC

## 2019-12-28 NOTE — Patient Instructions (Signed)
Is a quick summary of the things we talked about:  Prediabetes: You are doing a very good job with her diet and exercise.  Keep it up.  Leg pain: Looks like this leg pain and numbness has been bothering her a lot.  Off continue to review chart and keep an eye out for that next visit you are having.  Gabapentin and nortriptyline will also be helpful for this kind of leg pain.  Osteoarthritis of the knees: You can take Mobic daily (do not take Motrin if you take Mobic).  You can also put on topical Voltaren gel over your knees which will help with arthritis pain.  Blood pressure: Your blood pressure looks really good today.  The continue with the same regimen.

## 2019-12-28 NOTE — Progress Notes (Signed)
Subjective:  Rhonda Dominguez is a 59 y.o. female who presents to the [facility] today for follow-up regarding prediabetes and leg pain.   HPI:  Prediabetes She is not currently taking any medication.  She reports that she has been working diligently on her diet and been exercising regularly.  Neuropathic leg pain She reports that she has been having some numbness and tingling in her right leg for roughly the past 6 months.  She reports that it starts in her lower back and continues down to her right foot.  She is currently taking gabapentin and nortriptyline which does not seem to provide too much relief.  She reports that she is going to a neurologist for some kind of free assessment in the next week and she would like me to follow-up with the notes from that visit.  She reported that she did not want me to do an assessment of her leg today.  Knee osteoarthritis of the knee She continues to have mild discomfort primarily in her right knee.  She notes similar pain in other joints as well but her right knee is worse.  She currently treats this with Voltaren gel and Mobic.  She feels that she can participate normally in her daily activities and is not withheld from activities due to her knee pain.  She feels comfortable with her treatment at this time.  Screening for STI She is concerned because she found out that a relation of her recent partner was HIV positive and she would like to be tested for HIV.  She is not experiencing any symptoms at this time.  Chief Complaint noted Review of Symptoms - see HPI PMH - Smoking status noted, no cigarettes in the past 3 months  Objective:  Physical Exam: BP 122/76   Pulse 83   Ht 5\' 7"  (1.702 m)   Wt 164 lb 2 oz (74.4 kg)   SpO2 98%   BMI 25.71 kg/m    Gen: NAD, resting comfortably in the chair in the exam room.  Able to stand up and walk around the exam room during our conversation. CV: RRR with no murmurs appreciated.  Strong radial  pulse. Pulm: NWOB, CTAB with no crackles, wheezes, or rhonchi   Results for orders placed or performed in visit on 12/28/19 (from the past 72 hour(s))  HgB A1c     Status: None   Collection Time: 12/28/19  8:24 AM  Result Value Ref Range   Hemoglobin A1C     HbA1c POC (<> result, manual entry)     HbA1c, POC (prediabetic range)     HbA1c, POC (controlled diabetic range) 5.9 0.0 - 7.0 %     Assessment/Plan:  Osteoarthritis Well-controlled for now.  Continue current management.  Discussed natural progression of osteoarthritis. -Voltaren gel refilled -Mobic refilled -She is encouraged not to use Motrin and Mobic on the same day  Essential hypertension Well-controlled.  Continue current management. -Continue amlodipine 10 mg daily -Continue HCTZ 25 mg daily -No blood work needed at this time  Routine screening for STI (sexually transmitted infection) -Follow-up HIV, RPR  Complex regional pain syndrome type 1 of lower extremity (Right) No significant changes in the past several months.  She has plan to follow-up with a neurologist in the next week.  She declined an exam today. -Continue gabapentin -Continue nortriptyline -Follow-up neurology recommendations  Prediabetes A1c 5.9 today.  Currently well controlled with diet and exercise. -Continue to manage with diet and exercise -Follow-up in  3 months

## 2019-12-28 NOTE — Assessment & Plan Note (Signed)
-  Follow-up HIV, RPR

## 2019-12-28 NOTE — Assessment & Plan Note (Signed)
No significant changes in the past several months.  She has plan to follow-up with a neurologist in the next week.  She declined an exam today. -Continue gabapentin -Continue nortriptyline -Follow-up neurology recommendations

## 2019-12-28 NOTE — Assessment & Plan Note (Signed)
Well-controlled for now.  Continue current management.  Discussed natural progression of osteoarthritis. -Voltaren gel refilled -Mobic refilled -She is encouraged not to use Motrin and Mobic on the same day

## 2019-12-28 NOTE — Assessment & Plan Note (Signed)
A1c 5.9 today.  Currently well controlled with diet and exercise. -Continue to manage with diet and exercise -Follow-up in 3 months

## 2019-12-28 NOTE — Assessment & Plan Note (Addendum)
Well-controlled.  Continue current management. -Continue amlodipine 10 mg daily -Continue HCTZ 25 mg daily -No blood work needed at this time

## 2019-12-29 ENCOUNTER — Encounter: Payer: Self-pay | Admitting: Family Medicine

## 2019-12-29 DIAGNOSIS — E782 Mixed hyperlipidemia: Secondary | ICD-10-CM | POA: Diagnosis not present

## 2019-12-29 LAB — RPR: RPR Ser Ql: NONREACTIVE

## 2019-12-29 LAB — HIV ANTIBODY (ROUTINE TESTING W REFLEX): HIV Screen 4th Generation wRfx: NONREACTIVE

## 2019-12-30 DIAGNOSIS — E782 Mixed hyperlipidemia: Secondary | ICD-10-CM | POA: Diagnosis not present

## 2019-12-31 DIAGNOSIS — E782 Mixed hyperlipidemia: Secondary | ICD-10-CM | POA: Diagnosis not present

## 2020-01-01 DIAGNOSIS — E782 Mixed hyperlipidemia: Secondary | ICD-10-CM | POA: Diagnosis not present

## 2020-01-02 DIAGNOSIS — E782 Mixed hyperlipidemia: Secondary | ICD-10-CM | POA: Diagnosis not present

## 2020-01-03 DIAGNOSIS — E782 Mixed hyperlipidemia: Secondary | ICD-10-CM | POA: Diagnosis not present

## 2020-01-04 DIAGNOSIS — E782 Mixed hyperlipidemia: Secondary | ICD-10-CM | POA: Diagnosis not present

## 2020-01-05 DIAGNOSIS — E782 Mixed hyperlipidemia: Secondary | ICD-10-CM | POA: Diagnosis not present

## 2020-01-06 DIAGNOSIS — E782 Mixed hyperlipidemia: Secondary | ICD-10-CM | POA: Diagnosis not present

## 2020-01-07 DIAGNOSIS — E782 Mixed hyperlipidemia: Secondary | ICD-10-CM | POA: Diagnosis not present

## 2020-01-08 DIAGNOSIS — E782 Mixed hyperlipidemia: Secondary | ICD-10-CM | POA: Diagnosis not present

## 2020-01-09 DIAGNOSIS — E782 Mixed hyperlipidemia: Secondary | ICD-10-CM | POA: Diagnosis not present

## 2020-01-10 DIAGNOSIS — E782 Mixed hyperlipidemia: Secondary | ICD-10-CM | POA: Diagnosis not present

## 2020-01-11 DIAGNOSIS — E782 Mixed hyperlipidemia: Secondary | ICD-10-CM | POA: Diagnosis not present

## 2020-01-12 DIAGNOSIS — E782 Mixed hyperlipidemia: Secondary | ICD-10-CM | POA: Diagnosis not present

## 2020-01-13 DIAGNOSIS — E782 Mixed hyperlipidemia: Secondary | ICD-10-CM | POA: Diagnosis not present

## 2020-01-14 DIAGNOSIS — E782 Mixed hyperlipidemia: Secondary | ICD-10-CM | POA: Diagnosis not present

## 2020-01-15 DIAGNOSIS — E782 Mixed hyperlipidemia: Secondary | ICD-10-CM | POA: Diagnosis not present

## 2020-01-16 DIAGNOSIS — E782 Mixed hyperlipidemia: Secondary | ICD-10-CM | POA: Diagnosis not present

## 2020-01-17 DIAGNOSIS — E782 Mixed hyperlipidemia: Secondary | ICD-10-CM | POA: Diagnosis not present

## 2020-01-18 DIAGNOSIS — E782 Mixed hyperlipidemia: Secondary | ICD-10-CM | POA: Diagnosis not present

## 2020-01-19 DIAGNOSIS — E782 Mixed hyperlipidemia: Secondary | ICD-10-CM | POA: Diagnosis not present

## 2020-01-20 DIAGNOSIS — E782 Mixed hyperlipidemia: Secondary | ICD-10-CM | POA: Diagnosis not present

## 2020-01-21 DIAGNOSIS — E782 Mixed hyperlipidemia: Secondary | ICD-10-CM | POA: Diagnosis not present

## 2020-01-22 DIAGNOSIS — E782 Mixed hyperlipidemia: Secondary | ICD-10-CM | POA: Diagnosis not present

## 2020-01-23 DIAGNOSIS — E782 Mixed hyperlipidemia: Secondary | ICD-10-CM | POA: Diagnosis not present

## 2020-01-24 DIAGNOSIS — E782 Mixed hyperlipidemia: Secondary | ICD-10-CM | POA: Diagnosis not present

## 2020-01-25 ENCOUNTER — Telehealth: Payer: Self-pay | Admitting: Pharmacist

## 2020-01-25 DIAGNOSIS — E782 Mixed hyperlipidemia: Secondary | ICD-10-CM | POA: Diagnosis not present

## 2020-01-25 NOTE — Telephone Encounter (Signed)
Patient contacted in follow-up of 11 month quit of smoking cigarettes.  Denies any smoking.  "I am doing great".  She reported breathing was stable.   She also reported she has been out of power for several days.  She expressed concern about her power and heat being out.   Following discussion she appeared to be less scared about situation and believed she would have power restored soon.   Plan f/u in 1 month for 1 year quit mark.

## 2020-01-25 NOTE — Telephone Encounter (Signed)
-----   Message from Leavy Cella, [facility] sent at 12/20/2019 11:36 AM EST ----- Regarding: Tobacco Cessaton - 11 months

## 2020-01-26 ENCOUNTER — Other Ambulatory Visit: Payer: Self-pay

## 2020-01-26 ENCOUNTER — Ambulatory Visit
Admission: RE | Admit: 2020-01-26 | Discharge: 2020-01-26 | Disposition: A | Discharge status: Home / Self Care | Payer: Medicaid Other | Source: Ambulatory Visit | Attending: Family Medicine | Admitting: Family Medicine

## 2020-01-26 ENCOUNTER — Other Ambulatory Visit: Payer: Self-pay | Admitting: Family Medicine

## 2020-01-26 DIAGNOSIS — R21 Rash and other nonspecific skin eruption: Secondary | ICD-10-CM

## 2020-01-26 DIAGNOSIS — E782 Mixed hyperlipidemia: Secondary | ICD-10-CM | POA: Diagnosis not present

## 2020-01-26 DIAGNOSIS — Z1239 Encounter for other screening for malignant neoplasm of breast: Secondary | ICD-10-CM

## 2020-01-26 DIAGNOSIS — Z23 Encounter for immunization: Secondary | ICD-10-CM | POA: Diagnosis not present

## 2020-01-26 DIAGNOSIS — Z1231 Encounter for screening mammogram for malignant neoplasm of breast: Secondary | ICD-10-CM | POA: Diagnosis not present

## 2020-01-26 MED ORDER — HYDROXYZINE HCL 25 MG PO TABS: 25.0000 mg | ORAL_TABLET | Freq: Three times a day (TID) | ORAL | 0 refills | Status: DC | PRN

## 2020-01-27 DIAGNOSIS — E782 Mixed hyperlipidemia: Secondary | ICD-10-CM | POA: Diagnosis not present

## 2020-01-28 DIAGNOSIS — E782 Mixed hyperlipidemia: Secondary | ICD-10-CM | POA: Diagnosis not present

## 2020-01-29 DIAGNOSIS — E782 Mixed hyperlipidemia: Secondary | ICD-10-CM | POA: Diagnosis not present

## 2020-01-30 DIAGNOSIS — E782 Mixed hyperlipidemia: Secondary | ICD-10-CM | POA: Diagnosis not present

## 2020-01-31 DIAGNOSIS — E782 Mixed hyperlipidemia: Secondary | ICD-10-CM | POA: Diagnosis not present

## 2020-02-01 DIAGNOSIS — E782 Mixed hyperlipidemia: Secondary | ICD-10-CM | POA: Diagnosis not present

## 2020-02-02 DIAGNOSIS — E782 Mixed hyperlipidemia: Secondary | ICD-10-CM | POA: Diagnosis not present

## 2020-02-03 DIAGNOSIS — E782 Mixed hyperlipidemia: Secondary | ICD-10-CM | POA: Diagnosis not present

## 2020-02-04 DIAGNOSIS — E782 Mixed hyperlipidemia: Secondary | ICD-10-CM | POA: Diagnosis not present

## 2020-02-05 DIAGNOSIS — E782 Mixed hyperlipidemia: Secondary | ICD-10-CM | POA: Diagnosis not present

## 2020-02-06 DIAGNOSIS — E782 Mixed hyperlipidemia: Secondary | ICD-10-CM | POA: Diagnosis not present

## 2020-02-07 DIAGNOSIS — E782 Mixed hyperlipidemia: Secondary | ICD-10-CM | POA: Diagnosis not present

## 2020-02-08 DIAGNOSIS — E782 Mixed hyperlipidemia: Secondary | ICD-10-CM | POA: Diagnosis not present

## 2020-02-09 DIAGNOSIS — E782 Mixed hyperlipidemia: Secondary | ICD-10-CM | POA: Diagnosis not present

## 2020-02-10 ENCOUNTER — Telehealth: Payer: Self-pay | Admitting: Family Medicine

## 2020-02-10 DIAGNOSIS — E782 Mixed hyperlipidemia: Secondary | ICD-10-CM | POA: Diagnosis not present

## 2020-02-10 NOTE — Telephone Encounter (Signed)
Patient calling again about needing a pill to help with her bowel movements.

## 2020-02-10 NOTE — Telephone Encounter (Signed)
Patient wanted to let Dr. Pilar Plate and Valentina Lucks know she got her first Covid vaccine.

## 2020-02-10 NOTE — Telephone Encounter (Signed)
Will check with Md to see if he can send in something for constipation.  Baya Lentz,CMA

## 2020-02-10 NOTE — Telephone Encounter (Signed)
Pt. Called requesting doctor to call her at [phone]. She states her bowels are locked and she has been throwing up / feeling miserable.   Can he call her in a prescription to unlock her bowels.   [facility] Team

## 2020-02-11 ENCOUNTER — Ambulatory Visit (INDEPENDENT_AMBULATORY_CARE_PROVIDER_SITE_OTHER): Payer: Medicaid Other | Admitting: Family Medicine

## 2020-02-11 ENCOUNTER — Other Ambulatory Visit: Payer: Self-pay

## 2020-02-11 VITALS — BP 130/80 | HR 106 | Wt 168.4 lb

## 2020-02-11 DIAGNOSIS — E782 Mixed hyperlipidemia: Secondary | ICD-10-CM | POA: Diagnosis not present

## 2020-02-11 DIAGNOSIS — K5900 Constipation, unspecified: Secondary | ICD-10-CM | POA: Diagnosis not present

## 2020-02-11 MED ORDER — POLYETHYLENE GLYCOL 3350 17 G PO PACK: 17.0000 g | PACK | Freq: Every day | ORAL | 11 refills | Status: DC

## 2020-02-11 NOTE — Telephone Encounter (Signed)
Patient calls nurse line hyper in regards to yesterdays request. I advised patient she will need to be seen to make sure she does not have an obstruction. After much back and forth, patient agreed. Patient coming in this afternoon.

## 2020-02-11 NOTE — Telephone Encounter (Signed)
If this patient is having vomiting from an intestinal blockage it is probably best if she is seen by a physician, either here or at the ED.  I cannot distinguish over the phone whether she is unable to have a bowel movement due to constipation or a small bowel obstruction or ileus.  Therefore I can't give her any laxative/treatment that would be contraindicated in those conditions.  Can you call her to let her know this?

## 2020-02-11 NOTE — Progress Notes (Signed)
   Subjective:   Patient ID: Rhonda Dominguez    DOB: [birth year], 59 y.o. female   MRN: [id number]  Rhonda Dominguez is a 59 y.o. female with a history of essential hypertension, restrictive lung disease, chronic back pain, osteoarthritis, bipolar affective disorder, constipation, prediabetes, here for constipation.  Constipation: Patient presenting today with 3 days history of constipation.  Endorsed initially had nausea and vomiting on the first day but that has since improved.  She is now tolerating p.o. without difficulty.  She notes she had a bowel movement this morning however notes it was hard stool that was painful to pass.  She has chronic constipation, but does not take anything daily for it.  Note she was given a prescription medicine in the past that worked.  She is unsure of the name.  Denies any abdominal pain today.  Denies any fever, chills.  Notes she drinks about 8 bottles (12-16oz bottles) of water a day.  Review of Systems:  Per HPI.   [place], medications and smoking status reviewed.  Objective:   BP 130/80   Pulse (!) 106   Wt 168 lb 6.4 oz (76.4 kg)   SpO2 95%   BMI 26.38 kg/m  Vitals and nursing note reviewed.  General: Pleasant older female, walking around exam room without difficulty, well nourished, well developed, in no acute distress with non-toxic appearance Resp: Speaking in full sentences, breathing comfortably on room air Abdomen: firm to palpation, non-tender, non-distended, no masses or organomegaly palpable, normoactive bowel sounds on auscultation Skin: warm, dry MSK: gait normal Neuro: Alert and oriented, speech normal  Assessment & Plan:   Constipation Acute on chronic.  Time for evaluation was very limited during exam as patient had to leave soon after arriving to go to another appointment.  Patient very well-appearing walking around room without difficulty.  Had bowel movement this morning with hard stool, no further nausea or vomiting, and  tolerating p.o.  Unclear of prior medication she was on.  Recommended OTC MiraLAX twice daily until soft stools, then daily.  Recommended increasing daily fiber intake as well and ensuring adequate water intake.  Follow-up if no improvement.  Meds ordered this encounter  Medications  . polyethylene glycol (MIRALAX / GLYCOLAX) 17 g packet    Sig: Take 17 g by mouth daily.    Dispense:  30 each    Refill:  Anthony, DO PGY-2, [facility] 02/12/2020 9:40 PM

## 2020-02-12 ENCOUNTER — Encounter: Payer: Self-pay | Admitting: Family Medicine

## 2020-02-12 DIAGNOSIS — E782 Mixed hyperlipidemia: Secondary | ICD-10-CM | POA: Diagnosis not present

## 2020-02-12 NOTE — Assessment & Plan Note (Signed)
Acute on chronic.  Time for evaluation was very limited during exam as patient had to leave soon after arriving to go to another appointment.  Patient very well-appearing walking around room without difficulty.  Had bowel movement this morning with hard stool, no further nausea or vomiting, and tolerating p.o.  Unclear of prior medication she was on.  Recommended OTC MiraLAX twice daily until soft stools, then daily.  Recommended increasing daily fiber intake as well and ensuring adequate water intake.  Follow-up if no improvement.

## 2020-02-13 DIAGNOSIS — E782 Mixed hyperlipidemia: Secondary | ICD-10-CM | POA: Diagnosis not present

## 2020-02-14 DIAGNOSIS — E782 Mixed hyperlipidemia: Secondary | ICD-10-CM | POA: Diagnosis not present

## 2020-02-14 NOTE — Telephone Encounter (Signed)
Fantastic news! Thank you for the update.

## 2020-02-15 ENCOUNTER — Telehealth: Payer: Self-pay | Admitting: Family Medicine

## 2020-02-15 DIAGNOSIS — E782 Mixed hyperlipidemia: Secondary | ICD-10-CM | POA: Diagnosis not present

## 2020-02-15 NOTE — Telephone Encounter (Signed)
Pt called and wants me to tell Dr. Pilar Plate and Dr. Valentina Lucks to call her. Wouldn't specify why. Please advise. Thanks

## 2020-02-15 NOTE — Telephone Encounter (Signed)
Will forward to MD. Jayana Kotula,CMA  

## 2020-02-16 DIAGNOSIS — E782 Mixed hyperlipidemia: Secondary | ICD-10-CM | POA: Diagnosis not present

## 2020-02-16 NOTE — Telephone Encounter (Signed)
Called pt. No answer. LMOVM.

## 2020-02-16 NOTE — Telephone Encounter (Signed)
Pt returned your call. She would like you to call again. Ottis Stain, CMA

## 2020-02-17 DIAGNOSIS — E782 Mixed hyperlipidemia: Secondary | ICD-10-CM | POA: Diagnosis not present

## 2020-02-18 DIAGNOSIS — E782 Mixed hyperlipidemia: Secondary | ICD-10-CM | POA: Diagnosis not present

## 2020-02-18 NOTE — Telephone Encounter (Signed)
Patient called.  No answer.  Left message on voicemail

## 2020-02-18 NOTE — Telephone Encounter (Signed)
Spoke with Ms. Kornblum on the phone.  She was excited to inform me that she received her first COVID vaccine without issue and is already signed up for her second shot.  She requested that this news be passed along to Dr. Valentina Lucks.

## 2020-02-19 DIAGNOSIS — E782 Mixed hyperlipidemia: Secondary | ICD-10-CM | POA: Diagnosis not present

## 2020-02-20 DIAGNOSIS — E782 Mixed hyperlipidemia: Secondary | ICD-10-CM | POA: Diagnosis not present

## 2020-02-21 DIAGNOSIS — E782 Mixed hyperlipidemia: Secondary | ICD-10-CM | POA: Diagnosis not present

## 2020-02-22 DIAGNOSIS — E782 Mixed hyperlipidemia: Secondary | ICD-10-CM | POA: Diagnosis not present

## 2020-02-23 DIAGNOSIS — E782 Mixed hyperlipidemia: Secondary | ICD-10-CM | POA: Diagnosis not present

## 2020-02-24 DIAGNOSIS — E782 Mixed hyperlipidemia: Secondary | ICD-10-CM | POA: Diagnosis not present

## 2020-02-25 DIAGNOSIS — E782 Mixed hyperlipidemia: Secondary | ICD-10-CM | POA: Diagnosis not present

## 2020-02-26 DIAGNOSIS — E782 Mixed hyperlipidemia: Secondary | ICD-10-CM | POA: Diagnosis not present

## 2020-02-27 ENCOUNTER — Other Ambulatory Visit: Payer: Self-pay | Admitting: Family Medicine

## 2020-02-27 DIAGNOSIS — E782 Mixed hyperlipidemia: Secondary | ICD-10-CM | POA: Diagnosis not present

## 2020-02-28 DIAGNOSIS — E782 Mixed hyperlipidemia: Secondary | ICD-10-CM | POA: Diagnosis not present

## 2020-02-29 DIAGNOSIS — E782 Mixed hyperlipidemia: Secondary | ICD-10-CM | POA: Diagnosis not present

## 2020-03-01 DIAGNOSIS — E782 Mixed hyperlipidemia: Secondary | ICD-10-CM | POA: Diagnosis not present

## 2020-03-02 DIAGNOSIS — E782 Mixed hyperlipidemia: Secondary | ICD-10-CM | POA: Diagnosis not present

## 2020-03-03 DIAGNOSIS — E782 Mixed hyperlipidemia: Secondary | ICD-10-CM | POA: Diagnosis not present

## 2020-03-04 DIAGNOSIS — E782 Mixed hyperlipidemia: Secondary | ICD-10-CM | POA: Diagnosis not present

## 2020-03-05 DIAGNOSIS — E782 Mixed hyperlipidemia: Secondary | ICD-10-CM | POA: Diagnosis not present

## 2020-03-06 DIAGNOSIS — E782 Mixed hyperlipidemia: Secondary | ICD-10-CM | POA: Diagnosis not present

## 2020-03-07 DIAGNOSIS — E782 Mixed hyperlipidemia: Secondary | ICD-10-CM | POA: Diagnosis not present

## 2020-03-08 DIAGNOSIS — E782 Mixed hyperlipidemia: Secondary | ICD-10-CM | POA: Diagnosis not present

## 2020-03-09 DIAGNOSIS — E782 Mixed hyperlipidemia: Secondary | ICD-10-CM | POA: Diagnosis not present

## 2020-03-10 DIAGNOSIS — E782 Mixed hyperlipidemia: Secondary | ICD-10-CM | POA: Diagnosis not present

## 2020-03-11 DIAGNOSIS — E782 Mixed hyperlipidemia: Secondary | ICD-10-CM | POA: Diagnosis not present

## 2020-03-12 DIAGNOSIS — E782 Mixed hyperlipidemia: Secondary | ICD-10-CM | POA: Diagnosis not present

## 2020-03-13 DIAGNOSIS — E782 Mixed hyperlipidemia: Secondary | ICD-10-CM | POA: Diagnosis not present

## 2020-03-13 NOTE — Progress Notes (Signed)
Entered in error

## 2020-03-14 ENCOUNTER — Other Ambulatory Visit: Payer: Self-pay

## 2020-03-14 ENCOUNTER — Ambulatory Visit (INDEPENDENT_AMBULATORY_CARE_PROVIDER_SITE_OTHER): Payer: Medicaid Other | Admitting: Family Medicine

## 2020-03-14 ENCOUNTER — Encounter: Payer: Self-pay | Admitting: Family Medicine

## 2020-03-14 VITALS — BP 126/74 | HR 78 | Ht 67.0 in | Wt 169.8 lb

## 2020-03-14 DIAGNOSIS — R21 Rash and other nonspecific skin eruption: Secondary | ICD-10-CM | POA: Diagnosis not present

## 2020-03-14 DIAGNOSIS — G90521 Complex regional pain syndrome I of right lower limb: Secondary | ICD-10-CM

## 2020-03-14 DIAGNOSIS — L981 Factitial dermatitis: Secondary | ICD-10-CM

## 2020-03-14 DIAGNOSIS — R7303 Prediabetes: Secondary | ICD-10-CM

## 2020-03-14 DIAGNOSIS — E782 Mixed hyperlipidemia: Secondary | ICD-10-CM | POA: Diagnosis not present

## 2020-03-14 LAB — POCT GLYCOSYLATED HEMOGLOBIN (HGB A1C): HbA1c, POC (controlled diabetic range): 6 % (ref 0.0–7.0)

## 2020-03-14 MED ORDER — TRIAMCINOLONE ACETONIDE 0.1 % EX CREA: TOPICAL_CREAM | CUTANEOUS | 2 refills | Status: DC

## 2020-03-14 MED ORDER — HYDROXYZINE HCL 25 MG PO TABS: 25.0000 mg | ORAL_TABLET | Freq: Three times a day (TID) | ORAL | 0 refills | Status: DC | PRN

## 2020-03-14 NOTE — Assessment & Plan Note (Signed)
A1c at 6.0 today. No new treatment today

## 2020-03-14 NOTE — Progress Notes (Signed)
    SUBJECTIVE:   CHIEF COMPLAINT / HPI:   Excoriations - She presents with over 1 year history of hyperpigmented lesions on her skin over all 4 extremities. Patient states that these spots have become increasingly pruritic over the past month and she is now scratching leading to ulceration and scarring. She denies any exposure to new detergent, lotion, or body wash. She was previously using dollar tree detergent and switched to tide, which she states makes her clothing feel less scratchy but has not improved her skin lesions at all. Patient is not happy with prior prescriptions, stating that nothing has improved the lesions of pruritis. Additionally she has tried neosporin and diclofenac sodium cream, neither of which has improved the lesions or pruritis. She does not endorse the sensation of bugs crawling on her skin.  Dry skin on heel - patient wanted tips on how to remove dry skin on her heel   PERTINENT  PMH / PSH: neurotic excoriations, bipolar affective disorder, chronic pain syndrome   OBJECTIVE:   BP 126/74   Pulse 78   Ht 5\' 7"  (1.702 m)   Wt 169 lb 12.8 oz (77 kg)   SpO2 97%   BMI 26.59 kg/m   Physical Exam  Constitutional: She is well-developed, well-nourished, and in no distress.  Cardiovascular: Normal rate, regular rhythm and normal heart sounds.  Pulmonary/Chest: Effort normal and breath sounds normal. She has no wheezes.  Neurological: She is alert.  Skin: Skin is warm and dry. Rash noted. She is not diaphoretic. No erythema.     ASSESSMENT/PLAN:   Neurotic excoriations Trying Gabapentin 300mg  and Triamcinolone 0.1% cream today to help with the pruritis. Let her know that if Gabapentin is not working she can take as many as 3 tablets a day. She can also try taking the hydroxyzine tablets. Let her know that this can make her feel sleepy  Prediabetes A1c at 6.0 today. No new treatment today  Dry skin on heel - recommended getting a pumice stone to scrub at home     [place]

## 2020-03-14 NOTE — Patient Instructions (Signed)
It was so nice to see you today Ms. Bellisario.  Here is a quick review of the things we talked about:  Neurotic excoriations: I think gabapentin and triamcinolone cream are the best medications for you.  Failed gabapentin, you can take as many as 3 pills/day at breakfast, lunch and dinner.  I think this will probably be the most helpful thing for you.  I have also refilled your triamcinolone cream.  This is a topical medication that will help with the itching.  You can also try taking the hydroxyzine tablets which act like Benadryl.  This can sometimes be helpful with itching although it may make you feel sleepy.  Prediabetes: Your numbers look good today.  We do not need to try anything new.  Dry skin on your feet: I recommend looking for a pumice stone at your pharmacy.  This is a course done that you can use to scrape some of the dry skin off of your feet.  And has any signs of infection or dangerous medical issues.

## 2020-03-14 NOTE — Assessment & Plan Note (Addendum)
Trying Gabapentin 300mg  and Triamcinolone 0.1% cream today to help with the pruritis. Let her know that if Gabapentin is not working she can take as many as 3 tablets a day. She can also try taking the hydroxyzine tablets. Let her know that this can make her feel sleepy

## 2020-03-15 DIAGNOSIS — E782 Mixed hyperlipidemia: Secondary | ICD-10-CM | POA: Diagnosis not present

## 2020-03-16 DIAGNOSIS — E782 Mixed hyperlipidemia: Secondary | ICD-10-CM | POA: Diagnosis not present

## 2020-03-17 DIAGNOSIS — E782 Mixed hyperlipidemia: Secondary | ICD-10-CM | POA: Diagnosis not present

## 2020-03-18 DIAGNOSIS — E782 Mixed hyperlipidemia: Secondary | ICD-10-CM | POA: Diagnosis not present

## 2020-03-19 DIAGNOSIS — E782 Mixed hyperlipidemia: Secondary | ICD-10-CM | POA: Diagnosis not present

## 2020-03-20 DIAGNOSIS — E782 Mixed hyperlipidemia: Secondary | ICD-10-CM | POA: Diagnosis not present

## 2020-03-21 DIAGNOSIS — E782 Mixed hyperlipidemia: Secondary | ICD-10-CM | POA: Diagnosis not present

## 2020-03-22 DIAGNOSIS — E782 Mixed hyperlipidemia: Secondary | ICD-10-CM | POA: Diagnosis not present

## 2020-03-23 DIAGNOSIS — E782 Mixed hyperlipidemia: Secondary | ICD-10-CM | POA: Diagnosis not present

## 2020-03-24 DIAGNOSIS — E782 Mixed hyperlipidemia: Secondary | ICD-10-CM | POA: Diagnosis not present

## 2020-03-25 DIAGNOSIS — E782 Mixed hyperlipidemia: Secondary | ICD-10-CM | POA: Diagnosis not present

## 2020-03-26 DIAGNOSIS — E782 Mixed hyperlipidemia: Secondary | ICD-10-CM | POA: Diagnosis not present

## 2020-03-27 DIAGNOSIS — E782 Mixed hyperlipidemia: Secondary | ICD-10-CM | POA: Diagnosis not present

## 2020-03-28 ENCOUNTER — Other Ambulatory Visit: Payer: Self-pay

## 2020-03-28 ENCOUNTER — Telehealth (INDEPENDENT_AMBULATORY_CARE_PROVIDER_SITE_OTHER): Payer: Medicaid Other | Admitting: Pharmacist

## 2020-03-28 DIAGNOSIS — J449 Chronic obstructive pulmonary disease, unspecified: Secondary | ICD-10-CM | POA: Diagnosis not present

## 2020-03-28 DIAGNOSIS — E782 Mixed hyperlipidemia: Secondary | ICD-10-CM | POA: Diagnosis not present

## 2020-03-28 DIAGNOSIS — Z87891 Personal history of nicotine dependence: Secondary | ICD-10-CM

## 2020-03-28 NOTE — Assessment & Plan Note (Signed)
Patient has been experiencing much improved breathing since quitting smoking 02/2019.  She remains a good candidate for long-term abstinence from tobacco.

## 2020-03-28 NOTE — Assessment & Plan Note (Signed)
Patient has been experiencing much improved breathing since quitting smoking 02/2019.  She remains a good candidate for long-term abstinence from tobacco.   - Patient willing to follow-up with her long-term pharmacist RE respimat inhaler technique. Patient medication adherence reasonable overall. .  -no change in any medication today.

## 2020-03-28 NOTE — Progress Notes (Signed)
Reviewed: I agree with Dr. Koval's documentation and management. 

## 2020-03-28 NOTE — Progress Notes (Signed)
   S:    Patient contacted by telephone to follow-up on breathing/ tobacco cessation and covd vaccination question.     Patient was referred and last seen by Primary Care Provider, Dr. Matilde Haymaker on 03/14/2020.   Patient reports breathing has been good since she stopped smoking over 1 year ago.   She denies using Trelegy and states she has not been taking Stiolto respimat either.  She reported that she will discuss the use of this device with her Pharmacist Merrilee Seashore who she trusts and is willing to follow-up with him.   Patient reports adherence to other medications Rescue inhaler use frequency: "not daily" Patient exacerbation hx: minimal in last year  Patient expressed concern over potential future need of "THIRD dose of Covid Vaccine".  She continued that her pharmacist Merrilee Seashore told her that she "might" need another shot in the future.  Following some discussion we agreed that no additional dose of vaccine were planned for her in the near future of any Covid Vaccination.  She has all the vaccine needed.  We will discuss again in the future if needed.    O: Physical Exam - N/A telephone visit   ROS - no complaints   A/P: Patient has been experiencing much improved breathing since quitting smoking 02/2019.  She remains a good candidate for long-term abstinence from tobacco.   - Patient willing to follow-up with her long-term pharmacist RE respimat inhaler technique. Patient medication adherence reasonable overall. .  -no change in any medication today.    -Patient was grateful for the interchange/discussion around Covid vaccination.  She plans to follow up as needed in the future.    Written pt instructions provided.  F/U Clinic visit PRN.  Total time in telephone conversation/interaction.  9 minutes.  Patient visit conducted with Maryan Rued, PharmD PGY-1 Resident.

## 2020-03-28 NOTE — Patient Instructions (Signed)
No change in medication today.    Follow-up PRN.

## 2020-03-29 DIAGNOSIS — E782 Mixed hyperlipidemia: Secondary | ICD-10-CM | POA: Diagnosis not present

## 2020-03-30 DIAGNOSIS — E782 Mixed hyperlipidemia: Secondary | ICD-10-CM | POA: Diagnosis not present

## 2020-03-31 DIAGNOSIS — E782 Mixed hyperlipidemia: Secondary | ICD-10-CM | POA: Diagnosis not present

## 2020-04-01 DIAGNOSIS — E782 Mixed hyperlipidemia: Secondary | ICD-10-CM | POA: Diagnosis not present

## 2020-04-02 DIAGNOSIS — E782 Mixed hyperlipidemia: Secondary | ICD-10-CM | POA: Diagnosis not present

## 2020-04-03 ENCOUNTER — Other Ambulatory Visit: Payer: Self-pay

## 2020-04-03 ENCOUNTER — Telehealth (INDEPENDENT_AMBULATORY_CARE_PROVIDER_SITE_OTHER): Payer: Medicaid Other | Admitting: Family Medicine

## 2020-04-03 ENCOUNTER — Encounter: Payer: Self-pay | Admitting: Family Medicine

## 2020-04-03 DIAGNOSIS — E782 Mixed hyperlipidemia: Secondary | ICD-10-CM | POA: Diagnosis not present

## 2020-04-03 DIAGNOSIS — R21 Rash and other nonspecific skin eruption: Secondary | ICD-10-CM

## 2020-04-03 DIAGNOSIS — L981 Factitial dermatitis: Secondary | ICD-10-CM

## 2020-04-03 MED ORDER — HYDROXYZINE HCL 25 MG PO TABS: 25.0000 mg | ORAL_TABLET | Freq: Three times a day (TID) | ORAL | 2 refills | Status: DC | PRN

## 2020-04-03 NOTE — Progress Notes (Signed)
[place] [facility] Visit  Patient consented to have virtual visit and was identified by name and date of birth. Method of visit: Telephone  We made several attempts to have a video encounter without success before moving forward with a telephone encounter.  Encounter participants: Patient: Rhonda Dominguez - located at out of the home Provider: Matilde Haymaker - located at [facility] Others (if applicable): None  Chief Complaint: Light spots on her skin  HPI:  Neurotic excoriations Rhonda Dominguez has been seen in our clinic multiple times for neurotic excoriations.  Over the phone today, she noted that she continues to have significant itching and white spots on her skin.  She continues to use gabapentin and triamcinolone cream for her itching without significant improvement.  At her last visit, she was given a trial of hydroxyzine 25 mg to help with itching.  She reports that this particular medication was very helpful for her itching and improved her skin.  She would like a refill of her hydroxyzine.  She would also like a referral to dermatology for further assessment of her skin condition.  ROS: per HPI  Pertinent PMHx: Neurotic excoriations, bipolar affective disorder  Exam:  There were no vitals taken for this visit.  Respiratory: Breathing comfortably on room air.  Able to complete long sentences without effort.  Assessment/Plan:  Neurotic excoriations Rhonda Dominguez was informed that a referral to dermatology would likely take several months and would very likely offer no change in management at all.  Rhonda Dominguez acknowledged that she understood and would like a referral to dermatology. -Placed ambulatory referral to dermatology -Refilled hydroxyzine    Time spent during visit with patient: 10 minutes

## 2020-04-03 NOTE — Assessment & Plan Note (Signed)
Ms. Rhonda Dominguez was informed that a referral to dermatology would likely take several months and would very likely offer no change in management at all.  Ms. Rhonda Dominguez acknowledged that she understood and would like a referral to dermatology. -Placed ambulatory referral to dermatology -Refilled hydroxyzine

## 2020-04-04 DIAGNOSIS — E782 Mixed hyperlipidemia: Secondary | ICD-10-CM | POA: Diagnosis not present

## 2020-04-05 DIAGNOSIS — E782 Mixed hyperlipidemia: Secondary | ICD-10-CM | POA: Diagnosis not present

## 2020-04-06 DIAGNOSIS — E782 Mixed hyperlipidemia: Secondary | ICD-10-CM | POA: Diagnosis not present

## 2020-04-06 DIAGNOSIS — Z23 Encounter for immunization: Secondary | ICD-10-CM | POA: Diagnosis not present

## 2020-04-07 DIAGNOSIS — E782 Mixed hyperlipidemia: Secondary | ICD-10-CM | POA: Diagnosis not present

## 2020-04-08 DIAGNOSIS — E782 Mixed hyperlipidemia: Secondary | ICD-10-CM | POA: Diagnosis not present

## 2020-04-09 DIAGNOSIS — E782 Mixed hyperlipidemia: Secondary | ICD-10-CM | POA: Diagnosis not present

## 2020-04-10 DIAGNOSIS — E782 Mixed hyperlipidemia: Secondary | ICD-10-CM | POA: Diagnosis not present

## 2020-04-11 DIAGNOSIS — E782 Mixed hyperlipidemia: Secondary | ICD-10-CM | POA: Diagnosis not present

## 2020-04-12 DIAGNOSIS — E782 Mixed hyperlipidemia: Secondary | ICD-10-CM | POA: Diagnosis not present

## 2020-04-13 DIAGNOSIS — E782 Mixed hyperlipidemia: Secondary | ICD-10-CM | POA: Diagnosis not present

## 2020-04-14 DIAGNOSIS — E782 Mixed hyperlipidemia: Secondary | ICD-10-CM | POA: Diagnosis not present

## 2020-04-15 DIAGNOSIS — E782 Mixed hyperlipidemia: Secondary | ICD-10-CM | POA: Diagnosis not present

## 2020-04-16 DIAGNOSIS — E782 Mixed hyperlipidemia: Secondary | ICD-10-CM | POA: Diagnosis not present

## 2020-04-17 DIAGNOSIS — E782 Mixed hyperlipidemia: Secondary | ICD-10-CM | POA: Diagnosis not present

## 2020-04-18 DIAGNOSIS — E782 Mixed hyperlipidemia: Secondary | ICD-10-CM | POA: Diagnosis not present

## 2020-04-19 DIAGNOSIS — E782 Mixed hyperlipidemia: Secondary | ICD-10-CM | POA: Diagnosis not present

## 2020-04-20 DIAGNOSIS — E782 Mixed hyperlipidemia: Secondary | ICD-10-CM | POA: Diagnosis not present

## 2020-04-21 DIAGNOSIS — E782 Mixed hyperlipidemia: Secondary | ICD-10-CM | POA: Diagnosis not present

## 2020-04-22 DIAGNOSIS — E782 Mixed hyperlipidemia: Secondary | ICD-10-CM | POA: Diagnosis not present

## 2020-04-23 DIAGNOSIS — E782 Mixed hyperlipidemia: Secondary | ICD-10-CM | POA: Diagnosis not present

## 2020-04-24 DIAGNOSIS — E782 Mixed hyperlipidemia: Secondary | ICD-10-CM | POA: Diagnosis not present

## 2020-04-25 DIAGNOSIS — E782 Mixed hyperlipidemia: Secondary | ICD-10-CM | POA: Diagnosis not present

## 2020-04-26 DIAGNOSIS — E782 Mixed hyperlipidemia: Secondary | ICD-10-CM | POA: Diagnosis not present

## 2020-04-27 DIAGNOSIS — E782 Mixed hyperlipidemia: Secondary | ICD-10-CM | POA: Diagnosis not present

## 2020-04-28 DIAGNOSIS — E782 Mixed hyperlipidemia: Secondary | ICD-10-CM | POA: Diagnosis not present

## 2020-04-29 DIAGNOSIS — E782 Mixed hyperlipidemia: Secondary | ICD-10-CM | POA: Diagnosis not present

## 2020-04-30 DIAGNOSIS — E782 Mixed hyperlipidemia: Secondary | ICD-10-CM | POA: Diagnosis not present

## 2020-05-01 DIAGNOSIS — E782 Mixed hyperlipidemia: Secondary | ICD-10-CM | POA: Diagnosis not present

## 2020-05-02 DIAGNOSIS — E782 Mixed hyperlipidemia: Secondary | ICD-10-CM | POA: Diagnosis not present

## 2020-05-03 DIAGNOSIS — E782 Mixed hyperlipidemia: Secondary | ICD-10-CM | POA: Diagnosis not present

## 2020-05-04 DIAGNOSIS — E782 Mixed hyperlipidemia: Secondary | ICD-10-CM | POA: Diagnosis not present

## 2020-05-05 ENCOUNTER — Other Ambulatory Visit: Payer: Self-pay | Admitting: Registered Nurse

## 2020-05-05 ENCOUNTER — Other Ambulatory Visit: Payer: Self-pay | Admitting: Family Medicine

## 2020-05-05 DIAGNOSIS — E782 Mixed hyperlipidemia: Secondary | ICD-10-CM | POA: Diagnosis not present

## 2020-05-06 DIAGNOSIS — E782 Mixed hyperlipidemia: Secondary | ICD-10-CM | POA: Diagnosis not present

## 2020-05-07 DIAGNOSIS — E782 Mixed hyperlipidemia: Secondary | ICD-10-CM | POA: Diagnosis not present

## 2020-05-08 DIAGNOSIS — E782 Mixed hyperlipidemia: Secondary | ICD-10-CM | POA: Diagnosis not present

## 2020-05-09 DIAGNOSIS — E782 Mixed hyperlipidemia: Secondary | ICD-10-CM | POA: Diagnosis not present

## 2020-05-10 DIAGNOSIS — E782 Mixed hyperlipidemia: Secondary | ICD-10-CM | POA: Diagnosis not present

## 2020-05-11 DIAGNOSIS — E782 Mixed hyperlipidemia: Secondary | ICD-10-CM | POA: Diagnosis not present

## 2020-05-12 DIAGNOSIS — E782 Mixed hyperlipidemia: Secondary | ICD-10-CM | POA: Diagnosis not present

## 2020-05-13 DIAGNOSIS — E782 Mixed hyperlipidemia: Secondary | ICD-10-CM | POA: Diagnosis not present

## 2020-05-14 DIAGNOSIS — E782 Mixed hyperlipidemia: Secondary | ICD-10-CM | POA: Diagnosis not present

## 2020-05-15 DIAGNOSIS — E782 Mixed hyperlipidemia: Secondary | ICD-10-CM | POA: Diagnosis not present

## 2020-05-16 DIAGNOSIS — E782 Mixed hyperlipidemia: Secondary | ICD-10-CM | POA: Diagnosis not present

## 2020-05-17 DIAGNOSIS — E782 Mixed hyperlipidemia: Secondary | ICD-10-CM | POA: Diagnosis not present

## 2020-05-18 DIAGNOSIS — E782 Mixed hyperlipidemia: Secondary | ICD-10-CM | POA: Diagnosis not present

## 2020-05-19 ENCOUNTER — Other Ambulatory Visit: Payer: Self-pay

## 2020-05-19 ENCOUNTER — Ambulatory Visit (INDEPENDENT_AMBULATORY_CARE_PROVIDER_SITE_OTHER): Payer: Medicaid Other | Admitting: Family Medicine

## 2020-05-19 DIAGNOSIS — E782 Mixed hyperlipidemia: Secondary | ICD-10-CM | POA: Diagnosis not present

## 2020-05-19 DIAGNOSIS — L03011 Cellulitis of right finger: Secondary | ICD-10-CM | POA: Insufficient documentation

## 2020-05-19 MED ORDER — SULFAMETHOXAZOLE-TRIMETHOPRIM 800-160 MG PO TABS: 1.0000 | ORAL_TABLET | Freq: Two times a day (BID) | ORAL | 0 refills | Status: AC

## 2020-05-19 NOTE — Progress Notes (Signed)
    SUBJECTIVE:   CHIEF COMPLAINT / HPI:   Finger swelling: Patient was gardening yesterday and started noticing swelling in her right middle finger afterwards that continued to worsen overnight.  She says she cannot get any sleep because of the pain.  She does not recall getting poked or stabbed by anything did not notice any splinters in her hand.  She denies fever.  She repeatedly says that her "pressures are up" because of her anxiety.  Discussed with the patient the benefit of doing a I&D on the area given its swelling, but patient states that she does not like needles and it would make her too anxious and "get her pressures up".   PERTINENT  PMH / PSH: Hypertension  OBJECTIVE:   BP 140/90 Comment: 2nd attempt provider informed  Pulse 87   Ht 5\' 7"  (1.702 m)   Wt 163 lb (73.9 kg)   SpO2 99%   BMI 25.53 kg/m   General: Alert.  Very anxious. Extremities: Patient's right middle finger has swelling just below the nailbed on the medial side with some surrounding erythema that does not go beyond the distal interphalangeal joint.  When patient makes a fist the affected finger remains partially extended.     ASSESSMENT/PLAN:   Paronychia of right middle finger Patient developed acute paronychia after gardening yesterday.  Most likely infection introduced through the soil or a splinter, but no obvious site of traumatic injury seen.  Discussed with patient that the best option would be oral antibiotics and combination with brief incision and drainage, but patient was not willing to have incision and drainage at this time.  Discussed with patient that if infection spreads while on the antibiotics or she develops systemic symptoms she should go to the emergency department or urgent care given that we are not open on the weekends.  Advised patient that if she is completing her in route a course next week and her finger has not improved, she should schedule another appointment with [place] to  reevaluate and possibly I&D. -7 days Bactrim double strength twice daily -Warm soak with diluted vinegar for 15 minutes 2-3 times a day     Benay Pike, MD [place]

## 2020-05-19 NOTE — Assessment & Plan Note (Signed)
Patient developed acute paronychia after gardening yesterday.  Most likely infection introduced through the soil or a splinter, but no obvious site of traumatic injury seen.  Discussed with patient that the best option would be oral antibiotics and combination with brief incision and drainage, but patient was not willing to have incision and drainage at this time.  Discussed with patient that if infection spreads while on the antibiotics or she develops systemic symptoms she should go to the emergency department or urgent care given that we are not open on the weekends.  Advised patient that if she is completing her in route a course next week and her finger has not improved, she should schedule another appointment with [place] to reevaluate and possibly I&D. -7 days Bactrim double strength twice daily -Warm soak with diluted vinegar for 15 minutes 2-3 times a day

## 2020-05-19 NOTE — Patient Instructions (Signed)
It was nice to meet you today,  You have something called a paronychia which is a nailbed infection.  The best treatment for this is to drain the abscess and give you antibiotics, but after talking we have decided you would rather try the antibiotics by themselves first.  I have prescribed Bactrim for you to take twice a day for the next 7 days.  If your swelling gets worse and spreads along your hand while taking this medication, you should seek medical attention because at that point we may have to drain it.  If at the end of 7 days your infection has not improved, you can make an appointment to reevaluate with [place] again in our clinic.  I would also like you to soak your hand for 10 to 15 minutes 2-3 times a day in a solution containing warm water and diluted plain vinegar.  If you have any concerns or questions, please call our office.  Have a great day,  Clemetine Marker, MD

## 2020-05-20 DIAGNOSIS — E782 Mixed hyperlipidemia: Secondary | ICD-10-CM | POA: Diagnosis not present

## 2020-05-21 DIAGNOSIS — E782 Mixed hyperlipidemia: Secondary | ICD-10-CM | POA: Diagnosis not present

## 2020-05-22 DIAGNOSIS — E782 Mixed hyperlipidemia: Secondary | ICD-10-CM | POA: Diagnosis not present

## 2020-05-23 ENCOUNTER — Telehealth: Payer: Self-pay | Admitting: Pharmacist

## 2020-05-23 DIAGNOSIS — E782 Mixed hyperlipidemia: Secondary | ICD-10-CM | POA: Diagnosis not present

## 2020-05-23 NOTE — Telephone Encounter (Signed)
Patient contacted in follow-up of tobacco cessatino 02/2019.  She was called shortly after her 59th birthday to celebrate her first year to being tobacco free.  She expressed thanks for past assistance and confidence in future abstinence from tobacco.  In fact, she shared that "I don't even drink a beer anymore".    I will follow-up with her by phone intermittently in the future but plan to scheduled call with her at this time.

## 2020-05-24 DIAGNOSIS — E782 Mixed hyperlipidemia: Secondary | ICD-10-CM | POA: Diagnosis not present

## 2020-05-25 DIAGNOSIS — E782 Mixed hyperlipidemia: Secondary | ICD-10-CM | POA: Diagnosis not present

## 2020-05-26 DIAGNOSIS — E782 Mixed hyperlipidemia: Secondary | ICD-10-CM | POA: Diagnosis not present

## 2020-05-27 DIAGNOSIS — E782 Mixed hyperlipidemia: Secondary | ICD-10-CM | POA: Diagnosis not present

## 2020-05-28 DIAGNOSIS — E782 Mixed hyperlipidemia: Secondary | ICD-10-CM | POA: Diagnosis not present

## 2020-05-29 DIAGNOSIS — E782 Mixed hyperlipidemia: Secondary | ICD-10-CM | POA: Diagnosis not present

## 2020-05-30 DIAGNOSIS — E782 Mixed hyperlipidemia: Secondary | ICD-10-CM | POA: Diagnosis not present

## 2020-05-31 DIAGNOSIS — E782 Mixed hyperlipidemia: Secondary | ICD-10-CM | POA: Diagnosis not present

## 2020-06-01 DIAGNOSIS — E782 Mixed hyperlipidemia: Secondary | ICD-10-CM | POA: Diagnosis not present

## 2020-06-02 DIAGNOSIS — E782 Mixed hyperlipidemia: Secondary | ICD-10-CM | POA: Diagnosis not present

## 2020-06-03 DIAGNOSIS — E782 Mixed hyperlipidemia: Secondary | ICD-10-CM | POA: Diagnosis not present

## 2020-06-06 ENCOUNTER — Ambulatory Visit: Payer: Medicaid Other | Admitting: Family Medicine

## 2020-06-06 ENCOUNTER — Telehealth: Payer: Self-pay | Admitting: Family Medicine

## 2020-06-06 NOTE — Telephone Encounter (Signed)
Patient called to cancel her appt for today - she states 'I have a little bit of a temperature.' Patient requesting Dr. Pilar Plate call her.

## 2020-06-07 NOTE — Telephone Encounter (Signed)
Attempted to call pt to get more information as to the reason she needs a phone call from Dr. Pilar Plate. No answer. LVM for pt to call the office to give [place] a little more information about what it is she is needing. Salvatore Marvel, CMA

## 2020-06-07 NOTE — Telephone Encounter (Signed)
Spoke with Rhonda Dominguez. She informed me that she had a temperature of 102 and that her blood pressure was high. She said her nurse was there getting her medications. She was unable to tell me what her blood pressure was. I advised her to stay well hydrated and to take a fever reducer. I offered to make an appt to be seen but she said she would make one after the 4th of July. And if she is still feeling bad she will go to the hospital. Pt also wanted to see if we can get her a free fall alert alarm, for when she takes a shower she falls a lot. Salvatore Marvel, CMA

## 2020-06-14 ENCOUNTER — Telehealth: Payer: Self-pay | Admitting: Family Medicine

## 2020-06-14 NOTE — Telephone Encounter (Signed)
Opened in error

## 2020-06-15 ENCOUNTER — Telehealth: Payer: Self-pay | Admitting: Family Medicine

## 2020-06-15 NOTE — Telephone Encounter (Signed)
Request to stay in [facility] Medicaid form dropped off for at front desk for completion.  Verified that patient section of form has been completed.  Last DOS/WCC with PCP was 05/19/20.  Placed form in team folder to be completed by clinical staff.  Rhonda Dominguez

## 2020-06-16 NOTE — Telephone Encounter (Signed)
Spoke with pt informed her that in order for [place] to fill out the form. She must fill out the last sheet #6 given [place] permission to fill out the Medicaid form. She stated that she has an appt on 7/12 and that she will fill it out then . That she will ask for me. I will put the form back in blue teams folder so that she can sign the form when she is checking in for her appt. Salvatore Marvel, CMA

## 2020-06-19 ENCOUNTER — Ambulatory Visit (INDEPENDENT_AMBULATORY_CARE_PROVIDER_SITE_OTHER): Payer: Medicaid Other | Admitting: Family Medicine

## 2020-06-19 ENCOUNTER — Other Ambulatory Visit: Payer: Self-pay

## 2020-06-19 VITALS — BP 120/80 | HR 80 | Ht 67.0 in | Wt 163.4 lb

## 2020-06-19 DIAGNOSIS — M5410 Radiculopathy, site unspecified: Secondary | ICD-10-CM | POA: Diagnosis not present

## 2020-06-19 DIAGNOSIS — M5416 Radiculopathy, lumbar region: Secondary | ICD-10-CM | POA: Diagnosis not present

## 2020-06-19 DIAGNOSIS — R7303 Prediabetes: Secondary | ICD-10-CM

## 2020-06-19 DIAGNOSIS — G8929 Other chronic pain: Secondary | ICD-10-CM | POA: Diagnosis not present

## 2020-06-19 DIAGNOSIS — K219 Gastro-esophageal reflux disease without esophagitis: Secondary | ICD-10-CM | POA: Diagnosis not present

## 2020-06-19 DIAGNOSIS — I10 Essential (primary) hypertension: Secondary | ICD-10-CM | POA: Diagnosis not present

## 2020-06-19 DIAGNOSIS — M5386 Other specified dorsopathies, lumbar region: Secondary | ICD-10-CM

## 2020-06-19 DIAGNOSIS — L981 Factitial dermatitis: Secondary | ICD-10-CM

## 2020-06-19 DIAGNOSIS — J449 Chronic obstructive pulmonary disease, unspecified: Secondary | ICD-10-CM | POA: Diagnosis not present

## 2020-06-19 DIAGNOSIS — M79604 Pain in right leg: Secondary | ICD-10-CM

## 2020-06-19 LAB — POCT GLYCOSYLATED HEMOGLOBIN (HGB A1C): HbA1c, POC (controlled diabetic range): 5.7 % (ref 0.0–7.0)

## 2020-06-19 MED ORDER — HYDROCHLOROTHIAZIDE 25 MG PO TABS: 25.0000 mg | ORAL_TABLET | Freq: Every day | ORAL | 3 refills | Status: DC

## 2020-06-19 MED ORDER — AMLODIPINE BESYLATE 10 MG PO TABS: 10.0000 mg | ORAL_TABLET | Freq: Every day | ORAL | 2 refills | Status: DC

## 2020-06-19 MED ORDER — FAMOTIDINE 20 MG PO TABS: 20.0000 mg | ORAL_TABLET | Freq: Two times a day (BID) | ORAL | 6 refills | Status: DC

## 2020-06-19 NOTE — Assessment & Plan Note (Signed)
-  Refilled famotidine 20 mg

## 2020-06-19 NOTE — Patient Instructions (Signed)
Ms. Rozeboom left the clinic before she was able to receive her after visit summary.  She was aware that she needed to pick up her paperwork this coming Friday.

## 2020-06-19 NOTE — Assessment & Plan Note (Signed)
Stable. -Refilled amlodipine -Refilled hydrochlorothiazide -Check BMP today

## 2020-06-19 NOTE — Progress Notes (Signed)
    SUBJECTIVE:   CHIEF COMPLAINT / HPI:   Leg pain Rhonda Dominguez reports that she has had some tingling leg pain it has been worse for the past week.  She is not sure what started it.  It seems to be on both sides and is irritating with walking.  She has not noticed any lower extremity swelling.  She occasionally notices some shortness of breath with exertion.  Hypertension She continues to take amlodipine 10 mg and hydrochlorothiazide 25 mg daily.  She is requesting medication refills.  Neurotic excoriations She reports that her itching has been generally well controlled with gabapentin and triamcinolone.  She would like refills on her triamcinolone.  GERD Has previously taken famotidine 20 mg daily.  She is out of medication and would like more.  She does continue to experience occasional abdominal pain consistent with her previous reflux symptoms.  Change of insurance She has left the paperwork to be filled out so that she can transition back to Medicaid from [place] [employer].  PERTINENT  PMH / PSH: Bipolar affective disorder, restrictive lung disease, COPD, hypertension  OBJECTIVE:   BP 120/80   Pulse 80   Ht 5\' 7"  (1.702 m)   Wt 163 lb 6 oz (74.1 kg)   SpO2 97%   BMI 25.59 kg/m    General: Alert and cooperative and appears to be in no acute distress.  She was able to get up out of her chair and walk comfortably around clinic to get her lab work done. Cardio: Normal S1 and S2, no S3 or S4. Rhythm is regular. No murmurs or rubs.   Pulm: Clear to auscultation bilaterally, no crackles, wheezing, or diminished breath sounds. Normal respiratory effort Abdomen: Bowel sounds normal. Abdomen soft and non-tender.  Extremities: No peripheral edema. Warm/ well perfused.  Strong radial pulse, very mild tenderness to palpation of the soleus bilaterally.  No notable erythema or swelling on exam.  Excoriations noted over lower extremities. Neuro: Cranial nerves grossly  intact   ASSESSMENT/PLAN:   Neurotic excoriations Stable. -Refilled gabapentin -Refill triamcinolone  Essential hypertension Stable. -Refilled amlodipine -Refilled hydrochlorothiazide -Check BMP today  Chronic lower extremity pain (Tertiary Area of Pain) (Right) No significant changes.  Walking comfortably today in clinic.  She does seem to have increased discomfort in the past week.  She was encouraged to continue gabapentin, SNRI.Marland Kitchen  GERD (gastroesophageal reflux disease) -Refilled famotidine 20 mg   Paperwork She was encouraged to pick up her paperwork in the front office in 2 days.  Rhonda Haymaker, MD [place]

## 2020-06-19 NOTE — Assessment & Plan Note (Signed)
Stable. -Refilled gabapentin -Refill triamcinolone

## 2020-06-19 NOTE — Assessment & Plan Note (Addendum)
No significant changes.  Walking comfortably today in clinic.  She does seem to have increased discomfort in the past week.  She was encouraged to continue gabapentin, SNRI.Marland Kitchen

## 2020-06-20 ENCOUNTER — Other Ambulatory Visit: Payer: Self-pay | Admitting: Family Medicine

## 2020-06-20 DIAGNOSIS — R21 Rash and other nonspecific skin eruption: Secondary | ICD-10-CM

## 2020-06-20 LAB — BASIC METABOLIC PANEL
BUN/Creatinine Ratio: 16 (ref 12–28)
BUN: 14 mg/dL (ref 8–27)
CO2: 24 mmol/L (ref 20–29)
Calcium: 10.5 mg/dL — ABNORMAL HIGH (ref 8.7–10.3)
Chloride: 107 mmol/L — ABNORMAL HIGH (ref 96–106)
Creatinine, Ser: 0.89 mg/dL (ref 0.57–1.00)
GFR calc Af Amer: 81 mL/min/{1.73_m2} (ref >59)
GFR calc non Af Amer: 71 mL/min/{1.73_m2} (ref >59)
Glucose: 92 mg/dL (ref 65–99)
Potassium: 4.5 mmol/L (ref 3.5–5.2)
Sodium: 145 mmol/L — ABNORMAL HIGH (ref 134–144)

## 2020-06-20 LAB — CBC
Hematocrit: 39.2 % (ref 34.0–46.6)
Hemoglobin: 12.7 g/dL (ref 11.1–15.9)
MCH: 25.9 pg — ABNORMAL LOW (ref 26.6–33.0)
MCHC: 32.4 g/dL (ref 31.5–35.7)
MCV: 80 fL (ref 79–97)
Platelets: 254 10*3/uL (ref 150–450)
RBC: 4.9 x10E6/uL (ref 3.77–5.28)
RDW: 14.6 % (ref 11.7–15.4)
WBC: 7.4 10*3/uL (ref 3.4–10.8)

## 2020-06-21 ENCOUNTER — Other Ambulatory Visit: Payer: Self-pay | Admitting: Family Medicine

## 2020-06-21 MED ORDER — MELOXICAM 7.5 MG PO TABS: 7.5000 mg | ORAL_TABLET | Freq: Every day | ORAL | 0 refills | Status: DC

## 2020-06-23 ENCOUNTER — Telehealth: Payer: Self-pay | Admitting: Family Medicine

## 2020-06-23 NOTE — Telephone Encounter (Signed)
Patient just called checking status of paperwork, that she gave to doctor frank. Doctor Pilar Plate documented/ told patient that paperwork would be done Friday morning. But policy is 7 to 10 day turn around. Tried explaining this to patient and that paperwork is in PCP's box waiting to be completed. Patient became ill and very rude stating "you dont know what the f**k you are doing", and demanded to speak to [place]. Admin but told patient she was in clinic and I told patient I would get her to call her later and patient still demanded to speak to her now.   Routing to PCP and [place].

## 2020-06-23 NOTE — Telephone Encounter (Signed)
Patient asked to speak to me after talking to [facility]. I also informed the patient that the paperwork is in the doctors box and that her portion had to be completed before Dr. Pilar Plate is able to complete his portion. I also informed her of the turn around time. Patient continued to call me and Shae liars. She would like for jessica to call her to discuss.   The best call back number is [phone].

## 2020-06-23 NOTE — Telephone Encounter (Signed)
Form filled out and left ar front desk for pt.  Please call Ms. Champagne and let her know that it's ready for her to pick up.  Matilde Haymaker, MD

## 2020-06-27 NOTE — Telephone Encounter (Signed)
LMOVM for pt to call me back to discuss her interaction with staff.  Will await callback.  Christen Bame, CMA

## 2020-07-03 ENCOUNTER — Other Ambulatory Visit: Payer: Self-pay

## 2020-07-03 ENCOUNTER — Ambulatory Visit (INDEPENDENT_AMBULATORY_CARE_PROVIDER_SITE_OTHER): Payer: Medicaid Other | Admitting: Family Medicine

## 2020-07-03 ENCOUNTER — Telehealth: Payer: Self-pay

## 2020-07-03 ENCOUNTER — Encounter: Payer: Self-pay | Admitting: Family Medicine

## 2020-07-03 DIAGNOSIS — G90521 Complex regional pain syndrome I of right lower limb: Secondary | ICD-10-CM | POA: Diagnosis not present

## 2020-07-03 DIAGNOSIS — Z713 Dietary counseling and surveillance: Secondary | ICD-10-CM | POA: Insufficient documentation

## 2020-07-03 MED ORDER — GABAPENTIN 300 MG PO CAPS: 300.0000 mg | ORAL_CAPSULE | Freq: Three times a day (TID) | ORAL | 1 refills | Status: DC

## 2020-07-03 NOTE — Progress Notes (Signed)
    SUBJECTIVE:   CHIEF COMPLAINT / HPI:   Hypercalcemia Ms. Mccroskey has return to clinic to discuss previous lab results of an elevated calcium.  She has not been having trouble with constipation or muscle spasms.  She is interested in understanding better what might cause this elevated calcium.  Desire for weight loss and healthy nutrition She is previously been seen in clinic and expressed an interest in good nutrition counseling for weight loss and healthy lifestyle.  She would like to be connected to nutritionist.  Right foot injury She reports that she stepped on a nail yesterday and wants antibiotics.  She will continue with his older shoes.  She is not aware of any broken skin or significant damage to her foot.  She is able to walk comfortably without issue.  She has not noticed any redness or drainage from her right foot.  PERTINENT  PMH / PSH: Hypertension, complex regional pain syndrome, chronic knee pain  OBJECTIVE:   BP 128/82   Pulse 80   Ht 5\' 7"  (1.702 m)   Wt 165 lb (74.8 kg)   SpO2 97%   BMI 25.84 kg/m    General: Alert and cooperative and appears to be in no acute distress HEENT: No palpable masses or abnormalities over or along the trachea.  Moist mucous membranes. Respiratory: Breathing comfortably on room air.  No respiratory distress. Right foot exam: No broken skin or evidence of traumatic injury.  No pain my exam.  Good toe flexion and extension.    ASSESSMENT/PLAN:   Nutritional counseling Contact information provided for Dr. Jenne Campus  Hypercalcemia Mildly elevated in the past year-10.3 and 10.5.  Differential includes hyperparathyroidism or side effect of a thiazide diuretic. -Follow-up PTH and calcium studies   Right foot injury No evidence of broken skin, infection, purulence.  No indication for antibiotic treatment at this time.  We will continue to monitor.  Matilde Haymaker, MD [place]

## 2020-07-03 NOTE — Telephone Encounter (Signed)
Bessie from [place], calls nurse line regarding patient's request to change from [facility] to Medicaid. Attempted to answer all questions, however, representative has provider specific questions that needs to be answered in order for claim to proceed.   Will forward to PCP  Contact info: [phone]  Talbot Grumbling, RN

## 2020-07-03 NOTE — Assessment & Plan Note (Signed)
Contact information provided for Dr. Jenne Campus

## 2020-07-03 NOTE — Patient Instructions (Signed)
Elevated calcium: I will let you know if there are any abnormalities with your lab work.  Nutrition concerns: Last time you were here, we talked about calling Dr. Jenne Campus for help with further nutrition and weight loss.  Please call her on the phone to schedule an appointment.  Dr. Dionisio Paschal phone number is [phone]

## 2020-07-03 NOTE — Assessment & Plan Note (Signed)
Mildly elevated in the past year-10.3 and 10.5.  Differential includes hyperparathyroidism or side effect of a thiazide diuretic. -Follow-up PTH and calcium studies

## 2020-07-04 ENCOUNTER — Ambulatory Visit: Payer: Medicaid Other | Admitting: Family Medicine

## 2020-07-04 ENCOUNTER — Telehealth: Payer: Self-pay | Admitting: Family Medicine

## 2020-07-04 LAB — PTH, INTACT AND CALCIUM
Calcium: 9.9 mg/dL (ref 8.7–10.3)
PTH: 80 pg/mL — ABNORMAL HIGH (ref 15–65)

## 2020-07-04 NOTE — Telephone Encounter (Signed)
Will forward to MD. Jacere Pangborn,CMA  

## 2020-07-04 NOTE — Telephone Encounter (Signed)
Patient had a missed call from our office and was wondering if it was about her lab results from yesterday. Told patient there wasn't a note stating anyone had called her, but they just may not of had time to put message in yet.   Please call patient back with these results.

## 2020-07-05 ENCOUNTER — Other Ambulatory Visit: Payer: Self-pay | Admitting: Family Medicine

## 2020-07-05 NOTE — Progress Notes (Signed)
Called Rhonda Dominguez and informed her that she has an elevated PTH with a normal calcium level.  We are going to draw additional blood work to see if this is related to her vitamin D level.  Labs were ordered.  She is planning to have them done on 12/29.

## 2020-07-05 NOTE — Telephone Encounter (Signed)
Called Rhonda Dominguez from [place] and discussed her questions. Nothing further to do at this time.  Matilde Haymaker, MD

## 2020-07-06 ENCOUNTER — Ambulatory Visit: Payer: Medicaid Other | Admitting: Family Medicine

## 2020-07-06 ENCOUNTER — Other Ambulatory Visit: Payer: Self-pay

## 2020-07-06 ENCOUNTER — Other Ambulatory Visit: Payer: Self-pay | Admitting: Family Medicine

## 2020-07-06 ENCOUNTER — Telehealth: Payer: Self-pay | Admitting: Family Medicine

## 2020-07-06 ENCOUNTER — Other Ambulatory Visit: Payer: Medicaid Other

## 2020-07-06 DIAGNOSIS — G90521 Complex regional pain syndrome I of right lower limb: Secondary | ICD-10-CM

## 2020-07-06 NOTE — Telephone Encounter (Signed)
Spoke with patient and she is needing a new referral for her pain doctor.  She was informed by her old office to get her referral sent to [facility].  Will ask pcp to place this referral.  Lilyth Lawyer,CMA

## 2020-07-06 NOTE — Telephone Encounter (Signed)
Referral placed. Collier [place]ina

## 2020-07-06 NOTE — Patient Instructions (Addendum)
  Diet Recommendations for Diabetes  Carbohydrate includes starch, sugar, and fiber.  Of these, only sugar and starch raise blood glucose.  (Fiber is found in fruits, vegetables [especially skin, seeds, and stalks] and whole grains.)   Starchy (carb) foods: Bread, rice, pasta, potatoes, corn, cereal, grits, crackers, bagels, muffins, all baked goods.  (Fruit, milk, and yogurt also have carbohydrate, but most of these foods will not spike your blood sugar as most starchy foods will.)  A few fruits do cause high blood sugars; use small portions of bananas (limit to 1/2 at a time), grapes, watermelon, oranges, and most tropical fruits.   Protein foods: Meat, fish, poultry, eggs, dairy foods, and beans such as pinto and kidney beans (beans also provide carbohydrate).   1. Eat at least REAL 3 meals and 1-2 snacks per day. Never go more than 4-5 hours while awake without eating. Eat breakfast within the first hour of getting up.   2. Limit starchy foods to TWO per meal and ONE per snack. ONE portion of a starchy food is equal to the following:   - ONE slice of bread (or its equivalent, such as half of a hamburger bun).   - 1/2 cup of a "scoopable" starchy food such as potatoes or rice.   - 15 grams of Total Carbohydrate as shown on food label.   - Every 4 ounces of a sweet drink (including fruit juice). 3. Include at every meal: a protein food, a carb food, and vegetables and/or fruit.   - Obtain twice the volume of veg's as protein or carbohydrate foods for both lunch and dinner.   - Fresh or frozen veg's are best.   - Keep frozen veg's on hand for a quick vegetable serving.     Please schedule follow-up visit with Dr. Caron Presume towards the end of August.

## 2020-07-06 NOTE — Progress Notes (Signed)
Nutr Encounter I met with Rhonda Dominguez (MRN [id number]) on 07/06/2020 in the [facility] clinic, along with Iver Nestle, PhD, RD, LDN.   Appt start time: 0930 end time: 1015 (45)  Reason for visit: Prediabetes   Relevant history/background: Prediabetes (diagnosed in September 2020, most recent A1C 6.0), Hypercalcemia w/ elevated PTH, hypertension,   Assessment:  Usual eating pattern: 3 meals and 6-8 snacks per day. Frequent foods and beverages: A lot of bread, cranberry juice and water .   Usual physical activity: None at this time. Sleep: Patient estimates average of 5-6 hours of sleep/night, will nap 4-5 hours during the day.  24-hr recall:  (Up at 5 AM)   B ( 530 AM)-  1 boiled egg, 7 strips of bacon, 1 cup of grits, 1 banana, 1 bottle of ensure, water  Snk ( AM)-  No snack  L ( 1 PM)-  2 [place], ham, roast beef sandwich with let, cheese, tomatoes, 1/2 tbs mayo, 1 ounce potato chips, banana, 1/2 cup yogurt, water  Snk (2 PM)-  Full sized almond joy and payday  D (8 PM)-  12 crab legs, salad ranch dressing- 1-2 tbsp, water Snk ( 12 AM)-  Honey bun, almond joy, payday  Typical day? Yes.     Intervention: Completed diet and exercise history, and established: - SMART behavioral goals (Smart Goals: Specific, Measurable, Action-oriented, Realistic, Time-based.)  Diet Recommendations to prevent Diabetes  Carbohydrate includes starch, sugar, and fiber.  Of these, only sugar and starch raise blood glucose.  (Fiber is found in fruits, vegetables [especially skin, seeds, and stalks] and whole grains.)   Starchy (carb) foods: Bread, rice, pasta, potatoes, corn, cereal, grits, crackers, bagels, muffins, all baked goods.  (Fruit, milk, and yogurt also have carbohydrate, but most of these foods will not spike your blood sugar as most starchy foods will.)  A few fruits do cause high blood sugars; use small portions of bananas (limit to 1/2 at a time), grapes, watermelon, oranges, and most tropical  fruits.   Protein foods: Meat, fish, poultry, eggs, dairy foods, and beans such as pinto and kidney beans (beans also provide carbohydrate).   1. Eat at least REAL 3 meals and 1-2 snacks per day. Never go more than 4-5 hours while awake without eating. Eat breakfast within the first hour of getting up.   2. Limit starchy foods to TWO per meal and ONE per snack. ONE portion of a starchy food is equal to the following:   - ONE slice of bread (or its equivalent, such as half of a hamburger bun).   - 1/2 cup of a "scoopable" starchy food such as potatoes or rice.   - 15 grams of Total Carbohydrate as shown on food label.   - Every 4 ounces of a sweet drink (including fruit juice). 3. Include at every meal: a protein food, a carb food, and vegetables and/or fruit.   - Obtain twice the volume of veg's as protein or carbohydrate foods for both lunch and dinner.   - Fresh or frozen veg's are best.   - Keep frozen veg's on hand for a quick vegetable serving.       For recommendations and goals, see Patient Instructions.    Follow-up: in 3-4 weeks with me.   Gifford Shave

## 2020-07-06 NOTE — Progress Notes (Unsigned)
amb ref chronic

## 2020-07-06 NOTE — Telephone Encounter (Signed)
See orders only note from Dr. Pilar Plate.  Nyia Tsao,CMA

## 2020-07-06 NOTE — Telephone Encounter (Signed)
Pt is requesting a referral to [facility] [place] Ph # [phone]  Pt.s ph# [phone]

## 2020-07-11 ENCOUNTER — Telehealth: Payer: Self-pay | Admitting: Family Medicine

## 2020-07-11 NOTE — Telephone Encounter (Signed)
Patient calling for her lab results from her visit on July 29th. Please call patient back with this information.

## 2020-07-11 NOTE — Telephone Encounter (Signed)
Will forward to MD. Shatira Dobosz,CMA  

## 2020-07-14 LAB — VITAMIN D 1,25 DIHYDROXY
Vitamin D 1, 25 (OH)2 Total: 87 pg/mL — ABNORMAL HIGH
Vitamin D2 1, 25 (OH)2: <10 pg/mL
Vitamin D3 1, 25 (OH)2: 81 pg/mL

## 2020-07-14 LAB — CALCIUM, IONIZED: Calcium, Ion: 5.5 mg/dL (ref 4.5–5.6)

## 2020-07-15 IMAGING — DX DG LUMBAR SPINE COMPLETE 4+V
5 series · 5 of 5 positions shown · non-contrast
Comparison: 04/11/2010.

CLINICAL DATA: 57-year-old female. No injury. Chronic right leg
pain and right flank pain. Initial encounter.

EXAM:
LUMBAR SPINE - COMPLETE 4+ VIEW

[t lumbar spine ap]
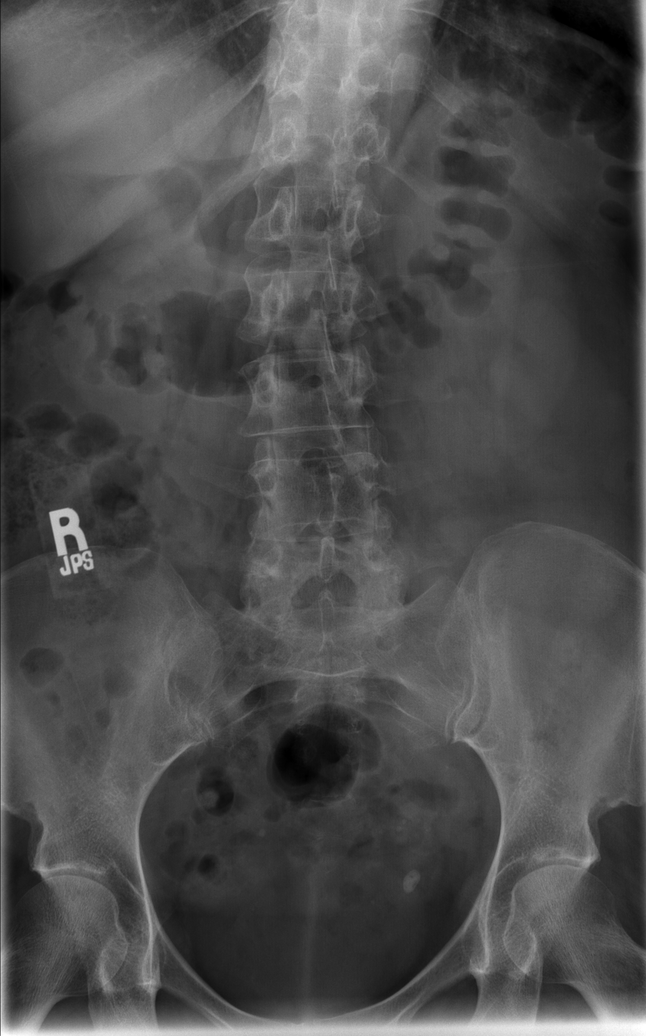

[t lumbar spine obl (1 of 2)]
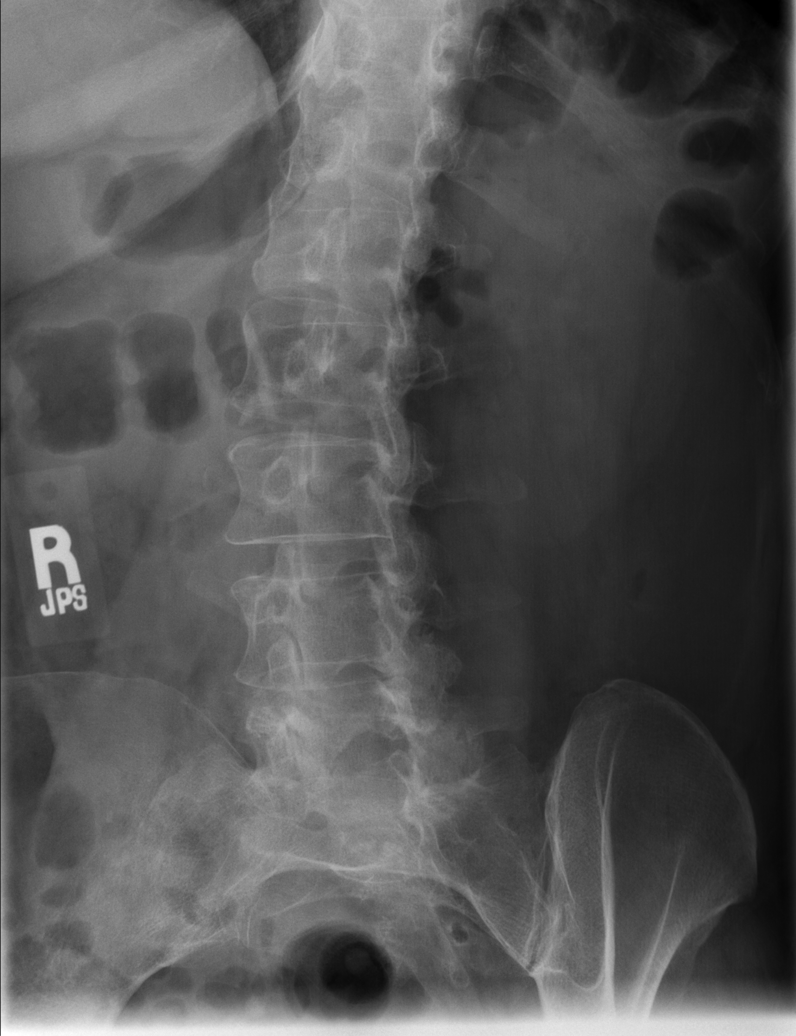

[t lumbar spine obl (2 of 2)]
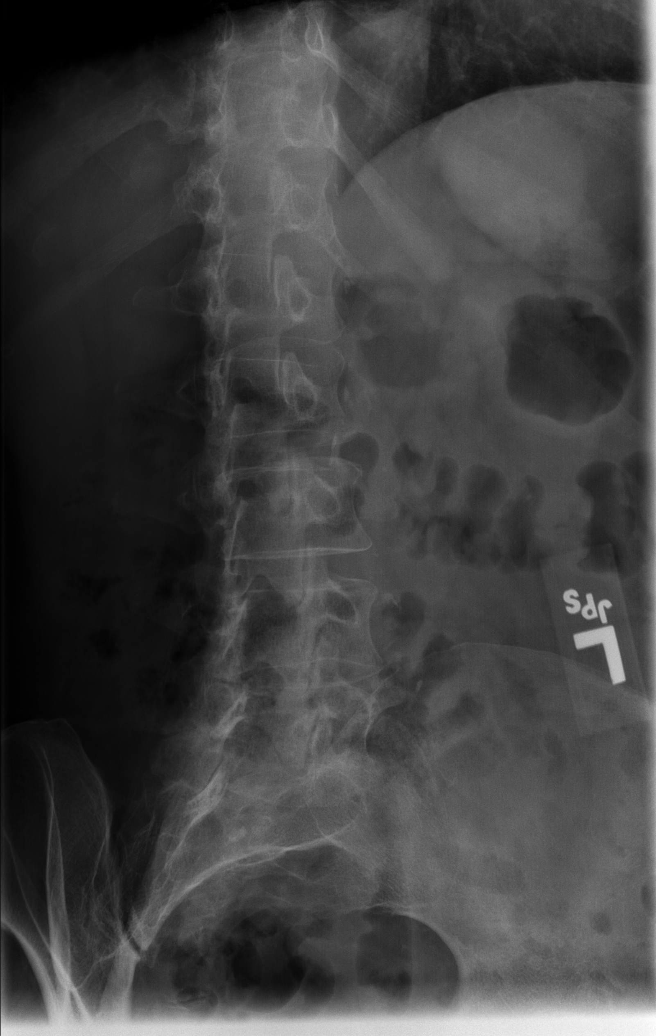

[t lumbar spine lat]
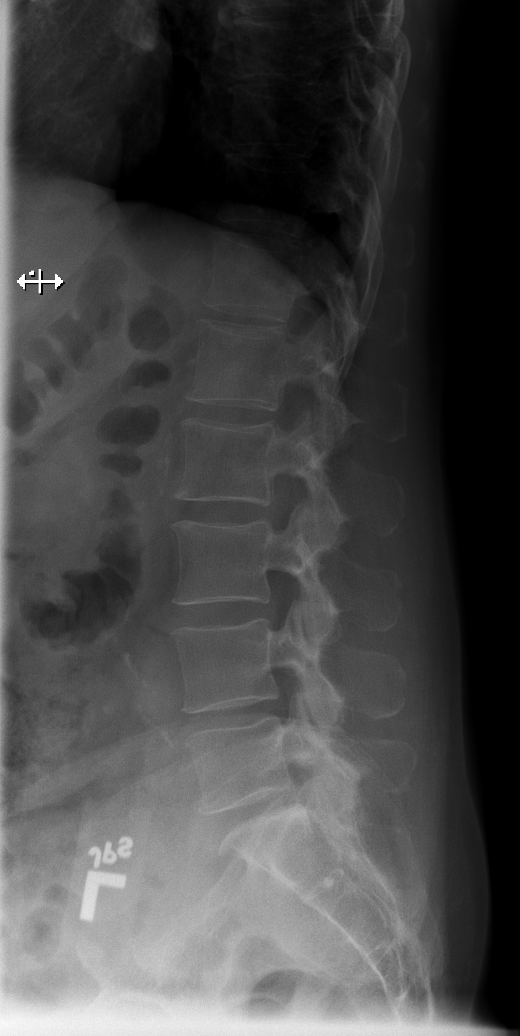

[t lumbar l-5 s-1 spot]
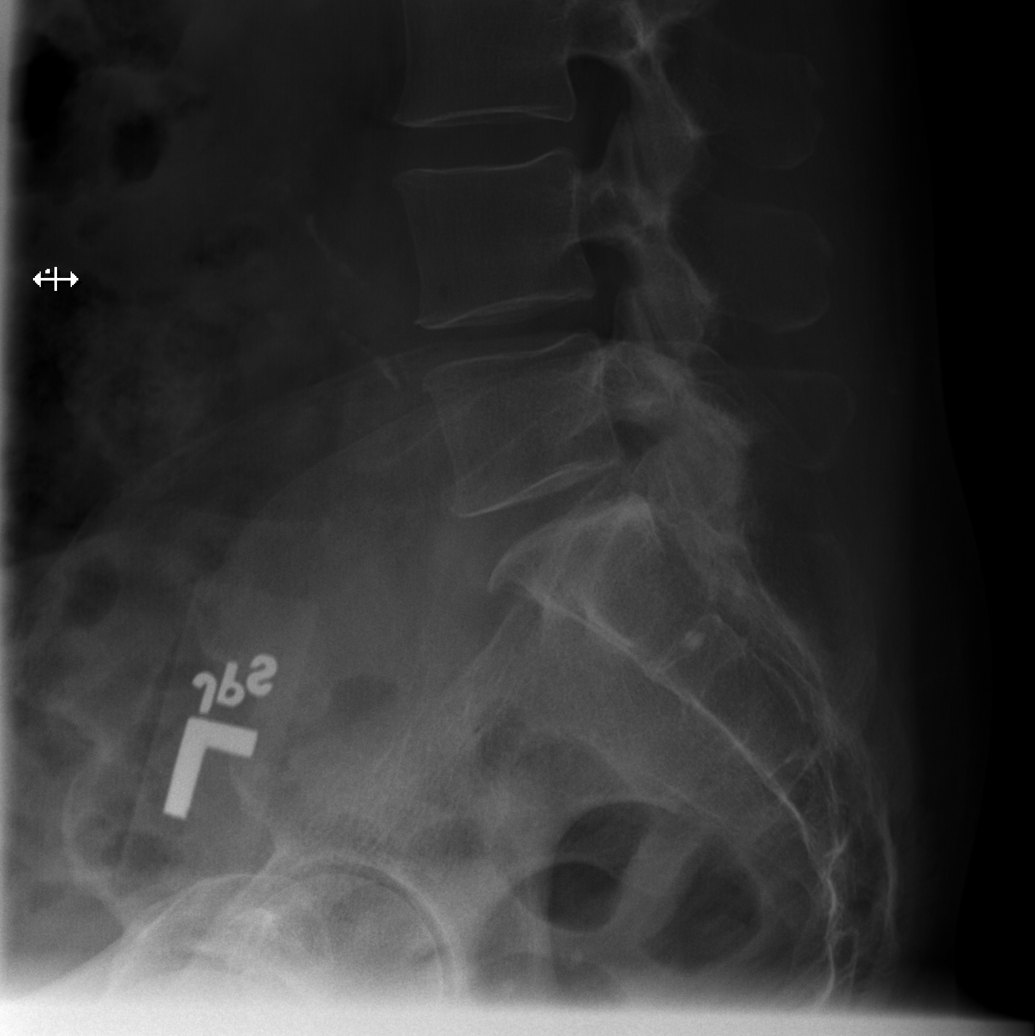

[5 of 5 positions shown; findings below may reference images not displayed]

FINDINGS: Mild scoliosis lumbar spine convex right. No compression fracture or
pars defect.

Minimal L4-5 and L5-S1 disc space narrowing.

Vascular calcifications.
IMPRESSION: 1. Mild scoliosis lumbar spine convex right. No compression fracture
or pars defect.
2. Minimal L4-5 and L5-S1 disc space narrowing.
3.  Aortic Atherosclerosis ([id number]).

## 2020-07-17 ENCOUNTER — Other Ambulatory Visit: Payer: Self-pay | Admitting: Family Medicine

## 2020-07-17 MED ORDER — SENNA 8.6 MG PO TABS
1.0000 | ORAL_TABLET | Freq: Every day | ORAL | 0 refills | Status: DC | PRN
Start: 2020-07-17 — End: 2020-11-22

## 2020-07-17 MED ORDER — TRIAMCINOLONE ACETONIDE 0.1 % EX CREA: TOPICAL_CREAM | CUTANEOUS | 2 refills | Status: DC

## 2020-07-17 MED ORDER — FAMOTIDINE 20 MG PO TABS: 20.0000 mg | ORAL_TABLET | Freq: Two times a day (BID) | ORAL | 6 refills | Status: DC

## 2020-07-20 ENCOUNTER — Ambulatory Visit: Payer: Medicaid Other

## 2020-07-27 ENCOUNTER — Other Ambulatory Visit: Payer: Self-pay | Admitting: Family Medicine

## 2020-07-27 DIAGNOSIS — G90521 Complex regional pain syndrome I of right lower limb: Secondary | ICD-10-CM

## 2020-08-04 ENCOUNTER — Ambulatory Visit (INDEPENDENT_AMBULATORY_CARE_PROVIDER_SITE_OTHER): Payer: Medicaid Other | Admitting: Family Medicine

## 2020-08-04 ENCOUNTER — Other Ambulatory Visit: Payer: Self-pay

## 2020-08-04 ENCOUNTER — Encounter: Payer: Self-pay | Admitting: Family Medicine

## 2020-08-04 DIAGNOSIS — H6123 Impacted cerumen, bilateral: Secondary | ICD-10-CM | POA: Diagnosis not present

## 2020-08-04 DIAGNOSIS — Z713 Dietary counseling and surveillance: Secondary | ICD-10-CM

## 2020-08-04 DIAGNOSIS — H6120 Impacted cerumen, unspecified ear: Secondary | ICD-10-CM | POA: Insufficient documentation

## 2020-08-04 MED ORDER — DEBROX 6.5 % OT SOLN
5.0000 [drp] | Freq: Two times a day (BID) | OTIC | 0 refills | Status: DC
Start: 2020-08-04 — End: 2022-08-05

## 2020-08-04 NOTE — Assessment & Plan Note (Signed)
She was praised for lifestyle changes unsuccessful loss of 4 pounds.  She is encouraged to continue with these lifestyle changes and to follow-up with our nutritionist.  Dr. Jenne Campus will be out of the office for at least the next month and she was encouraged to follow-up with Dr. Jenne Campus is returned.

## 2020-08-04 NOTE — Assessment & Plan Note (Signed)
She reports that she can still hear well and does not appear to have impacted cerumen although does have significant cerumen production.  Some cerumen was removed with an ear curette today.  She was encouraged to use Debrox drops daily for at least the next week.

## 2020-08-04 NOTE — Progress Notes (Signed)
    SUBJECTIVE:   CHIEF COMPLAINT / HPI:   Increased cerumen production She notes that she believes she has been having some increased earwax production.  Does not seem to be in one ear more than another.  Her hearing does not seem to be significantly affected.  She wants to know what can be done to help this issue.  Nutrition, weight loss She has seen our nutritionist in the past month and has made significant nutrition changes.  She reports that she has thrown away all of her white rice and plain bread.  She feels that she has made great strides in her nutrition believes that she has lost some weight.  Chart review shows that she has lost 4 pounds in the past month.  PERTINENT  PMH / PSH: Prediabetes  OBJECTIVE:   BP 120/80   Pulse 84   Ht 5\' 7"  (1.702 m)   Wt 161 lb (73 kg)   SpO2 98%   BMI 25.22 kg/m    General: Alert and cooperative and appears to be in no acute distress HEENT: Neck non-tender without lymphadenopathy, masses or thyromegaly Cardio: Normal S1 and S2, no S3 or S4. Rhythm is regular. No murmurs or rubs.   Pulm: Clear to auscultation bilaterally, no crackles, wheezing, or diminished breath sounds. Normal respiratory effort Extremities: No peripheral edema. Warm/ well perfused.  Strong radial pulse.   ASSESSMENT/PLAN:   Nutritional counseling She was praised for lifestyle changes unsuccessful loss of 4 pounds.  She is encouraged to continue with these lifestyle changes and to follow-up with our nutritionist.  Dr. Jenne Campus will be out of the office for at least the next month and she was encouraged to follow-up with Dr. Jenne Campus is returned.  Excessive cerumen in ear canal She reports that she can still hear well and does not appear to have impacted cerumen although does have significant cerumen production.  Some cerumen was removed with an ear curette today.  She was encouraged to use Debrox drops daily for at least the next week.     Matilde Haymaker, MD [place]

## 2020-08-04 NOTE — Patient Instructions (Signed)
Weight loss: Engineer, manufacturing job.  It looks like you have lost 4 pounds since her last visit about a month ago.  Keep up the good work with your nutrition and exercise.  It looks like our nutritionist will be out of the office for at least the next month.  You may have to wait to schedule your next appointment.  I recommend that you call and leave a message at her number.  Increased cerumen production: He did have some earwax but it does not seem to be a totally clogging of your ear right now.  For now, I recommend that he use the eardrops that we talked about at least once a day for the next week.

## 2020-08-17 ENCOUNTER — Telehealth: Payer: Self-pay

## 2020-08-17 NOTE — Telephone Encounter (Signed)
Patient calls nurse screaming, "Why haven't you filled out my paperwork to get my medicine." I have personally not received any PA on any recent medications. I looked in RN box and saw a PA for oxycodone. After looking closely we did not prescribe this medication. Therefore, I can not do the PA. I contacted the pharmacy to alert them of this. .  The patient has been updated.

## 2020-08-31 ENCOUNTER — Ambulatory Visit: Payer: Medicaid Other

## 2020-10-18 NOTE — Progress Notes (Signed)
Nutr Encounter I met with Rhonda Dominguez (MRN [id number]) on 10/19/2020 in the [facility] clinic, along with Iver Nestle, PhD, RD, LDN.   Appt start time: 1000 end time: 1032 (60mn)  Reason for visit: Prediabetes   Relevant history/background: Prediabetes (diagnosed in September 2020, most recent A1C 5.7), Hypercalcemia w/ elevated PTH, hypertension, former smoker, chronic pain  Assessment:  Usual eating pattern: Usually eating two meals per day (breakfast and either lunch or dinner). Frequent foods and beverages: juices Usual physical activity: walking around parking lot. Can breath better with exertion compared to previous visit Sleep: Patient estimates average of 5-6 hours of sleep/night, will nap 4-5 hours during the day.   24-hr recall (Up at  10AM) B ( 10:30AM)-  1 boiled egg, small bowl of grits Snk ( AM)-  Full c. Early bright juice. L ( PM)-  3-4 bottles water throughout day. Small c. cranberry juice.   Snk ( PM)-  Full c. Early bright juice. D ( 4/5PM)-  Small fry, burger, vanilla shake from [employer] Snk ( PM)-  none Typical day? Yes.     Plan:  Mailed patient information on diabetes diet recommendations (as seen in patient instructions). Focused on recommendations to get 3 meals per day with low carb/sugar content, add vegetables and protein to meals and to decrease the amount of sugary drinks consuming per day. Counseled patient on some strategies to avoid binging at holidays. Follow-up: in person after Christmas. 0900 on January 20th.  Discuss consequences of advancing to diabetes.  CRicharda OsmondPGY-3 FM resident

## 2020-10-19 ENCOUNTER — Telehealth (INDEPENDENT_AMBULATORY_CARE_PROVIDER_SITE_OTHER): Payer: Medicaid Other | Admitting: Student in an Organized Health Care Education/Training Program

## 2020-10-19 ENCOUNTER — Other Ambulatory Visit: Payer: Self-pay

## 2020-10-19 DIAGNOSIS — R7303 Prediabetes: Secondary | ICD-10-CM | POA: Diagnosis present

## 2020-10-19 NOTE — Patient Instructions (Addendum)
Follow up appointment is in person at [place] clinic. January 20th, 9:00am   Diet Recommendations for Diabetes  Carbohydrates includes starchy and sugary foods, which raise blood sugar. Starchy (carb) foods examples: Bread, rice, pasta, potatoes, corn, cereal, grits, crackers, bagels, muffins, all baked goods.  (Fruit, milk, and yogurt also have carbohydrate, but most of these foods will not spike your blood sugar as most starchy or sweet foods will.)    1. Eat at least 3 REAL meals and 1-2 snacks per day. 2. Limit starchy foods to TWO per meal.   Examples of ONE portion of a starchy food:  - ONE slice of bread (or its equivalent, such as half of a hamburger bun).   - 1/2 cup of a "scoopable" starchy food such as potatoes, grits or rice.   - 15 grams of Total Carbohydrate as shown on food label for things like crackers or chips.   -  4 ounces of a sweet drink (including fruit juice*). * fruit juice is a concentrated source of sugar and calories without the benefit of fiber like actual fruit.  3. At every lunch and dinner include: a protein food, a carb food, and vegetables. Protein examples: Meat, fish, poultry, eggs, dairy foods, and beans such as pinto and kidney beans (beans also provide carbohydrate).   Tip: Keep frozen vegetables on hand for a quick option.       An example of a balanced diet plan that has been shown to help with blood sugar control:  DASH Eating Plan DASH stands for "Dietary Approaches to Stop Hypertension." The DASH eating plan is a healthy eating plan that has been shown to reduce high blood pressure (hypertension). It may also reduce your risk for type 2 diabetes, heart disease, and stroke. The DASH eating plan may also help with weight loss. What are tips for following this plan?  General guidelines  Avoid eating more than 2,300 mg (milligrams) of salt (sodium) a day. If you have hypertension, you may need to reduce your sodium intake to 1,500  mg a day.  Limit alcohol intake to no more than 1 drink a day for nonpregnant women and 2 drinks a day for men. One drink equals 12 oz of beer, 5 oz of wine, or 1 oz of hard liquor.  Work with your health care provider to maintain a healthy body weight or to lose weight. Ask what an ideal weight is for you.  Get at least 30 minutes of exercise that causes your heart to beat faster (aerobic exercise) most days of the week. Activities may include walking, swimming, or biking.  Work with your health care provider or diet and nutrition specialist (dietitian) to adjust your eating plan to your individual calorie needs. Reading food labels   Check food labels for the amount of sodium per serving. Choose foods with less than 5 percent of the Daily Value of sodium. Generally, foods with less than 300 mg of sodium per serving fit into this eating plan.  To find whole grains, look for the word "whole" as the first word in the ingredient list. Shopping  Buy products labeled as "low-sodium" or "no salt added."  Buy fresh foods. Avoid canned foods and premade or frozen meals. Cooking  Avoid adding salt when cooking. Use salt-free seasonings or herbs instead of table salt or sea salt. Check with your health care provider or pharmacist before using salt substitutes.  Do not fry foods. Cook foods using healthy methods  such as baking, boiling, grilling, and broiling instead.  Cook with heart-healthy oils, such as olive, canola, soybean, or sunflower oil. Meal planning  Eat a balanced diet that includes: ? 5 or more servings of fruits and vegetables each day. At each meal, try to fill half of your plate with fruits and vegetables. ? Up to 6-8 servings of whole grains each day. ? Less than 6 oz of lean meat, poultry, or fish each day. A 3-oz serving of meat is about the same size as a deck of cards. One egg equals 1 oz. ? 2 servings of low-fat dairy each day. ? A serving of nuts, seeds, or beans 5  times each week. ? Heart-healthy fats. Healthy fats called Omega-3 fatty acids are found in foods such as flaxseeds and coldwater fish, like sardines, salmon, and mackerel.  Limit how much you eat of the following: ? Canned or prepackaged foods. ? Food that is high in trans fat, such as fried foods. ? Food that is high in saturated fat, such as fatty meat. ? Sweets, desserts, sugary drinks, and other foods with added sugar. ? Full-fat dairy products.  Do not salt foods before eating.  Try to eat at least 2 vegetarian meals each week.  Eat more home-cooked food and less restaurant, buffet, and fast food.  When eating at a restaurant, ask that your food be prepared with less salt or no salt, if possible. What foods are recommended? The items listed may not be a complete list. Talk with your dietitian about what dietary choices are best for you. Grains Whole-grain or whole-wheat bread. Whole-grain or whole-wheat pasta. Brown rice. [name]. Bulgur. Whole-grain and low-sodium cereals. Pita bread. Low-fat, low-sodium crackers. Whole-wheat flour tortillas. Vegetables Fresh or frozen vegetables (raw, steamed, roasted, or grilled). Low-sodium or reduced-sodium tomato and vegetable juice. Low-sodium or reduced-sodium tomato sauce and tomato paste. Low-sodium or reduced-sodium canned vegetables. Fruits All fresh, dried, or frozen fruit. Canned fruit in natural juice (without added sugar). Meat and other protein foods Skinless chicken or [place]. Ground chicken or [place]. Pork with fat trimmed off. Fish and seafood. Egg whites. Dried beans, peas, or lentils. Unsalted nuts, nut butters, and seeds. Unsalted canned beans. Lean cuts of beef with fat trimmed off. Low-sodium, lean deli meat. Dairy Low-fat (1%) or fat-free (skim) milk. Fat-free, low-fat, or reduced-fat cheeses. Nonfat, low-sodium ricotta or cottage cheese. Low-fat or nonfat yogurt. Low-fat, low-sodium cheese. Fats and oils Soft  margarine without trans fats. Vegetable oil. Low-fat, reduced-fat, or light mayonnaise and salad dressings (reduced-sodium). Canola, safflower, olive, soybean, and sunflower oils. Avocado. Seasoning and other foods Herbs. Spices. Seasoning mixes without salt. Unsalted popcorn and pretzels. Fat-free sweets. What foods are not recommended? The items listed may not be a complete list. Talk with your dietitian about what dietary choices are best for you. Grains Baked goods made with fat, such as croissants, muffins, or some breads. Dry pasta or rice meal packs. Vegetables Creamed or fried vegetables. Vegetables in a cheese sauce. Regular canned vegetables (not low-sodium or reduced-sodium). Regular canned tomato sauce and paste (not low-sodium or reduced-sodium). Regular tomato and vegetable juice (not low-sodium or reduced-sodium). Angie Fava. Olives. Fruits Canned fruit in a light or heavy syrup. Fried fruit. Fruit in cream or butter sauce. Meat and other protein foods Fatty cuts of meat. Ribs. Fried meat. Berniece Salines. Sausage. Bologna and other processed lunch meats. Salami. Fatback. Hotdogs. Bratwurst. Salted nuts and seeds. Canned beans with added salt. Canned or smoked fish. Whole eggs or  egg yolks. Chicken or [place] with skin. Dairy Whole or 2% milk, cream, and half-and-half. Whole or full-fat cream cheese. Whole-fat or sweetened yogurt. Full-fat cheese. Nondairy creamers. Whipped toppings. Processed cheese and cheese spreads. Fats and oils Butter. Stick margarine. Lard. Shortening. Ghee. Bacon fat. Tropical oils, such as coconut, palm kernel, or palm oil. Seasoning and other foods Salted popcorn and pretzels. Onion salt, garlic salt, seasoned salt, table salt, and sea salt. Worcestershire sauce. Tartar sauce. Barbecue sauce. Teriyaki sauce. Soy sauce, including reduced-sodium. Steak sauce. Canned and packaged gravies. Fish sauce. Oyster sauce. Cocktail sauce. Horseradish that you find on the shelf.  Ketchup. Mustard. Meat flavorings and tenderizers. Bouillon cubes. Hot sauce and Tabasco sauce. Premade or packaged marinades. Premade or packaged taco seasonings. Relishes. Regular salad dressings. Where to find more information:  National Heart, Lung, and [place]: https://wilson-eaton.com/  American Heart Association: www.heart.org Summary  The DASH eating plan is a healthy eating plan that has been shown to reduce high blood pressure (hypertension). It may also reduce your risk for type 2 diabetes, heart disease, and stroke.  With the DASH eating plan, you should limit salt (sodium) intake to 2,300 mg a day. If you have hypertension, you may need to reduce your sodium intake to 1,500 mg a day.  When on the DASH eating plan, aim to eat more fresh fruits and vegetables, whole grains, lean proteins, low-fat dairy, and heart-healthy fats.  Work with your health care provider or diet and nutrition specialist (dietitian) to adjust your eating plan to your individual calorie needs. This information is not intended to replace advice given to you by your health care provider. Make sure you discuss any questions you have with your health care provider. Document Revised: 11/07/2017 Document Reviewed: 11/18/2016 Elsevier Patient Education  2020 [employer].

## 2020-11-16 ENCOUNTER — Telehealth: Payer: Self-pay | Admitting: Pharmacist

## 2020-11-16 NOTE — Telephone Encounter (Signed)
Contacted patient to reassess tobacco cessation status.   Patient reports continued abstinence (now 21 months).   She states her "breathing is good".  She continues to self-isolate due to Covid.  She shared that she is moving to a new section 8 housing unit with 2 bedrooms.  She was excited to move from her apartment with the "leaking roof".     I congratulated her ton her continued abstinence.  She verbalized commitment to remain smoke free.   She was appreciative of the call.

## 2020-11-22 ENCOUNTER — Other Ambulatory Visit: Payer: Self-pay

## 2020-11-22 MED ORDER — SENNA 8.6 MG PO TABS: 1.0000 | ORAL_TABLET | Freq: Every day | ORAL | 0 refills | Status: DC | PRN

## 2020-12-15 ENCOUNTER — Other Ambulatory Visit: Payer: Self-pay | Admitting: Family Medicine

## 2020-12-18 ENCOUNTER — Telehealth: Payer: Self-pay | Admitting: Pharmacist

## 2020-12-18 MED ORDER — ALBUTEROL SULFATE HFA 108 (90 BASE) MCG/ACT IN AERS: 2.0000 | INHALATION_SPRAY | Freq: Four times a day (QID) | RESPIRATORY_TRACT | 1 refills | Status: DC | PRN

## 2020-12-18 NOTE — Telephone Encounter (Signed)
Noted and agree. 

## 2020-12-18 NOTE — Telephone Encounter (Signed)
Call from patient who reports worsening of breathing which she believes is due to mold in her apartment.   She denies smoking, reports quitting for >1 year.   She reports her breathing is worse recently and she would like to have her "puffer" to improve her breathing.  Upon review of current medications, patient reports she has never taken [employer].  She desires albuterol which she says is the inhaler she believes works the best.   I agreed to provide refill for albuterol.  She thanked me for the help.  New prescription for albuterol with 1 refill sent to her pharmacy.

## 2020-12-28 ENCOUNTER — Ambulatory Visit: Payer: Medicaid Other | Admitting: Family Medicine

## 2020-12-29 ENCOUNTER — Other Ambulatory Visit: Payer: Self-pay | Admitting: Family Medicine

## 2020-12-29 DIAGNOSIS — R21 Rash and other nonspecific skin eruption: Secondary | ICD-10-CM

## 2021-01-15 ENCOUNTER — Other Ambulatory Visit: Payer: Self-pay | Admitting: Family Medicine

## 2021-01-15 ENCOUNTER — Telehealth: Payer: Self-pay | Admitting: Family Medicine

## 2021-01-15 DIAGNOSIS — G90521 Complex regional pain syndrome I of right lower limb: Secondary | ICD-10-CM

## 2021-01-15 MED ORDER — GABAPENTIN 300 MG PO CAPS: 300.0000 mg | ORAL_CAPSULE | Freq: Three times a day (TID) | ORAL | 1 refills | Status: DC | PRN

## 2021-01-15 NOTE — Telephone Encounter (Signed)
Patient is calling needing a refill on her gabapentin. Please advise. Thanks!

## 2021-01-15 NOTE — Telephone Encounter (Signed)
Rx refilled.

## 2021-01-17 ENCOUNTER — Other Ambulatory Visit: Payer: Self-pay | Admitting: Family Medicine

## 2021-01-17 DIAGNOSIS — Z1231 Encounter for screening mammogram for malignant neoplasm of breast: Secondary | ICD-10-CM

## 2021-01-18 ENCOUNTER — Telehealth (INDEPENDENT_AMBULATORY_CARE_PROVIDER_SITE_OTHER): Payer: Medicaid Other | Admitting: Family Medicine

## 2021-01-18 DIAGNOSIS — I10 Essential (primary) hypertension: Secondary | ICD-10-CM

## 2021-01-18 DIAGNOSIS — R7303 Prediabetes: Secondary | ICD-10-CM | POA: Diagnosis not present

## 2021-01-18 DIAGNOSIS — J449 Chronic obstructive pulmonary disease, unspecified: Secondary | ICD-10-CM

## 2021-01-18 NOTE — Patient Instructions (Signed)
1. Eat at least 1+ vegetable and 1+ fruit servings per day. 2. Limit carbohydrate to 1 serving per meal. Carbohydrate includes starch and sugar.  Sugar and starch raise blood sugar. These foods include bread, buns, rice (white and brown), pasta, noodles, potatoes, [place] fries, corn, cereal, grits, crackers, bagels, muffins, desserts, candy, and all baked goods.   One "serving" of carbohydrates is:     a. 1/3 glass of juice      b. 1 serving of "scoopable" foods (rice, mashed potatoes, cereal, Mac'n'cheese)     c. 15g of Carbohydrate as seen on a food label     d. 1 slice of bread or half of a hamburger or hotdog bun   Document progress by writing it down.  Follow up: patient will call

## 2021-01-18 NOTE — Progress Notes (Signed)
Telehealth Encounter I connected with Rhonda Dominguez (MRN [id number]) on 01/18/2021 by MyChart, along with Iver Nestle, PhD, RD, LDN. I verified that I am speaking with the correct person using two identifiers, and that the patient was in a private environment conducive to confidentiality. The patient agreed to proceed.  Persons participating in visit were patient; Alease Medina, PhD, RD, LDN; and provider Daisy Floro. Provider and Dr. Jenne Campus were located at [place] during this telehealth encounter; patient was at home.  Appt start time: 0930 end time: 1000 (30 minutes)  Reason for telehealth visit: Prediabetes   Relevant history/background: Prediabetes (diagnosed in September 2020, most recent A1c 5.7%), Hypercalcemia w/ elevated PTH, hypertension, former smoker, chronic pain Patient recently had cataract surgery (January 2022), still healing from that  Assessment:  Usual eating pattern: 2 meals and occasional snacks (fruit) Frequent foods and beverages: Water, juices Usual physical activity: Wwalking around parking lot. Can breath better with exertion compared to previous visit  24-hr recall:  (Up at 4:30am AM) B (7:00 AM)- 4x cups cereal, bowl of cheerios, 2% milk 1 glass of milk, filled each cereal bowl up) Snk (AM) - N/A   L (15:30 PM) - 2 sausage dogs with bun and fries, Water to drink Snk (PM) - N/A D (PM) - N/A  Snk (7pm PM) - Bright and Early Juice, 1 glass  Typical day? Yes.    Intervention: Completed diet and exercise history, and established: - SMART behavioral goals (Smart Goals: Specific, Measurable, Action-oriented, Realistic, Time-based.)   - Method of documenting progress: Patient will document by writing it down 1. Eat at least 1+ vegetable and 1+ fruit servings per day. 2. Limit carbohydrate to 1 serving per meal. Carbohydrate includes starch and sugar.  Sugar and starch raise blood sugar. These foods include bread, buns, rice (white  and brown), pasta, noodles, potatoes, [place] fries, corn, cereal, grits, crackers, bagels, muffins, desserts, candy, and all baked goods.   One "serving" of carbohydrates is:     a. 1/3 glass of juice      b. 1 serving of "scoopable" foods (rice, mashed potatoes, cereal, Mac'n'cheese)     c. 15g of Carbohydrate as seen on a food label     d. 1 slice of bread or half of a hamburger or hotdog bun   For recommendations and goals, see Patient Instructions.    Follow-up: patient will call for an appointment (patient in process of moving, ?April 30th)  Daisy Floro PGY-3 FM resident

## 2021-01-24 NOTE — Progress Notes (Addendum)
ADDENDUM:  [place] Telemedicine Visit  Patient consented to have virtual visit and was identified by name and date of birth. Method of visit: MyChart Video was attempted but was interrupted: <50% of visit completed via video. The remainder of the encounter was conducted via clinic-to-home telephone.   Encounter participants: Patient: Rhonda Dominguez - located at Home Provider: Daisy Floro, Dr. Iver Nestle - located at [place], [place], PGY-3 01/24/2021 11:36 AM

## 2021-03-05 ENCOUNTER — Other Ambulatory Visit: Payer: Self-pay | Admitting: Family Medicine

## 2021-03-05 DIAGNOSIS — I10 Essential (primary) hypertension: Secondary | ICD-10-CM

## 2021-03-06 ENCOUNTER — Telehealth: Payer: Self-pay | Admitting: Pharmacist

## 2021-03-06 DIAGNOSIS — Z87891 Personal history of nicotine dependence: Secondary | ICD-10-CM

## 2021-03-06 NOTE — Telephone Encounter (Signed)
Phone Call follow-up of patient who quit smoking two years ago (02/2019).   She denies any smoking or sources of relapse.  She continues to minimize social trips/interactions due to fear of Covid.  She states "I am trying to be safe".   She was excited to share that she is moving at the end of April (in 1 month) to a brand new "2-bedroom condo".   She continues to be an excellent candidate for long-term tobacco avoidance/cessation.   I plan to follow-up by phone in 3-6 months for continued assessment and support.

## 2021-03-06 NOTE — Assessment & Plan Note (Signed)
Phone Call follow-up of patient who quit smoking two years ago (02/2019).   She denies any smoking or sources of relapse.  She continues to minimize social trips/interactions due to fear of Covid.  She states "I am trying to be safe".   She was excited to share that she is moving at the end of April (in 1 month) to a brand new "2-bedroom condo".   She continues to be an excellent candidate for long-term tobacco avoidance/cessation.

## 2021-03-06 NOTE — Telephone Encounter (Signed)
Noted and agree. 

## 2021-03-06 NOTE — Telephone Encounter (Signed)
-----   Message from Leavy Cella, [place] sent at 11/16/2020 10:20 AM EST ----- Regarding: 2 years quit

## 2021-03-08 ENCOUNTER — Ambulatory Visit: Payer: Medicaid Other

## 2021-04-18 ENCOUNTER — Telehealth: Payer: Self-pay | Admitting: *Deleted

## 2021-04-18 NOTE — Telephone Encounter (Signed)
Patient called and would like to speak with Dr.Sykes about her eating plan.  Will forward to MD.  Rhonda Dominguez

## 2021-04-23 ENCOUNTER — Other Ambulatory Visit: Payer: Self-pay | Admitting: Family Medicine

## 2021-04-25 ENCOUNTER — Ambulatory Visit: Payer: Medicaid Other | Admitting: Family Medicine

## 2021-04-26 ENCOUNTER — Ambulatory Visit: Payer: Medicaid Other

## 2021-05-03 ENCOUNTER — Other Ambulatory Visit: Payer: Self-pay | Admitting: Pharmacist

## 2021-05-04 ENCOUNTER — Other Ambulatory Visit: Payer: Self-pay | Admitting: Pharmacist

## 2021-05-17 ENCOUNTER — Ambulatory Visit: Payer: Medicaid Other

## 2021-05-31 ENCOUNTER — Telehealth: Payer: Self-pay | Admitting: Pharmacist

## 2021-05-31 NOTE — Telephone Encounter (Signed)
Noted and agree. 

## 2021-05-31 NOTE — Telephone Encounter (Signed)
Patient is returning Dr. Graylin Shiver phone call. She would like for him to call her back.

## 2021-05-31 NOTE — Telephone Encounter (Signed)
Attempted to contact patient for long-term tobacco cessation follow-up.   No answer, left message I would try again in 1 week.

## 2021-05-31 NOTE — Telephone Encounter (Signed)
-----   Message from Leavy Cella, [place] sent at 03/06/2021  2:33 PM EDT ----- Regarding: Tobacco Cessation 2 years and 3 months.

## 2021-05-31 NOTE — Telephone Encounter (Signed)
Returned patient's request to call her back.   She reports continued abstinence from smoking (> 27 months).  She reports her breathing is great.   She also reported that she is moving into her new apartment TODAY.  She was very excited to be moving.   She has received covid vaccine X2 and reports 2 additional booster doses.  She is happy to report that she has remained free of any Covid infection.    She plans to follow-up in the near future.    I congratulated her on her success with quitting and shared in her her excitement of moving into a new and better place to live.  She plans to follow-up in the near future.

## 2021-06-18 ENCOUNTER — Telehealth: Payer: Self-pay

## 2021-06-18 NOTE — Telephone Encounter (Signed)
Patient calls nurse line requesting a DME order for a shower chair. Will forward to PCP.

## 2021-06-19 ENCOUNTER — Other Ambulatory Visit: Payer: Self-pay | Admitting: Family Medicine

## 2021-06-19 DIAGNOSIS — M5416 Radiculopathy, lumbar region: Secondary | ICD-10-CM

## 2021-06-22 NOTE — Telephone Encounter (Signed)
Community message has been sent to [employer] for processing.

## 2021-06-22 NOTE — Telephone Encounter (Signed)
Received confirmation from [employer].  

## 2021-07-03 ENCOUNTER — Other Ambulatory Visit: Payer: Self-pay

## 2021-07-04 ENCOUNTER — Telehealth (INDEPENDENT_AMBULATORY_CARE_PROVIDER_SITE_OTHER): Payer: Medicaid Other | Admitting: Family Medicine

## 2021-07-04 DIAGNOSIS — M8949 Other hypertrophic osteoarthropathy, multiple sites: Secondary | ICD-10-CM | POA: Diagnosis not present

## 2021-07-04 DIAGNOSIS — J449 Chronic obstructive pulmonary disease, unspecified: Secondary | ICD-10-CM | POA: Diagnosis not present

## 2021-07-04 DIAGNOSIS — M159 Polyosteoarthritis, unspecified: Secondary | ICD-10-CM

## 2021-07-04 MED ORDER — MELOXICAM 7.5 MG PO TABS: ORAL_TABLET | ORAL | 0 refills | Status: DC

## 2021-07-04 MED ORDER — PROAIR HFA 108 (90 BASE) MCG/ACT IN AERS: 2.0000 | INHALATION_SPRAY | Freq: Four times a day (QID) | RESPIRATORY_TRACT | 0 refills | Status: DC | PRN

## 2021-07-04 NOTE — Progress Notes (Signed)
[place] Telemedicine Visit  Patient consented to have virtual visit and was identified by name and date of birth. Method of visit: Telephone  Encounter participants: Patient: Rhonda Dominguez - located at home Provider: Rise Patience - located off [facility]  Chief Complaint: Joint pain  HPI:  Patient reports that she has been having worsening joint pain and needs a refill of her Meloxicam, she states that the pain is more of a burning sensation in her joints, especially her knees. She has had the medication recently and states she was previously on it and would like a refill. She ambulates with cane and walk and has a nurse present to help her with daily tasks. Denies fever, erythema or swelling of joints, weight loss, change in ambulation status (already requires assistance)  She also reports that she needs her ProAir HFA refilled today as well.    ROS: per HPI  Pertinent PMHx: Reviewed  Exam:  There were no vitals taken for this visit.  Respiratory: able to speak in full sentences, speech clear to understand and able to follow conversation and discussions appropriately.  Assessment/Plan:  COPD (chronic obstructive pulmonary disease) ([place]) Refilled patient's ProAir per request. Will need to discuss amount of inhaler use at next visit, was unable to address due to difficulty with tele-visit.   Osteoarthritis Patient requesting refill of Meloxicam. Reports that lower extremity joint pains have increased recently. Denies erythema, swelling, fevers, or other alarming symptoms. Discussed importance of obtaining labs at next visit. - Refilled Meloxicam x30 days - Patient needs BMP at next visit to monitor kidney function  Elevated calcium and PTH In 2021, patient was noted to have an elevated Vit D, calcium level, and PTH. Per the result notes, patient was supposed to have the labs rechecked in 6 months, but that did not appear to happen. Patient is  scheduled for an in-person appointment on 8/23. - F/u with repeat labs concerning calcium and PTH at in-person visit.  Time spent during visit with patient: 12 minutes

## 2021-07-04 NOTE — Assessment & Plan Note (Signed)
Patient requesting refill of Meloxicam. Reports that lower extremity joint pains have increased recently. Denies erythema, swelling, fevers, or other alarming symptoms. Discussed importance of obtaining labs at next visit. - Refilled Meloxicam x30 days - Patient needs BMP at next visit to monitor kidney function

## 2021-07-04 NOTE — Assessment & Plan Note (Signed)
Refilled patient's ProAir per request. Will need to discuss amount of inhaler use at next visit, was unable to address due to difficulty with tele-visit.

## 2021-07-05 NOTE — Telephone Encounter (Signed)
Patient has appt on 8/23. Salvatore Marvel, CMA

## 2021-07-13 ENCOUNTER — Ambulatory Visit: Payer: Medicaid Other

## 2021-07-19 ENCOUNTER — Other Ambulatory Visit: Payer: Self-pay

## 2021-07-19 DIAGNOSIS — I10 Essential (primary) hypertension: Secondary | ICD-10-CM

## 2021-07-20 MED ORDER — HYDROCHLOROTHIAZIDE 25 MG PO TABS: 25.0000 mg | ORAL_TABLET | Freq: Every day | ORAL | 0 refills | Status: DC

## 2021-07-30 NOTE — Progress Notes (Signed)
    SUBJECTIVE:   CHIEF COMPLAINT / HPI:   Hypertension: Continues on amlodipine 10 mg daily, hydrochlorothiazide 25 mg daily.  Last BMP showed normal renal function greater than 1 year ago   Constipation: Having bowel movements about once every other day but had hard stools. Not trying anything for constipation.   Osteoarthritis right hip States she has had some problems with her right hip pain for some time.  Previously went to an orthopedist that recommended a surgery which she is not inclined to do at this time.  She is interested in physical therapy.  She endorses that sometimes the pain will shoot down the back of her leg.  PERTINENT  PMH / PSH: Constipation  OBJECTIVE:   BP 118/74   Pulse 77   Ht '5\' 7"'$  (1.702 m)   Wt 155 lb 14.2 oz (70.7 kg)   SpO2 100%   BMI 24.42 kg/m    General: NAD, pleasant, able to participate in exam Cardiac: RRR, no murmurs. Respiratory: CTAB, normal effort, No wheezes, rales or rhonchi Abdomen: Bowel sounds present, nontender, nondistended, no hepatosplenomegaly. MSK: Positive straight leg test on the right.  No pain with palpation of the midline spine or in the musculature lateral to the midline spine. Neuro: alert, no obvious focal deficits Psych: Normal affect, speech somewhat scattered but able to be redirected  ASSESSMENT/PLAN:   Essential hypertension Continue on amlodipine 10 mg daily and hydrochlorothiazide 25 mg daily.  We will check a BMP today to monitor renal function since has been greater than 1 year since she has had 1.  Osteoarthritis Positive straight leg raise test on the right.  We will place referral for physical therapy at this time.  Continue Voltaren gel.   Screening for hyperlipidemia: We will check lipid panel  Encounter for Pap smear: Recommend pap smear but patient refused.  She states she will follow-up in a few months for this.  Constipation: Placed refill for senna as well as MiraLAX.  Lurline Del,  [place]

## 2021-07-31 ENCOUNTER — Encounter: Payer: Self-pay | Admitting: Family Medicine

## 2021-07-31 ENCOUNTER — Other Ambulatory Visit: Payer: Self-pay

## 2021-07-31 ENCOUNTER — Ambulatory Visit (INDEPENDENT_AMBULATORY_CARE_PROVIDER_SITE_OTHER): Payer: Medicaid Other | Admitting: Family Medicine

## 2021-07-31 VITALS — BP 118/74 | HR 77 | Ht 67.0 in | Wt 155.9 lb

## 2021-07-31 DIAGNOSIS — Z114 Encounter for screening for human immunodeficiency virus [HIV]: Secondary | ICD-10-CM | POA: Diagnosis not present

## 2021-07-31 DIAGNOSIS — I10 Essential (primary) hypertension: Secondary | ICD-10-CM

## 2021-07-31 DIAGNOSIS — R21 Rash and other nonspecific skin eruption: Secondary | ICD-10-CM | POA: Diagnosis not present

## 2021-07-31 DIAGNOSIS — Z13220 Encounter for screening for lipoid disorders: Secondary | ICD-10-CM | POA: Diagnosis present

## 2021-07-31 DIAGNOSIS — M159 Polyosteoarthritis, unspecified: Secondary | ICD-10-CM

## 2021-07-31 DIAGNOSIS — M8949 Other hypertrophic osteoarthropathy, multiple sites: Secondary | ICD-10-CM | POA: Diagnosis not present

## 2021-07-31 MED ORDER — SENNA 8.6 MG PO TABS
1.0000 | ORAL_TABLET | Freq: Every day | ORAL | 0 refills | Status: DC | PRN
Start: 2021-07-31 — End: 2021-12-12

## 2021-07-31 MED ORDER — TRIAMCINOLONE ACETONIDE 0.1 % EX CREA: TOPICAL_CREAM | CUTANEOUS | 2 refills | Status: DC

## 2021-07-31 MED ORDER — POLYETHYLENE GLYCOL 3350 17 GM/SCOOP PO POWD: 17.0000 g | Freq: Two times a day (BID) | ORAL | 1 refills | Status: DC | PRN

## 2021-07-31 MED ORDER — HYDROXYZINE HCL 25 MG PO TABS: ORAL_TABLET | ORAL | 2 refills | Status: DC

## 2021-07-31 NOTE — Assessment & Plan Note (Signed)
Continue on amlodipine 10 mg daily and hydrochlorothiazide 25 mg daily.  We will check a BMP today to monitor renal function since has been greater than 1 year since she has had 1.

## 2021-07-31 NOTE — Patient Instructions (Addendum)
We are checking some lab work today including your cholesterol levels and kidney function/electrolytes.  I will let you know the results when they return.  We also can screen for HIV as you requested  I have sent in a referral for physical therapy for your chronic right hip pain

## 2021-07-31 NOTE — Assessment & Plan Note (Signed)
Positive straight leg raise test on the right.  We will place referral for physical therapy at this time.  Continue Voltaren gel.

## 2021-08-01 LAB — BASIC METABOLIC PANEL
BUN/Creatinine Ratio: 14 (ref 12–28)
BUN: 14 mg/dL (ref 8–27)
CO2: 21 mmol/L (ref 20–29)
Calcium: 10.7 mg/dL — ABNORMAL HIGH (ref 8.7–10.3)
Chloride: 102 mmol/L (ref 96–106)
Creatinine, Ser: 1.01 mg/dL — ABNORMAL HIGH (ref 0.57–1.00)
Glucose: 99 mg/dL (ref 65–99)
Potassium: 4.1 mmol/L (ref 3.5–5.2)
Sodium: 142 mmol/L (ref 134–144)
eGFR: 63 mL/min/{1.73_m2} (ref >59)

## 2021-08-01 LAB — LIPID PANEL
Chol/HDL Ratio: 2.3 ratio (ref 0.0–4.4)
Cholesterol, Total: 169 mg/dL (ref 100–199)
HDL: 73 mg/dL (ref >39)
LDL Chol Calc (NIH): 84 mg/dL (ref 0–99)
Triglycerides: 64 mg/dL (ref 0–149)
VLDL Cholesterol Cal: 12 mg/dL (ref 5–40)

## 2021-08-01 LAB — HIV ANTIBODY (ROUTINE TESTING W REFLEX): HIV Screen 4th Generation wRfx: NONREACTIVE

## 2021-08-02 ENCOUNTER — Ambulatory Visit: Payer: Medicaid Other

## 2021-08-06 ENCOUNTER — Other Ambulatory Visit: Payer: Self-pay | Admitting: Family Medicine

## 2021-08-06 NOTE — Progress Notes (Signed)
Called patient discussed her lab results.  It did show hypercalcemia which she has had in the past.  Noted that her last check was normal with regard to calcium, however in the past the plan was to check a PTH and it does not appear this has been done.  Called patient discussed this with her and recommended that she come in for a lab only appointment to check a PTH.  I have placed this order.  Patient plans to call the clinic and make a lab visit at her convenience.

## 2021-08-10 ENCOUNTER — Other Ambulatory Visit: Payer: Medicaid Other

## 2021-08-10 ENCOUNTER — Other Ambulatory Visit: Payer: Self-pay

## 2021-08-11 LAB — PTH, INTACT AND CALCIUM
Calcium: 9.9 mg/dL (ref 8.7–10.3)
PTH: 54 pg/mL (ref 15–65)

## 2021-08-15 ENCOUNTER — Ambulatory Visit: Payer: Medicaid Other | Admitting: Podiatry

## 2021-08-15 ENCOUNTER — Telehealth: Payer: Self-pay

## 2021-08-15 NOTE — Telephone Encounter (Signed)
Patient calls nurse line regarding missed phone call from provider. Patient had recent lab work on 9/2. Please advise if this was regarding results.   I am happy to give patient a detailed message regarding lab results.   Thanks,   Talbot Grumbling, RN

## 2021-08-16 NOTE — Telephone Encounter (Signed)
Patient called and informed.   Talbot Grumbling, RN

## 2021-08-28 ENCOUNTER — Other Ambulatory Visit: Payer: Self-pay

## 2021-08-28 ENCOUNTER — Ambulatory Visit: Payer: Medicaid Other | Attending: Family Medicine | Admitting: Physical Therapy

## 2021-08-28 ENCOUNTER — Encounter: Payer: Self-pay | Admitting: Physical Therapy

## 2021-08-28 DIAGNOSIS — G90521 Complex regional pain syndrome I of right lower limb: Secondary | ICD-10-CM

## 2021-08-28 DIAGNOSIS — M6281 Muscle weakness (generalized): Secondary | ICD-10-CM | POA: Insufficient documentation

## 2021-08-28 DIAGNOSIS — G8929 Other chronic pain: Secondary | ICD-10-CM | POA: Diagnosis present

## 2021-08-28 DIAGNOSIS — R2689 Other abnormalities of gait and mobility: Secondary | ICD-10-CM | POA: Diagnosis present

## 2021-08-28 DIAGNOSIS — M25561 Pain in right knee: Secondary | ICD-10-CM | POA: Diagnosis present

## 2021-08-28 DIAGNOSIS — M25571 Pain in right ankle and joints of right foot: Secondary | ICD-10-CM | POA: Insufficient documentation

## 2021-08-28 DIAGNOSIS — M25551 Pain in right hip: Secondary | ICD-10-CM | POA: Diagnosis present

## 2021-08-28 MED ORDER — GABAPENTIN 300 MG PO CAPS: 300.0000 mg | ORAL_CAPSULE | Freq: Three times a day (TID) | ORAL | 1 refills | Status: DC | PRN

## 2021-08-28 NOTE — Therapy (Signed)
[place], [place], [address] Phone: [phone]   Fax:  [phone]  Physical Therapy Evaluation  Patient Details  Name: Rhonda Dominguez MRN: [id number] Date of Birth: 1961/03/02 Referring Provider (PT): Lenoria Chime, MD   Encounter Date: 08/28/2021   PT End of Session - 08/28/21 1009     Visit Number 1    Number of Visits 9    Date for PT Re-Evaluation 11/20/21    Authorization Type [place] MCD    PT Start Time 0930    PT Stop Time 1005    PT Time Calculation (min) 35 min    Activity Tolerance Patient tolerated treatment well    Behavior During Therapy [facility] for tasks assessed/performed;Impulsive             Past Medical History:  Diagnosis Date   Depression    Diabetes mellitus without complication ([place])    Drug abuse ([place])    Hypertension    Pneumothorax     History reviewed. No pertinent surgical history.  There were no vitals filed for this visit.    Subjective Assessment - 08/28/21 0935     Subjective pt arrives reporting a corn removed on her R foot 2 years ago and the pain has progressivley worsening with pain from the foot to the knee and to back. She reports the pain steems to fluctate depending on activity. pt has a nurse 7 days a week that helps with bathing. she uses a walker at home but doesn't use it when she is out. She denies any red flags.she notes the pain starts in the foot and works up to the back starting as tingling and then will progress to pain.    How long can you sit comfortably? unlimited    How long can you stand comfortably? 3 min    How long can you walk comfortably? 3 min    Diagnostic tests nothing recent    Patient Stated Goals decrease pain, to be able to move around more    Currently in Pain? Yes    Pain Score 0-No pain   at [facility] 10/10   Pain Location Leg    Pain Orientation Right    Pain Descriptors / Indicators Tingling    Pain Type Chronic pain     Pain Onset More than a month ago    Pain Frequency Intermittent    Aggravating Factors  standing/ walking, chanes in temperature    Pain Relieving Factors heat, sleepings with heating pad    Effect of Pain on Daily Activities limited standing/ walking                [facility] PT Assessment - 08/28/21 0001       Assessment   Medical Diagnosis Primary osteoarthritis involving multiple joints (M89.49)    Referring Provider (PT) Lenoria Chime, MD    Onset Date/Surgical Date --   2 years   Hand Dominance Right    Next MD Visit plans to go again next mont    Prior Therapy yes      Precautions   Precautions None      Restrictions   Weight Bearing Restrictions No      Balance Screen   Has the patient fallen in the past 6 months Yes    How many times? 1    Has the patient had a decrease in activity level because of a fear of falling?  No  Is the patient reluctant to leave their home because of a fear of falling?  No      Home Environment   Living Environment Private residence    Living Arrangements Alone    Type of [place] Access Level entry    [place] - single point;Walker - 2 wheels   rollator   Additional Comments pt has a nurse/ aid 7  days week      Prior Function   Level of Independence Independent with basic ADLs;Needs assistance with homemaking;Needs assistance with ADLs    Grooming Moderate    Meal Prep Moderate    Vocation On disability      Cognition   Overall Cognitive Status Difficult to assess      Observation/Other Assessments   Observations pt changed positions while seated multipole times while obtaining subjective portion    Focus on Therapeutic Outcomes (FOTO)  N/A MCD      Posture/Postural Control   Posture/Postural Control Postural limitations    Postural Limitations Rounded Shoulders;Forward head      ROM / Strength   AROM / PROM / Strength AROM;Strength      AROM   Overall AROM  Within  functional limits for tasks performed    AROM Assessment Site Knee;Ankle      Strength   Overall Strength Comments limited effort given during testing with mulitple verbal cues for engagment    Strength Assessment Site Hip;Knee;Ankle    Right/Left Hip Right;Left    Right Hip Flexion 3+/5    Right Hip Extension 3+/5    Left Hip Flexion 3+/5    Right/Left Knee Right;Left    Right Knee Flexion 4-/5    Right Knee Extension 4-/5    Left Knee Flexion 3+/5    Left Knee Extension 3+/5    Right/Left Ankle Right;Left    Right Ankle Dorsiflexion 3+/5    Right Ankle Plantar Flexion 3+/5    Right Ankle Inversion 3+/5    Right Ankle Eversion 3+/5    Left Ankle Dorsiflexion 3+/5    Left Ankle Plantar Flexion 3+/5    Left Ankle Inversion 3+/5    Left Ankle Eversion 3+/5      Palpation   Palpation comment pt noted tenderness at the R 1st MTP joint. while attempting to palpate the  calves, quads and glutes pt tensed her leg and depsite multiple cues couldn't relax                        Objective measurements completed on examination: See above findings.                PT Education - 08/28/21 0954     Education Details evaluation findings, POC, goals, HEP with proper form.    Person(s) Educated Patient    Methods Explanation;Verbal cues;Handout    Comprehension Verbalized understanding;Verbal cues required              PT Short Term Goals - 08/28/21 1004       PT SHORT TERM GOAL #1   Title pt to be IND with initial HEP    Baseline no previous HEP    Time 3    Period Weeks    Status New    Target Date 09/25/21      PT SHORT TERM GOAL #2   Title pt to verbalize/ demo efficient posture and lifting mechancis  to reduce and prevent RLE pain    Baseline no previous knowledge of lifting mechanics.    Time 3    Period Weeks    Status New    Target Date 09/25/21      PT SHORT TERM GOAL #3   Title -    Baseline -               PT Long Term  Goals - 08/28/21 1005       PT LONG TERM GOAL #1   Title pt to be able to stand and walk for >/= 30 min for short community ambulation and in general in home mobility    Baseline walks/ standing 3 min    Time 8    Period Weeks    Status New    Target Date 10/23/21      PT LONG TERM GOAL #2   Title increase bil hip strenght to >/= 4/5 to promote stability with walking/ standing    Time 8    Period Weeks    Status New    Target Date 10/23/21      PT LONG TERM GOAL #3   Title increase bil knee / ankle strength to >/= 4-/5 to promote knee/ ankle stability to promote balance / safety    Baseline 3+/5 in all MMT, limited compliance which reliability fo testing is limited    Time 8    Period Weeks    Status New    Target Date 10/23/21      PT LONG TERM GOAL #4   Title pt to be IND with all HEP and is able to maintain and progress current LOF IND    Baseline no previous HEP    Time 8    Period Weeks    Status New    Target Date 10/23/21                    Plan - 08/28/21 0954     Clinical Impression Statement pt presents to OPPT with CC or RLE pain starting 2 years ago following a corn removal, and notes the pain progresses to her back. She has functional ROM in bil LE but exhibits limited compliance with MMT requiring mulitple cues for activation/ participation limiting reliability of testing. She reported that she moves around in her home with a RW/ SPC but did not come to her session with either today, and noted she also does a lot of laying down moving only from room to room, and has an aid that comes 7 days a week that assist with bathing and meal prep. She would benefit from physical therapy to promote hip/ knee / ankle strength, promote standing/ walking tolerance, and maximize her function by addressing the deficits listed.    Personal Factors and Comorbidities Comorbidity 2;Behavior Pattern;Social Background;Past/Current Experience    Comorbidities hx of depression,  DM    Examination-Activity Limitations Stand;Stairs;Squat;Locomotion Level;Lift    Examination-Participation Restrictions Meal Prep;Laundry;Cleaning    Stability/Clinical Decision Making Unstable/Unpredictable    Clinical Decision Making High    Rehab Potential Fair    PT Frequency 1x / week   inital auth 1 x a week for 3 weeks.   PT Duration 8 weeks    PT Treatment/Interventions ADLs/Self Care Home Management;Cryotherapy;Electrical Stimulation;Iontophoresis 4mg /ml Dexamethasone;Moist Heat;Ultrasound;Gait training;Stair training;Functional mobility training;Therapeutic activities;Therapeutic exercise;Balance training;Neuromuscular re-education;Manual techniques;Passive range of motion;Taping;Patient/family education    PT Next Visit Plan review/ update HEP PRN, gross LE strengthening, standing tolerance, PNE  PT Home Exercise Plan [id number] -  LAQ, bridge, ankle ABCs, towel IR/ER with ankle,    Consulted and Agree with Plan of Care Patient             Patient will benefit from skilled therapeutic intervention in order to improve the following deficits and impairments:  Improper body mechanics, Increased muscle spasms, Decreased strength, Postural dysfunction, Pain, Decreased activity tolerance, Decreased endurance, Difficulty walking, Decreased coordination  Visit Diagnosis: Pain in right ankle and joints of right foot - Plan: PT plan of care cert/re-cert  Chronic pain of right knee - Plan: PT plan of care cert/re-cert  Pain in right hip - Plan: PT plan of care cert/re-cert  Other abnormalities of gait and mobility - Plan: PT plan of care cert/re-cert  Muscle weakness (generalized) - Plan: PT plan of care cert/re-cert     Problem List Patient Active Problem List   Diagnosis Date Noted   Excessive cerumen in ear canal 08/04/2020   Nutritional counseling 07/03/2020   Hypercalcemia 07/03/2020   GERD (gastroesophageal reflux disease) 06/19/2020   Paronychia of right middle  finger 05/19/2020   COPD (chronic obstructive pulmonary disease) ([place]) 12/08/2019   Prediabetes 08/13/2019   Loud snoring 07/01/2019   Neurotic excoriations 05/20/2019   Complex regional pain syndrome type 1 of lower extremity (Right) 11/10/2017   Cocaine abuse ([place]) 11/10/2017   History of tobacco abuse 11/10/2017   Osteoarthritis 11/10/2017   Chronic shoulder pain (Bilateral) (L>R) 11/10/2017   Dextroscoliosis 11/10/2017   DDD (degenerative disc disease), cervical 11/10/2017   Knee pain, chronic (Secondary Area of Pain) (right) 11/09/2017   Great toe pain (Primary Area of Pain) (Right) 10/23/2017   Chronic lower extremity pain (Tertiary Area of Pain) (Right) 10/23/2017   Chronic pain syndrome 10/23/2017   Restrictive lung disease 07/23/2016   Essential hypertension 03/22/2016   High risk bisexual behavior 05/18/2015   CATARACTS, BILATERAL 06/24/2007   Bipolar affective disorder ([place]) 10/03/2006   Constipation 10/03/2006   [name] [place] PT, DPT, LAT, ATC  08/28/21  10:16 AM     [facility] [address] [place], [place], [address] Phone: [phone]   Fax:  [phone]  Name: TAMEAKA EICHHORN MRN: [id number] Date of Birth: Jan 26, 1961

## 2021-08-29 LAB — HM DIABETES EYE EXAM

## 2021-08-30 ENCOUNTER — Other Ambulatory Visit: Payer: Self-pay | Admitting: Family Medicine

## 2021-08-30 ENCOUNTER — Other Ambulatory Visit: Payer: Self-pay

## 2021-08-30 ENCOUNTER — Ambulatory Visit
Admission: RE | Admit: 2021-08-30 | Discharge: 2021-08-30 | Disposition: A | Discharge status: Home / Self Care | Payer: Medicaid Other | Source: Ambulatory Visit | Attending: Family Medicine | Admitting: Family Medicine

## 2021-08-30 DIAGNOSIS — Z1231 Encounter for screening mammogram for malignant neoplasm of breast: Secondary | ICD-10-CM

## 2021-09-05 ENCOUNTER — Ambulatory Visit: Payer: Medicaid Other | Admitting: Physical Therapy

## 2021-09-05 ENCOUNTER — Encounter: Payer: Self-pay | Admitting: Physical Therapy

## 2021-09-05 ENCOUNTER — Other Ambulatory Visit: Payer: Self-pay

## 2021-09-05 DIAGNOSIS — M6281 Muscle weakness (generalized): Secondary | ICD-10-CM

## 2021-09-05 DIAGNOSIS — R2689 Other abnormalities of gait and mobility: Secondary | ICD-10-CM

## 2021-09-05 DIAGNOSIS — M25551 Pain in right hip: Secondary | ICD-10-CM

## 2021-09-05 DIAGNOSIS — M25571 Pain in right ankle and joints of right foot: Secondary | ICD-10-CM

## 2021-09-05 DIAGNOSIS — G8929 Other chronic pain: Secondary | ICD-10-CM

## 2021-09-05 NOTE — Therapy (Addendum)
[place], [place], [address] Phone: [phone]   Fax:  [phone]  Physical Therapy Treatment  Patient Details  Name: Rhonda Dominguez MRN: [id number] Date of Birth: 01/23/1961 Referring Provider (PT): Lenoria Chime, MD   Encounter Date: 09/05/2021   PT End of Session - 09/05/21 0722     Visit Number 2    Number of Visits 9    Date for PT Re-Evaluation 11/20/21    Authorization Type [place] MCD    Authorization Time Period 09/05/21-09/25/21    Authorization - Visit Number 1    Authorization - Number of Visits 3    PT Start Time 0716    PT Stop Time 0753    PT Time Calculation (min) 37 min             Past Medical History:  Diagnosis Date   Depression    Diabetes mellitus without complication ([place])    Drug abuse ([place])    Hypertension    Pneumothorax     History reviewed. No pertinent surgical history.  There were no vitals filed for this visit.   Subjective Assessment - 09/05/21 0721     Subjective I think I have gout. I have tingling in my foot today and it comes up to my back. Its not hurting right now.    Currently in Pain? No/denies                  [facility] Adult PT Treatment/Exercise - 09/05/21 0001       Knee/Hip Exercises: Stretches   Active Hamstring Stretch 2 reps;10 seconds      Knee/Hip Exercises: Aerobic   Nustep L5 UE/LE x 5 minutes      Knee/Hip Exercises: Seated   Other Seated Knee/Hip Exercises ankle IR/ER towel slides, seated heel raises x20 , seated toe raises x 20    Sit to [employer] 10 reps      Knee/Hip Exercises: Supine   Bridges 10 reps    Straight Leg Raises 10 reps      Knee/Hip Exercises: Sidelying   Hip ABduction 10 reps      Modalities   Modalities Moist Heat      Moist Heat Therapy   Number Minutes Moist Heat 15 Minutes    Moist Heat Location Knee                       PT Short Term Goals - 08/28/21 1004       PT SHORT  TERM GOAL #1   Title pt to be IND with initial HEP    Baseline no previous HEP    Time 3    Period Weeks    Status New    Target Date 09/25/21      PT SHORT TERM GOAL #2   Title pt to verbalize/ demo efficient posture and lifting mechancis to reduce and prevent RLE pain    Baseline no previous knowledge of lifting mechanics.    Time 3    Period Weeks    Status New    Target Date 09/25/21      PT SHORT TERM GOAL #3   Title -    Baseline -               PT Long Term Goals - 08/28/21 1005       PT LONG TERM GOAL #1   Title pt to be able to stand  and walk for >/= 30 min for short community ambulation and in general in home mobility    Baseline walks/ standing 3 min    Time 8    Period Weeks    Status New    Target Date 10/23/21      PT LONG TERM GOAL #2   Title increase bil hip strenght to >/= 4/5 to promote stability with walking/ standing    Time 8    Period Weeks    Status New    Target Date 10/23/21      PT LONG TERM GOAL #3   Title increase bil knee / ankle strength to >/= 4-/5 to promote knee/ ankle stability to promote balance / safety    Baseline 3+/5 in all MMT, limited compliance which reliability fo testing is limited    Time 8    Period Weeks    Status New    Target Date 10/23/21      PT LONG TERM GOAL #4   Title pt to be IND with all HEP and is able to maintain and progress current LOF IND    Baseline no previous HEP    Time 8    Period Weeks    Status New    Target Date 10/23/21                   Plan - 09/05/21 0744     Clinical Impression Statement Pt arrives without pain. She reports compliance with HEP. Began Nustep and reviewed HEP. Pt requires cues and stay on task. Pt declines to complete full session. She requests [place] for her right thigh at end of session.    PT Treatment/Interventions ADLs/Self Care Home Management;Cryotherapy;Electrical Stimulation;Iontophoresis 4mg /ml Dexamethasone;Moist Heat;Ultrasound;Gait  training;Stair training;Functional mobility training;Therapeutic activities;Therapeutic exercise;Balance training;Neuromuscular re-education;Manual techniques;Passive range of motion;Taping;Patient/family education    PT Next Visit Plan review/ update HEP PRN, gross LE strengthening, standing tolerance, PNE    PT Home Exercise Plan [email or web address] -  LAQ, bridge, ankle ABCs, towel IR/ER with ankle,             Patient will benefit from skilled therapeutic intervention in order to improve the following deficits and impairments:  Improper body mechanics, Increased muscle spasms, Decreased strength, Postural dysfunction, Pain, Decreased activity tolerance, Decreased endurance, Difficulty walking, Decreased coordination  Visit Diagnosis: Pain in right ankle and joints of right foot  Chronic pain of right knee  Pain in right hip  Other abnormalities of gait and mobility  Muscle weakness (generalized)     Problem List Patient Active Problem List   Diagnosis Date Noted   Excessive cerumen in ear canal 08/04/2020   Nutritional counseling 07/03/2020   Hypercalcemia 07/03/2020   GERD (gastroesophageal reflux disease) 06/19/2020   Paronychia of right middle finger 05/19/2020   COPD (chronic obstructive pulmonary disease) ([place]) 12/08/2019   Prediabetes 08/13/2019   Loud snoring 07/01/2019   Neurotic excoriations 05/20/2019   Complex regional pain syndrome type 1 of lower extremity (Right) 11/10/2017   Cocaine abuse ([place]) 11/10/2017   History of tobacco abuse 11/10/2017   Osteoarthritis 11/10/2017   Chronic shoulder pain (Bilateral) (L>R) 11/10/2017   Dextroscoliosis 11/10/2017   DDD (degenerative disc disease), cervical 11/10/2017   Knee pain, chronic (Secondary Area of Pain) (right) 11/09/2017   Great toe pain (Primary Area of Pain) (Right) 10/23/2017   Chronic lower extremity pain [facility] Area of Pain) (Right) 10/23/2017   Chronic pain syndrome 10/23/2017   Restrictive lung disease  07/23/2016  Essential hypertension 03/22/2016   High risk bisexual behavior 05/18/2015   CATARACTS, BILATERAL 06/24/2007   Bipolar affective disorder ([place]) 10/03/2006   Constipation 10/03/2006    Dorene Ar, PTA 09/05/2021, 7:54 AM  [facility] [address] [place], [place], [address] Phone: [phone]   Fax:  [phone]  Name: Rhonda Dominguez MRN: [id number] Date of Birth: 1961/11/29

## 2021-09-06 ENCOUNTER — Telehealth: Payer: Self-pay

## 2021-09-06 NOTE — Telephone Encounter (Signed)
Patient calls nurse line reporting she has shingles. Patient reports she broke out in an itchy painful rash (patient would not share where the rash was located.) However, stated she went to her pharmacy last night and the pharmacist told her she has shingles. Patient advised to go to urgent care for evaluation and treatment.

## 2021-09-12 ENCOUNTER — Ambulatory Visit: Payer: Medicaid Other | Attending: Family Medicine | Admitting: Physical Therapy

## 2021-09-12 ENCOUNTER — Other Ambulatory Visit: Payer: Self-pay

## 2021-09-12 DIAGNOSIS — G8929 Other chronic pain: Secondary | ICD-10-CM | POA: Diagnosis present

## 2021-09-12 DIAGNOSIS — M25551 Pain in right hip: Secondary | ICD-10-CM | POA: Insufficient documentation

## 2021-09-12 DIAGNOSIS — M25561 Pain in right knee: Secondary | ICD-10-CM | POA: Diagnosis present

## 2021-09-12 DIAGNOSIS — R2689 Other abnormalities of gait and mobility: Secondary | ICD-10-CM | POA: Diagnosis present

## 2021-09-12 DIAGNOSIS — M25571 Pain in right ankle and joints of right foot: Secondary | ICD-10-CM | POA: Diagnosis not present

## 2021-09-12 DIAGNOSIS — M6281 Muscle weakness (generalized): Secondary | ICD-10-CM | POA: Diagnosis present

## 2021-09-12 NOTE — Therapy (Addendum)
[place], [place], [address] Phone: [phone]   Fax:  [phone]  Physical Therapy Treatment / Discharge  Patient Details  Name: Rhonda Dominguez MRN: [id number] Date of Birth: [birth year] Referring Provider (PT): Lenoria Chime, MD   Encounter Date: 09/12/2021   PT End of Session - 09/12/21 0721     Visit Number 3    Number of Visits 9    Date for PT Re-Evaluation 11/20/21    Authorization Type [place] MCD    Authorization Time Period 09/05/21-09/25/21    Authorization - Visit Number 2    Authorization - Number of Visits 3    PT Start Time 0716    PT Stop Time 0801    PT Time Calculation (min) 45 min             Past Medical History:  Diagnosis Date   Depression    Diabetes mellitus without complication ([place])    Drug abuse ([place])    Hypertension    Pneumothorax     No past surgical history on file.  There were no vitals filed for this visit.   Subjective Assessment - 09/12/21 0719     Subjective I am being tested for gout. The foot feels a little better, stings a little night. The  back and hip are feeling better. I have been walking and doing my exercises.    Currently in Pain? No/denies                [facility] Adult PT Treatment/Exercise - 09/12/21 0001       Knee/Hip Exercises: Aerobic   Nustep L5 UE/LE x 10 minutes      Knee/Hip Exercises: Standing   Heel Raises 10 reps      Knee/Hip Exercises: Seated   Sit to Sand 10 reps      Knee/Hip Exercises: Supine   Bridges 15 reps    Bridges Limitations 5-10 sec holds    Straight Leg Raises 15 reps      Knee/Hip Exercises: Sidelying   Hip ABduction 15 reps      Moist Heat Therapy   Number Minutes Moist Heat 15 Minutes    Moist Heat Location Knee   per pt request                      PT Short Term Goals - 09/12/21 0720       PT SHORT TERM GOAL #1   Title pt to be IND with initial HEP    Time 3     Status Achieved    Target Date 09/25/21      PT SHORT TERM GOAL #2   Title pt to verbalize/ demo efficient posture and lifting mechancis to reduce and prevent RLE pain    Time 3    Period Weeks    Status Achieved    Target Date 09/25/21               PT Long Term Goals - 08/28/21 1005       PT LONG TERM GOAL #1   Title pt to be able to stand and walk for >/= 30 min for short community ambulation and in general in home mobility    Baseline walks/ standing 3 min    Time 8    Period Weeks    Status New    Target Date 10/23/21      PT LONG TERM GOAL #2  Title increase bil hip strenght to >/= 4/5 to promote stability with walking/ standing    Time 8    Period Weeks    Status New    Target Date 10/23/21      PT LONG TERM GOAL #3   Title increase bil knee / ankle strength to >/= 4-/5 to promote knee/ ankle stability to promote balance / safety    Baseline 3+/5 in all MMT, limited compliance which reliability fo testing is limited    Time 8    Period Weeks    Status New    Target Date 10/23/21      PT LONG TERM GOAL #4   Title pt to be IND with all HEP and is able to maintain and progress current LOF IND    Baseline no previous HEP    Time 8    Period Weeks    Status New    Target Date 10/23/21                   Plan - 09/12/21 0757     Clinical Impression Statement Pt reports improvement in pain and compliance with HEP. Reviewed HEP and progressed reps. Began standing ankle strength with good tolerance. Pt was given information on [employer] programs and [employer],. She plans to look into her options and hopefully transition to community wellness. She feels that her HEP is helpful and that she will only come to one more visit. Will plan to check goals next session and likely discharge to HEP.    Personal Factors and Comorbidities Comorbidity 2;Behavior Pattern;Social Background;Past/Current Experience    PT Treatment/Interventions ADLs/Self Care Home  Management;Cryotherapy;Electrical Stimulation;Iontophoresis 60m/ml Dexamethasone;Moist Heat;Ultrasound;Gait training;Stair training;Functional mobility training;Therapeutic activities;Therapeutic exercise;Balance training;Neuromuscular re-education;Manual techniques;Passive range of motion;Taping;Patient/family education    PT Next Visit Plan discharge to HEP next visit    [facility]  LAQ, bridge, ankle ABCs, towel IR/ER with ankle,    Consulted and Agree with Plan of Care Patient             Patient will benefit from skilled therapeutic intervention in order to improve the following deficits and impairments:  Improper body mechanics, Increased muscle spasms, Decreased strength, Postural dysfunction, Pain, Decreased activity tolerance, Decreased endurance, Difficulty walking, Decreased coordination  Visit Diagnosis: Pain in right ankle and joints of right foot  Chronic pain of right knee  Pain in right hip  Other abnormalities of gait and mobility  Muscle weakness (generalized)     Problem List Patient Active Problem List   Diagnosis Date Noted   Excessive cerumen in ear canal 08/04/2020   Nutritional counseling 07/03/2020   Hypercalcemia 07/03/2020   GERD (gastroesophageal reflux disease) 06/19/2020   Paronychia of right middle finger 05/19/2020   COPD (chronic obstructive pulmonary disease) ([place] 12/08/2019   Prediabetes 08/13/2019   Loud snoring 07/01/2019   Neurotic excoriations 05/20/2019   Complex regional pain syndrome type 1 of lower extremity (Right) 11/10/2017   Cocaine abuse ([place] 11/10/2017   History of tobacco abuse 11/10/2017   Osteoarthritis 11/10/2017   Chronic shoulder pain (Bilateral) (L>R) 11/10/2017   Dextroscoliosis 11/10/2017   DDD (degenerative disc disease), cervical 11/10/2017   Knee pain, chronic (Secondary Area of Pain) (right) 11/09/2017   Great toe pain (Primary Area of Pain) (Right) 10/23/2017   [facility] ([facility]) 10/23/2017   Chronic pain syndrome 10/23/2017   Restrictive lung disease 07/23/2016   Essential hypertension 03/22/2016  High risk bisexual behavior 05/18/2015   CATARACTS, BILATERAL 06/24/2007   Bipolar affective disorder ([place]) 10/03/2006   Constipation 10/03/2006    Dorene Ar, PTA 09/12/2021, 9:26 AM  [facility] [address] [place], [place], [address] Phone: [phone]   Fax:  [phone]  Name: Rhonda Dominguez MRN: [id number] Date of Birth: 04-May-1961       PHYSICAL THERAPY DISCHARGE SUMMARY  Visits from Start of Care: 3  Current functional level related to goals / functional outcomes: See goals   Remaining deficits: Current status unknown   Education / Equipment: HEP   Patient agrees to discharge. Patient goals were not met. Patient is being discharged due to not returning since the last visit.   Kristoffer Leamon PT, DPT, LAT, ATC  01/14/22  10:51 AM

## 2021-09-19 ENCOUNTER — Ambulatory Visit: Payer: Medicaid Other | Admitting: Physical Therapy

## 2021-10-15 ENCOUNTER — Other Ambulatory Visit: Payer: Self-pay | Admitting: Family Medicine

## 2021-10-15 DIAGNOSIS — R21 Rash and other nonspecific skin eruption: Secondary | ICD-10-CM

## 2021-11-26 ENCOUNTER — Other Ambulatory Visit: Payer: Self-pay | Admitting: Family Medicine

## 2021-12-11 NOTE — Progress Notes (Addendum)
° ° °  SUBJECTIVE:   CHIEF COMPLAINT / HPI: Leg Weakness  Skin Lesions Patient has been lesions generalized all over body mainly in the lower extremity but also all over arms. Has 1 spot on chest and face that are more raised than the other areas. Says that this has been going on for 4 months, and says that they are really itchy and painful.  She has been picking at them and scratching them and there are multiple healed sites over her legs.  She says that there are new detergents however has also moved into a new apartment 4 months ago as well.  She denies any kind of bedbugs and says everything is new in her house however says that she did lay on the floor a couple of times and there were "black little bugs that she thinks were ants."  Patient states that she is also been having vaginal discharge for the past 2 months which is itchy and sometimes painful.  Denies any new sexual partners and her last sexual encounter was 8+months ago.  Denies any fevers, weight loss, sick symptoms.  Denies any one else with similar symptoms. No known new medications.   Constipation Patient says that she is constipated and would like a refill for senna.   PERTINENT  PMH / PSH: chronic lower extremity pain, osteoarthritis  OBJECTIVE:   BP 130/81    Pulse 77    Ht 5\' 7"  (1.702 m)    Wt 164 lb 6.4 oz (74.6 kg)    SpO2 99%    BMI 25.75 kg/m   General: awake, alert, responsive to questions Head: Normocephalic atraumatic, lesion on right side of face CV: Regular rate and rhythm no murmurs rubs or gallops Respiratory: chest rises symmetrically,  no increased work of breathing Abdomen: Soft, non-tender, non-distended Skin: Flat red papular lesions on lower extremities bilaterally profusely, some scattered lesions on arms bilaterally. One raise lesion on right side of chest with pearly appearance, discoloration on center and tenderness to palpation, on lesion on right cheek raised as well Neuro: No focal  deficits        ASSESSMENT/PLAN:   Skin lesions, generalized Differential includes bug bites as areas on extremities seem to be where patient is most affected and did move into new house 4 months ago along same time when these lesions started happening.  Other differentials include syphilis or HIV however patient denies any fevers or weight loss she has been having some vaginal discharge for the last 2 months denies any new sexual partners.  The lesion on the chest seems different than the rest as the ones on the extremities are flat and 1 on the chest is raised and more pearly.  Less likely to be contact related given how diffuse the rash is. -Referral to dermatology clinic for biopsy of raised lesion on chest -Benadryl cream for itching symptoms, patient already has hydroxyzine -RPR, HIV, CBC today -Patient to schedule appointment for vaginal discharge   Constipation -refilled senna daily and miralax daily  Rhonda Heck, MD [place]

## 2021-12-12 ENCOUNTER — Telehealth: Payer: Self-pay | Admitting: Family Medicine

## 2021-12-12 ENCOUNTER — Encounter: Payer: Self-pay | Admitting: Student

## 2021-12-12 ENCOUNTER — Ambulatory Visit (INDEPENDENT_AMBULATORY_CARE_PROVIDER_SITE_OTHER): Payer: Medicaid Other | Admitting: Student

## 2021-12-12 ENCOUNTER — Other Ambulatory Visit: Payer: Self-pay

## 2021-12-12 VITALS — BP 130/81 | HR 77 | Ht 67.0 in | Wt 164.4 lb

## 2021-12-12 DIAGNOSIS — L989 Disorder of the skin and subcutaneous tissue, unspecified: Secondary | ICD-10-CM

## 2021-12-12 DIAGNOSIS — K5900 Constipation, unspecified: Secondary | ICD-10-CM | POA: Diagnosis not present

## 2021-12-12 MED ORDER — BENADRYL ITCH STOPPING 2 % EX GEL: 1.0000 "application " | Freq: Three times a day (TID) | CUTANEOUS | 0 refills | Status: DC | PRN

## 2021-12-12 MED ORDER — SENNA 8.6 MG PO TABS: 1.0000 | ORAL_TABLET | Freq: Every day | ORAL | 0 refills | Status: DC | PRN

## 2021-12-12 MED ORDER — POLYETHYLENE GLYCOL 3350 17 G PO PACK: 17.0000 g | PACK | Freq: Every day | ORAL | 11 refills | Status: DC

## 2021-12-12 NOTE — Patient Instructions (Addendum)
It was great to see you! Thank you for allowing me to participate in your care!   I recommend that you always bring your medications to each appointment as this makes it easy to ensure we are on the correct medications and helps [place] not miss when refills are needed.  Our plans for today:  - I have prescribed some benadryl cream for the itching until your dermatology appointment - I have prescribed some senna and miralax for your constipation - we are getting some labs today - please come back if you continue to have vaginal discharge  We are checking some labs today, I will call you if they are abnormal will send you a MyChart message or a letter if they are normal.  If you do not hear about your labs in the next 2 weeks please let us know.  Take care and seek immediate care sooner if you develop any concerns. Please remember to show up 15 minutes before your scheduled appointment time!  Gerrit Heck, MD [place]

## 2021-12-12 NOTE — Telephone Encounter (Signed)
Pt is requesting a prescription to help with bowel movement.  Need another medication for itching (benadryl). The medicaid will not pay for the medications over the counter. Pt ask that Dr Jinny Sanders call her today.

## 2021-12-12 NOTE — Assessment & Plan Note (Addendum)
Differential includes bug bites as areas on extremities seem to be where patient is most affected and did move into new house 4 months ago along same time when these lesions started happening.  Other differentials include syphilis or HIV however patient denies any fevers or weight loss she has been having some vaginal discharge for the last 2 months denies any new sexual partners.  The lesion on the chest seems different than the rest as the ones on the extremities are flat and 1 on the chest is raised and more pearly.  Less likely to be contact related given how diffuse the rash is. -Referral to dermatology clinic for biopsy of raised lesion on chest -Benadryl cream for itching symptoms, patient already has hydroxyzine -RPR, HIV, CBC today -Patient to schedule appointment for vaginal discharge

## 2021-12-13 LAB — CBC
Hematocrit: 37.8 % (ref 34.0–46.6)
Hemoglobin: 12 g/dL (ref 11.1–15.9)
MCH: 25.4 pg — ABNORMAL LOW (ref 26.6–33.0)
MCHC: 31.7 g/dL (ref 31.5–35.7)
MCV: 80 fL (ref 79–97)
Platelets: 246 10*3/uL (ref 150–450)
RBC: 4.73 x10E6/uL (ref 3.77–5.28)
RDW: 14.5 % (ref 11.7–15.4)
WBC: 6.6 10*3/uL (ref 3.4–10.8)

## 2021-12-13 LAB — HIV ANTIBODY (ROUTINE TESTING W REFLEX): HIV Screen 4th Generation wRfx: NONREACTIVE

## 2021-12-13 LAB — RPR: RPR Ser Ql: NONREACTIVE

## 2021-12-15 ENCOUNTER — Encounter: Payer: Self-pay | Admitting: Student

## 2021-12-15 ENCOUNTER — Other Ambulatory Visit: Payer: Self-pay | Admitting: Student

## 2021-12-15 DIAGNOSIS — R21 Rash and other nonspecific skin eruption: Secondary | ICD-10-CM

## 2021-12-15 MED ORDER — GOLD BOND MEDICATED ANTI ITCH 1-0.5-5 % EX LOTN: 1.0000 "application " | TOPICAL_LOTION | Freq: Three times a day (TID) | CUTANEOUS | 0 refills | Status: DC

## 2021-12-15 NOTE — Progress Notes (Signed)
Attempted calling patient x 3 about her cream not being covered (benadryl cream). Had sent in senna for constipation at last visit.  -sent in gold bond itch cream (OTC 4-6$) -sent letter of normal RPR/HIV

## 2021-12-17 ENCOUNTER — Telehealth: Payer: Self-pay | Admitting: *Deleted

## 2021-12-17 NOTE — Telephone Encounter (Signed)
Pt calling in stating that her meds that were prescribed on 1/4 and 1/7 arent covered by medicaid. She wants them to be covered by medicaid. Please advise. Rhonda Dominguez, CMA

## 2021-12-18 ENCOUNTER — Other Ambulatory Visit: Payer: Self-pay | Admitting: Student

## 2021-12-21 NOTE — Telephone Encounter (Signed)
Patient calls nurse line and is very upset that she has not heard anything from our office regarding medications.   Advised patient that I would contact pharmacy for further clarification. Both Senna and Gold Bond lotion are OTC, therefore, insurance will not cover. Patient reports being on fixed income and is requesting that provider send over prescription for medications that are not OTC.   Please advise.   Talbot Grumbling, RN

## 2021-12-23 ENCOUNTER — Other Ambulatory Visit: Payer: Self-pay | Admitting: Student

## 2021-12-23 MED ORDER — HYDROCORTISONE 2.5 % EX CREA: TOPICAL_CREAM | Freq: Two times a day (BID) | CUTANEOUS | 0 refills | Status: DC

## 2021-12-23 NOTE — Telephone Encounter (Signed)
Called and spoke with patient about rash. She says it has been getting better but still itching. Apologized for the different creams that weren't covered. Advised oral antihistamine for any itchiness. Discussed patient with Dr. Gwendlyn Deutscher and rash on legs could be chronic papular urticaria. Told her I would send her in a steroid cream covered by medicaid (hydrocortisone 2.5% on [place] Medicaid preferred). Advised eucerin cream as well and to avoid picking the skin. Patient had not seen her letter and told her of her negative test results at the last visit. She is scheduled with PCP on 1/18 for her vaginal complaints and 1/19 with dermatology clinic for possible biopsy of chest lesion. She expressed that the miralax and senna weren't covered and wanted a pill covered through medicaid. She stated miralax does not help her and "wants a pill." I advised increased water consumption and fiber intake and to follow up at PCP visit if still continuing.

## 2021-12-23 NOTE — Progress Notes (Signed)
° ° °  SUBJECTIVE:   CHIEF COMPLAINT / HPI:   Nodule on chest: Nodule present on right chest for about one month, but on further questioning states maybe it has been there longer. She has been picking at it she says. She states it sometimes hurts but does not drain.  She continues to scratch at it and states that the more she scratches at it and the more seems to bother her.  Leg rash, bilateral: She states she is scheduled for dermatology tomorrow. She was prescribed benadryl cream and some cream but it wasn't covered by medicaid so she didn't start it.  She continues to scratch at her legs.  She denies any drainage.  PERTINENT  PMH / PSH: None relevant.  OBJECTIVE:   BP 123/90    Pulse 75    Ht 5\' 7"  (1.702 m)    Wt 162 lb (73.5 kg)    SpO2 98%    BMI 25.37 kg/m    General: NAD, pleasant, able to participate in exam Respiratory: No respiratory distress Skin: warm and dry, isolated skin lesion present on right chest with excoriation, no palpable abscess or fluid pocket underneath, see image below taken from previous encounter.  Multiple excoriations present on the legs, no drainage, no vesicular lesions.         ASSESSMENT/PLAN:   Skin lesion, right chest: Has been present for at least a month, maybe longer.  It is pruritic but has not had any drainage.  The patient continues to scratch at it.  On physical exam there is excoriation present and almost feels like a small amount of scar tissue underneath.  There is no palpable abscess or fluid pocket underneath.  There is no drainage or erythema present.  Differential can include a dermatofibroma which she continues to scratch at but it does not seem to dimple on physical exam, versus lesion present due to continued scratching without the ability to heal.  Patient does have multiple excoriations present on areas that she can reach including her arms and legs.  She has an appointment tomorrow with dermatology clinic for this lesion as well  as for the lesions on her legs and I recommend that she keep that appointment with [place] making no medication changes at this time until she gets the expertise.  Rash, bilateral legs: Bilateral legs with multiple areas of excoriation present.  There is no drainage or surrounding erythema.  Patient endorses is continuing to scratch at these lesions and that her legs.  She is previously seen several providers to discuss this rash and has been prescribed topical treatment for it, however she has not picked this up as her Medicaid did not cover it.  She has an appointment tomorrow with dermatology clinic to discuss this rash.  Differential can include contact dermatitis, prurigo nodularis, or less likely insect bites.  Because patient continues to scratch at these lesions I think prurigo nodularis is a likely possibility.  I recommended no medication changes at this time until she sees dermatology clinic tomorrow.  I recommend that she follow-up with me in 1 month to determine how her rash is improving after getting dermatology's expertise.   Lurline Del, [place]

## 2021-12-26 ENCOUNTER — Encounter: Payer: Self-pay | Admitting: Family Medicine

## 2021-12-26 ENCOUNTER — Other Ambulatory Visit: Payer: Self-pay

## 2021-12-26 ENCOUNTER — Ambulatory Visit (INDEPENDENT_AMBULATORY_CARE_PROVIDER_SITE_OTHER): Payer: Medicaid Other | Admitting: Family Medicine

## 2021-12-26 VITALS — BP 123/90 | HR 75 | Ht 67.0 in | Wt 162.0 lb

## 2021-12-26 DIAGNOSIS — R222 Localized swelling, mass and lump, trunk: Secondary | ICD-10-CM | POA: Diagnosis not present

## 2021-12-26 DIAGNOSIS — R21 Rash and other nonspecific skin eruption: Secondary | ICD-10-CM

## 2021-12-26 NOTE — Patient Instructions (Addendum)
Please make sure you go to the dermatology appointment tomorrow.  This will be important to determine the next steps for the rash and skin lesion on your chest.  See back in about 1 month to see how you are doing after we get dermatology's recommendations.

## 2021-12-27 ENCOUNTER — Ambulatory Visit (INDEPENDENT_AMBULATORY_CARE_PROVIDER_SITE_OTHER): Payer: Medicaid Other | Admitting: Family Medicine

## 2021-12-27 ENCOUNTER — Other Ambulatory Visit: Payer: Self-pay

## 2021-12-27 VITALS — BP 122/85 | HR 88 | Ht 67.0 in | Wt 161.8 lb

## 2021-12-27 DIAGNOSIS — L281 Prurigo nodularis: Secondary | ICD-10-CM | POA: Diagnosis present

## 2021-12-27 DIAGNOSIS — R21 Rash and other nonspecific skin eruption: Secondary | ICD-10-CM | POA: Diagnosis not present

## 2021-12-27 DIAGNOSIS — L989 Disorder of the skin and subcutaneous tissue, unspecified: Secondary | ICD-10-CM | POA: Diagnosis not present

## 2021-12-27 MED ORDER — HYDROCORTISONE 2.5 % EX CREA: TOPICAL_CREAM | Freq: Two times a day (BID) | CUTANEOUS | 1 refills | Status: DC

## 2021-12-27 MED ORDER — HYDROXYZINE HCL 25 MG PO TABS: 25.0000 mg | ORAL_TABLET | Freq: Three times a day (TID) | ORAL | 2 refills | Status: DC | PRN

## 2021-12-27 NOTE — Patient Instructions (Signed)
It was great to meet you!  The spots on your skin are a result of recurrent scratching, which can cause trauma and scar tissue over time. The most important step to healing is avoiding scratching. -Continue using the hydrocortisone 2.5% cream twice daily -Continue using a daily moisturizer (Dove is fine, Aveeno and Eucerin are good too) -Take Atarax (also called hydroxyzine) 3 times daily for itching -Do NOT take Benadryl in addition to the Atarax. Taking these together can be dangerous. -You can use bandaids to cover the areas and prevent scratching. -We also suggest wearing gloves and keeping your fingernails short to avoid picking. -Follow up with Dr. Vanessa Quartzsite in 1 month  Take care and seek immediate care sooner if you develop any concerns.  Dr. [facility]

## 2021-12-27 NOTE — Progress Notes (Signed)
° ° °  SUBJECTIVE:   CHIEF COMPLAINT / HPI:   Skin Lesions  Right chest -one skin lesion -present for 1 or 2 months -has grown somewhat in size -described as very itchy, sometimes painful -patient feels like there is something hard inside the lesion -she picks at the skin daily -often scratches until it bleeds -has not tried any creams or other things for relief -notes she does take Atarax 3x/daily which is helpful for itching  2. Bilateral Legs -scattered lesions on bilateral legs -also present for 1-2 months -very itchy -scratches frequently, bleeds if she scratches too much -picked up hydrocortisone 2.5% cream yesterday which she finds very helpful  No history of prior skin issues per patient report. She denies any recent medication changes or new products. Uses dove moisturizing lotion daily  PERTINENT  PMH / PSH: bipolar disorder, chronic pain syndrome  OBJECTIVE:   BP 122/85    Pulse 88    Ht 5\' 7"  (1.702 m)    Wt 161 lb 12.8 oz (73.4 kg)    SpO2 100%    BMI 25.34 kg/m   Gen: alert, non-toxic appearing, NAD Resp: normal respiratory effort Skin:  R anterior chest with 23mm hyperpigmented papule/nodule with central excoriation. No drainage, no surrounding erythema or warmth Bilateral legs with scattered excoriations as seen below     ASSESSMENT/PLAN:   Prurigo nodularis Solitary lesion on right anterior chest and scattered lesions on bilateral legs over past 1-2 months. History and exam most consistent with prurigo nodularis as patient endorses frequent scratching and exam reveals areas of obvious excoriation. Improving with hydrocortisone 2.5% cream per patient report. -Continue hydrocortisone 2.5% cream (patient strongly prefers cream over ointment) -Continue daily moisturizer/emollient -Continue Atarax 25mg  TID prn -Emphasized importance of avoiding scratching -Recommended strategies to reduce picking including bandages, wearing gloves, trimming fingernails  etc. -f/u with PCP in 1 month -could consider obtaining CMP to rule out underlying etiology of pruritus such as liver abnormality if no improvement at follow up     Alcus Dad, MD [place]

## 2021-12-27 NOTE — Assessment & Plan Note (Signed)
Solitary lesion on right anterior chest and scattered lesions on bilateral legs over past 1-2 months. History and exam most consistent with prurigo nodularis as patient endorses frequent scratching and exam reveals areas of obvious excoriation. Improving with hydrocortisone 2.5% cream per patient report. -Continue hydrocortisone 2.5% cream (patient strongly prefers cream over ointment) -Continue daily moisturizer/emollient -Continue Atarax 25mg  TID prn -Emphasized importance of avoiding scratching -Recommended strategies to reduce picking including bandages, wearing gloves, trimming fingernails etc. -f/u with PCP in 1 month -could consider obtaining CMP to rule out underlying etiology of pruritus such as liver abnormality if no improvement at follow up

## 2022-01-01 ENCOUNTER — Ambulatory Visit: Payer: Medicaid Other | Admitting: Pharmacist

## 2022-01-23 NOTE — Progress Notes (Unsigned)
° ° °  SUBJECTIVE:   CHIEF COMPLAINT / HPI:   Prurigo nodularis - Follow up: Previously seen in dermatology clinic on 1/19 and given 2.5% hydrocortisone cream as well as atarax 25mg  tid and daily moisturizer. Recommended follow up today to see how she is improving. Further recommended consideration of checking CMP if no improvement. Today she states ***  Encounter for pap smear: *** Patient due for pap, she has no concerns***  PERTINENT  PMH / PSH: Bipolar affective disorder  OBJECTIVE:   There were no vitals taken for this visit. ***  General: NAD, pleasant, able to participate in exam Respiratory: No respiratory distress Skin: warm and dry, *** Psych: Normal affect and mood  ASSESSMENT/PLAN:   No problem-specific Assessment & Plan notes found for this encounter.      Lurline Del, [place]

## 2022-01-25 ENCOUNTER — Ambulatory Visit: Payer: Medicaid Other | Admitting: Family Medicine

## 2022-02-01 ENCOUNTER — Ambulatory Visit: Payer: Medicaid Other | Admitting: Family Medicine

## 2022-02-26 ENCOUNTER — Telehealth: Payer: Self-pay | Admitting: Family Medicine

## 2022-02-26 NOTE — Telephone Encounter (Signed)
Patient states she is needing a new referral for a new home health nurse, she didn't like the last one, she is needing it sent to [facility] health, she is needing this placed as soon as possible, due to her being without a nurse and needing one 7 days a week.  ? ?Please advise.  ?

## 2022-02-27 NOTE — Telephone Encounter (Signed)
We have received the forms for [facility]. I have placed them in the doctors box. Patient called this morning stating they were sending them over, and that the papers are needing to go to the doctor immediately to be filled out, due to her being with out a home health nurse for a while. Please advise.  ? ?Thanks!  ?

## 2022-02-27 NOTE — Telephone Encounter (Signed)
Received page from [facility], apparently patient's have not been able to reach the [facility] clinic via phone. ? ?Operator wanted me to reach out to this patient who had been trying to call the clinic. ? ?I returned call to patient, patient again stated that she needed referral for new home health nurse sent to [facility].  She needs a specific form (DHB-351) filled out which will be faxed over.  She gave a contact information for [facility] health, phone number [phone]. ?

## 2022-03-06 NOTE — Telephone Encounter (Signed)
Patient called about her [employer] form and states that [employer] needed [place] to redo a section of the original form.  They were supposed to be faxing it back and I checked your box and it wasn't there.  Do you remember getting anything this week from them?   ? ?Baxter Gonzalez,CMA ? ? ?

## 2022-03-08 NOTE — Telephone Encounter (Signed)
Was able to pull original form and realized that there was missing information.  Completed the form and refaxed to [facility].  Taran Hable,CMA ? ?

## 2022-04-23 ENCOUNTER — Emergency Department (HOSPITAL_BASED_OUTPATIENT_CLINIC_OR_DEPARTMENT_OTHER)
Admission: EM | Admit: 2022-04-23 | Discharge: 2022-04-23 | Disposition: A | Discharge status: Home / Self Care | Payer: Medicaid Other | Attending: Emergency Medicine | Admitting: Emergency Medicine

## 2022-04-23 ENCOUNTER — Emergency Department (HOSPITAL_BASED_OUTPATIENT_CLINIC_OR_DEPARTMENT_OTHER): Payer: Medicaid Other

## 2022-04-23 ENCOUNTER — Other Ambulatory Visit: Payer: Self-pay

## 2022-04-23 ENCOUNTER — Encounter (HOSPITAL_BASED_OUTPATIENT_CLINIC_OR_DEPARTMENT_OTHER): Payer: Self-pay | Admitting: Pediatrics

## 2022-04-23 DIAGNOSIS — I10 Essential (primary) hypertension: Secondary | ICD-10-CM | POA: Diagnosis not present

## 2022-04-23 DIAGNOSIS — E119 Type 2 diabetes mellitus without complications: Secondary | ICD-10-CM | POA: Diagnosis not present

## 2022-04-23 DIAGNOSIS — Z7982 Long term (current) use of aspirin: Secondary | ICD-10-CM | POA: Insufficient documentation

## 2022-04-23 DIAGNOSIS — M79671 Pain in right foot: Secondary | ICD-10-CM | POA: Insufficient documentation

## 2022-04-23 DIAGNOSIS — K5900 Constipation, unspecified: Secondary | ICD-10-CM | POA: Diagnosis not present

## 2022-04-23 DIAGNOSIS — Z79899 Other long term (current) drug therapy: Secondary | ICD-10-CM | POA: Diagnosis not present

## 2022-04-23 MED ORDER — CEPHALEXIN 500 MG PO CAPS: 500.0000 mg | ORAL_CAPSULE | Freq: Two times a day (BID) | ORAL | 0 refills | Status: AC

## 2022-04-23 MED ORDER — POLYETHYLENE GLYCOL 3350 17 GM/SCOOP PO POWD
1.0000 | Freq: Once | ORAL | 0 refills | Status: AC
Start: 2022-04-23 — End: 2022-04-23

## 2022-04-23 MED ORDER — FLEET ENEMA 7-19 GM/118ML RE ENEM
1.0000 | ENEMA | Freq: Once | RECTAL | Status: AC
Start: 2022-04-23 — End: 2022-04-23
  Administered 2022-04-23: 1 via RECTAL
  Filled 2022-04-23: qty 1

## 2022-04-23 NOTE — ED Triage Notes (Signed)
C/O constipation x 4 days; c/o right foot pain from stepping on something ?

## 2022-04-23 NOTE — ED Notes (Signed)
Patient transported to X-ray 

## 2022-04-23 NOTE — Discharge Instructions (Addendum)
You were evaluated in the Emergency Department and after careful evaluation, we did not find any emergent condition requiring admission or further testing in the hospital. ? ?Please double or even triple the amount of Miralax you are taking. You may also other over the counter constipation medications such as Senna. You may also take magnesium oxide liquid for severe constipation. In addition, increase the amount of fiber rich foods you eat and decrease the amount of fried foods. See the handout for examples of high-fiber foods.  Please follow-up with your primary care doctor and your nutritionist. ? ?Your x-ray did not reveal any metal or large foreign bodies in your foot.  It would do more damage to open up her foot and try to take it out.  Given your history of diabetes, please take the antibiotic as directed until finished.  Please follow-up with your primary care doctor for close observation to prevent infection.  Check your feet regularly. ? ?Please return to the Emergency Department if you experience any worsening of your condition.  We encourage you to follow up with a primary care provider.  Thank you for allowing [place] to be a part of your care. ? ?

## 2022-04-23 NOTE — ED Provider Notes (Signed)
?[facility] EMERGENCY DEPARTMENT ?Provider Note ? ? ?CSN: [id number] ?Arrival date & time: 04/23/22  1050 ? ?  ? ?History ? ?Chief Complaint  ?Patient presents with  ? Constipation  ? Foot Pain  ? ? ?Rhonda Dominguez is a 61 y.o. female. ? ?HPI ?61 year old female with a history of bipolar disorder, depression, hypertension, drug abuse, DM type II presents to the ER with complaints of constipation and stepping on a foreign body with her foot with foot pain.  Patient states that she has been constipated for the last 3 days.  Has been taking MiraLAX with little relief.  Denies any nausea or vomiting.  Still eating and drinking well.  Denies any fevers or chills.  She also complains of pain to the plantar aspect of her right foot, states that she was in flip-flops and stepped on something and has been having pain to the mid plantar aspect of her foot.  No known foreign bodies.  Reports a history of diabetes but states that she was able to stop taking medication.  She has follow-up with her primary care doctor on June 1. ?  ? ?Home Medications ?Prior to Admission medications   ?Medication Sig Start Date End Date Taking? Authorizing Provider  ?cephALEXin (KEFLEX) 500 MG capsule Take 1 capsule (500 mg total) by mouth 2 (two) times daily for 7 days. 04/23/22 04/30/22 Yes Garald Balding, PA-C  ?polyethylene glycol powder (MIRALAX) 17 GM/SCOOP powder Take 255 g by mouth once for 1 dose. 04/23/22 04/23/22 Yes Garald Balding, PA-C  ?amLODipine (NORVASC) 10 MG tablet TAKE 1 TABLET(10 MG) BY MOUTH DAILY FOR BLOOD PRESSURE 03/05/21   Matilde Haymaker, MD  ?aspirin 81 MG tablet Take 81 mg by mouth daily.    [provider]  ?carbamide peroxide (DEBROX) 6.5 % OTIC solution Place 5 drops into both ears 2 (two) times daily. 08/04/20   Matilde Haymaker, MD  ?diclofenac Sodium (VOLTAREN) 1 % GEL Apply 2 g topically 4 (four) times daily. 12/28/19   Matilde Haymaker, MD  ?DIPHENHYDRAMINE HCL, TOPICAL, (BENADRYL ITCH STOPPING) 2 % GEL  Apply 1 application topically 3 (three) times daily as needed. 12/12/21   Gerrit Heck, MD  ?gabapentin (NEURONTIN) 300 MG capsule Take 1 capsule (300 mg total) by mouth 3 (three) times daily as needed. 08/28/21   Lurline Del, DO  ?hydrochlorothiazide (HYDRODIURIL) 25 MG tablet TAKE 1 TABLET(25 MG) BY MOUTH DAILY FOR BLOOD PRESSURE 11/26/21   Lurline Del, DO  ?hydrocortisone 2.5 % cream Apply topically 2 (two) times daily. 12/27/21   Alcus Dad, MD  ?hydrOXYzine (ATARAX) 25 MG tablet Take 1 tablet (25 mg total) by mouth every 8 (eight) hours as needed for itching. 12/27/21   Alcus Dad, MD  ?meloxicam (MOBIC) 7.5 MG tablet TAKE 1 TABLET(7.5 MG) BY MOUTH DAILY 07/04/21   Lilland, Alana, DO  ?nortriptyline (PAMELOR) 25 MG capsule Take 1 capsule (25 mg total) by mouth at bedtime. 12/28/19   Matilde Haymaker, MD  ?[facility] HFA 108 (90 Base) MCG/ACT inhaler Inhale 2 puffs into the lungs every 6 (six) hours as needed for wheezing or shortness of breath. 07/04/21   Lilland, Alana, DO  ?senna (SENOKOT) 8.6 MG TABS tablet Take 1 tablet (8.6 mg total) by mouth daily as needed for mild constipation. For constipation 12/12/21   Gerrit Heck, MD  ?Tiotropium Bromide-Olodaterol (STIOLTO RESPIMAT) 2.5-2.5 MCG/ACT AERS Inhale 2 puffs into the lungs daily. 03/16/19   Zenia Resides, MD  ?triamcinolone cream (KENALOG) 0.1 %  APPLY TOPICALLY TO THE AFFECTED AREA FOUR TIMES DAILY AS NEEDED 10/15/21   Lurline Del, DO  ?   ? ?Allergies    ?Patient has no known allergies.   ? ?Review of Systems   ?Review of Systems ?Ten systems reviewed and are negative for acute change, except as noted in the HPI.  ? ?Physical Exam ?Updated Vital Signs ?BP (!) 159/108 (BP Location: Right Arm)   Temp 98.4 ?F (36.9 ?C) (Oral)   Resp 18   Ht '5\' 7"'$  (1.702 m)   Wt 65.8 kg   SpO2 97%   BMI 22.71 kg/m?  ?Physical Exam ?Vitals and nursing note reviewed.  ?Constitutional:   ?   General: She is not in acute distress. ?   Appearance: She is  well-developed.  ?HENT:  ?   Head: Normocephalic and atraumatic.  ?Eyes:  ?   Conjunctiva/sclera: Conjunctivae normal.  ?Cardiovascular:  ?   Rate and Rhythm: Normal rate and regular rhythm.  ?   Heart sounds: No murmur heard. ?Pulmonary:  ?   Effort: Pulmonary effort is normal. No respiratory distress.  ?   Breath sounds: Normal breath sounds.  ?Abdominal:  ?   Palpations: Abdomen is soft.  ?   Tenderness: There is no abdominal tenderness.  ?   Comments: Abdomen is soft, minimally tender, distended.  ?Musculoskeletal:     ?   General: Tenderness present. No swelling.  ?   Cervical back: Neck supple.  ?   Comments: Plantar aspect of the right foot with callused over spot to the midfoot.  No visible removal foreign bodies.  Tender to the touch but no erythema, no drainage.  No evidence of skin abrasion or open skin.  ?Skin: ?   General: Skin is warm and dry.  ?   Capillary Refill: Capillary refill takes less than 2 seconds.  ?Neurological:  ?   General: No focal deficit present.  ?   Mental Status: She is alert and oriented to person, place, and time.  ?Psychiatric:     ?   Mood and Affect: Mood normal.  ? ? ?ED Results / Procedures / Treatments   ?Labs ?(all labs ordered are listed, but only abnormal results are displayed) ?Labs Reviewed - No data to display ? ?EKG ?None ? ?Radiology ?DG Abdomen 1 View ? ?Result Date: 04/23/2022 ?CLINICAL DATA:  Constipation. EXAM: ABDOMEN - 1 VIEW COMPARISON:  July 27, 2008. FINDINGS: No abnormal bowel dilatation is noted. Mild amount of stool is noted in the right colon. Phleboliths are noted in the pelvis. IMPRESSION: Mild stool burden is noted.  No abnormal bowel dilatation. Electronically Signed   By: Marijo Conception M.D.   On: 04/23/2022 11:49  ? ?DG Foot Complete Right ? ?Result Date: 04/23/2022 ?CLINICAL DATA:  Knot on the lateral aspect of the foot EXAM: RIGHT FOOT COMPLETE - 3+ VIEW COMPARISON:  None Available. FINDINGS: There is no evidence of fracture or dislocation.  Mild degenerative osteoarthritis of the great toe MTP joint. There is no evidence of arthropathy or other focal bone abnormality. Soft tissues are unremarkable. IMPRESSION: Mild degenerative osteoarthritis at the great toe MTP joint. Otherwise, negative. Electronically Signed   By: Jacqulynn Cadet M.D.   On: 04/23/2022 11:49   ? ?Procedures ?Procedures  ? ? ?Medications Ordered in ED ?Medications  ?sodium phosphate (FLEET) 7-19 GM/118ML enema 1 enema (1 enema Rectal Given 04/23/22 1140)  ? ? ?ED Course/ Medical Decision Making/ A&P ?  ?                        ?  Medical Decision Making ?Amount and/or Complexity of Data Reviewed ?Radiology: ordered. ? ?Risk ?OTC drugs. ?Prescription drug management. ? ? ?Patient presenting to the ER with complaints of constipation and pain to her right midfoot.  Her abdomen is soft and nontender though mildly distended.  KUB without any evidence of small bowel obstruction.  I have low suspicion for small bowel obstruction, or an acute abdomen.  She denies any back pain.  She was given a enema here in the ER and was able to have a small bowel movement. Plantar aspect of right foot has a small callused area to the midfoot, no visible foreign bodies that are easily removable.  Her x-ray does not show any foreign bodies.  The area is tender to the touch but does not appear acutely infected.  No evidence of gross abscess.  Will give Keflex for infection prevention given her history of diabetes, and have her follow-up with her primary care doctor with whom she has an appointment in 2 weeks.  She was given education on increasing MiraLAX dose, taking senna, magnesium oxide if needed, high-fiber diet.  We discussed return precautions including worsening nausea, vomiting, or any other new or concerning symptoms.  She voiced understanding and is agreeable.  Stable for discharge. ?Final Clinical Impression(s) / ED Diagnoses ?Final diagnoses:  ?Constipation, unspecified constipation type  ? ? ?Rx  / DC Orders ?ED Discharge Orders   ? ?      Ordered  ?  cephALEXin (KEFLEX) 500 MG capsule  2 times daily       ? 04/23/22 1217  ?  polyethylene glycol powder (MIRALAX) 17 GM/SCOOP powder   Once       ? 04/23/22 1217  ?

## 2022-04-25 ENCOUNTER — Ambulatory Visit: Payer: Medicaid Other | Admitting: Family Medicine

## 2022-05-09 ENCOUNTER — Ambulatory Visit: Payer: Medicaid Other | Admitting: Family Medicine

## 2022-05-21 ENCOUNTER — Telehealth: Payer: Self-pay | Admitting: Family Medicine

## 2022-05-21 NOTE — Telephone Encounter (Signed)
Patient calls wanting to get tested for cancer. She states her sister is in the hospital right now with a type of cancer that starts with a 'P.' Patient 'demands' her doctor to call her back regarding this. Please advise.

## 2022-05-28 ENCOUNTER — Encounter: Payer: Self-pay | Admitting: Family Medicine

## 2022-05-28 ENCOUNTER — Ambulatory Visit (INDEPENDENT_AMBULATORY_CARE_PROVIDER_SITE_OTHER): Payer: Medicaid Other | Admitting: Family Medicine

## 2022-05-28 ENCOUNTER — Other Ambulatory Visit (HOSPITAL_COMMUNITY)
Admission: RE | Admit: 2022-05-28 | Discharge: 2022-05-28 | Disposition: A | Discharge status: Home / Self Care | Payer: Medicaid Other | Source: Ambulatory Visit | Attending: Family Medicine | Admitting: Family Medicine

## 2022-05-28 VITALS — BP 128/81 | HR 76 | Ht 67.0 in | Wt 157.1 lb

## 2022-05-28 DIAGNOSIS — Z124 Encounter for screening for malignant neoplasm of cervix: Secondary | ICD-10-CM | POA: Diagnosis present

## 2022-05-28 DIAGNOSIS — Z0000 Encounter for general adult medical examination without abnormal findings: Secondary | ICD-10-CM

## 2022-05-28 DIAGNOSIS — G8929 Other chronic pain: Secondary | ICD-10-CM

## 2022-05-28 DIAGNOSIS — N898 Other specified noninflammatory disorders of vagina: Secondary | ICD-10-CM

## 2022-05-28 DIAGNOSIS — M79674 Pain in right toe(s): Secondary | ICD-10-CM

## 2022-05-28 DIAGNOSIS — Z113 Encounter for screening for infections with a predominantly sexual mode of transmission: Secondary | ICD-10-CM | POA: Insufficient documentation

## 2022-05-28 DIAGNOSIS — Z114 Encounter for screening for human immunodeficiency virus [HIV]: Secondary | ICD-10-CM | POA: Diagnosis not present

## 2022-05-28 DIAGNOSIS — Z122 Encounter for screening for malignant neoplasm of respiratory organs: Secondary | ICD-10-CM

## 2022-05-28 DIAGNOSIS — M79604 Pain in right leg: Secondary | ICD-10-CM | POA: Diagnosis not present

## 2022-05-28 LAB — POCT WET PREP (WET MOUNT)
Clue Cells Wet Prep Whiff POC: POSITIVE
Trichomonas Wet Prep HPF POC: ABSENT

## 2022-05-28 NOTE — Progress Notes (Deleted)
    SUBJECTIVE:   CHIEF COMPLAINT / HPI:   Encounter for Pap smear: 61 year old female presenting for Pap smear.  Patient inquires about any additional cancer screening test that can be done as her family member recently was diagnosed with pancreatic cancer.  Had spoken to her about this over the phone prior to this appointment.  She endorses a***smoking history.  PERTINENT  PMH / PSH: ***  OBJECTIVE:   There were no vitals taken for this visit. ***  General: NAD, pleasant, able to participate in exam Cardiac: RRR, no murmurs. Respiratory: CTAB, normal effort, No wheezes, rales or rhonchi Abdomen: Bowel sounds present, nontender, nondistended, no hepatosplenomegaly. Pelvic exam: {pelvic exam:315900}.  Chaperone: Tishira. Skin: warm and dry, no rashes noted Neuro: alert, no obvious focal deficits Psych: Normal affect and mood  ASSESSMENT/PLAN:   No problem-specific Assessment & Plan notes found for this encounter.   Pap smear completed as noted above.  We will follow-up with patient with the results.  ***  Lurline Del, [place]    {    This will disappear when note is signed, click to select method of visit    :1}

## 2022-05-28 NOTE — Patient Instructions (Addendum)
It was a pleasure to see you today!  We will get some labs today.  If they are abnormal or we need to do something about them, I will call you.  If they are normal, I will send you a message on MyChart (if it is active) or a letter in the mail.  If you don't hear from [place] in 2 weeks, please call the office  [phone]. Your mammogram is due in September We will get you scheduled for a chest CT for lung cancer screening I have placed a referral for orthopedics for your foot pain. You should receive a phone call in 1-2 weeks to schedule this appointment. If for any reason you do not receive a phone call or need more help scheduling this appointment, please call our office at [phone].  Be Well,  Dr. Chauncey Reading

## 2022-05-28 NOTE — Assessment & Plan Note (Signed)
Pap completed today. HIV, RPR, G/C, wet prep pending. Mammogram due 9/24. Colonoscopy due 2027. Low dose chest CT for lung cancer screening ordered for 40 pack year history, quit 3 years ago.

## 2022-05-28 NOTE — Progress Notes (Signed)
    SUBJECTIVE:   CHIEF COMPLAINT / HPI:   Pap smear: patient due for pap smear. Would like testing for routine STI's.   Family cancer hx: patient's sister recently diagnosed pancreatic cancer. She wants to ensure she is up to date with all cancer screenings. Mammogram due in September 2023, last WNL in 9/22. Pap performed today. Colonoscopy done in 2017, 1 polyp removed benign, next due in 2027. Patient has h/o COPD with long smoking history, teenage years to discontinuation 3 years ago. She smoked about 1 ppd prior to this. She has roughly a 40 pack year smoking history. She qualifies for lung cancer screening. Discussed risks benefits of lung cancer screening, ordered.  Complex regional pain syndrome of lower right extremity: patient reports that she has continued pain at site of first MTP, h/o surgical correction by [facility] x3, did not want CSI. She requests 2nd opinion by orthopedics.   PERTINENT  PMH / PSH: h/o OA and complex regional pain syndrome, Bipolar disorder, COPD, htn, prediabetes  OBJECTIVE:   BP 128/81   Pulse 76   Ht '5\' 7"'$  (1.702 m)   Wt 157 lb 2 oz (71.3 kg)   SpO2 99%   BMI 24.61 kg/m   Nursing note and vitals reviewed GEN: age-appropriate, AAW, resting comfortably in chair, NAD, WNWD PELVIC:  Normal appearing external female genitalia, normal vaginal epithelium, no abnormal discharge. Normal appearing cervix, but friable after pap smear. Neuro: AOx3  Ext: no edema, scar over first R MTP, no bunions, 2+ DP Psych: good mood, pressured speech, tangential thoughts  ASSESSMENT/PLAN:   Chronic lower extremity pain (Tertiary Area of Pain) (Right) Patient able to walk, but reports significant pain. She has done therapy, h/o surgical intervention x3, requests second opinion from orthopedics.  Healthcare maintenance Pap completed today. HIV, RPR, G/C, wet prep pending. Mammogram due 9/24. Colonoscopy due 2027. Low dose chest CT for lung cancer screening  ordered for 40 pack year history, quit 3 years ago.     Gladys Damme, MD [place]

## 2022-05-28 NOTE — Assessment & Plan Note (Signed)
Patient able to walk, but reports significant pain. She has done therapy, h/o surgical intervention x3, requests second opinion from orthopedics.

## 2022-05-29 ENCOUNTER — Telehealth: Payer: Self-pay | Admitting: Family Medicine

## 2022-05-29 ENCOUNTER — Telehealth: Payer: Self-pay

## 2022-05-29 DIAGNOSIS — B9689 Other specified bacterial agents as the cause of diseases classified elsewhere: Secondary | ICD-10-CM

## 2022-05-29 MED ORDER — METRONIDAZOLE 500 MG PO TABS: 500.0000 mg | ORAL_TABLET | Freq: Two times a day (BID) | ORAL | 0 refills | Status: DC

## 2022-05-29 NOTE — Telephone Encounter (Signed)
Wet prep shows BV.  Called and discussed with patient, will treat with Flagyl 500 mg twice daily x7 days.  Gladys Damme, MD [facility], PGY-3

## 2022-05-29 NOTE — Telephone Encounter (Signed)
Patient calls nurse line stating she missed a call from someone.   Patient reports she had some testing done yesterday and would like to know results.   Advised patient results are not back yet except for wet prep.   Patient reports vaginal discharge with an odor.   Will forward to provider who saw patient.

## 2022-05-30 LAB — CERVICOVAGINAL ANCILLARY ONLY
Chlamydia: NEGATIVE
Comment: NEGATIVE
Comment: NORMAL
Neisseria Gonorrhea: NEGATIVE

## 2022-05-31 ENCOUNTER — Encounter: Payer: Self-pay | Admitting: Family Medicine

## 2022-05-31 LAB — CYTOLOGY - PAP
Comment: NEGATIVE
Diagnosis: NEGATIVE
High risk HPV: NEGATIVE

## 2022-06-03 ENCOUNTER — Other Ambulatory Visit: Payer: Self-pay

## 2022-06-03 MED ORDER — PROAIR HFA 108 (90 BASE) MCG/ACT IN AERS: 2.0000 | INHALATION_SPRAY | Freq: Four times a day (QID) | RESPIRATORY_TRACT | 0 refills | Status: DC | PRN

## 2022-06-06 ENCOUNTER — Ambulatory Visit: Payer: Medicaid Other | Admitting: Orthopedic Surgery

## 2022-06-06 ENCOUNTER — Ambulatory Visit: Payer: Medicaid Other | Admitting: Pharmacist

## 2022-06-07 ENCOUNTER — Ambulatory Visit (HOSPITAL_COMMUNITY): Payer: Medicaid Other | Attending: Family Medicine

## 2022-06-12 ENCOUNTER — Ambulatory Visit (INDEPENDENT_AMBULATORY_CARE_PROVIDER_SITE_OTHER): Payer: Medicaid Other | Admitting: Family

## 2022-06-12 DIAGNOSIS — B351 Tinea unguium: Secondary | ICD-10-CM

## 2022-06-12 DIAGNOSIS — M159 Polyosteoarthritis, unspecified: Secondary | ICD-10-CM | POA: Diagnosis not present

## 2022-06-12 DIAGNOSIS — M19071 Primary osteoarthritis, right ankle and foot: Secondary | ICD-10-CM | POA: Diagnosis not present

## 2022-06-12 NOTE — Progress Notes (Signed)
Office Visit Note   Patient: Rhonda Dominguez           Date of Birth: [birth year]           MRN: [id number] Visit Date: 06/12/2022              Requested by: Zenia Resides, MD [address] [place],  [place] [address] PCP: Wells Guiles, DO  Chief Complaint  Patient presents with   Right Foot - Follow-up      HPI: The patient is a 61 year old woman who presents today complaining of pain to her right great toe she has had prior bunionectomy she has pain primarily over the MTP joint.  She also is requesting a nail trim she has previously followed with podiatry at [facility].  She states she has had cortisone shots for what seems to be osteoarthritis of the MTP joint patient would not like any further injections states these are too painful and not helpful  Assessment & Plan: Visit Diagnoses: No diagnosis found.  Plan: Nails trimmed x10 patient tolerated this well she would like a follow-up appointment with Dr. Sharol Given to discuss surgical options such as fusion of the metatarsal phalangeal joint  Follow-Up Instructions: Return in about 3 weeks (around 07/03/2022), or surg c/s c duda.   Ortho Exam  Patient is alert, oriented, no adenopathy, well-dressed, normal affect, normal respiratory effort. On examination of bilateral feet she does have a well-healed surgical scar along her medial column great toe there is no edema no erythema she does have point tenderness to the MTP joint pain with flexion of the great toe she has thickened and discolored onychomycotic nails x10 she is unable to safely trim her own nails these were trimmed today patient tolerated well  Imaging: No results found. No images are attached to the encounter.  Labs: Lab Results  Component Value Date   HGBA1C 5.7 06/19/2020   HGBA1C 6.0 03/14/2020   HGBA1C 5.9 12/28/2019   ESRSEDRATE 20 10/23/2017   CRP 12.8 (H) 10/23/2017     Lab Results  Component Value Date   ALBUMIN 4.6 10/23/2017    ALBUMIN 4.0 10/11/2014   ALBUMIN 4.5 03/10/2012    Lab Results  Component Value Date   MG 2.5 (H) 10/23/2017   MG 2.1 08/26/2007   No results found for: "VD25OH"  No results found for: "PREALBUMIN"    Latest Ref Rng & Units 12/12/2021    9:45 AM 06/19/2020    3:49 PM 10/13/2018    3:03 PM  CBC EXTENDED  WBC 3.4 - 10.8 x10E3/uL 6.6  7.4  8.2   RBC 3.77 - 5.28 x10E6/uL 4.73  4.90  4.65   Hemoglobin 11.1 - 15.9 g/dL 12.0  12.7  11.9   HCT 34.0 - 46.6 % 37.8  39.2  38.5   Platelets 150 - 450 x10E3/uL 246  254  283      There is no height or weight on file to calculate BMI.  Orders:  No orders of the defined types were placed in this encounter.  No orders of the defined types were placed in this encounter.    Procedures: No procedures performed  Clinical Data: No additional findings.  ROS:  All other systems negative, except as noted in the HPI. Review of Systems  Objective: Vital Signs: There were no vitals taken for this visit.  Specialty Comments:  No specialty comments available.  PMFS History: Patient Active Problem List   Diagnosis  Date Noted   Healthcare maintenance 05/28/2022   Prurigo nodularis 12/27/2021   Excessive cerumen in ear canal 08/04/2020   Nutritional counseling 07/03/2020   Hypercalcemia 07/03/2020   GERD (gastroesophageal reflux disease) 06/19/2020   Paronychia of right middle finger 05/19/2020   COPD (chronic obstructive pulmonary disease) ([place]) 12/08/2019   Prediabetes 08/13/2019   Loud snoring 07/01/2019   Neurotic excoriations 05/20/2019   Skin lesions, generalized 10/16/2018   Complex regional pain syndrome type 1 of lower extremity (Right) 11/10/2017   Cocaine abuse ([place]) 11/10/2017   History of tobacco abuse 11/10/2017   Osteoarthritis 11/10/2017   Chronic shoulder pain (Bilateral) (L>R) 11/10/2017   Dextroscoliosis 11/10/2017   DDD (degenerative disc disease), cervical 11/10/2017   Knee pain, chronic (Secondary Area of  Pain) (right) 11/09/2017   Great toe pain (Primary Area of Pain) (Right) 10/23/2017   Chronic lower extremity pain (Tertiary Area of Pain) (Right) 10/23/2017   Chronic pain syndrome 10/23/2017   Restrictive lung disease 07/23/2016   Essential hypertension 03/22/2016   High risk bisexual behavior 05/18/2015   CATARACTS, BILATERAL 06/24/2007   Bipolar affective disorder ([place]) 10/03/2006   Constipation 10/03/2006   Past Medical History:  Diagnosis Date   Depression    Diabetes mellitus without complication ([place])    Drug abuse ([place])    Hypertension    Pneumothorax     No family history on file.  No past surgical history on file. Social History   Occupational History   Not on file  Tobacco Use   Smoking status: Former    Packs/day: 1.00    Years: 40.00    Total pack years: 40.00    Types: Cigarettes    Start date: 12/09/1978    Quit date: 03/10/2019    Years since quitting: 3.2   Smokeless tobacco: Never   Tobacco comments:    Smoked 1-2 ppd for 40 years  QUIT 02/2019  Substance and Sexual Activity   Alcohol use: No   Drug use: No   Sexual activity: Not on file

## 2022-06-14 ENCOUNTER — Encounter: Payer: Self-pay | Admitting: Family

## 2022-06-25 ENCOUNTER — Other Ambulatory Visit: Payer: Self-pay | Admitting: Family Medicine

## 2022-06-25 DIAGNOSIS — R21 Rash and other nonspecific skin eruption: Secondary | ICD-10-CM

## 2022-06-25 DIAGNOSIS — L989 Disorder of the skin and subcutaneous tissue, unspecified: Secondary | ICD-10-CM

## 2022-07-04 ENCOUNTER — Ambulatory Visit (INDEPENDENT_AMBULATORY_CARE_PROVIDER_SITE_OTHER): Payer: Medicaid Other | Admitting: Orthopedic Surgery

## 2022-07-04 ENCOUNTER — Ambulatory Visit (INDEPENDENT_AMBULATORY_CARE_PROVIDER_SITE_OTHER): Payer: Medicaid Other

## 2022-07-04 DIAGNOSIS — M19071 Primary osteoarthritis, right ankle and foot: Secondary | ICD-10-CM | POA: Diagnosis not present

## 2022-07-04 DIAGNOSIS — M2021 Hallux rigidus, right foot: Secondary | ICD-10-CM

## 2022-07-05 ENCOUNTER — Encounter: Payer: Self-pay | Admitting: Orthopedic Surgery

## 2022-07-05 NOTE — Progress Notes (Signed)
Office Visit Note   Patient: Rhonda Dominguez           Date of Birth: 1961/04/16           MRN: [id number] Visit Date: 07/04/2022              Requested by: Wells Guiles, DO [place],  [place] [id number] PCP: Wells Guiles, DO  Chief Complaint  Patient presents with   Right Foot - Pain    GT pain      HPI: Patient is a 61 year old woman who states she has had previous bunion surgery to the right great toe.  She states she has had a steroid injection of the MTP joint without relief.  Assessment & Plan: Visit Diagnoses:  1. Osteoarthritis of joint of toe of right foot   2. Hallux rigidus, right foot     Plan: Discussed nonoperative treatment with stiff soled shoe versus fusion.  Patient states she would like to proceed with fusion in August of the MTP joint.  Risk and benefits were discussed patient states she understands wished to proceed at this time.  Follow-Up Instructions: Return if symptoms worsen or fail to improve.   Ortho Exam  Patient is alert, oriented, no adenopathy, well-dressed, normal affect, normal respiratory effort. Examination patient has a good palpable pulse.  She has essentially no range of motion of the great toe MTP joint there is a previous dorsal incision over the MTP joint from her reported bunion surgery but I do not see indications of medial eminence resection.  Imaging: No results found. No images are attached to the encounter.  Labs: Lab Results  Component Value Date   HGBA1C 5.7 06/19/2020   HGBA1C 6.0 03/14/2020   HGBA1C 5.9 12/28/2019   ESRSEDRATE 20 10/23/2017   CRP 12.8 (H) 10/23/2017     Lab Results  Component Value Date   ALBUMIN 4.6 10/23/2017   ALBUMIN 4.0 10/11/2014   ALBUMIN 4.5 03/10/2012    Lab Results  Component Value Date   MG 2.5 (H) 10/23/2017   MG 2.1 08/26/2007   No results found for: "VD25OH"  No results found for: "PREALBUMIN"    Latest Ref Rng & Units 12/12/2021    9:45 AM 06/19/2020     3:49 PM 10/13/2018    3:03 PM  CBC EXTENDED  WBC 3.4 - 10.8 x10E3/uL 6.6  7.4  8.2   RBC 3.77 - 5.28 x10E6/uL 4.73  4.90  4.65   Hemoglobin 11.1 - 15.9 g/dL 12.0  12.7  11.9   HCT 34.0 - 46.6 % 37.8  39.2  38.5   Platelets 150 - 450 x10E3/uL 246  254  283      There is no height or weight on file to calculate BMI.  Orders:  Orders Placed This Encounter  Procedures   XR Foot 2 Views Right   No orders of the defined types were placed in this encounter.    Procedures: No procedures performed  Clinical Data: No additional findings.  ROS:  All other systems negative, except as noted in the HPI. Review of Systems  Objective: Vital Signs: There were no vitals taken for this visit.  Specialty Comments:  No specialty comments available.  PMFS History: Patient Active Problem List   Diagnosis Date Noted   Healthcare maintenance 05/28/2022   Prurigo nodularis 12/27/2021   Excessive cerumen in ear canal 08/04/2020   Nutritional counseling 07/03/2020   Hypercalcemia 07/03/2020   GERD (gastroesophageal reflux  disease) 06/19/2020   Paronychia of right middle finger 05/19/2020   COPD (chronic obstructive pulmonary disease) ([place]) 12/08/2019   Prediabetes 08/13/2019   Loud snoring 07/01/2019   Neurotic excoriations 05/20/2019   Skin lesions, generalized 10/16/2018   Complex regional pain syndrome type 1 of lower extremity (Right) 11/10/2017   Cocaine abuse ([place]) 11/10/2017   History of tobacco abuse 11/10/2017   Osteoarthritis 11/10/2017   Chronic shoulder pain (Bilateral) (L>R) 11/10/2017   Dextroscoliosis 11/10/2017   DDD (degenerative disc disease), cervical 11/10/2017   Knee pain, chronic (Secondary Area of Pain) (right) 11/09/2017   Great toe pain (Primary Area of Pain) (Right) 10/23/2017   Chronic lower extremity pain (Tertiary Area of Pain) (Right) 10/23/2017   Chronic pain syndrome 10/23/2017   Restrictive lung disease 07/23/2016   Essential hypertension  03/22/2016   High risk bisexual behavior 05/18/2015   CATARACTS, BILATERAL 06/24/2007   Bipolar affective disorder ([place]) 10/03/2006   Constipation 10/03/2006   Past Medical History:  Diagnosis Date   Depression    Diabetes mellitus without complication ([place])    Drug abuse ([place])    Hypertension    Pneumothorax     History reviewed. No pertinent family history.  History reviewed. No pertinent surgical history. Social History   Occupational History   Not on file  Tobacco Use   Smoking status: Former    Packs/day: 1.00    Years: 40.00    Total pack years: 40.00    Types: Cigarettes    Start date: 12/09/1978    Quit date: 03/10/2019    Years since quitting: 3.3   Smokeless tobacco: Never   Tobacco comments:    Smoked 1-2 ppd for 40 years  QUIT 02/2019  Substance and Sexual Activity   Alcohol use: No   Drug use: No   Sexual activity: Not on file

## 2022-07-08 ENCOUNTER — Telehealth: Payer: Self-pay

## 2022-07-08 NOTE — Telephone Encounter (Signed)
Patient calls nurse line requesting new DME orders for rolling walker with seat and cane.   Patient states that she has this equipment, however, is in need of replacements due to current equipment breaking.   Forwarding to provider to place order. Once orders are placed, please route back to RN team.   Thanks.   Talbot Grumbling, RN

## 2022-07-08 NOTE — Telephone Encounter (Signed)
Patient returns call to nurse line regarding incontinence supplies. I attempted to call company, however, had to leave VM.   Will await returned phone call.   Talbot Grumbling, RN

## 2022-07-10 NOTE — Telephone Encounter (Signed)
Patient calls requesting her order for her incontinence supplies. Told patient we have not received anything from [facility] regarding these supplies. Patient states she needs them and would like to be able to get them at the same time as her walker/cane. Please update patient with this process.

## 2022-07-10 NOTE — Telephone Encounter (Signed)
Patient calls nurse line in regards to incontinence supplies and DME orders.  However, I do not see where we have a recent office visit note for supplies. Spoke with [employer] and they have "no record of supply order for her."   Patient scheduled for ATC on 8/8.  Will discuss walker and cane at visit as well.   All DME orders will need to be documented in office note.

## 2022-07-15 NOTE — Progress Notes (Unsigned)
    SUBJECTIVE:   CHIEF COMPLAINT / HPI:   Urinary incontinence Pt states she has urinary incontinence at night. Thinks this is d/t her kidney function however kidney function has been fine in the past.  This is a chronic issue for her and she has received incontinence supplies previously.  Needs pampers and chux pads.  She does admit to some dysuria over the last few days but no other symptoms such as suprapubic pain or increased frequency.    Issues with ambulation Patient is here requesting a rolling walker with seat.  States she has issues with ambulation due to chronic pain.  Per chart review, She was ordered a rolling walker with a seat a few years ago but she states her seat recently broke.  She comes in today using her cane.  PERTINENT  PMH / PSH: Urinary incontinence, osteoarthritis, chronic pain syndrome  OBJECTIVE:   Vitals:   07/16/22 0830  BP: (!) 128/93  Pulse: 74  SpO2: 97%     General: NAD, pleasant, able to participate in exam Cardiac: RRR, no murmurs. Respiratory: CTAB, normal effort, No wheezes, rales or rhonchi Abdomen: Bowel sounds present, nontender, nondistended, soft Skin: warm and dry, no rashes noted Neuro: alert, no obvious focal deficits Psych: Normal affect and mood  ASSESSMENT/PLAN:   Chronic pain syndrome -Rolling walker with seat ordered  Urinary incontinence - Urinary incontinence supplies ordered -We will check UA to rule out UTI as cause of dysuria and check BMP to assess kidney function as she has not had BMP recently and she is under the impression her urinary incontinence is due to kidney function.  I am not sure the etiology of her urinary incontinence however it occurs mostly at nighttime and is chronic in nature.     Dr. Precious Gilding, [place]

## 2022-07-16 ENCOUNTER — Ambulatory Visit (INDEPENDENT_AMBULATORY_CARE_PROVIDER_SITE_OTHER): Payer: Medicaid Other | Admitting: Student

## 2022-07-16 VITALS — BP 128/93 | HR 74 | Ht 67.0 in | Wt 157.1 lb

## 2022-07-16 DIAGNOSIS — M159 Polyosteoarthritis, unspecified: Secondary | ICD-10-CM

## 2022-07-16 DIAGNOSIS — G894 Chronic pain syndrome: Secondary | ICD-10-CM

## 2022-07-16 DIAGNOSIS — R32 Unspecified urinary incontinence: Secondary | ICD-10-CM | POA: Diagnosis not present

## 2022-07-16 NOTE — Assessment & Plan Note (Signed)
-  Rolling walker with seat ordered

## 2022-07-16 NOTE — Assessment & Plan Note (Addendum)
-   Urinary incontinence supplies ordered -We will check UA to rule out UTI as cause of dysuria and check BMP to assess kidney function as she has not had BMP recently and she is under the impression her urinary incontinence is due to kidney function.  I am not sure the etiology of her urinary incontinence however it occurs mostly at nighttime and is chronic in nature.

## 2022-07-16 NOTE — Patient Instructions (Signed)
It was great to see you! Thank you for allowing me to participate in your care!   Our plans for today:  - Your incontinence supplies and rolling walker with a seat were ordered - To schedule an appointment with Dr. Jenne Campus the nutritionist, please call her.   We are checking some labs today, I will call you if they are abnormal will send you a MyChart message or a letter if they are normal.  If you do not hear about your labs in the next 2 weeks please let us know.  Take care and seek immediate care sooner if you develop any concerns.   Dr. Precious Gilding, DO [facility]

## 2022-07-17 ENCOUNTER — Encounter: Payer: Self-pay | Admitting: Student

## 2022-07-17 LAB — BASIC METABOLIC PANEL
BUN/Creatinine Ratio: 16 (ref 12–28)
BUN: 14 mg/dL (ref 8–27)
CO2: 23 mmol/L (ref 20–29)
Calcium: 10.4 mg/dL — ABNORMAL HIGH (ref 8.7–10.3)
Chloride: 104 mmol/L (ref 96–106)
Creatinine, Ser: 0.9 mg/dL (ref 0.57–1.00)
Glucose: 83 mg/dL (ref 70–99)
Potassium: 4.6 mmol/L (ref 3.5–5.2)
Sodium: 144 mmol/L (ref 134–144)
eGFR: 72 mL/min/{1.73_m2} (ref >59)

## 2022-07-17 LAB — URINALYSIS
Bilirubin, UA: NEGATIVE
Glucose, UA: NEGATIVE
Ketones, UA: NEGATIVE
Leukocytes,UA: NEGATIVE
Nitrite, UA: NEGATIVE
Protein,UA: NEGATIVE
RBC, UA: NEGATIVE
Specific Gravity, UA: 1.024 (ref 1.005–1.030)
Urobilinogen, Ur: 0.2 mg/dL (ref 0.2–1.0)
pH, UA: 5.5 (ref 5.0–7.5)

## 2022-07-18 NOTE — Telephone Encounter (Signed)
Community message sent to [employer] for processing. We await confirmation response.

## 2022-07-22 ENCOUNTER — Other Ambulatory Visit: Payer: Self-pay | Admitting: Family Medicine

## 2022-07-22 ENCOUNTER — Other Ambulatory Visit: Payer: Self-pay | Admitting: Student

## 2022-07-22 NOTE — Telephone Encounter (Signed)
Patient returns call to office regarding rolling walker.   Community message sent to [employer] for update.   Talbot Grumbling, RN

## 2022-07-22 NOTE — Telephone Encounter (Signed)
Patient calls nurse line regarding issues with incontinence supplies.   Paperwork was faxed to [facility], however, not to [employer].  Attached office visit note from 07/16/22 and faxed to [employer] at [phone].  Copy of CMN made and placed in batch scanning.   Talbot Grumbling, RN

## 2022-07-23 ENCOUNTER — Other Ambulatory Visit: Payer: Self-pay | Admitting: Student

## 2022-07-23 DIAGNOSIS — Z1231 Encounter for screening mammogram for malignant neoplasm of breast: Secondary | ICD-10-CM

## 2022-07-25 NOTE — Progress Notes (Cosign Needed)
  SUBJECTIVE:   CHIEF COMPLAINT / HPI:   Medical forms: Presents today requesting discussion involving handicap placard and personal care services form.  She is unable to walk without the use of her assistance from a brace, cane, crutch or other assistive device.  Additionally, she is currently receiving assistance with meals, getting into the bathtub, using medical devices by an RN 40 hours a month but used to receive 80 hours a month.  She is able to get up and move minimally but unable to drive and gets assistance from a driver or takes public transportation if she has to.  Recently had form filled out for rolling walker with a seat for her chronic pain syndrome.  She sees orthopedics for her osteoarthritis.  PERTINENT  PMH / PSH: HTN, COPD, restrictive lung disease, chronic pain syndrome, osteoarthritis  OBJECTIVE:  BP 132/80   Pulse 77   Wt 159 lb (72.1 kg)   SpO2 96%   BMI 24.90 kg/m   General: NAD, pleasant but in a large to leave her driver calling her several times on the cell phone Psych: Normal affect and mood  ASSESSMENT/PLAN:  Healthcare maintenance Handicap placard and PCS medical need form filled out for increased care hours back to 80 hours monthly as opposed to the 40 hours monthly that she is currently receiving which was decreased at some time.  This is for need for her limitations related to COPD and chronic pain syndrome.  Return if symptoms worsen or fail to improve. Wells Guiles, DO 07/26/2022, 2:34 PM PGY-2, [place]

## 2022-07-26 ENCOUNTER — Ambulatory Visit (INDEPENDENT_AMBULATORY_CARE_PROVIDER_SITE_OTHER): Payer: Medicaid Other | Admitting: Student

## 2022-07-26 VITALS — BP 132/80 | HR 77 | Wt 159.0 lb

## 2022-07-26 DIAGNOSIS — Z0000 Encounter for general adult medical examination without abnormal findings: Secondary | ICD-10-CM | POA: Diagnosis not present

## 2022-07-26 NOTE — Telephone Encounter (Signed)
Patient confirmed she received all supplies at office visit today.

## 2022-07-26 NOTE — Assessment & Plan Note (Addendum)
Handicap placard and PCS medical need form filled out for increased care hours back to 80 hours monthly as opposed to the 40 hours monthly that she is currently receiving which was decreased at some time. Handicap placard necessary for use of cane or walker for safe transportation. PCS form for need of her limitations related to COPD and chronic pain syndrome.

## 2022-07-26 NOTE — Patient Instructions (Signed)
Not performed as patient left due to needing a ride back home and did not want to be stranded here to take public transportation

## 2022-07-31 ENCOUNTER — Telehealth: Payer: Self-pay

## 2022-07-31 NOTE — Telephone Encounter (Signed)
PCS form completed by provider and faxed to [facility].   A copy was made for batch scanning.   DMV placard filled out and placed up front for pick up. Copy made for batch scanning.   Patient aware.

## 2022-08-05 ENCOUNTER — Other Ambulatory Visit: Payer: Self-pay | Admitting: Student

## 2022-08-05 ENCOUNTER — Other Ambulatory Visit: Payer: Self-pay | Admitting: *Deleted

## 2022-08-05 DIAGNOSIS — I10 Essential (primary) hypertension: Secondary | ICD-10-CM

## 2022-08-05 MED ORDER — AMLODIPINE BESYLATE 10 MG PO TABS: ORAL_TABLET | ORAL | 2 refills | Status: DC

## 2022-08-05 MED ORDER — HYDROCHLOROTHIAZIDE 25 MG PO TABS: ORAL_TABLET | ORAL | 1 refills | Status: DC

## 2022-08-06 ENCOUNTER — Other Ambulatory Visit: Payer: Self-pay

## 2022-08-06 ENCOUNTER — Encounter (HOSPITAL_COMMUNITY): Payer: Self-pay | Admitting: Orthopedic Surgery

## 2022-08-06 NOTE — Progress Notes (Signed)
Rhonda Dominguez denies chest pain or shortness of breath.  Patient denies having any s/s of Covid in her household, also denies any known exposure to Covid.   Rhonda Dominguez is seen in [facility].  Rhonda Dominguez is pre-diabetic, she was taking medication, but no longer does, she has restricted her diet and walks more. Patient does not have  a CBG machine.  I spoke with [name], patient 's cousin that will be driving Rhonda Dominguez home, Rhonda Dominguez said that if patient's son can not stay with her that she will stay with Rhonda Dominguez the first 24 hours after surgery.

## 2022-08-07 ENCOUNTER — Encounter (HOSPITAL_COMMUNITY): Payer: Self-pay | Admitting: Orthopedic Surgery

## 2022-08-07 ENCOUNTER — Ambulatory Visit (HOSPITAL_COMMUNITY)
Admission: RE | Admit: 2022-08-07 | Discharge: 2022-08-07 | Disposition: A | Discharge status: Home / Self Care | Payer: Medicaid Other | Attending: Orthopedic Surgery | Admitting: Orthopedic Surgery

## 2022-08-07 ENCOUNTER — Ambulatory Visit (HOSPITAL_BASED_OUTPATIENT_CLINIC_OR_DEPARTMENT_OTHER): Payer: Medicaid Other | Admitting: Certified Registered"

## 2022-08-07 ENCOUNTER — Other Ambulatory Visit: Payer: Self-pay

## 2022-08-07 ENCOUNTER — Encounter (HOSPITAL_COMMUNITY)
Admission: RE | Disposition: A | Discharge status: Home / Self Care | Payer: Self-pay | Source: Home / Self Care | Attending: Orthopedic Surgery

## 2022-08-07 ENCOUNTER — Ambulatory Visit (HOSPITAL_COMMUNITY): Payer: Medicaid Other

## 2022-08-07 ENCOUNTER — Ambulatory Visit (HOSPITAL_COMMUNITY): Payer: Medicaid Other | Admitting: Certified Registered"

## 2022-08-07 DIAGNOSIS — J449 Chronic obstructive pulmonary disease, unspecified: Secondary | ICD-10-CM

## 2022-08-07 DIAGNOSIS — Z87891 Personal history of nicotine dependence: Secondary | ICD-10-CM | POA: Diagnosis not present

## 2022-08-07 DIAGNOSIS — M19071 Primary osteoarthritis, right ankle and foot: Secondary | ICD-10-CM | POA: Insufficient documentation

## 2022-08-07 DIAGNOSIS — I10 Essential (primary) hypertension: Secondary | ICD-10-CM

## 2022-08-07 DIAGNOSIS — M2021 Hallux rigidus, right foot: Secondary | ICD-10-CM

## 2022-08-07 DIAGNOSIS — Z79899 Other long term (current) drug therapy: Secondary | ICD-10-CM | POA: Insufficient documentation

## 2022-08-07 DIAGNOSIS — M25774 Osteophyte, right foot: Secondary | ICD-10-CM | POA: Insufficient documentation

## 2022-08-07 DIAGNOSIS — F319 Bipolar disorder, unspecified: Secondary | ICD-10-CM | POA: Insufficient documentation

## 2022-08-07 HISTORY — DX: Prediabetes: R73.03

## 2022-08-07 HISTORY — DX: Inflammatory liver disease, unspecified: K75.9

## 2022-08-07 HISTORY — DX: Bipolar disorder, unspecified: F31.9

## 2022-08-07 HISTORY — PX: ARTHRODESIS METATARSALPHALANGEAL JOINT (MTPJ): SHX6566

## 2022-08-07 LAB — CBC
HCT: 37.4 % (ref 36.0–46.0)
Hemoglobin: 12.1 g/dL (ref 12.0–15.0)
MCH: 26.4 pg (ref 26.0–34.0)
MCHC: 32.4 g/dL (ref 30.0–36.0)
MCV: 81.5 fL (ref 80.0–100.0)
Platelets: 225 10*3/uL (ref 150–400)
RBC: 4.59 MIL/uL (ref 3.87–5.11)
RDW: 15.9 % — ABNORMAL HIGH (ref 11.5–15.5)
WBC: 7.3 10*3/uL (ref 4.0–10.5)
nRBC: 0 % (ref 0.0–0.2)

## 2022-08-07 SURGERY — FUSION, JOINT, GREAT TOE
Anesthesia: General | Site: Toe | Laterality: Right

## 2022-08-07 MED ORDER — ONDANSETRON HCL 4 MG/2ML IJ SOLN
INTRAMUSCULAR | Status: AC
  Filled 2022-08-07: qty 2

## 2022-08-07 MED ORDER — 0.9 % SODIUM CHLORIDE (POUR BTL) OPTIME
TOPICAL | Status: DC | PRN
  Administered 2022-08-07: 1000 mL

## 2022-08-07 MED ORDER — FENTANYL CITRATE (PF) 100 MCG/2ML IJ SOLN
25.0000 ug | INTRAMUSCULAR | Status: DC | PRN
  Administered 2022-08-07: 50 ug via INTRAVENOUS

## 2022-08-07 MED ORDER — MIDAZOLAM HCL 2 MG/2ML IJ SOLN
INTRAMUSCULAR | Status: AC
  Filled 2022-08-07: qty 2

## 2022-08-07 MED ORDER — FENTANYL CITRATE (PF) 100 MCG/2ML IJ SOLN
INTRAMUSCULAR | Status: AC
  Filled 2022-08-07: qty 2

## 2022-08-07 MED ORDER — PROMETHAZINE HCL 25 MG/ML IJ SOLN: 6.2500 mg | INTRAMUSCULAR | Status: DC | PRN

## 2022-08-07 MED ORDER — PROPOFOL 10 MG/ML IV BOLUS
INTRAVENOUS | Status: AC
  Filled 2022-08-07: qty 20

## 2022-08-07 MED ORDER — OXYCODONE HCL 5 MG/5ML PO SOLN: 5.0000 mg | Freq: Once | ORAL | Status: AC | PRN

## 2022-08-07 MED ORDER — CEFAZOLIN SODIUM-DEXTROSE 2-4 GM/100ML-% IV SOLN
2.0000 g | INTRAVENOUS | Status: AC
  Administered 2022-08-07: 2 g via INTRAVENOUS
  Filled 2022-08-07: qty 100

## 2022-08-07 MED ORDER — ACETAMINOPHEN 10 MG/ML IV SOLN: 1000.0000 mg | Freq: Once | INTRAVENOUS | Status: DC | PRN

## 2022-08-07 MED ORDER — FENTANYL CITRATE (PF) 250 MCG/5ML IJ SOLN
INTRAMUSCULAR | Status: DC | PRN
  Administered 2022-08-07 (×2): 25 ug via INTRAVENOUS
  Administered 2022-08-07: 50 ug via INTRAVENOUS

## 2022-08-07 MED ORDER — LIDOCAINE 2% (20 MG/ML) 5 ML SYRINGE
INTRAMUSCULAR | Status: DC | PRN
  Administered 2022-08-07: 60 mg via INTRAVENOUS

## 2022-08-07 MED ORDER — LIDOCAINE 2% (20 MG/ML) 5 ML SYRINGE
INTRAMUSCULAR | Status: AC
  Filled 2022-08-07: qty 5

## 2022-08-07 MED ORDER — CHLORHEXIDINE GLUCONATE 0.12 % MT SOLN
15.0000 mL | Freq: Once | OROMUCOSAL | Status: AC
Start: 2022-08-07 — End: 2022-08-07
  Administered 2022-08-07: 15 mL via OROMUCOSAL
  Filled 2022-08-07: qty 15

## 2022-08-07 MED ORDER — OXYCODONE-ACETAMINOPHEN 5-325 MG PO TABS: 1.0000 | ORAL_TABLET | ORAL | 0 refills | Status: DC | PRN

## 2022-08-07 MED ORDER — PHENYLEPHRINE 80 MCG/ML (10ML) SYRINGE FOR IV PUSH (FOR BLOOD PRESSURE SUPPORT)
PREFILLED_SYRINGE | INTRAVENOUS | Status: AC
Start: 2022-08-07 — End: ?
  Filled 2022-08-07: qty 10

## 2022-08-07 MED ORDER — LACTATED RINGERS IV SOLN: INTRAVENOUS | Status: DC

## 2022-08-07 MED ORDER — PHENYLEPHRINE 80 MCG/ML (10ML) SYRINGE FOR IV PUSH (FOR BLOOD PRESSURE SUPPORT)
PREFILLED_SYRINGE | INTRAVENOUS | Status: DC | PRN
  Administered 2022-08-07 (×5): 80 ug via INTRAVENOUS

## 2022-08-07 MED ORDER — AMISULPRIDE (ANTIEMETIC) 5 MG/2ML IV SOLN: 10.0000 mg | Freq: Once | INTRAVENOUS | Status: DC | PRN

## 2022-08-07 MED ORDER — ONDANSETRON HCL 4 MG/2ML IJ SOLN
INTRAMUSCULAR | Status: DC | PRN
  Administered 2022-08-07: 4 mg via INTRAVENOUS

## 2022-08-07 MED ORDER — OXYCODONE HCL 5 MG PO TABS
ORAL_TABLET | ORAL | Status: AC
  Filled 2022-08-07: qty 1

## 2022-08-07 MED ORDER — MIDAZOLAM HCL 2 MG/2ML IJ SOLN
INTRAMUSCULAR | Status: DC | PRN
  Administered 2022-08-07: 1 mg via INTRAVENOUS

## 2022-08-07 MED ORDER — FENTANYL CITRATE (PF) 250 MCG/5ML IJ SOLN
INTRAMUSCULAR | Status: AC
  Filled 2022-08-07: qty 5

## 2022-08-07 MED ORDER — OXYCODONE HCL 5 MG PO TABS
5.0000 mg | ORAL_TABLET | Freq: Once | ORAL | Status: AC | PRN
  Administered 2022-08-07: 5 mg via ORAL

## 2022-08-07 MED ORDER — PROPOFOL 10 MG/ML IV BOLUS
INTRAVENOUS | Status: DC | PRN
  Administered 2022-08-07: 200 mg via INTRAVENOUS

## 2022-08-07 MED ORDER — ORAL CARE MOUTH RINSE
15.0000 mL | Freq: Once | OROMUCOSAL | Status: AC
Start: 2022-08-07 — End: 2022-08-07

## 2022-08-07 SURGICAL SUPPLY — 59 items
BAG COUNTER SPONGE SURGICOUNT (BAG) ×1 IMPLANT
BIT DRILL 2.0X130 SLD AO (BIT) IMPLANT
BLADE AVERAGE 25X9 (BLADE) IMPLANT
BLADE SAW SGTL NAR THIN XSHT (BLADE) IMPLANT
BNDG COHESIVE 1X5 TAN STRL LF (GAUZE/BANDAGES/DRESSINGS) IMPLANT
BNDG COHESIVE 4X5 TAN STRL LF (GAUZE/BANDAGES/DRESSINGS) IMPLANT
BNDG COHESIVE 6X5 TAN STRL LF (GAUZE/BANDAGES/DRESSINGS) IMPLANT
BNDG ESMARK 4X9 LF (GAUZE/BANDAGES/DRESSINGS) ×1 IMPLANT
BNDG GAUZE DERMACEA FLUFF 4 (GAUZE/BANDAGES/DRESSINGS) IMPLANT
CORD BIPOLAR FORCEPS 12FT (ELECTRODE) ×1 IMPLANT
COTTON STERILE ROLL (GAUZE/BANDAGES/DRESSINGS) IMPLANT
COVER SURGICAL LIGHT HANDLE (MISCELLANEOUS) ×2 IMPLANT
CUFF TOURN SGL QUICK 24 (TOURNIQUET CUFF)
CUFF TOURN SGL QUICK 34 (TOURNIQUET CUFF)
CUFF TOURN SGL QUICK 42 (TOURNIQUET CUFF) IMPLANT
CUFF TRNQT CYL 24X4X16.5-23 (TOURNIQUET CUFF) IMPLANT
CUFF TRNQT CYL 34X4.125X (TOURNIQUET CUFF) IMPLANT
DRAPE OEC MINIVIEW 54X84 (DRAPES) IMPLANT
DRAPE U-SHAPE 47X51 STRL (DRAPES) ×1 IMPLANT
DRSG ADAPTIC 3X8 NADH LF (GAUZE/BANDAGES/DRESSINGS) IMPLANT
DRSG EMULSION OIL 3X3 NADH (GAUZE/BANDAGES/DRESSINGS) IMPLANT
DRSG PAD ABDOMINAL 8X10 ST (GAUZE/BANDAGES/DRESSINGS) IMPLANT
DURAPREP 26ML APPLICATOR (WOUND CARE) ×1 IMPLANT
ELECT REM PT RETURN 9FT ADLT (ELECTROSURGICAL) ×1
ELECTRODE REM PT RTRN 9FT ADLT (ELECTROSURGICAL) ×1 IMPLANT
GAUZE SPONGE 2X2 8PLY STRL LF (GAUZE/BANDAGES/DRESSINGS) IMPLANT
GAUZE SPONGE 4X4 12PLY STRL (GAUZE/BANDAGES/DRESSINGS) IMPLANT
GLOVE BIOGEL PI IND STRL 9 (GLOVE) ×1 IMPLANT
GLOVE BIOGEL PI INDICATOR 9 (GLOVE) ×1
GLOVE SURG ORTHO 9.0 STRL STRW (GLOVE) ×1 IMPLANT
GOWN STRL REUS W/ TWL XL LVL3 (GOWN DISPOSABLE) ×2 IMPLANT
GOWN STRL REUS W/TWL XL LVL3 (GOWN DISPOSABLE) ×2
K-WIRE SMOOTH 1.6X150MM (WIRE) ×2
KIT BASIN OR (CUSTOM PROCEDURE TRAY) ×1 IMPLANT
KIT TURNOVER KIT B (KITS) ×1 IMPLANT
KWIRE SMOOTH 1.6X150MM (WIRE) IMPLANT
MANIFOLD NEPTUNE II (INSTRUMENTS) ×1 IMPLANT
MTP PLATE SMALL R 5 DEGREE (Plate) IMPLANT
NDL HYPO 25GX1X1/2 BEV (NEEDLE) IMPLANT
NEEDLE HYPO 25GX1X1/2 BEV (NEEDLE) IMPLANT
NS IRRIG 1000ML POUR BTL (IV SOLUTION) ×1 IMPLANT
PACK ORTHO EXTREMITY (CUSTOM PROCEDURE TRAY) ×1 IMPLANT
PAD ARMBOARD 7.5X6 YLW CONV (MISCELLANEOUS) ×2 IMPLANT
PAD CAST 4YDX4 CTTN HI CHSV (CAST SUPPLIES) IMPLANT
PADDING CAST COTTON 4X4 STRL (CAST SUPPLIES)
SCREW LOCK PLATE R3 2.7X14 (Screw) IMPLANT
SCREW LOCK PLATE R3 2.7X15 (Screw) IMPLANT
SCREW LOCK PLATE R3 2.7X16 (Screw) IMPLANT
SCREW NON LOCK PLATE 2.7X16M (Screw) IMPLANT
SUCTION FRAZIER HANDLE 10FR (MISCELLANEOUS)
SUCTION TUBE FRAZIER 10FR DISP (MISCELLANEOUS) IMPLANT
SUT ETHILON 2 0 PSLX (SUTURE) ×2 IMPLANT
SUT VIC AB 2-0 CT1 27 (SUTURE) ×1
SUT VIC AB 2-0 CT1 TAPERPNT 27 (SUTURE) ×1 IMPLANT
SYR CONTROL 10ML LL (SYRINGE) IMPLANT
TOWEL GREEN STERILE (TOWEL DISPOSABLE) ×1 IMPLANT
TOWEL GREEN STERILE FF (TOWEL DISPOSABLE) ×1 IMPLANT
TUBE CONNECTING 12X1/4 (SUCTIONS) IMPLANT
WATER STERILE IRR 1000ML POUR (IV SOLUTION) ×1 IMPLANT

## 2022-08-07 NOTE — Anesthesia Preprocedure Evaluation (Addendum)
Anesthesia Evaluation  Patient identified by MRN, date of birth, ID band Patient awake    Reviewed: Allergy & Precautions, NPO status , Patient's Chart, lab work & pertinent test results  Airway Mallampati: II  TM Distance: >3 FB Neck ROM: Full    Dental  (+) Edentulous Upper, Edentulous Lower   Pulmonary COPD,  COPD inhaler, former smoker,    Pulmonary exam normal        Cardiovascular hypertension, Pt. on medications Normal cardiovascular exam     Neuro/Psych PSYCHIATRIC DISORDERS Depression Bipolar Disorder  Neuromuscular disease    GI/Hepatic negative GI ROS, (+)     substance abuse  , Hepatitis -  Endo/Other  negative endocrine ROS  Renal/GU negative Renal ROS     Musculoskeletal  (+) Arthritis ,   Abdominal   Peds  Hematology negative hematology ROS (+)   Anesthesia Other Findings Hallux Rigidus Right Great Toe  Reproductive/Obstetrics                            Anesthesia Physical Anesthesia Plan  ASA: 3  Anesthesia Plan: General   Post-op Pain Management:    Induction: Intravenous  PONV Risk Score and Plan: 3 and Ondansetron, Dexamethasone, Midazolam and Treatment may vary due to age or medical condition  Airway Management Planned: LMA  Additional Equipment:   Intra-op Plan:   Post-operative Plan: Extubation in OR  Informed Consent: I have reviewed the patients History and Physical, chart, labs and discussed the procedure including the risks, benefits and alternatives for the proposed anesthesia with the patient or authorized representative who has indicated his/her understanding and acceptance.     Dental advisory given  Plan Discussed with: CRNA  Anesthesia Plan Comments:         Anesthesia Quick Evaluation

## 2022-08-07 NOTE — Anesthesia Procedure Notes (Signed)
Procedure Name: LMA Insertion Date/Time: 08/07/2022 7:31 AM  Performed by: Cathren Harsh, CRNAPre-anesthesia Checklist: Patient identified, Emergency Drugs available, Suction available and Patient being monitored Patient Re-evaluated:Patient Re-evaluated prior to induction Oxygen Delivery Method: Circle System Utilized Preoxygenation: Pre-oxygenation with 100% oxygen Induction Type: IV induction Ventilation: Mask ventilation without difficulty LMA: LMA inserted LMA Size: 4.0 Number of attempts: 1 Airway Equipment and Method: Bite block Placement Confirmation: positive ETCO2 Tube secured with: Tape Dental Injury: Teeth and Oropharynx as per pre-operative assessment

## 2022-08-07 NOTE — Progress Notes (Signed)
Orthopedic Tech Progress Note Patient Details:  Rhonda Dominguez 1961-12-03 [id number]  PACU RN called requesting Post OP shoe and crutches.  Ortho Devices Type of Ortho Device: Crutches, Postop shoe/boot Ortho Device/Splint Location: RLE Ortho Device/Splint Interventions: Ordered, Application, Adjustment   Post Interventions Patient Tolerated: Well, Difficulty with ambulation Instructions Provided: Care of device  Arville Go 08/07/2022, 9:15 AM

## 2022-08-07 NOTE — Anesthesia Postprocedure Evaluation (Signed)
Anesthesia Post Note  Patient: Rhonda Dominguez  Procedure(s) Performed: FUSION RIGHT GREAT TOE METATARSOPHALANGEAL JOINT (Right: Toe)     Patient location during evaluation: PACU Anesthesia Type: General Level of consciousness: awake Pain management: pain level controlled Vital Signs Assessment: post-procedure vital signs reviewed and stable Respiratory status: spontaneous breathing, nonlabored ventilation, respiratory function stable and patient connected to nasal cannula oxygen Cardiovascular status: blood pressure returned to baseline and stable Postop Assessment: no apparent nausea or vomiting Anesthetic complications: no   No notable events documented.  Last Vitals:  Vitals:   08/07/22 0830 08/07/22 0845  BP: 134/88 131/75  Pulse: 76 (!) 51  Resp: 18 14  Temp:  36.6 C  SpO2: 98% 94%    Last Pain:  Vitals:   08/07/22 0845  TempSrc:   PainSc: 9                  Rhonda Dominguez

## 2022-08-07 NOTE — Transfer of Care (Signed)
Immediate Anesthesia Transfer of Care Note  Patient: Rhonda Dominguez  Procedure(s) Performed: FUSION RIGHT GREAT TOE METATARSOPHALANGEAL JOINT (Right: Toe)  Patient Location: PACU  Anesthesia Type:General  Level of Consciousness: drowsy, patient cooperative and responds to stimulation  Airway & Oxygen Therapy: Patient Spontanous Breathing and Patient connected to face mask oxygen  Post-op Assessment: Report given to RN and Post -op Vital signs reviewed and stable  Post vital signs: Reviewed and stable  Last Vitals:  Vitals Value Taken Time  BP    Temp    Pulse 62 08/07/22 0826  Resp 11 08/07/22 0826  SpO2 98 % 08/07/22 0826  Vitals shown include unvalidated device data.  Last Pain:  Vitals:   08/07/22 0622  TempSrc:   PainSc: 0-No pain         Complications: No notable events documented.

## 2022-08-07 NOTE — Op Note (Signed)
08/07/2022  8:27 AM  PATIENT:  Rhonda Dominguez    PRE-OPERATIVE DIAGNOSIS:  Hallux Rigidus Right Great Toe  POST-OPERATIVE DIAGNOSIS:  Same  PROCEDURE:  FUSION RIGHT GREAT TOE METATARSOPHALANGEAL JOINT  SURGEON:  Newt Minion, MD  PHYSICIAN ASSISTANT:None ANESTHESIA:   General  PREOPERATIVE INDICATIONS:  [name] is a  61 y.o. female with a diagnosis of Hallux Rigidus Right Great Toe who failed conservative measures and elected for surgical management.    The risks benefits and alternatives were discussed with the patient preoperatively including but not limited to the risks of infection, bleeding, nerve injury, cardiopulmonary complications, the need for revision surgery, among others, and the patient was willing to proceed.  OPERATIVE IMPLANTS: [employer] MTP fusion plate small 5 degree.  '@ENCIMAGES'$ @  OPERATIVE FINDINGS: Patient alignment was stable good first webspace spacing consistent with the other toes.  OPERATIVE PROCEDURE: Patient was brought the operating room and underwent a general anesthetic.  After adequate levels anesthesia obtained patient's right lower extremity was prepped using DuraPrep draped into a sterile field a timeout was called.  A medial longitudinal incision was made over the MTP joint this was carried down through the retinaculum which was elevated.  The osteophytic bone spurs were resected from the MTP joint.  A guidewire was inserted into the metatarsal and the 19 mm cup reamer was used to ream the metatarsal head back to bleeding viable subchondral bone.  The oscillating saw was used to further resect the osteophytic bone spurs on the margins.  Attention was then focused on the base of the proximal phalanx the guidewire was inserted and the cone reamer 19 mm was reamed into the base of the proximal phalanx back to bleeding viable subchondral bone.  Further resection of osteophytic bone spurs were removed the wound was irrigated normal saline the  joint was reduced the webspace was consistent with the webspaces of the lesser toes and the plate was applied dorsall with a compression screw placed proximally.  3 locking screws were then placed distally the proximal compression screw was released the joint was compressed the first webspace was aligned and the proximal screw was compressed.  2 more locking screws were placed proximally.  Visualization showed stable alignment the wound was irrigated normal saline the retinaculum was closed using 2-0 Vicryl the skin was closed using 2-0 nylon sterile dressing was applied patient was extubated taken the PACU in stable condition.   DISCHARGE PLANNING:  Antibiotic duration: Preoperative antibiotics  Weightbearing: Touchdown weightbearing on the right  Pain medication: Prescription for Percocet  Dressing care/ Wound VAC: Dry dressing  Ambulatory devices: Crutches  Discharge to: Home.  Follow-up: In the office 1 week post operative.

## 2022-08-07 NOTE — H&P (Signed)
CIRCE CHILTON is an 61 y.o. female.   Chief Complaint: Pain right great toe HPI: Is a 61 year old woman who presents with degenerative arthritis right great toe MTP joint.  Patient has had previous bunion surgery she has also had steroid injections without relief.  Past Medical History:  Diagnosis Date   Bipolar disorder ([place])    Depression    Drug abuse ([place])    Hepatitis    Hypertension    Pneumothorax    Pre-diabetes     Past Surgical History:  Procedure Laterality Date   CATARACT EXTRACTION Bilateral     History reviewed. No pertinent family history. Social History:  reports that she quit smoking about 3 years ago. Her smoking use included cigarettes. She started smoking about 43 years ago. She has a 40.00 pack-year smoking history. She has been exposed to tobacco smoke. She has never used smokeless tobacco. She reports that she does not drink alcohol and does not use drugs.  Allergies: No Known Allergies  Medications Prior to Admission  Medication Sig Dispense Refill   amLODipine (NORVASC) 10 MG tablet TAKE 1 TABLET(10 MG) BY MOUTH DAILY FOR BLOOD PRESSURE 90 tablet 2   gabapentin (NEURONTIN) 300 MG capsule Take 1 capsule (300 mg total) by mouth 3 (three) times daily as needed. (Patient taking differently: Take 300 mg by mouth every morning.) 180 capsule 1   hydrochlorothiazide (HYDRODIURIL) 25 MG tablet TAKE 1 TABLET(25 MG) BY MOUTH DAILY FOR BLOOD PRESSURE 90 tablet 1   hydrocortisone 2.5 % cream APPLY TOPICALLY TWICE DAILY 453.6 g 1   hydrOXYzine (ATARAX) 25 MG tablet TAKE 1 TABLET(25 MG) BY MOUTH EVERY 8 HOURS AS NEEDED FOR ITCHING (Patient taking differently: Take 25 mg by mouth every 8 (eight) hours as needed for itching.) 90 tablet 2   polyethylene glycol powder (GLYCOLAX/MIRALAX) 17 GM/SCOOP powder Take 17 g by mouth daily.     PROAIR HFA 108 (90 Base) MCG/ACT inhaler Inhale 2 puffs into the lungs every 6 (six) hours as needed for wheezing or shortness of breath. 8.5  g 0   triamcinolone cream (KENALOG) 0.1 % APPLY TOPICALLY TO THE AFFECTED AREA FOUR TIMES DAILY AS NEEDED (Patient taking differently: Apply 1 Application topically 2 (two) times daily.) 45 g 2   DIPHENHYDRAMINE HCL, TOPICAL, (BENADRYL ITCH STOPPING) 2 % GEL Apply 1 application topically 3 (three) times daily as needed. (Patient not taking: Reported on 08/05/2022) 118 mL 0   metroNIDAZOLE (FLAGYL) 500 MG tablet Take 1 tablet (500 mg total) by mouth 2 (two) times daily. (Patient not taking: Reported on 08/05/2022) 14 tablet 0   senna (SENOKOT) 8.6 MG TABS tablet Take 1 tablet (8.6 mg total) by mouth daily as needed for mild constipation. For constipation (Patient not taking: Reported on 08/05/2022) 60 tablet 0    Results for orders placed or performed during the hospital encounter of 08/07/22 (from the past 48 hour(s))  CBC per protocol     Status: Abnormal   Collection Time: 08/07/22  6:30 AM  Result Value Ref Range   WBC 7.3 4.0 - 10.5 K/uL   RBC 4.59 3.87 - 5.11 MIL/uL   Hemoglobin 12.1 12.0 - 15.0 g/dL   HCT 37.4 36.0 - 46.0 %   MCV 81.5 80.0 - 100.0 fL   MCH 26.4 26.0 - 34.0 pg   MCHC 32.4 30.0 - 36.0 g/dL   RDW 15.9 (H) 11.5 - 15.5 %   Platelets 225 150 - 400 K/uL   nRBC 0.0 0.0 -  0.2 %    Comment: Performed at [facility], [place] [address], [place], [place] [address]   No results found.  Review of Systems  All other systems reviewed and are negative.   Blood pressure (!) 134/92, pulse 69, temperature 98 F (36.7 C), temperature source Oral, resp. rate 17, height '5\' 7"'$  (1.702 m), weight 71.2 kg, SpO2 97 %. Physical Exam  Patient is alert, oriented, no adenopathy, well-dressed, normal affect, normal respiratory effort. Examination patient has a good palpable pulse.  She has essentially no range of motion of the great toe MTP joint there is a previous dorsal incision over the MTP joint from her reported bunion surgery but I do not see indications of medial eminence  resection. Assessment/Plan 1. Osteoarthritis of joint of toe of right foot   2. Hallux rigidus, right foot       Plan: Discussed nonoperative treatment with stiff soled shoe versus fusion.  Patient states she would like to proceed with fusion in August of the MTP joint.  Risk and benefits were discussed patient states she understands wished to proceed at this time.  Newt Minion, MD 08/07/2022, 6:36 AM

## 2022-08-08 ENCOUNTER — Encounter (HOSPITAL_COMMUNITY): Payer: Self-pay | Admitting: Orthopedic Surgery

## 2022-08-14 ENCOUNTER — Ambulatory Visit (INDEPENDENT_AMBULATORY_CARE_PROVIDER_SITE_OTHER): Payer: Medicaid Other | Admitting: Family

## 2022-08-14 ENCOUNTER — Encounter: Payer: Self-pay | Admitting: Family

## 2022-08-14 DIAGNOSIS — M2021 Hallux rigidus, right foot: Secondary | ICD-10-CM

## 2022-08-14 NOTE — Progress Notes (Signed)
Post-Op Visit Note   Patient: Rhonda Dominguez           Date of Birth: Sep 03, 1961           MRN: [id number] Visit Date: 08/14/2022 PCP: Wells Guiles, DO  Chief Complaint:  Chief Complaint  Patient presents with   Right Foot - Routine Post Op    08/07/2022 right GT MTP joint fusion     HPI:  HPI The patient is a 61 year old woman who presents status post fusion right great toe for hallux rigidus August 30 Ortho Exam On examination of the right foot incision well approximated sutures there is no gaping or drainage no erythema Visit Diagnoses: No diagnosis found.  Plan: Begin daily Dial soap cleansing.  Dry dressings.  Follow-up in 1 week for suture removal as well as radiographs of the right foot  Follow-Up Instructions: No follow-ups on file.   Imaging: No results found.  Orders:  No orders of the defined types were placed in this encounter.  No orders of the defined types were placed in this encounter.    PMFS History: Patient Active Problem List   Diagnosis Date Noted   Hallux rigidus, right foot    Healthcare maintenance 05/28/2022   Prurigo nodularis 12/27/2021   Excessive cerumen in ear canal 08/04/2020   Nutritional counseling 07/03/2020   Hypercalcemia 07/03/2020   GERD (gastroesophageal reflux disease) 06/19/2020   Paronychia of right middle finger 05/19/2020   COPD (chronic obstructive pulmonary disease) ([place]) 12/08/2019   Prediabetes 08/13/2019   Loud snoring 07/01/2019   Neurotic excoriations 05/20/2019   Skin lesions, generalized 10/16/2018   Complex regional pain syndrome type 1 of lower extremity (Right) 11/10/2017   Cocaine abuse ([place]) 11/10/2017   History of tobacco abuse 11/10/2017   Osteoarthritis 11/10/2017   Chronic shoulder pain (Bilateral) (L>R) 11/10/2017   Dextroscoliosis 11/10/2017   DDD (degenerative disc disease), cervical 11/10/2017   Knee pain, chronic (Secondary Area of Pain) (right) 11/09/2017   Great toe pain (Primary Area  of Pain) (Right) 10/23/2017   Chronic lower extremity pain (Tertiary Area of Pain) (Right) 10/23/2017   Chronic pain syndrome 10/23/2017   Urinary incontinence 11/20/2016   Restrictive lung disease 07/23/2016   Essential hypertension 03/22/2016   High risk bisexual behavior 05/18/2015   CATARACTS, BILATERAL 06/24/2007   Bipolar affective disorder ([place]) 10/03/2006   Constipation 10/03/2006   Past Medical History:  Diagnosis Date   Bipolar disorder ([place])    Depression    Drug abuse ([place])    Hepatitis    Hypertension    Pneumothorax    Pre-diabetes     History reviewed. No pertinent family history.  Past Surgical History:  Procedure Laterality Date   ARTHRODESIS METATARSALPHALANGEAL JOINT (MTPJ) Right 08/07/2022   Procedure: FUSION RIGHT GREAT TOE METATARSOPHALANGEAL JOINT;  Surgeon: Newt Minion, MD;  Location: [facility];  Service: Orthopedics;  Laterality: Right;   CATARACT EXTRACTION Bilateral    Social History   Occupational History   Not on file  Tobacco Use   Smoking status: Former    Packs/day: 1.00    Years: 40.00    Total pack years: 40.00    Types: Cigarettes    Start date: 12/09/1978    Quit date: 03/10/2019    Years since quitting: 3.4    Passive exposure: Past   Smokeless tobacco: Never   Tobacco comments:    Smoked 1-2 ppd for 40 years  QUIT 02/2019  Substance and Sexual Activity  Alcohol use: No    Comment: none in 8 years   Drug use: No    Comment: clean 8 years   Sexual activity: Not on file

## 2022-08-15 NOTE — Telephone Encounter (Signed)
Received call from [facility] stating they never received PCS forms.   PCS forms refaxed to alternate number given to me.   [phone]

## 2022-08-19 ENCOUNTER — Other Ambulatory Visit: Payer: Self-pay | Admitting: Student

## 2022-08-20 ENCOUNTER — Ambulatory Visit: Payer: Self-pay

## 2022-08-20 ENCOUNTER — Encounter: Payer: Self-pay | Admitting: Family

## 2022-08-20 ENCOUNTER — Telehealth: Payer: Self-pay | Admitting: Student

## 2022-08-20 ENCOUNTER — Ambulatory Visit (INDEPENDENT_AMBULATORY_CARE_PROVIDER_SITE_OTHER): Payer: Medicaid Other | Admitting: Family

## 2022-08-20 DIAGNOSIS — M19071 Primary osteoarthritis, right ankle and foot: Secondary | ICD-10-CM

## 2022-08-20 NOTE — Telephone Encounter (Signed)
Patient is calling to check on the status of her form being completed and faxed to [facility].   I informed patient that [employer] faxed [place] the form back to be redone. When they originally faxed the form we did not receive all parts that needed to be completed.   Patient would like for someone to call her when form is complete. The best call back is [phone]. Please advise.

## 2022-08-20 NOTE — Progress Notes (Signed)
Post-Op Visit Note   Patient: Rhonda Dominguez           Date of Birth: 1961-07-29           MRN: [id number] Visit Date: 08/20/2022 PCP: Wells Guiles, DO  Chief Complaint:  Chief Complaint  Patient presents with   Right Foot - Routine Post Op    08/07/22 right GT MTP joint fusion    HPI:  HPI The patient is a 61 year old woman seen status post right great toe MTP fusion she has been full weightbearing in a postop shoe.  No concerns no pain Ortho Exam On examination of the right foot her incision is well-healed sutures harvested today no gaping drainage no erythema minimal edema  Visit Diagnoses:  1. Osteoarthritis of joint of toe of right foot     Plan: Continue daily Dial soap cleansing dry dressings continue postop shoe weightbearing as tolerated follow-up once more in 2 weeks  Follow-Up Instructions: No follow-ups on file.   Imaging: No results found.  Orders:  Orders Placed This Encounter  Procedures   XR Foot Complete Right   No orders of the defined types were placed in this encounter.    PMFS History: Patient Active Problem List   Diagnosis Date Noted   Hallux rigidus, right foot    Healthcare maintenance 05/28/2022   Prurigo nodularis 12/27/2021   Excessive cerumen in ear canal 08/04/2020   Nutritional counseling 07/03/2020   Hypercalcemia 07/03/2020   GERD (gastroesophageal reflux disease) 06/19/2020   Paronychia of right middle finger 05/19/2020   COPD (chronic obstructive pulmonary disease) ([place]) 12/08/2019   Prediabetes 08/13/2019   Loud snoring 07/01/2019   Neurotic excoriations 05/20/2019   Skin lesions, generalized 10/16/2018   Complex regional pain syndrome type 1 of lower extremity (Right) 11/10/2017   Cocaine abuse ([place]) 11/10/2017   History of tobacco abuse 11/10/2017   Osteoarthritis 11/10/2017   Chronic shoulder pain (Bilateral) (L>R) 11/10/2017   Dextroscoliosis 11/10/2017   DDD (degenerative disc disease), cervical 11/10/2017    Knee pain, chronic (Secondary Area of Pain) (right) 11/09/2017   Great toe pain (Primary Area of Pain) (Right) 10/23/2017   Chronic lower extremity pain (Tertiary Area of Pain) (Right) 10/23/2017   Chronic pain syndrome 10/23/2017   Urinary incontinence 11/20/2016   Restrictive lung disease 07/23/2016   Essential hypertension 03/22/2016   High risk bisexual behavior 05/18/2015   CATARACTS, BILATERAL 06/24/2007   Bipolar affective disorder ([place]) 10/03/2006   Constipation 10/03/2006   Past Medical History:  Diagnosis Date   Bipolar disorder ([place])    Depression    Drug abuse ([place])    Hepatitis    Hypertension    Pneumothorax    Pre-diabetes     History reviewed. No pertinent family history.  Past Surgical History:  Procedure Laterality Date   ARTHRODESIS METATARSALPHALANGEAL JOINT (MTPJ) Right 08/07/2022   Procedure: FUSION RIGHT GREAT TOE METATARSOPHALANGEAL JOINT;  Surgeon: Newt Minion, MD;  Location: [facility];  Service: Orthopedics;  Laterality: Right;   CATARACT EXTRACTION Bilateral    Social History   Occupational History   Not on file  Tobacco Use   Smoking status: Former    Packs/day: 1.00    Years: 40.00    Total pack years: 40.00    Types: Cigarettes    Start date: 12/09/1978    Quit date: 03/10/2019    Years since quitting: 3.4    Passive exposure: Past   Smokeless tobacco: Never   Tobacco comments:  Smoked 1-2 ppd for 40 years  QUIT 02/2019  Substance and Sexual Activity   Alcohol use: No    Comment: none in 8 years   Drug use: No    Comment: clean 8 years   Sexual activity: Not on file

## 2022-08-20 NOTE — Telephone Encounter (Signed)
Reviewed forms. Section D is incomplete. Forms placed in provider box for completion.   Please return to RN team box once completed.   Thanks.  Talbot Grumbling, RN

## 2022-08-22 ENCOUNTER — Other Ambulatory Visit: Payer: Self-pay | Admitting: Student

## 2022-08-22 NOTE — Telephone Encounter (Signed)
Dr Madison Hickman, have you taken care of this?  Please advise. Ottis Stain, CMA

## 2022-08-23 NOTE — Telephone Encounter (Signed)
Form faxed to [facility]. Copy made and placed in batch scanning.   Talbot Grumbling, RN

## 2022-09-05 ENCOUNTER — Ambulatory Visit (INDEPENDENT_AMBULATORY_CARE_PROVIDER_SITE_OTHER): Payer: Medicaid Other | Admitting: Orthopedic Surgery

## 2022-09-05 ENCOUNTER — Ambulatory Visit (INDEPENDENT_AMBULATORY_CARE_PROVIDER_SITE_OTHER): Payer: Medicaid Other

## 2022-09-05 DIAGNOSIS — M79671 Pain in right foot: Secondary | ICD-10-CM

## 2022-09-05 DIAGNOSIS — M2021 Hallux rigidus, right foot: Secondary | ICD-10-CM

## 2022-09-05 MED ORDER — OXYCODONE-ACETAMINOPHEN 5-325 MG PO TABS: 1.0000 | ORAL_TABLET | ORAL | 0 refills | Status: DC | PRN

## 2022-09-09 ENCOUNTER — Telehealth: Payer: Self-pay | Admitting: Student

## 2022-09-09 NOTE — Telephone Encounter (Signed)
Patient called stating she spoke to place and they are needing A,B,C,D completed and they are needing to describe why she needs more hours and she stated it needs to be done ASAP.   Fax #: [phone].  I have pulled form that was faxed back on the 15th. I have placed it in doctors box.  Please Advise.  Thanks!

## 2022-09-17 ENCOUNTER — Encounter: Payer: Self-pay | Admitting: Orthopedic Surgery

## 2022-09-17 NOTE — Progress Notes (Signed)
Office Visit Note   Patient: Rhonda Dominguez           Date of Birth: 1961-01-25           MRN: [id number] Visit Date: 09/05/2022              Requested by: Wells Guiles, DO [place],  [place] [address] PCP: Wells Guiles, DO  Chief Complaint  Patient presents with   Right Foot - Routine Post Op    08/07/22 right GT MTP joint fusion      HPI: Patient is a 61 year old woman who is 4 weeks status post right great toe MTP fusion.  Patient states she fell in the shower last night sustaining an injury to her foot.  She is currently ambulating in bedroom slippers.  Assessment & Plan: Visit Diagnoses:  1. Pain in right foot   2. Hallux rigidus, right foot     Plan: Recommended compression with an Ace wrap and a postoperative shoe.  Three-view radiographs of the right foot at follow-up.  Follow-Up Instructions: Return in about 4 weeks (around 10/03/2022).   Ortho Exam  Patient is alert, oriented, no adenopathy, well-dressed, normal affect, normal respiratory effort. Examination patient has good pulses there is no open wounds no cellulitis there is swelling.  Imaging: No results found. No images are attached to the encounter.  Labs: Lab Results  Component Value Date   HGBA1C 5.7 06/19/2020   HGBA1C 6.0 03/14/2020   HGBA1C 5.9 12/28/2019   ESRSEDRATE 20 10/23/2017   CRP 12.8 (H) 10/23/2017     Lab Results  Component Value Date   ALBUMIN 4.6 10/23/2017   ALBUMIN 4.0 10/11/2014   ALBUMIN 4.5 03/10/2012    Lab Results  Component Value Date   MG 2.5 (H) 10/23/2017   MG 2.1 08/26/2007   No results found for: "VD25OH"  No results found for: "PREALBUMIN"    Latest Ref Rng & Units 08/07/2022    6:30 AM 12/12/2021    9:45 AM 06/19/2020    3:49 PM  CBC EXTENDED  WBC 4.0 - 10.5 K/uL 7.3  6.6  7.4   RBC 3.87 - 5.11 MIL/uL 4.59  4.73  4.90   Hemoglobin 12.0 - 15.0 g/dL 12.1  12.0  12.7   HCT 36.0 - 46.0 % 37.4  37.8  39.2   Platelets 150 - 400 K/uL  225  246  254      There is no height or weight on file to calculate BMI.  Orders:  Orders Placed This Encounter  Procedures   XR Foot Complete Right   Meds ordered this encounter  Medications   oxyCODONE-acetaminophen (PERCOCET/ROXICET) 5-325 MG tablet    Sig: Take 1 tablet by mouth every 4 (four) hours as needed.    Dispense:  30 tablet    Refill:  0     Procedures: No procedures performed  Clinical Data: No additional findings.  ROS:  All other systems negative, except as noted in the HPI. Review of Systems  Objective: Vital Signs: There were no vitals taken for this visit.  Specialty Comments:  No specialty comments available.  PMFS History: Patient Active Problem List   Diagnosis Date Noted   Hallux rigidus, right foot    Healthcare maintenance 05/28/2022   Prurigo nodularis 12/27/2021   Excessive cerumen in ear canal 08/04/2020   Nutritional counseling 07/03/2020   Hypercalcemia 07/03/2020   GERD (gastroesophageal reflux disease) 06/19/2020   Paronychia of right middle  finger 05/19/2020   COPD (chronic obstructive pulmonary disease) ([place]) 12/08/2019   Prediabetes 08/13/2019   Loud snoring 07/01/2019   Neurotic excoriations 05/20/2019   Skin lesions, generalized 10/16/2018   Complex regional pain syndrome type 1 of lower extremity (Right) 11/10/2017   Cocaine abuse ([place]) 11/10/2017   History of tobacco abuse 11/10/2017   Osteoarthritis 11/10/2017   Chronic shoulder pain (Bilateral) (L>R) 11/10/2017   Dextroscoliosis 11/10/2017   DDD (degenerative disc disease), cervical 11/10/2017   Knee pain, chronic (Secondary Area of Pain) (right) 11/09/2017   Great toe pain (Primary Area of Pain) (Right) 10/23/2017   Chronic lower extremity pain (Tertiary Area of Pain) (Right) 10/23/2017   Chronic pain syndrome 10/23/2017   Urinary incontinence 11/20/2016   Restrictive lung disease 07/23/2016   Essential hypertension 03/22/2016   High risk bisexual behavior  05/18/2015   CATARACTS, BILATERAL 06/24/2007   Bipolar affective disorder ([place]) 10/03/2006   Constipation 10/03/2006   Past Medical History:  Diagnosis Date   Bipolar disorder ([place])    Depression    Drug abuse ([place])    Hepatitis    Hypertension    Pneumothorax    Pre-diabetes     History reviewed. No pertinent family history.  Past Surgical History:  Procedure Laterality Date   ARTHRODESIS METATARSALPHALANGEAL JOINT (MTPJ) Right 08/07/2022   Procedure: FUSION RIGHT GREAT TOE METATARSOPHALANGEAL JOINT;  Surgeon: Newt Minion, MD;  Location: [facility];  Service: Orthopedics;  Laterality: Right;   CATARACT EXTRACTION Bilateral    Social History   Occupational History   Not on file  Tobacco Use   Smoking status: Former    Packs/day: 1.00    Years: 40.00    Total pack years: 40.00    Types: Cigarettes    Start date: 12/09/1978    Quit date: 03/10/2019    Years since quitting: 3.5    Passive exposure: Past   Smokeless tobacco: Never   Tobacco comments:    Smoked 1-2 ppd for 40 years  QUIT 02/2019  Substance and Sexual Activity   Alcohol use: No    Comment: none in 8 years   Drug use: No    Comment: clean 8 years   Sexual activity: Not on file

## 2022-09-20 ENCOUNTER — Inpatient Hospital Stay: Admission: RE | Admit: 2022-09-20 | Payer: Medicaid Other | Source: Ambulatory Visit

## 2022-10-03 ENCOUNTER — Ambulatory Visit (INDEPENDENT_AMBULATORY_CARE_PROVIDER_SITE_OTHER): Payer: Medicaid Other | Admitting: Orthopedic Surgery

## 2022-10-03 ENCOUNTER — Ambulatory Visit (INDEPENDENT_AMBULATORY_CARE_PROVIDER_SITE_OTHER): Payer: Medicaid Other

## 2022-10-03 DIAGNOSIS — M19071 Primary osteoarthritis, right ankle and foot: Secondary | ICD-10-CM

## 2022-10-03 DIAGNOSIS — M2021 Hallux rigidus, right foot: Secondary | ICD-10-CM

## 2022-10-03 MED ORDER — GABAPENTIN 300 MG PO CAPS: 300.0000 mg | ORAL_CAPSULE | Freq: Three times a day (TID) | ORAL | 3 refills | Status: DC

## 2022-10-03 MED ORDER — OXYCODONE-ACETAMINOPHEN 5-325 MG PO TABS: 1.0000 | ORAL_TABLET | ORAL | 0 refills | Status: DC | PRN

## 2022-10-04 ENCOUNTER — Encounter: Payer: Self-pay | Admitting: Orthopedic Surgery

## 2022-10-04 NOTE — Progress Notes (Signed)
Office Visit Note   Patient: Rhonda Dominguez           Date of Birth: 1961-07-07           MRN: [id number] Visit Date: 10/03/2022              Requested by: Wells Guiles, DO [place],  [place] [address] PCP: Wells Guiles, DO  Chief Complaint  Patient presents with   Right Foot - Routine Post Op    08/07/22 right GT MTP joint fusion      HPI: Patient is a 61 year old woman who is 2 months status post right great toe MTP fusion.  Patient complains of hypersensitivity.  Assessment & Plan: Visit Diagnoses:  1. Osteoarthritis of joint of toe of right foot   2. Hallux rigidus, right foot     Plan: We will send in a prescription for Percocet and Neurontin.  Follow-Up Instructions: Return in about 4 weeks (around 10/31/2022).   Ortho Exam  Patient is alert, oriented, no adenopathy, well-dressed, normal affect, normal respiratory effort. Examination the incision is well-healed.  Patient has hypersensitivity to light touch.  There is no redness no swelling.  Imaging: No results found. No images are attached to the encounter.  Labs: Lab Results  Component Value Date   HGBA1C 5.7 06/19/2020   HGBA1C 6.0 03/14/2020   HGBA1C 5.9 12/28/2019   ESRSEDRATE 20 10/23/2017   CRP 12.8 (H) 10/23/2017     Lab Results  Component Value Date   ALBUMIN 4.6 10/23/2017   ALBUMIN 4.0 10/11/2014   ALBUMIN 4.5 03/10/2012    Lab Results  Component Value Date   MG 2.5 (H) 10/23/2017   MG 2.1 08/26/2007   No results found for: "VD25OH"  No results found for: "PREALBUMIN"    Latest Ref Rng & Units 08/07/2022    6:30 AM 12/12/2021    9:45 AM 06/19/2020    3:49 PM  CBC EXTENDED  WBC 4.0 - 10.5 K/uL 7.3  6.6  7.4   RBC 3.87 - 5.11 MIL/uL 4.59  4.73  4.90   Hemoglobin 12.0 - 15.0 g/dL 12.1  12.0  12.7   HCT 36.0 - 46.0 % 37.4  37.8  39.2   Platelets 150 - 400 K/uL 225  246  254      There is no height or weight on file to calculate BMI.  Orders:  Orders Placed  This Encounter  Procedures   XR Foot Complete Right   Meds ordered this encounter  Medications   gabapentin (NEURONTIN) 300 MG capsule    Sig: Take 1 capsule (300 mg total) by mouth 3 (three) times daily. 3 times a day when necessary neuropathy pain    Dispense:  90 capsule    Refill:  3   oxyCODONE-acetaminophen (PERCOCET/ROXICET) 5-325 MG tablet    Sig: Take 1 tablet by mouth every 4 (four) hours as needed.    Dispense:  30 tablet    Refill:  0     Procedures: No procedures performed  Clinical Data: No additional findings.  ROS:  All other systems negative, except as noted in the HPI. Review of Systems  Objective: Vital Signs: There were no vitals taken for this visit.  Specialty Comments:  No specialty comments available.  PMFS History: Patient Active Problem List   Diagnosis Date Noted   Hallux rigidus, right foot    Healthcare maintenance 05/28/2022   Prurigo nodularis 12/27/2021   Excessive cerumen in ear  [place] 08/04/2020   Nutritional counseling 07/03/2020   Hypercalcemia 07/03/2020   GERD (gastroesophageal reflux disease) 06/19/2020   Paronychia of right middle finger 05/19/2020   COPD (chronic obstructive pulmonary disease) ([place]) 12/08/2019   Prediabetes 08/13/2019   Loud snoring 07/01/2019   Neurotic excoriations 05/20/2019   Skin lesions, generalized 10/16/2018   Complex regional pain syndrome type 1 of lower extremity (Right) 11/10/2017   Cocaine abuse ([place]) 11/10/2017   History of tobacco abuse 11/10/2017   Osteoarthritis 11/10/2017   Chronic shoulder pain (Bilateral) (L>R) 11/10/2017   Dextroscoliosis 11/10/2017   DDD (degenerative disc disease), cervical 11/10/2017   Knee pain, chronic (Secondary Area of Pain) (right) 11/09/2017   Great toe pain (Primary Area of Pain) (Right) 10/23/2017   Chronic lower extremity pain (Tertiary Area of Pain) (Right) 10/23/2017   Chronic pain syndrome 10/23/2017   Urinary incontinence 11/20/2016   Restrictive  lung disease 07/23/2016   Essential hypertension 03/22/2016   High risk bisexual behavior 05/18/2015   CATARACTS, BILATERAL 06/24/2007   Bipolar affective disorder ([place]) 10/03/2006   Constipation 10/03/2006   Past Medical History:  Diagnosis Date   Bipolar disorder ([place])    Depression    Drug abuse ([place])    Hepatitis    Hypertension    Pneumothorax    Pre-diabetes     No family history on file.  Past Surgical History:  Procedure Laterality Date   ARTHRODESIS METATARSALPHALANGEAL JOINT (MTPJ) Right 08/07/2022   Procedure: FUSION RIGHT GREAT TOE METATARSOPHALANGEAL JOINT;  Surgeon: Newt Minion, MD;  Location: [facility];  Service: Orthopedics;  Laterality: Right;   CATARACT EXTRACTION Bilateral    Social History   Occupational History   Not on file  Tobacco Use   Smoking status: Former    Packs/day: 1.00    Years: 40.00    Total pack years: 40.00    Types: Cigarettes    Start date: 12/09/1978    Quit date: 03/10/2019    Years since quitting: 3.5    Passive exposure: Past   Smokeless tobacco: Never   Tobacco comments:    Smoked 1-2 ppd for 40 years  QUIT 02/2019  Substance and Sexual Activity   Alcohol use: No    Comment: none in 8 years   Drug use: No    Comment: clean 8 years   Sexual activity: Not on file

## 2022-10-09 ENCOUNTER — Telehealth: Payer: Self-pay

## 2022-10-09 MED ORDER — PROAIR HFA 108 (90 BASE) MCG/ACT IN AERS: 2.0000 | INHALATION_SPRAY | Freq: Four times a day (QID) | RESPIRATORY_TRACT | 1 refills | Status: DC | PRN

## 2022-10-09 NOTE — Telephone Encounter (Signed)
Received fax from [facility] [address] requesting a refill on the following medicaiton:  Ventolin HFA INH W/DOS CTR 200 PUFFS  Inhale 2 puffs into the lungs every 6 hours as needed for wheezing or shortness of breath.  Please send Rx if appropriate. Ottis Stain, CMA

## 2022-10-17 ENCOUNTER — Ambulatory Visit: Payer: Medicaid Other

## 2022-10-28 ENCOUNTER — Ambulatory Visit (INDEPENDENT_AMBULATORY_CARE_PROVIDER_SITE_OTHER): Payer: Medicaid Other | Admitting: Orthopedic Surgery

## 2022-10-28 ENCOUNTER — Ambulatory Visit (INDEPENDENT_AMBULATORY_CARE_PROVIDER_SITE_OTHER): Payer: Medicaid Other

## 2022-10-28 DIAGNOSIS — M79671 Pain in right foot: Secondary | ICD-10-CM | POA: Diagnosis not present

## 2022-10-28 DIAGNOSIS — M2021 Hallux rigidus, right foot: Secondary | ICD-10-CM | POA: Diagnosis not present

## 2022-10-28 MED ORDER — OXYCODONE-ACETAMINOPHEN 5-325 MG PO TABS: 1.0000 | ORAL_TABLET | ORAL | 0 refills | Status: DC | PRN

## 2022-11-04 ENCOUNTER — Telehealth: Payer: Self-pay | Admitting: Student

## 2022-11-04 NOTE — Telephone Encounter (Signed)
Patient is calling and would like a new prescription for a walking cane sent to [facility]. The best fax number is [phone].

## 2022-11-05 ENCOUNTER — Other Ambulatory Visit: Payer: Self-pay | Admitting: Student

## 2022-11-05 DIAGNOSIS — G8929 Other chronic pain: Secondary | ICD-10-CM

## 2022-11-05 NOTE — Telephone Encounter (Signed)
Order printed. It will be in my inbox if needed.

## 2022-11-06 NOTE — Telephone Encounter (Signed)
Patient called this morning with new fax number to fax the order for the cane. I faxed over the order to the number patient gave me [phone]- fax went through. Original order placed back in doctors box.   Thanks!

## 2022-11-08 ENCOUNTER — Ambulatory Visit: Payer: Medicaid Other

## 2022-11-10 NOTE — Progress Notes (Unsigned)
  SUBJECTIVE:   CHIEF COMPLAINT / HPI:   Patient presents today for check in regarding her overall health. She provided a fax number for Estill Bamberg at [facility] [phone]).  Although, it is unclear what forms she needed filled out as she did not provide any and I asked her several times what I could do for her today and she says "I just wanted to update you and tell you how things were going".  She tells me that she has been getting her PCS hours and has been benefiting from that.  She is just ordered her new cane.  She notes the corn on her right foot is doing better and she only has 1 more visit with orthopedic surgery.  She wants to know if she should pursue getting orthotics.  PERTINENT  PMH / PSH: HTN, COPD, restrictive lung disease, chronic pain syndrome, OA  OBJECTIVE:  BP 122/84   Pulse 86   Wt 153 lb (69.4 kg)   SpO2 99%   BMI 23.96 kg/m  General: Awake, alert, NAD Psych: Elevated mood, normal affect and appropriate conversationally  ASSESSMENT/PLAN:  Hallux rigidus, right foot Assessment & Plan: I appreciate patient coming by today for general check-in.  She did not seem to have her thoughts collected regarding what form she wanted filled out.  I question whether or not she should consider physical therapy as she was walking around the room very well and may benefit from physical therapy evaluation and if she is able to walk to this extent at home, she may not be a fall risk or continue to require home medical services.  I advised her to discuss this with orthopedic surgery at her upcoming and last visit with them.   Wells Guiles, DO 11/11/2022, 5:27 PM PGY-2, [place]

## 2022-11-11 ENCOUNTER — Encounter: Payer: Self-pay | Admitting: Student

## 2022-11-11 ENCOUNTER — Ambulatory Visit (INDEPENDENT_AMBULATORY_CARE_PROVIDER_SITE_OTHER): Payer: Medicaid Other | Admitting: Student

## 2022-11-11 VITALS — BP 122/84 | HR 86 | Wt 153.0 lb

## 2022-11-11 DIAGNOSIS — M2021 Hallux rigidus, right foot: Secondary | ICD-10-CM | POA: Diagnosis not present

## 2022-11-11 NOTE — Assessment & Plan Note (Addendum)
I appreciate patient coming by today for general check-in.  She did not seem to have her thoughts collected regarding what form she wanted filled out.  I question whether or not she should consider physical therapy as she was walking around the room very well and may benefit from physical therapy evaluation and if she is able to walk to this extent at home, she may not be a fall risk or continue to require home medical services.  I advised her to discuss this with orthopedic surgery at her upcoming and last visit with them.

## 2022-11-15 LAB — HM DIABETES EYE EXAM

## 2022-11-17 ENCOUNTER — Encounter: Payer: Self-pay | Admitting: Orthopedic Surgery

## 2022-11-17 NOTE — Progress Notes (Signed)
Office Visit Note   Patient: Rhonda Dominguez           Date of Birth: 20-Jul-1961           MRN: [id number] Visit Date: 10/28/2022              Requested by: Wells Guiles, DO [place],  [place] [address] PCP: Wells Guiles, DO  Chief Complaint  Patient presents with   Right Foot - Routine Post Op      HPI: Patient is a 61 year old woman who is 4 months status post right great toe MTP fusion.  Assessment & Plan: Visit Diagnoses:  1. Pain in right foot   2. Hallux rigidus, right foot     Plan: Patient was provided 1 last prescription for Percocet.  Recommended Kerasal for her nail fungus.  The nails were trimmed without problems.  Follow-Up Instructions: No follow-ups on file.   Ortho Exam  Patient is alert, oriented, no adenopathy, well-dressed, normal affect, normal respiratory effort. Patient states that she fell again.  She was concerned for a fracture.  Radiograph shows no evidence of an acute fracture.  Imaging: No results found. No images are attached to the encounter.  Labs: Lab Results  Component Value Date   HGBA1C 5.7 06/19/2020   HGBA1C 6.0 03/14/2020   HGBA1C 5.9 12/28/2019   ESRSEDRATE 20 10/23/2017   CRP 12.8 (H) 10/23/2017     Lab Results  Component Value Date   ALBUMIN 4.6 10/23/2017   ALBUMIN 4.0 10/11/2014   ALBUMIN 4.5 03/10/2012    Lab Results  Component Value Date   MG 2.5 (H) 10/23/2017   MG 2.1 08/26/2007   No results found for: "VD25OH"  No results found for: "PREALBUMIN"    Latest Ref Rng & Units 08/07/2022    6:30 AM 12/12/2021    9:45 AM 06/19/2020    3:49 PM  CBC EXTENDED  WBC 4.0 - 10.5 K/uL 7.3  6.6  7.4   RBC 3.87 - 5.11 MIL/uL 4.59  4.73  4.90   Hemoglobin 12.0 - 15.0 g/dL 12.1  12.0  12.7   HCT 36.0 - 46.0 % 37.4  37.8  39.2   Platelets 150 - 400 K/uL 225  246  254      There is no height or weight on file to calculate BMI.  Orders:  Orders Placed This Encounter  Procedures   XR Foot  Complete Right   Meds ordered this encounter  Medications   oxyCODONE-acetaminophen (PERCOCET/ROXICET) 5-325 MG tablet    Sig: Take 1 tablet by mouth every 4 (four) hours as needed.    Dispense:  30 tablet    Refill:  0     Procedures: No procedures performed  Clinical Data: No additional findings.  ROS:  All other systems negative, except as noted in the HPI. Review of Systems  Objective: Vital Signs: There were no vitals taken for this visit.  Specialty Comments:  No specialty comments available.  PMFS History: Patient Active Problem List   Diagnosis Date Noted   Hallux rigidus, right foot    Healthcare maintenance 05/28/2022   Prurigo nodularis 12/27/2021   Excessive cerumen in ear canal 08/04/2020   Nutritional counseling 07/03/2020   Hypercalcemia 07/03/2020   GERD (gastroesophageal reflux disease) 06/19/2020   Paronychia of right middle finger 05/19/2020   COPD (chronic obstructive pulmonary disease) ([place]) 12/08/2019   Prediabetes 08/13/2019   Loud snoring 07/01/2019   Neurotic excoriations 05/20/2019  Skin lesions, generalized 10/16/2018   Complex regional pain syndrome type 1 of lower extremity (Right) 11/10/2017   Cocaine abuse ([place]) 11/10/2017   History of tobacco abuse 11/10/2017   Osteoarthritis 11/10/2017   Chronic shoulder pain (Bilateral) (L>R) 11/10/2017   Dextroscoliosis 11/10/2017   DDD (degenerative disc disease), cervical 11/10/2017   Knee pain, chronic (Secondary Area of Pain) (right) 11/09/2017   Great toe pain (Primary Area of Pain) (Right) 10/23/2017   Chronic lower extremity pain (Tertiary Area of Pain) (Right) 10/23/2017   Chronic pain syndrome 10/23/2017   Urinary incontinence 11/20/2016   Restrictive lung disease 07/23/2016   Essential hypertension 03/22/2016   High risk bisexual behavior 05/18/2015   CATARACTS, BILATERAL 06/24/2007   Bipolar affective disorder ([place]) 10/03/2006   Constipation 10/03/2006   Past Medical History:   Diagnosis Date   Bipolar disorder ([place])    Depression    Drug abuse ([place])    Hepatitis    Hypertension    Pneumothorax    Pre-diabetes     History reviewed. No pertinent family history.  Past Surgical History:  Procedure Laterality Date   ARTHRODESIS METATARSALPHALANGEAL JOINT (MTPJ) Right 08/07/2022   Procedure: FUSION RIGHT GREAT TOE METATARSOPHALANGEAL JOINT;  Surgeon: Newt Minion, MD;  Location: [employer];  Service: Orthopedics;  Laterality: Right;   CATARACT EXTRACTION Bilateral    Social History   Occupational History   Not on file  Tobacco Use   Smoking status: Former    Packs/day: 1.00    Years: 40.00    Total pack years: 40.00    Types: Cigarettes    Start date: 12/09/1978    Quit date: 03/10/2019    Years since quitting: 3.6    Passive exposure: Past   Smokeless tobacco: Never   Tobacco comments:    Smoked 1-2 ppd for 40 years  QUIT 02/2019  Substance and Sexual Activity   Alcohol use: No    Comment: none in 8 years   Drug use: No    Comment: clean 8 years   Sexual activity: Not on file

## 2022-11-20 ENCOUNTER — Ambulatory Visit: Payer: Medicaid Other

## 2022-11-25 ENCOUNTER — Ambulatory Visit: Payer: Medicaid Other | Admitting: Orthopedic Surgery

## 2022-11-28 ENCOUNTER — Ambulatory Visit (INDEPENDENT_AMBULATORY_CARE_PROVIDER_SITE_OTHER): Payer: Medicaid Other | Admitting: Orthopedic Surgery

## 2022-11-28 ENCOUNTER — Encounter: Payer: Self-pay | Admitting: Orthopedic Surgery

## 2022-11-28 DIAGNOSIS — M2021 Hallux rigidus, right foot: Secondary | ICD-10-CM

## 2022-11-28 DIAGNOSIS — M79671 Pain in right foot: Secondary | ICD-10-CM | POA: Diagnosis not present

## 2022-11-28 NOTE — Progress Notes (Signed)
Office Visit Note   Patient: Rhonda Dominguez           Date of Birth: [birth year]           MRN: [id number] Visit Date: 11/28/2022              Requested by: Wells Guiles, DO [place],  [place] [address] PCP: Wells Guiles, DO  Chief Complaint  Patient presents with   Right Foot - Follow-up      HPI: Patient is a 61 year old woman who presents complaining of forefoot pain on the right she is status post great toe MTP fusion.  Assessment & Plan: Visit Diagnoses:  1. Pain in right foot   2. Hallux rigidus, right foot     Plan: Recommended Achilles stretching this was demonstrated.  Continue with stiff soled sneakers.  Follow-Up Instructions: No follow-ups on file.   Ortho Exam  Patient is alert, oriented, no adenopathy, well-dressed, normal affect, normal respiratory effort. Examination patient has a good dorsalis pedis pulse she has dorsiflexion to neutral with her knee extended.  The fusion site is well-healed there is no swelling no cellulitis no drainage.  Imaging: No results found. No images are attached to the encounter.  Labs: Lab Results  Component Value Date   HGBA1C 5.7 06/19/2020   HGBA1C 6.0 03/14/2020   HGBA1C 5.9 12/28/2019   ESRSEDRATE 20 10/23/2017   CRP 12.8 (H) 10/23/2017     Lab Results  Component Value Date   ALBUMIN 4.6 10/23/2017   ALBUMIN 4.0 10/11/2014   ALBUMIN 4.5 03/10/2012    Lab Results  Component Value Date   MG 2.5 (H) 10/23/2017   MG 2.1 08/26/2007   No results found for: "VD25OH"  No results found for: "PREALBUMIN"    Latest Ref Rng & Units 08/07/2022    6:30 AM 12/12/2021    9:45 AM 06/19/2020    3:49 PM  CBC EXTENDED  WBC 4.0 - 10.5 K/uL 7.3  6.6  7.4   RBC 3.87 - 5.11 MIL/uL 4.59  4.73  4.90   Hemoglobin 12.0 - 15.0 g/dL 12.1  12.0  12.7   HCT 36.0 - 46.0 % 37.4  37.8  39.2   Platelets 150 - 400 K/uL 225  246  254      There is no height or weight on file to calculate BMI.  Orders:  No  orders of the defined types were placed in this encounter.  No orders of the defined types were placed in this encounter.    Procedures: No procedures performed  Clinical Data: No additional findings.  ROS:  All other systems negative, except as noted in the HPI. Review of Systems  Objective: Vital Signs: There were no vitals taken for this visit.  Specialty Comments:  No specialty comments available.  PMFS History: Patient Active Problem List   Diagnosis Date Noted   Hallux rigidus, right foot    Healthcare maintenance 05/28/2022   Prurigo nodularis 12/27/2021   Excessive cerumen in ear canal 08/04/2020   Nutritional counseling 07/03/2020   Hypercalcemia 07/03/2020   GERD (gastroesophageal reflux disease) 06/19/2020   Paronychia of right middle finger 05/19/2020   COPD (chronic obstructive pulmonary disease) ([place]) 12/08/2019   Prediabetes 08/13/2019   Loud snoring 07/01/2019   Neurotic excoriations 05/20/2019   Skin lesions, generalized 10/16/2018   Complex regional pain syndrome type 1 of lower extremity (Right) 11/10/2017   Cocaine abuse ([place]) 11/10/2017   History of tobacco abuse  11/10/2017   Osteoarthritis 11/10/2017   Chronic shoulder pain (Bilateral) (L>R) 11/10/2017   Dextroscoliosis 11/10/2017   DDD (degenerative disc disease), cervical 11/10/2017   Knee pain, chronic (Secondary Area of Pain) (right) 11/09/2017   Great toe pain (Primary Area of Pain) (Right) 10/23/2017   Chronic lower extremity pain [facility] Area of Pain) (Right) 10/23/2017   Chronic pain syndrome 10/23/2017   Urinary incontinence 11/20/2016   Restrictive lung disease 07/23/2016   Essential hypertension 03/22/2016   High risk bisexual behavior 05/18/2015   CATARACTS, BILATERAL 06/24/2007   Bipolar affective disorder ([place]) 10/03/2006   Constipation 10/03/2006   Past Medical History:  Diagnosis Date   Bipolar disorder ([place])    Depression    Drug abuse ([place])    Hepatitis     Hypertension    Pneumothorax    Pre-diabetes     No family history on file.  Past Surgical History:  Procedure Laterality Date   ARTHRODESIS METATARSALPHALANGEAL JOINT (MTPJ) Right 08/07/2022   Procedure: FUSION RIGHT GREAT TOE METATARSOPHALANGEAL JOINT;  Surgeon: Newt Minion, MD;  Location: [facility];  Service: Orthopedics;  Laterality: Right;   CATARACT EXTRACTION Bilateral    Social History   Occupational History   Not on file  Tobacco Use   Smoking status: Former    Packs/day: 1.00    Years: 40.00    Total pack years: 40.00    Types: Cigarettes    Start date: 12/09/1978    Quit date: 03/10/2019    Years since quitting: 3.7    Passive exposure: Past   Smokeless tobacco: Never   Tobacco comments:    Smoked 1-2 ppd for 40 years  QUIT 02/2019  Substance and Sexual Activity   Alcohol use: No    Comment: none in 8 years   Drug use: No    Comment: clean 8 years   Sexual activity: Not on file

## 2022-12-05 ENCOUNTER — Other Ambulatory Visit: Payer: Self-pay

## 2022-12-05 MED ORDER — TRIAMCINOLONE ACETONIDE 0.1 % EX CREA: 1.0000 | TOPICAL_CREAM | Freq: Two times a day (BID) | CUTANEOUS | 2 refills | Status: DC

## 2022-12-16 ENCOUNTER — Ambulatory Visit: Payer: Medicaid Other | Admitting: Pharmacist

## 2022-12-16 ENCOUNTER — Ambulatory Visit: Payer: Self-pay | Admitting: Student

## 2022-12-16 NOTE — Progress Notes (Deleted)
  SUBJECTIVE:   CHIEF COMPLAINT / HPI:   ***  PERTINENT  PMH / PSH: HTN, COPD, restrictive lung disease, chronic pain syndrome, OA  OBJECTIVE:  There were no vitals taken for this visit. Physical Exam   ASSESSMENT/PLAN:  There are no diagnoses linked to this encounter. No follow-ups on file. Rhonda Guiles, DO 12/16/2022, 7:46 AM PGY-***, [facility] {    This will disappear when note is signed, click to select method of visit    :1}

## 2023-01-10 ENCOUNTER — Ambulatory Visit: Payer: Medicaid Other

## 2023-01-16 ENCOUNTER — Telehealth: Payer: Self-pay | Admitting: Orthopedic Surgery

## 2023-01-16 NOTE — Telephone Encounter (Signed)
Patient called asked if she could have Peripheral Artery Disease because she has tingling in her legs. Patient said she saw this on the news and was concerned that she may have this problem with her legs.  Patient has an appointment 01/23/2023 at 3:00pm.  The number to contact patient is [phone]

## 2023-01-17 NOTE — Telephone Encounter (Signed)
I tried to call pt and mail box is full. She has an appt next Thursday and we can eval and address her concerns at that time.

## 2023-01-21 ENCOUNTER — Telehealth (HOSPITAL_COMMUNITY): Payer: Self-pay

## 2023-01-21 NOTE — Telephone Encounter (Signed)
Returned patient's call. Unable to leave voicemail due to mailbox being full.

## 2023-01-23 ENCOUNTER — Ambulatory Visit (INDEPENDENT_AMBULATORY_CARE_PROVIDER_SITE_OTHER): Payer: Medicaid Other

## 2023-01-23 ENCOUNTER — Ambulatory Visit (INDEPENDENT_AMBULATORY_CARE_PROVIDER_SITE_OTHER): Payer: Medicaid Other | Admitting: Orthopedic Surgery

## 2023-01-23 DIAGNOSIS — M79671 Pain in right foot: Secondary | ICD-10-CM | POA: Diagnosis not present

## 2023-01-23 DIAGNOSIS — M79604 Pain in right leg: Secondary | ICD-10-CM | POA: Diagnosis not present

## 2023-01-23 DIAGNOSIS — M545 Low back pain, unspecified: Secondary | ICD-10-CM | POA: Diagnosis not present

## 2023-01-23 MED ORDER — PREDNISONE 10 MG PO TABS: 10.0000 mg | ORAL_TABLET | Freq: Every day | ORAL | 0 refills | Status: DC

## 2023-01-23 MED ORDER — OXYCODONE-ACETAMINOPHEN 5-325 MG PO TABS: 1.0000 | ORAL_TABLET | ORAL | 0 refills | Status: DC | PRN

## 2023-01-24 ENCOUNTER — Encounter: Payer: Self-pay | Admitting: Orthopedic Surgery

## 2023-01-24 NOTE — Progress Notes (Signed)
Office Visit Note   Patient: Rhonda Dominguez           Date of Birth: [birth year]           MRN: [id number] Visit Date: 01/23/2023              Requested by: Wells Guiles, DO [place],  [place] [address] PCP: Wells Guiles, DO  Chief Complaint  Patient presents with   Right Foot - Follow-up      HPI: Patient is a 62 year old woman who is seen in follow-up for her right foot.  She is status post great toe MTP fusion.  Previous examination she had Achilles contracture recommended stiff sneakers and Achilles stretching.  Patient states that recently she struck her foot on garbage can and has been having pain over the IP joint.  Assessment & Plan: Visit Diagnoses:  1. Pain in right foot     Plan: With her radicular symptoms we will have her increase Neurontin to 3 times a day 300 mg and will include a prescription for prednisone and Percocet.  Follow-Up Instructions: Return in about 4 weeks (around 02/20/2023).   Ortho Exam  Patient is alert, oriented, no adenopathy, well-dressed, normal affect, normal respiratory effort. Examination patient has a positive straight leg raise on the right.  Patient has pain from the right buttocks down the right leg.  She has no focal motor weakness.  Examination of the right foot she is tender to palpation around the IP joint of the great toe.  She has a good dorsalis pedis pulse she has dorsiflexion of the ankle 20 degrees past neutral.  Her toes are straight the incision is well-healed.  Patient's radicular symptoms radiate from the right lumbar spine buttocks to the lateral aspect of the right thigh.  Imaging: XR Foot Complete Right  Result Date: 01/24/2023 Three-view radiographs of the right foot shows no evidence of fractures at the IP joint.  The hardware is stable and the MTP fusion is stable.  No images are attached to the encounter.  Labs: Lab Results  Component Value Date   HGBA1C 5.7 06/19/2020   HGBA1C 6.0  03/14/2020   HGBA1C 5.9 12/28/2019   ESRSEDRATE 20 10/23/2017   CRP 12.8 (H) 10/23/2017     Lab Results  Component Value Date   ALBUMIN 4.6 10/23/2017   ALBUMIN 4.0 10/11/2014   ALBUMIN 4.5 03/10/2012    Lab Results  Component Value Date   MG 2.5 (H) 10/23/2017   MG 2.1 08/26/2007   No results found for: "VD25OH"  No results found for: "PREALBUMIN"    Latest Ref Rng & Units 08/07/2022    6:30 AM 12/12/2021    9:45 AM 06/19/2020    3:49 PM  CBC EXTENDED  WBC 4.0 - 10.5 K/uL 7.3  6.6  7.4   RBC 3.87 - 5.11 MIL/uL 4.59  4.73  4.90   Hemoglobin 12.0 - 15.0 g/dL 12.1  12.0  12.7   HCT 36.0 - 46.0 % 37.4  37.8  39.2   Platelets 150 - 400 K/uL 225  246  254      There is no height or weight on file to calculate BMI.  Orders:  Orders Placed This Encounter  Procedures   XR Foot Complete Right   Meds ordered this encounter  Medications   oxyCODONE-acetaminophen (PERCOCET/ROXICET) 5-325 MG tablet    Sig: Take 1 tablet by mouth every 4 (four) hours as needed.    Dispense:  30 tablet    Refill:  0   predniSONE (DELTASONE) 10 MG tablet    Sig: Take 1 tablet (10 mg total) by mouth daily with breakfast.    Dispense:  30 tablet    Refill:  0     Procedures: No procedures performed  Clinical Data: No additional findings.  ROS:  All other systems negative, except as noted in the HPI. Review of Systems  Objective: Vital Signs: There were no vitals taken for this visit.  Specialty Comments:  No specialty comments available.  PMFS History: Patient Active Problem List   Diagnosis Date Noted   Hallux rigidus, right foot    Healthcare maintenance 05/28/2022   Prurigo nodularis 12/27/2021   Excessive cerumen in ear canal 08/04/2020   Nutritional counseling 07/03/2020   Hypercalcemia 07/03/2020   GERD (gastroesophageal reflux disease) 06/19/2020   Paronychia of right middle finger 05/19/2020   COPD (chronic obstructive pulmonary disease) ([place]) 12/08/2019    Prediabetes 08/13/2019   Loud snoring 07/01/2019   Neurotic excoriations 05/20/2019   Skin lesions, generalized 10/16/2018   Complex regional pain syndrome type 1 of lower extremity (Right) 11/10/2017   Cocaine abuse ([place]) 11/10/2017   History of tobacco abuse 11/10/2017   Osteoarthritis 11/10/2017   Chronic shoulder pain (Bilateral) (L>R) 11/10/2017   Dextroscoliosis 11/10/2017   DDD (degenerative disc disease), cervical 11/10/2017   Knee pain, chronic (Secondary Area of Pain) (right) 11/09/2017   Great toe pain (Primary Area of Pain) (Right) 10/23/2017   Chronic lower extremity pain (Tertiary Area of Pain) (Right) 10/23/2017   Chronic pain syndrome 10/23/2017   Urinary incontinence 11/20/2016   Restrictive lung disease 07/23/2016   Essential hypertension 03/22/2016   High risk bisexual behavior 05/18/2015   CATARACTS, BILATERAL 06/24/2007   Bipolar affective disorder ([place]) 10/03/2006   Constipation 10/03/2006   Past Medical History:  Diagnosis Date   Bipolar disorder ([place])    Depression    Drug abuse ([place])    Hepatitis    Hypertension    Pneumothorax    Pre-diabetes     History reviewed. No pertinent family history.  Past Surgical History:  Procedure Laterality Date   ARTHRODESIS METATARSALPHALANGEAL JOINT (MTPJ) Right 08/07/2022   Procedure: FUSION RIGHT GREAT TOE METATARSOPHALANGEAL JOINT;  Surgeon: Newt Minion, MD;  Location: [facility];  Service: Orthopedics;  Laterality: Right;   CATARACT EXTRACTION Bilateral    Social History   Occupational History   Not on file  Tobacco Use   Smoking status: Former    Packs/day: 1.00    Years: 40.00    Total pack years: 40.00    Types: Cigarettes    Start date: 12/09/1978    Quit date: 03/10/2019    Years since quitting: 3.8    Passive exposure: Past   Smokeless tobacco: Never   Tobacco comments:    Smoked 1-2 ppd for 40 years  QUIT 02/2019  Substance and Sexual Activity   Alcohol use: No    Comment: none in 8 years   Drug  use: No    Comment: clean 8 years   Sexual activity: Not on file

## 2023-01-28 ENCOUNTER — Ambulatory Visit (HOSPITAL_COMMUNITY)
Admission: EM | Admit: 2023-01-28 | Discharge: 2023-01-28 | Disposition: A | Discharge status: Other Acute Inpatient Hospital | Payer: Medicaid Other

## 2023-01-30 ENCOUNTER — Ambulatory Visit: Payer: Medicaid Other | Admitting: Orthopedic Surgery

## 2023-02-01 ENCOUNTER — Other Ambulatory Visit: Payer: Self-pay | Admitting: Student

## 2023-02-04 ENCOUNTER — Telehealth: Payer: Self-pay

## 2023-02-04 NOTE — Telephone Encounter (Signed)
Patient calls nurse line for (2) concerns.  Patient is requesting pads to insert into her depends. Please place DME order.  Will forward to PCP.   Patient is requesting a copy of DMV placard we have on file.   Copy given to [facility] staff to place up front.

## 2023-02-06 ENCOUNTER — Other Ambulatory Visit: Payer: Self-pay | Admitting: Student

## 2023-02-06 DIAGNOSIS — R32 Unspecified urinary incontinence: Secondary | ICD-10-CM

## 2023-02-06 NOTE — Telephone Encounter (Signed)
DME order placed for urinary incontinence pads.

## 2023-02-11 ENCOUNTER — Telehealth: Payer: Self-pay | Admitting: Orthopedic Surgery

## 2023-02-11 NOTE — Telephone Encounter (Signed)
Patient called and advised she hit her toe and it was ( she thinks broken), I offered her an appointment today with [facility] she advised she needed a taxi ride. I made an appointment for tomorrow at (9:15 to see [facility] and arranged a taxi transportation- and sent call to Triage.

## 2023-02-12 ENCOUNTER — Encounter: Payer: Self-pay | Admitting: Physician Assistant

## 2023-02-12 ENCOUNTER — Other Ambulatory Visit: Payer: Self-pay | Admitting: Orthopedic Surgery

## 2023-02-12 ENCOUNTER — Ambulatory Visit (INDEPENDENT_AMBULATORY_CARE_PROVIDER_SITE_OTHER): Payer: Medicaid Other

## 2023-02-12 ENCOUNTER — Ambulatory Visit (INDEPENDENT_AMBULATORY_CARE_PROVIDER_SITE_OTHER): Payer: Medicaid Other | Admitting: Physician Assistant

## 2023-02-12 DIAGNOSIS — M79671 Pain in right foot: Secondary | ICD-10-CM

## 2023-02-12 NOTE — Progress Notes (Signed)
Office Visit Note   Patient: Rhonda Dominguez Dominguez           Date of Birth: Feb 29, 1961           MRN: [id number] Visit Date: 02/12/2023              Requested by: Rhonda Guiles, DO [address] [place],  [place] [id number] PCP: Rhonda Guiles, DO  Chief Complaint  Patient presents with   Right Foot - Pain      HPI: Rhonda Dominguez Dominguez is a pleasant 62 year old woman who is a patient of Dr. Jess Dominguez.  She is status post first MTP fusion on her right foot last summer.  She last saw Dr. Sharol Dominguez for her right foot a couple weeks ago after hitting it on a garbage can.  She did have an Achilles contracture she was instructed in Achilles stretching.  She comes in today because again 2 days ago she hit her end of her great toe on a garbage can.  She says it was bruised.  She is wearing a postop shoe today  Assessment & Plan: Visit Diagnoses:  1. Pain in right foot     Plan: X-rays are reassuring I do not see any evidence of any fracture of the distal phalanx or any failure of her hardware of her over her MTP fusion.  Explained to her that she probably has a bony contusion.  She can follow-up with Dr. Sharol Dominguez as needed.  Reiterated the importance of wearing a supportive shoe and doing Achilles stretching to help with her gait  Follow-Up Instructions: Return if symptoms worsen or fail to improve.   Ortho Exam  Patient is alert, oriented, no adenopathy, well-dressed, normal affect, normal respiratory effort. Examination of her right foot she has well-healed surgical incision foot is warm with easily palpable pulse.  Cannot appreciate any ecchymosis may be mild soft tissue swelling she is hypersensitive even to light touch over her toe.  Brisk capillary refill  Imaging: No results found. No images are attached to the encounter.  Labs: Lab Results  Component Value Date   HGBA1C 5.7 06/19/2020   HGBA1C 6.0 03/14/2020   HGBA1C 5.9 12/28/2019   ESRSEDRATE 20 10/23/2017   CRP 12.8 (H) 10/23/2017     Lab  Results  Component Value Date   ALBUMIN 4.6 10/23/2017   ALBUMIN 4.0 10/11/2014   ALBUMIN 4.5 03/10/2012    Lab Results  Component Value Date   MG 2.5 (H) 10/23/2017   MG 2.1 08/26/2007   No results found for: "VD25OH"  No results found for: "PREALBUMIN"    Latest Ref Rng & Units 08/07/2022    6:30 AM 12/12/2021    9:45 AM 06/19/2020    3:49 PM  CBC EXTENDED  WBC 4.0 - 10.5 K/uL 7.3  6.6  7.4   RBC 3.87 - 5.11 MIL/uL 4.59  4.73  4.90   Hemoglobin 12.0 - 15.0 g/dL 12.1  12.0  12.7   HCT 36.0 - 46.0 % 37.4  37.8  39.2   Platelets 150 - 400 K/uL 225  246  254      There is no height or weight on file to calculate BMI.  Orders:  Orders Placed This Encounter  Procedures   XR Foot Complete Right   No orders of the defined types were placed in this encounter.    Procedures: No procedures performed  Clinical Data: No additional findings.  ROS:  All other systems negative, except as noted in the  HPI. Review of Systems  Objective: Vital Signs: There were no vitals taken for this visit.  Specialty Comments:  No specialty comments available.  PMFS History: Patient Active Problem List   Diagnosis Date Noted   Hallux rigidus, right foot    Healthcare maintenance 05/28/2022   Prurigo nodularis 12/27/2021   Excessive cerumen in ear canal 08/04/2020   Nutritional counseling 07/03/2020   Hypercalcemia 07/03/2020   GERD (gastroesophageal reflux disease) 06/19/2020   Paronychia of right middle finger 05/19/2020   COPD (chronic obstructive pulmonary disease) ([place]) 12/08/2019   Prediabetes 08/13/2019   Loud snoring 07/01/2019   Neurotic excoriations 05/20/2019   Skin lesions, generalized 10/16/2018   Complex regional pain syndrome type 1 of lower extremity (Right) 11/10/2017   Cocaine abuse ([place]) 11/10/2017   History of tobacco abuse 11/10/2017   Osteoarthritis 11/10/2017   Chronic shoulder pain (Bilateral) (L>R) 11/10/2017   Dextroscoliosis 11/10/2017   DDD  (degenerative disc disease), cervical 11/10/2017   Knee pain, chronic (Secondary Area of Pain) (right) 11/09/2017   Great toe pain (Primary Area of Pain) (Right) 10/23/2017   Chronic lower extremity pain (Tertiary Area of Pain) (Right) 10/23/2017   Chronic pain syndrome 10/23/2017   Urinary incontinence 11/20/2016   Restrictive lung disease 07/23/2016   Essential hypertension 03/22/2016   High risk bisexual behavior 05/18/2015   CATARACTS, BILATERAL 06/24/2007   Bipolar affective disorder ([place]) 10/03/2006   Constipation 10/03/2006   Past Medical History:  Diagnosis Date   Bipolar disorder ([place])    Depression    Drug abuse ([place])    Hepatitis    Hypertension    Pneumothorax    Pre-diabetes     No family history on file.  Past Surgical History:  Procedure Laterality Date   ARTHRODESIS METATARSALPHALANGEAL JOINT (MTPJ) Right 08/07/2022   Procedure: FUSION RIGHT GREAT TOE METATARSOPHALANGEAL JOINT;  Surgeon: Newt Minion, MD;  Location: [facility];  Service: Orthopedics;  Laterality: Right;   CATARACT EXTRACTION Bilateral    Social History   Occupational History   Not on file  Tobacco Use   Smoking status: Former    Packs/day: 1.00    Years: 40.00    Total pack years: 40.00    Types: Cigarettes    Start date: 12/09/1978    Quit date: 03/10/2019    Years since quitting: 3.9    Passive exposure: Past   Smokeless tobacco: Never   Tobacco comments:    Smoked 1-2 ppd for 40 years  QUIT 02/2019  Substance and Sexual Activity   Alcohol use: No    Comment: none in 8 years   Drug use: No    Comment: clean 8 years   Sexual activity: Not on file

## 2023-02-13 ENCOUNTER — Other Ambulatory Visit: Payer: Self-pay | Admitting: Orthopedic Surgery

## 2023-02-13 ENCOUNTER — Telehealth: Payer: Self-pay | Admitting: Orthopedic Surgery

## 2023-02-13 MED ORDER — OXYCODONE-ACETAMINOPHEN 5-325 MG PO TABS: 1.0000 | ORAL_TABLET | Freq: Three times a day (TID) | ORAL | 0 refills | Status: DC | PRN

## 2023-02-13 NOTE — Telephone Encounter (Signed)
Pt last in office 01/23/2023 for foot and LBP. She is requesting a refill on Oxycodone 5/325 last filled on that date #30 please advise.

## 2023-02-13 NOTE — Telephone Encounter (Signed)
Patient would like refill on pain medication please advise

## 2023-02-13 NOTE — Telephone Encounter (Signed)
Called pt to advise that this has been sent to [facility]

## 2023-02-20 ENCOUNTER — Ambulatory Visit: Payer: Medicaid Other | Admitting: Orthopedic Surgery

## 2023-03-01 ENCOUNTER — Other Ambulatory Visit: Payer: Self-pay | Admitting: Student

## 2023-03-01 DIAGNOSIS — K5900 Constipation, unspecified: Secondary | ICD-10-CM

## 2023-03-03 ENCOUNTER — Ambulatory Visit: Payer: Medicaid Other | Admitting: Orthopedic Surgery

## 2023-03-03 ENCOUNTER — Telehealth: Payer: Self-pay | Admitting: Orthopedic Surgery

## 2023-03-03 ENCOUNTER — Other Ambulatory Visit (INDEPENDENT_AMBULATORY_CARE_PROVIDER_SITE_OTHER): Payer: Medicaid Other

## 2023-03-03 ENCOUNTER — Encounter: Payer: Self-pay | Admitting: Orthopedic Surgery

## 2023-03-03 ENCOUNTER — Ambulatory Visit (INDEPENDENT_AMBULATORY_CARE_PROVIDER_SITE_OTHER): Payer: Medicaid Other | Admitting: Orthopedic Surgery

## 2023-03-03 DIAGNOSIS — M79671 Pain in right foot: Secondary | ICD-10-CM

## 2023-03-03 MED ORDER — OXYCODONE-ACETAMINOPHEN 5-325 MG PO TABS: 1.0000 | ORAL_TABLET | Freq: Three times a day (TID) | ORAL | 0 refills | Status: DC | PRN

## 2023-03-03 NOTE — Progress Notes (Signed)
Office Visit Note   Patient: Rhonda Dominguez           Date of Birth: 1961-06-30           MRN: [id number] Visit Date: 03/03/2023              Requested by: Wells Guiles, DO [place],  [place] [address] PCP: Wells Guiles, DO  Chief Complaint  Patient presents with   Right Foot - Follow-up      HPI: Patient is a 62 year old woman who is status post right great toe MTP fusion.  Patient states she has been having pain globally around the right foot.  Complains of swelling and pain.  She states the toes feel hard patient states she had x-rays obtained 3 weeks ago and was told there was a fracture.  Assessment & Plan: Visit Diagnoses:  1. Pain in right foot     Plan: Will provide a short fracture boot to better support her foot plus an Ace wrap to decrease swelling.  Follow-Up Instructions: No follow-ups on file.   Ortho Exam  Patient is alert, oriented, no adenopathy, well-dressed, normal affect, normal respiratory effort. Examination patient has a good dorsalis pedis pulse she does have swelling there is no redness no cellulitis no open ulcers.  She has good range of motion of the ankle.  Generalized tenderness to palpation through the midfoot and forefoot.  We reviewed her x-ray findings that shows no fracture in the foot.  No dystrophic color or skin texture or temperature.  No hypersensitivity to light touch.  Imaging: XR Foot Complete Right  Result Date: 03/03/2023 Three-view radiographs of the right foot shows stable fusion of the great toe MTP joint.  There is no fracture there is throughout the forefoot midfoot or hindfoot.  No joint subluxation or dislocation.  No images are attached to the encounter.  Labs: Lab Results  Component Value Date   HGBA1C 5.7 06/19/2020   HGBA1C 6.0 03/14/2020   HGBA1C 5.9 12/28/2019   ESRSEDRATE 20 10/23/2017   CRP 12.8 (H) 10/23/2017     Lab Results  Component Value Date   ALBUMIN 4.6 10/23/2017   ALBUMIN 4.0  10/11/2014   ALBUMIN 4.5 03/10/2012    Lab Results  Component Value Date   MG 2.5 (H) 10/23/2017   MG 2.1 08/26/2007   No results found for: "VD25OH"  No results found for: "PREALBUMIN"    Latest Ref Rng & Units 08/07/2022    6:30 AM 12/12/2021    9:45 AM 06/19/2020    3:49 PM  CBC EXTENDED  WBC 4.0 - 10.5 K/uL 7.3  6.6  7.4   RBC 3.87 - 5.11 MIL/uL 4.59  4.73  4.90   Hemoglobin 12.0 - 15.0 g/dL 12.1  12.0  12.7   HCT 36.0 - 46.0 % 37.4  37.8  39.2   Platelets 150 - 400 K/uL 225  246  254      There is no height or weight on file to calculate BMI.  Orders:  Orders Placed This Encounter  Procedures   XR Foot Complete Right   Meds ordered this encounter  Medications   oxyCODONE-acetaminophen (PERCOCET/ROXICET) 5-325 MG tablet    Sig: Take 1 tablet by mouth every 8 (eight) hours as needed.    Dispense:  20 tablet    Refill:  0     Procedures: No procedures performed  Clinical Data: No additional findings.  ROS:  All other systems negative,  except as noted in the HPI. Review of Systems  Objective: Vital Signs: There were no vitals taken for this visit.  Specialty Comments:  No specialty comments available.  PMFS History: Patient Active Problem List   Diagnosis Date Noted   Hallux rigidus, right foot    Healthcare maintenance 05/28/2022   Prurigo nodularis 12/27/2021   Excessive cerumen in ear canal 08/04/2020   Nutritional counseling 07/03/2020   Hypercalcemia 07/03/2020   GERD (gastroesophageal reflux disease) 06/19/2020   Paronychia of right middle finger 05/19/2020   COPD (chronic obstructive pulmonary disease) ([place]) 12/08/2019   Prediabetes 08/13/2019   Loud snoring 07/01/2019   Neurotic excoriations 05/20/2019   Skin lesions, generalized 10/16/2018   Complex regional pain syndrome type 1 of lower extremity (Right) 11/10/2017   Cocaine abuse ([place]) 11/10/2017   History of tobacco abuse 11/10/2017   Osteoarthritis 11/10/2017   Chronic shoulder  pain (Bilateral) (L>R) 11/10/2017   Dextroscoliosis 11/10/2017   DDD (degenerative disc disease), cervical 11/10/2017   Knee pain, chronic (Secondary Area of Pain) (right) 11/09/2017   Great toe pain (Primary Area of Pain) (Right) 10/23/2017   Chronic lower extremity pain (Tertiary Area of Pain) (Right) 10/23/2017   Chronic pain syndrome 10/23/2017   Urinary incontinence 11/20/2016   Restrictive lung disease 07/23/2016   Essential hypertension 03/22/2016   High risk bisexual behavior 05/18/2015   CATARACTS, BILATERAL 06/24/2007   Bipolar affective disorder ([place]) 10/03/2006   Constipation 10/03/2006   Past Medical History:  Diagnosis Date   Bipolar disorder ([place])    Depression    Drug abuse ([place])    Hepatitis    Hypertension    Pneumothorax    Pre-diabetes     No family history on file.  Past Surgical History:  Procedure Laterality Date   ARTHRODESIS METATARSALPHALANGEAL JOINT (MTPJ) Right 08/07/2022   Procedure: FUSION RIGHT GREAT TOE METATARSOPHALANGEAL JOINT;  Surgeon: Newt Minion, MD;  Location: [facility];  Service: Orthopedics;  Laterality: Right;   CATARACT EXTRACTION Bilateral    Social History   Occupational History   Not on file  Tobacco Use   Smoking status: Former    Packs/day: 1.00    Years: 40.00    Additional pack years: 0.00    Total pack years: 40.00    Types: Cigarettes    Start date: 12/09/1978    Quit date: 03/10/2019    Years since quitting: 3.9    Passive exposure: Past   Smokeless tobacco: Never   Tobacco comments:    Smoked 1-2 ppd for 40 years  QUIT 02/2019  Substance and Sexual Activity   Alcohol use: No    Comment: none in 8 years   Drug use: No    Comment: clean 8 years   Sexual activity: Not on file

## 2023-03-03 NOTE — Telephone Encounter (Signed)
Printed off OV notes and in the mail bucket for mail courier to pick up.

## 2023-03-03 NOTE — Telephone Encounter (Signed)
Patient would like her office notes for 03/03/23 mailed to her it was not complete when she finished her appt

## 2023-03-04 ENCOUNTER — Ambulatory Visit (INDEPENDENT_AMBULATORY_CARE_PROVIDER_SITE_OTHER): Payer: Medicaid Other | Admitting: Mental Health

## 2023-03-04 ENCOUNTER — Encounter (HOSPITAL_COMMUNITY): Payer: Self-pay

## 2023-03-04 DIAGNOSIS — F3112 Bipolar disorder, current episode manic without psychotic features, moderate: Secondary | ICD-10-CM | POA: Diagnosis not present
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
## 2023-03-07 ENCOUNTER — Telehealth: Payer: Self-pay | Admitting: Family Medicine

## 2023-03-07 MED ORDER — POLYETHYLENE GLYCOL 3350 17 GM/SCOOP PO POWD
ORAL | 11 refills | Status: DC
Start: 2023-03-07 — End: 2023-07-24

## 2023-03-07 NOTE — Telephone Encounter (Signed)
AFTER HOURS EMERGENCY LINE  Patient reports needing a refill of miralax. She says pharmacy sent in request 3 days ago but told her it was denied. Our chart has no record of that. Refill sent.   Ezequiel Essex, MD

## 2023-03-11 ENCOUNTER — Telehealth (HOSPITAL_COMMUNITY): Payer: Self-pay | Admitting: Mental Health

## 2023-03-11 NOTE — Telephone Encounter (Signed)
Therapist contacted pt to return call requested. No answer; Left HIPAA compliant message.

## 2023-03-19 ENCOUNTER — Ambulatory Visit
Admission: RE | Admit: 2023-03-19 | Discharge: 2023-03-19 | Disposition: A | Discharge status: Home / Self Care | Payer: Medicaid Other | Source: Ambulatory Visit | Attending: Family Medicine | Admitting: Family Medicine

## 2023-03-19 ENCOUNTER — Encounter: Payer: Medicaid Other | Admitting: Student

## 2023-03-19 ENCOUNTER — Ambulatory Visit (HOSPITAL_COMMUNITY): Payer: Medicaid Other | Admitting: Physician Assistant

## 2023-03-19 DIAGNOSIS — Z1231 Encounter for screening mammogram for malignant neoplasm of breast: Secondary | ICD-10-CM

## 2023-03-20 ENCOUNTER — Other Ambulatory Visit: Payer: Self-pay | Admitting: Student

## 2023-03-20 DIAGNOSIS — N6310 Unspecified lump in the right breast, unspecified quadrant: Secondary | ICD-10-CM

## 2023-03-23 NOTE — Progress Notes (Unsigned)
  SUBJECTIVE:   CHIEF COMPLAINT / HPI:   ***  PERTINENT  PMH / PSH: ***  Past Medical History:  Diagnosis Date   Bipolar disorder    Depression    Drug abuse    Hepatitis    Hypertension    Pneumothorax    Pre-diabetes     Patient Care Team: [name], DO as PCP - General (Family Medicine) OBJECTIVE:  There were no vitals taken for this visit. Physical Exam   ASSESSMENT/PLAN:  There are no diagnoses linked to this encounter. No follow-ups on file. [name], DO 03/23/2023, 9:20 PM PGY-***, [facility] {    This will disappear when note is signed, click to select method of visit    :1}

## 2023-03-24 ENCOUNTER — Encounter: Payer: Self-pay | Admitting: Student

## 2023-03-24 ENCOUNTER — Ambulatory Visit (INDEPENDENT_AMBULATORY_CARE_PROVIDER_SITE_OTHER): Payer: Medicaid Other | Admitting: Student

## 2023-03-24 ENCOUNTER — Other Ambulatory Visit: Payer: Self-pay

## 2023-03-24 VITALS — BP 128/80 | HR 78 | Ht 67.0 in | Wt 144.0 lb

## 2023-03-24 DIAGNOSIS — Z113 Encounter for screening for infections with a predominantly sexual mode of transmission: Secondary | ICD-10-CM

## 2023-03-24 DIAGNOSIS — N6310 Unspecified lump in the right breast, unspecified quadrant: Secondary | ICD-10-CM | POA: Diagnosis not present

## 2023-03-24 DIAGNOSIS — Z114 Encounter for screening for human immunodeficiency virus [HIV]: Secondary | ICD-10-CM

## 2023-03-24 DIAGNOSIS — Z13220 Encounter for screening for lipoid disorders: Secondary | ICD-10-CM | POA: Diagnosis not present

## 2023-03-24 DIAGNOSIS — N63 Unspecified lump in unspecified breast: Secondary | ICD-10-CM | POA: Insufficient documentation

## 2023-03-24 DIAGNOSIS — Z0000 Encounter for general adult medical examination without abnormal findings: Secondary | ICD-10-CM | POA: Diagnosis not present

## 2023-03-24 DIAGNOSIS — I10 Essential (primary) hypertension: Secondary | ICD-10-CM | POA: Diagnosis not present

## 2023-03-24 DIAGNOSIS — Z87891 Personal history of nicotine dependence: Secondary | ICD-10-CM

## 2023-03-24 NOTE — Patient Instructions (Addendum)
It was great to see you today! Thank you for choosing [facility] for your primary care. Rhonda Dominguez was seen for annual physical.  Today we addressed: We are to get you set up for imaging at the breast center.  I have also ordered imaging for lung cancer as you are concerned.  You are getting numerous blood tests for HIV/syphilis/hepatitis and checking your kidney and liver function.  Things to do to Keep yourself Healthy - Exercise at least 30-45 minutes a day, 3-4 days a week. >150 min of moderate intensity per week is advised. - Eat a low-fat diet with lots of fruits and vegetables, up to 7-9 servings per day. - Seatbelts can save your life. Wear them always. - Smoke detectors on every level of your home, check batteries every year. - Eye Doctor - have an eye exam every 1-2 years - Safe sex - if you may be exposed to STDs, use a condom. - Alcohol If you drink, do it moderately, less than 1 drink per day. - Health Care Power of Attorney.  Choose someone to speak for you if you are not able. - Depression is common in our stressful world.If you're feeling down or losing interest in things you normally enjoy, please come in for a visit. - Violence - If anyone is threatening or hurting you, please call immediately.   If you haven't already, sign up for My Chart to have easy access to your labs results, and communication with your primary care physician.  We are checking some labs today. If they are abnormal, I will call you. If they are normal, I will send you a MyChart message (if it is active) or a letter in the mail. If you do not hear about your labs in the next 2 weeks, please call the office. I recommend that you always bring your medications to each appointment as this makes it easy to ensure you are on the correct medications and helps [place] not miss refills when you need them.  You should return to our clinic Return if symptoms worsen or fail to improve. Please arrive 15  minutes before your appointment to ensure smooth check in process.  We appreciate your efforts in making this happen.  Thank you for allowing me to participate in your care, [name], DO 03/24/2023, 10:56 AM PGY-2, [facility]

## 2023-03-24 NOTE — Assessment & Plan Note (Signed)
Patient endorsed, deferred physician evaluation, preferred imaging strictly.  Ordered bilateral diagnostic mammo.

## 2023-03-24 NOTE — Assessment & Plan Note (Addendum)
Annual physical, unable to review medications with patient as she could not recite them or bring them with her.  PHQ score 2, reviewed and discussed.  BP reviewed and at goal.  Advance directives discussion not had.  Considered the following items based upon USPSTF recommendations: Diabetes screening: ordered Screening for elevated cholesterol: ordered HIV testing: ordered Hepatitis C: ordered Hepatitis B: ordered Syphilis if at high risk: ordered GC/CT declined Reviewed risk factors for latent tuberculosis and not indicated  Discussed family history, BRCA testing not discussed.  Cervical cancer screening: prior Pap reviewed, repeat due in 05/29/27 Breast cancer screening: discussed and ordered mammogram based upon personal history  Colorectal cancer screening: up to date on screening for CRC. Lung cancer screening: ordered. See documentation below regarding indications/risks/benefits.  Vaccinations not discussed.

## 2023-03-25 LAB — LIPID PANEL: VLDL Cholesterol Cal: 27 mg/dL (ref 5–40)

## 2023-03-26 ENCOUNTER — Other Ambulatory Visit: Payer: Self-pay | Admitting: Student

## 2023-03-26 DIAGNOSIS — R21 Rash and other nonspecific skin eruption: Secondary | ICD-10-CM

## 2023-03-26 LAB — LIPID PANEL: Chol/HDL Ratio: 2.3 ratio (ref 0.0–4.4)

## 2023-03-26 LAB — HCV INTERPRETATION

## 2023-03-27 ENCOUNTER — Ambulatory Visit: Payer: Medicaid Other | Admitting: Physician Assistant

## 2023-03-27 ENCOUNTER — Ambulatory Visit (INDEPENDENT_AMBULATORY_CARE_PROVIDER_SITE_OTHER): Payer: Medicaid Other | Admitting: Orthopedic Surgery

## 2023-03-27 ENCOUNTER — Telehealth: Payer: Self-pay | Admitting: Orthopedic Surgery

## 2023-03-27 ENCOUNTER — Other Ambulatory Visit (INDEPENDENT_AMBULATORY_CARE_PROVIDER_SITE_OTHER): Payer: Medicaid Other

## 2023-03-27 ENCOUNTER — Telehealth: Payer: Self-pay

## 2023-03-27 ENCOUNTER — Encounter: Payer: Self-pay | Admitting: Orthopedic Surgery

## 2023-03-27 DIAGNOSIS — M79672 Pain in left foot: Secondary | ICD-10-CM

## 2023-03-27 MED ORDER — OXYCODONE-ACETAMINOPHEN 5-325 MG PO TABS: 1.0000 | ORAL_TABLET | Freq: Three times a day (TID) | ORAL | 0 refills | Status: DC | PRN

## 2023-03-27 NOTE — Telephone Encounter (Signed)
Patient calls nurse line requesting lab results.   Will forward to PCP.  

## 2023-03-27 NOTE — Telephone Encounter (Signed)
Called pt to discuss lab results but she was on her way out the door and requested I call tomorrow morning.

## 2023-03-27 NOTE — Progress Notes (Signed)
Office Visit Note   Patient: Rhonda Dominguez           Date of Birth: [birth year]           MRN: [id number] Visit Date: 03/27/2023              Requested by: [name], DO [address] [place],  [place] [address] PCP: [name], DO  Chief Complaint  Patient presents with   Left Foot - Pain      HPI: Patient is a 62 year old woman who is seen for acute trauma left foot.  Patient states that a trash can rolled over her foot.  She complains of bruising and swelling.  Assessment & Plan: Visit Diagnoses:  1. Pain in left foot     Plan: Patient will use her short fracture boot to unload pressure on the great toe prescription for Percocet.  Follow-Up Instructions: Return in about 4 weeks (around 04/24/2023).   Ortho Exam  Patient is alert, oriented, no adenopathy, well-dressed, normal affect, normal respiratory effort. Examination patient has a good pulse there is bruising and ecchymosis around the great toe no angular deformity.  Radiograph shows no fractures.  Patient has decreased range of motion of the MTP joint.  Imaging: XR Foot 2 Views Left  Result Date: 03/27/2023 2 view radiographs of the left foot shows hallux rigidus with bony spurs of the left great toe MTP joint.  No fractures.  No images are attached to the encounter.  Labs: Lab Results  Component Value Date   HGBA1C 5.7 06/19/2020   HGBA1C 6.0 03/14/2020   HGBA1C 5.9 12/28/2019   ESRSEDRATE 20 10/23/2017   CRP 12.8 (H) 10/23/2017     Lab Results  Component Value Date   ALBUMIN 4.2 03/24/2023   ALBUMIN 4.6 10/23/2017   ALBUMIN 4.0 10/11/2014    Lab Results  Component Value Date   MG 2.5 (H) 10/23/2017   MG 2.1 08/26/2007   No results found for: "VD25OH"  No results found for: "PREALBUMIN"    Latest Ref Rng & Units 08/07/2022    6:30 AM 12/12/2021    9:45 AM 06/19/2020    3:49 PM  CBC EXTENDED  WBC 4.0 - 10.5 K/uL 7.3  6.6  7.4   RBC 3.87 - 5.11 MIL/uL 4.59  4.73  4.90    Hemoglobin 12.0 - 15.0 g/dL 66.0  63.0  16.0   HCT 36.0 - 46.0 % 37.4  37.8  39.2   Platelets 150 - 400 K/uL 225  246  254      There is no height or weight on file to calculate BMI.  Orders:  Orders Placed This Encounter  Procedures   XR Foot 2 Views Left   No orders of the defined types were placed in this encounter.    Procedures: No procedures performed  Clinical Data: No additional findings.  ROS:  All other systems negative, except as noted in the HPI. Review of Systems  Objective: Vital Signs: There were no vitals taken for this visit.  Specialty Comments:  No specialty comments available.  PMFS History: Patient Active Problem List   Diagnosis Date Noted   Breast lump 03/24/2023   Hallux rigidus, right foot    Encounter for annual physical exam 05/28/2022   Prurigo nodularis 12/27/2021   Excessive cerumen in ear canal 08/04/2020   Nutritional counseling 07/03/2020   Hypercalcemia 07/03/2020   GERD (gastroesophageal reflux disease) 06/19/2020   Paronychia of right middle finger 05/19/2020  COPD (chronic obstructive pulmonary disease) 12/08/2019   Prediabetes 08/13/2019   Loud snoring 07/01/2019   Neurotic excoriations 05/20/2019   Skin lesions, generalized 10/16/2018   Complex regional pain syndrome type 1 of lower extremity (Right) 11/10/2017   Cocaine abuse 11/10/2017   History of tobacco abuse 11/10/2017   Osteoarthritis 11/10/2017   Chronic shoulder pain (Bilateral) (L>R) 11/10/2017   Dextroscoliosis 11/10/2017   DDD (degenerative disc disease), cervical 11/10/2017   Knee pain, chronic (Secondary Area of Pain) (right) 11/09/2017   Great toe pain (Primary Area of Pain) (Right) 10/23/2017   Chronic lower extremity pain (Tertiary Area of Pain) (Right) 10/23/2017   Chronic pain syndrome 10/23/2017   Urinary incontinence 11/20/2016   Restrictive lung disease 07/23/2016   Essential hypertension 03/22/2016   High risk bisexual behavior 05/18/2015    CATARACTS, BILATERAL 06/24/2007   Bipolar affective disorder 10/03/2006   Constipation 10/03/2006   Past Medical History:  Diagnosis Date   Bipolar disorder    Depression    Drug abuse    Hepatitis    Hypertension    Pneumothorax    Pre-diabetes     No family history on file.  Past Surgical History:  Procedure Laterality Date   ARTHRODESIS METATARSALPHALANGEAL JOINT (MTPJ) Right 08/07/2022   Procedure: FUSION RIGHT GREAT TOE METATARSOPHALANGEAL JOINT;  Surgeon: Nadara Mustard, MD;  Location: [facility] OR;  Service: Orthopedics;  Laterality: Right;   CATARACT EXTRACTION Bilateral    Social History   Occupational History   Not on file  Tobacco Use   Smoking status: Former    Packs/day: 1.00    Years: 40.00    Additional pack years: 0.00    Total pack years: 40.00    Types: Cigarettes    Start date: 12/09/1978    Quit date: 03/10/2019    Years since quitting: 4.0    Passive exposure: Past   Smokeless tobacco: Never   Tobacco comments:    Smoked 1-2 ppd for 40 years  QUIT 02/2019  Substance and Sexual Activity   Alcohol use: No    Comment: none in 8 years   Drug use: No    Comment: clean 8 years   Sexual activity: Not on file

## 2023-03-27 NOTE — Telephone Encounter (Signed)
Patient would like Pain medication good for 4 weeks please advise

## 2023-03-28 ENCOUNTER — Telehealth: Payer: Self-pay | Admitting: Student

## 2023-03-28 NOTE — Telephone Encounter (Signed)
Patient called for results. I informed her that Dr. Yetta Barre called to give results but she did not answer.  She would like for Dr. Yetta Barre to please call her back.

## 2023-03-28 NOTE — Telephone Encounter (Signed)
Called patient back to discuss recent lab results.  We did discuss ASCVD risk of 6.1% with recommendation to start moderate intensity statin.  Patient did express concern for starting another medication as she is already on so many.  She states she just left her mental health doctor's office who wanted to put her on a medication that she does not know the name of.  She asked me if I thought she should take the medications prescribed by her mental health doctor.  She proceeded to ask several questions about her health but then would interrupt when I attempted to answer them.  Patient was speaking very fast and seemed upset about her current state of health and jumped from one topic to the next rapidly.  Advised patient I would not be able to answer all of her questions over the phone and that if she would like to discuss these with her PCP, we could schedule an appointment.  An appointment has been scheduled with Dr. Royal Piedra for 4/29.

## 2023-03-28 NOTE — Telephone Encounter (Signed)
Called pt to discuss recent lab results showing normal liver and kidney function with normal electrolytes, negative for hep C and B, negative for syphilis, and HIV test pending.   Lipid panel showing slightly elevated triglycerides and ASCVD risk of 6.1% (if she is not currently smoking) with recommendation to start moderate intensity statin.   Pt did not pick up. I did not leave results on voice mail. Advised that she call the office if she would like me to call her back vs I can send her a letter.

## 2023-03-28 NOTE — Telephone Encounter (Signed)
Dr. Lajoyce Corners does not Rx pain meds for 4 weeks at a time.

## 2023-03-28 NOTE — Telephone Encounter (Signed)
Patient calling again regarding these results. She would like doctor to call her back with these test results as well as sent her a letter with these results to address in chart. Please advise.

## 2023-03-29 LAB — COMPREHENSIVE METABOLIC PANEL
ALT: 13 IU/L (ref 0–32)
AST: 16 IU/L (ref 0–40)
Albumin/Globulin Ratio: 1.4 (ref 1.2–2.2)
Albumin: 4.2 g/dL (ref 3.9–4.9)
Alkaline Phosphatase: 116 IU/L (ref 44–121)
BUN/Creatinine Ratio: 17 (ref 12–28)
BUN: 16 mg/dL (ref 8–27)
Bilirubin Total: <0.2 mg/dL (ref 0.0–1.2)
CO2: 22 mmol/L (ref 20–29)
Calcium: 10.3 mg/dL (ref 8.7–10.3)
Chloride: 104 mmol/L (ref 96–106)
Creatinine, Ser: 0.96 mg/dL (ref 0.57–1.00)
Globulin, Total: 3.1 g/dL (ref 1.5–4.5)
Glucose: 75 mg/dL (ref 70–99)
Potassium: 3.9 mmol/L (ref 3.5–5.2)
Sodium: 143 mmol/L (ref 134–144)
Total Protein: 7.3 g/dL (ref 6.0–8.5)
eGFR: 67 mL/min/{1.73_m2} (ref >59)

## 2023-03-29 LAB — LIPID PANEL
Cholesterol, Total: 183 mg/dL (ref 100–199)
HDL: 79 mg/dL (ref >39)
LDL Chol Calc (NIH): 77 mg/dL (ref 0–99)
Triglycerides: 161 mg/dL — ABNORMAL HIGH (ref 0–149)

## 2023-03-29 LAB — HCV AB W REFLEX TO QUANT PCR: HCV Ab: NONREACTIVE

## 2023-03-29 LAB — HIV ANTIBODY (ROUTINE TESTING W REFLEX): HIV Screen 4th Generation wRfx: NONREACTIVE

## 2023-03-29 LAB — HEPATITIS B SURFACE ANTIGEN: Hepatitis B Surface Ag: NEGATIVE

## 2023-03-29 LAB — RPR: RPR Ser Ql: NONREACTIVE

## 2023-03-30 ENCOUNTER — Encounter: Payer: Self-pay | Admitting: Student

## 2023-04-02 ENCOUNTER — Ambulatory Visit
Admission: RE | Admit: 2023-04-02 | Discharge: 2023-04-02 | Disposition: A | Discharge status: Home / Self Care | Payer: Medicaid Other | Source: Ambulatory Visit | Attending: Family Medicine | Admitting: Family Medicine

## 2023-04-02 ENCOUNTER — Encounter (HOSPITAL_COMMUNITY): Payer: Self-pay | Admitting: Physician Assistant

## 2023-04-02 ENCOUNTER — Ambulatory Visit (INDEPENDENT_AMBULATORY_CARE_PROVIDER_SITE_OTHER): Payer: Medicaid Other | Admitting: Physician Assistant

## 2023-04-02 DIAGNOSIS — F3112 Bipolar disorder, current episode manic without psychotic features, moderate: Secondary | ICD-10-CM | POA: Diagnosis not present

## 2023-04-02 DIAGNOSIS — Z87891 Personal history of nicotine dependence: Secondary | ICD-10-CM

## 2023-04-02 NOTE — Progress Notes (Signed)
Psychiatric Initial Adult Assessment   Patient Identification: Rhonda Dominguez MRN:  [id number] Date of Evaluation:  04/02/2023 Referral Source: Referral Chief Complaint:   Chief Complaint  Patient presents with   Establish Care   Visit Diagnosis:    ICD-10-CM   1. Bipolar affective disorder, currently manic, moderate  F31.12       History of Present Illness:  ***  Rhonda Dominguez  Associated Signs/Symptoms: Depression Symptoms:  disturbed sleep, weight loss, crying spells,  (Hypo) Manic Symptoms:  Elevated Mood, Grandiosity, Impulsivity, Irritable Mood, Labiality of Mood, Anxiety Symptoms:  Excessive Worry, Psychotic Symptoms:   Patient denies PTSD Symptoms: Negative  Past Psychiatric History:  Patient endorses a past history of bipolar disorder  Previous Psychotropic Medications: Yes ; patient unable to recall what medication she has been on in the past  Substance Abuse History in the last 12 months:  No.  Consequences of Substance Abuse: Patient admits to using cocaine in the past  Past Medical History:  Past Medical History:  Diagnosis Date   Bipolar disorder    Depression    Drug abuse    Hepatitis    Hypertension    Pneumothorax    Pre-diabetes     Past Surgical History:  Procedure Laterality Date   ARTHRODESIS METATARSALPHALANGEAL JOINT (MTPJ) Right 08/07/2022   Procedure: FUSION RIGHT GREAT TOE METATARSOPHALANGEAL JOINT;  Surgeon: Nadara Mustard, MD;  Location: [facility] OR;  Service: Orthopedics;  Laterality: Right;   CATARACT EXTRACTION Bilateral     Family Psychiatric History:  Patient reports that most of her family members are deceased  Family history of suicide attempts: Patient denies Family history of homicide attempts: Patient denies  Family History: History reviewed. No pertinent family history.  Social History:   Social History   Socioeconomic History   Marital status: Legally Separated    Spouse name: Not on file   Number  of children: Not on file   Years of education: Not on file   Highest education level: Not on file  Occupational History   Not on file  Tobacco Use   Smoking status: Former    Packs/day: 1.00    Years: 40.00    Additional pack years: 0.00    Total pack years: 40.00    Types: Cigarettes    Start date: 12/09/1978    Quit date: 03/10/2019    Years since quitting: 4.0    Passive exposure: Past   Smokeless tobacco: Never   Tobacco comments:    Smoked 1-2 ppd for 40 years  QUIT 02/2019  Substance and Sexual Activity   Alcohol use: No    Comment: none in 8 years   Drug use: No    Comment: clean 8 years   Sexual activity: Not on file  Other Topics Concern   Not on file  Social History Narrative   Not on file   Social Determinants of Health   Financial Resource Strain: High Risk (03/04/2023)   Overall Financial Resource Strain (CARDIA)    Difficulty of Paying Living Expenses: Hard  Food Insecurity: Food Insecurity Present (03/04/2023)   Hunger Vital Sign    Worried About Running Out of Food in the Last Year: Sometimes true    Ran Out of Food in the Last Year: Sometimes true  Transportation Needs: Unmet Transportation Needs (03/04/2023)   PRAPARE - Transportation    Lack of Transportation (Medical): No    Lack of Transportation (Non-Medical): Yes  Physical Activity: Inactive (03/04/2023)   Exercise  Vital Sign    Days of Exercise per Week: 0 days    Minutes of Exercise per Session: 0 min  Stress: Stress Concern Present (03/04/2023)   [employer] of Occupational Health - Occupational Stress Questionnaire    Feeling of Stress : Very much  Social Connections: Socially Isolated (03/04/2023)   Social Connection and Isolation Panel [NHANES]    Frequency of Communication with Friends and Family: More than three times a week    Frequency of Social Gatherings with Friends and Family: Never    Attends Religious Services: Never    Database administrator or Organizations: No    Attends  Banker Meetings: Never    Marital Status: Never married    Additional Social History:  Patient reports that she has social support but states that she doesn't talk to people. Patient states that she two boys that are grown. Patient endorses housing. Patient denies being employed. Patient denies a past history of military experience.  Patient reports that she was in prison for 2 years due to embezzlement.  Highest education none by the patient is 12th grade.  Patient denies having access to weapons.  Allergies:  No Known Allergies  Metabolic Disorder Labs: Lab Results  Component Value Date   HGBA1C 5.7 06/19/2020   No results found for: "PROLACTIN" Lab Results  Component Value Date   CHOL 183 03/24/2023   TRIG 161 (H) 03/24/2023   HDL 79 03/24/2023   CHOLHDL 2.3 03/24/2023   VLDL 21 06/14/2016   LDLCALC 77 03/24/2023   LDLCALC 84 07/31/2021   Lab Results  Component Value Date   TSH 1.274 01/08/2016    Therapeutic Level Labs: No results found for: "LITHIUM" No results found for: "CBMZ" No results found for: "VALPROATE"  Current Medications: Current Outpatient Medications  Medication Sig Dispense Refill   amLODipine (NORVASC) 10 MG tablet TAKE 1 TABLET(10 MG) BY MOUTH DAILY FOR BLOOD PRESSURE 90 tablet 2   gabapentin (NEURONTIN) 300 MG capsule Take 1 capsule (300 mg total) by mouth 3 (three) times daily as needed. (Patient taking differently: Take 300 mg by mouth every morning.) 180 capsule 1   gabapentin (NEURONTIN) 300 MG capsule TAKE 1 CAPSULE(300 MG) BY MOUTH THREE TIMES DAILY AS NEEDED FOR NERVE PAIN 90 capsule 3   hydrochlorothiazide (HYDRODIURIL) 25 MG tablet TAKE 1 TABLET(25 MG) BY MOUTH DAILY FOR BLOOD PRESSURE 90 tablet 1   hydrocortisone 2.5 % cream APPLY TOPICALLY TWICE DAILY 453.6 g 1   hydrOXYzine (ATARAX) 25 MG tablet TAKE 1 TABLET(25 MG) BY MOUTH EVERY 8 HOURS AS NEEDED FOR ITCHING 90 tablet 2   oxyCODONE-acetaminophen (PERCOCET/ROXICET) 5-325 MG  tablet Take 1 tablet by mouth every 8 (eight) hours as needed. 20 tablet 0   polyethylene glycol powder (GLYCOLAX/MIRALAX) 17 GM/SCOOP powder DISSOLVE 1 CAPFUL(17 GRAMS) IN LIQUID AND DRINK ONCE DAILY 238 g 11   predniSONE (DELTASONE) 10 MG tablet Take 1 tablet (10 mg total) by mouth daily with breakfast. 30 tablet 0   triamcinolone cream (KENALOG) 0.1 % Apply 1 Application topically 2 (two) times daily. 45 g 2   VENTOLIN HFA 108 (90 Base) MCG/ACT inhaler INHALE 2 PUFFS INTO THE LUNGS EVERY 6 HOURS AS NEEDED FOR WHEEZING OR SHORTNESS OF BREATH 18 g 1   No current facility-administered medications for this visit.    Musculoskeletal: Strength & Muscle Tone: within normal limits Gait & Station: normal Patient leans: N/A  Psychiatric Specialty Exam: Review of Systems  Psychiatric/Behavioral:  Positive  for agitation. Negative for decreased concentration, dysphoric mood, hallucinations, self-injury, sleep disturbance and suicidal ideas. The patient is not nervous/anxious and is not hyperactive.     There were no vitals taken for this visit.There is no height or weight on file to calculate BMI.  General Appearance: Casual  Eye Contact:  Good  Speech:  Clear and Coherent and Normal Rate  Volume:  Normal  Mood:  Irritable  Affect:  Congruent  Thought Process:  Coherent and Descriptions of Associations: Intact  Orientation:  Full (Time, Place, and Person)  Thought Content:  WDL  Suicidal Thoughts:  No  Homicidal Thoughts:  No  Memory:  Immediate;   Good Recent;   Good Remote;   Good  Judgement:  Good  Insight:  Fair  Psychomotor Activity:  Normal  Concentration:  Concentration: Good and Attention Span: Good  Recall:  Good  Fund of Knowledge:Good  Language: Good  Akathisia:  No  Handed:  Right  AIMS (if indicated):  not done  Assets:  Communication Skills Desire for Improvement Financial Resources/Insurance Housing  ADL's:  Intact  Cognition: WNL  Sleep:  Good    Screenings: AUDIT    Flowsheet Row Office Visit from 03/04/2023 in [facility]  Alcohol Use Disorder Identification Test Final Score (AUDIT) 4      PHQ2-9    Flowsheet Row Office Visit from 04/02/2023 in [facility] Office Visit from 03/24/2023 in [facility] Office Visit from 03/04/2023 in [facility] Office Visit from 07/26/2022 in [facility] Office Visit from 05/28/2022 in [facility]  PHQ-2 Total Score 0 0 PHQ-9 Total Score -- Flowsheet Row Office Visit from 04/02/2023 in [facility] Office Visit from 03/04/2023 in [facility] Admission (Discharged) from 08/07/2022 in [facility]  C-SSRS RISK CATEGORY No Risk Low Risk No Risk       Assessment and Plan: ***    Collaboration of Care: Psychiatrist AEB patient being seen by a mental health provider at this facility, Other provider involved in patient's care AEB patient being seen by family medicine and orthopedics, and Referral or follow-up with counselor/therapist AEB patient being seen by life and the clinical social worker at this facility  Patient/Guardian was advised Release of Information must be obtained prior to any record release in order to collaborate their care with an outside provider. Patient/Guardian was advised if they have not already done so to contact the registration department to sign all necessary forms in order for [place] to release information regarding their care.   Consent: Patient/Guardian gives verbal consent for treatment and assignment of benefits for services provided during this visit. Patient/Guardian expressed understanding and agreed to proceed.   1. Bipolar affective disorder, currently manic, moderate ***  Patient to follow up in 6 weeks Provider spent a  total of 47 minutes with the patient/reviewing patient's chart  Meta Hatchet, PA 4/24/20243:50 PM

## 2023-04-03 ENCOUNTER — Ambulatory Visit
Admission: RE | Admit: 2023-04-03 | Discharge: 2023-04-03 | Disposition: A | Discharge status: Home / Self Care | Payer: Medicaid Other | Source: Ambulatory Visit | Attending: Family Medicine | Admitting: Family Medicine

## 2023-04-03 ENCOUNTER — Ambulatory Visit
Admission: RE | Admit: 2023-04-03 | Discharge: 2023-04-03 | Disposition: A | Discharge status: Home / Self Care | Payer: Medicaid Other | Source: Ambulatory Visit

## 2023-04-03 DIAGNOSIS — N6310 Unspecified lump in the right breast, unspecified quadrant: Secondary | ICD-10-CM

## 2023-04-05 ENCOUNTER — Encounter: Payer: Self-pay | Admitting: Student

## 2023-04-05 NOTE — Progress Notes (Unsigned)
  SUBJECTIVE:   CHIEF COMPLAINT / HPI:   Med rec: Patient is here to discuss her medications.  She seems adamant in not taking advice from behavioral health doctor.  She is interested in discussing cholesterol-lowering medication after recent lab work and telephone encounter with Dr. Yetta Barre.  She is happy to hear that her blood work was normal and that her scans for breast and lung cancer were nonconcerning.  Further, discussed recent Medicaid transportation department form that I received for her.  She gets Friday assistance to and from clinic partly with her ongoing foot pain.  She states she is good for another year and she is unsure why I have that form.  PERTINENT  PMH / PSH: HTN, COPD, restrictive lung disease, chronic pain syndrome, OA, bipolar disorder  Patient Care Team: [name], DO as PCP - General (Family Medicine) OBJECTIVE:  BP 128/64   Pulse 72   Ht 5\' 7"  (1.702 m)   Wt 144 lb 3.2 oz (65.4 kg)   SpO2 97%   BMI 22.58 kg/m  General: NAD, well-appearing Psych: Normal mood, pressured speech, coherent in conversation with normal thought process  ASSESSMENT/PLAN:  Hyperlipidemia, unspecified hyperlipidemia type Assessment & Plan: Cholesterol is decently well-controlled although she is interested in starting cholesterol-lowering medication.  ASCVD score of 6.1% not greatly concerning although will start atorvastatin 10 mg daily.  I have highly recommended she return for med rec in which she brings all of her medications not just a list.  Orders: -     Atorvastatin Calcium; Take 1 tablet (10 mg total) by mouth daily.  Dispense: 90 tablet; Refill: 3  Of note, does not appear she will need medication transportation form filled out.  She does not seem interested in medications from behavioral health and is not interested in having that discussion further.  As always, she is very appreciative and kind of the care that she has received.  Return in about 3 weeks (around  04/28/2023) for med rec w/ Dr. Royal Piedra. [name], DO 04/07/2023, 9:26 AM PGY-2, [facility]

## 2023-04-07 ENCOUNTER — Ambulatory Visit: Payer: Medicaid Other | Admitting: Orthopedic Surgery

## 2023-04-07 ENCOUNTER — Encounter: Payer: Self-pay | Admitting: Student

## 2023-04-07 ENCOUNTER — Ambulatory Visit (INDEPENDENT_AMBULATORY_CARE_PROVIDER_SITE_OTHER): Payer: Medicaid Other | Admitting: Student

## 2023-04-07 VITALS — BP 128/64 | HR 72 | Ht 67.0 in | Wt 144.2 lb

## 2023-04-07 DIAGNOSIS — E785 Hyperlipidemia, unspecified: Secondary | ICD-10-CM | POA: Diagnosis not present

## 2023-04-07 MED ORDER — ATORVASTATIN CALCIUM 10 MG PO TABS
10.0000 mg | ORAL_TABLET | Freq: Every day | ORAL | 3 refills | Status: DC
Start: 2023-04-07 — End: 2023-11-25

## 2023-04-07 NOTE — Assessment & Plan Note (Signed)
Cholesterol is decently well-controlled although she is interested in starting cholesterol-lowering medication.  ASCVD score of 6.1% not greatly concerning although will start atorvastatin 10 mg daily.  I have highly recommended she return for med rec in which she brings all of her medications not just a list.

## 2023-04-07 NOTE — Patient Instructions (Signed)
Great seeing you today.  I have sent in a cholesterol lowering medication prescription for you.  It is atorvastatin.  The number you would need to call for your Medicaid transportation is [phone].  I do not suspect any to fill this form out as you have continued to be able to get rides but please let me know if that changes.  I would like you to come back in sometime in the next few weeks with all of your medications so that we can go over them.

## 2023-04-08 ENCOUNTER — Other Ambulatory Visit (HOSPITAL_COMMUNITY): Payer: Self-pay | Admitting: Physician Assistant

## 2023-04-08 ENCOUNTER — Telehealth (HOSPITAL_COMMUNITY): Payer: Self-pay | Admitting: Mental Health

## 2023-04-08 ENCOUNTER — Ambulatory Visit (INDEPENDENT_AMBULATORY_CARE_PROVIDER_SITE_OTHER): Payer: Medicaid Other | Admitting: Mental Health

## 2023-04-08 DIAGNOSIS — F3112 Bipolar disorder, current episode manic without psychotic features, moderate: Secondary | ICD-10-CM

## 2023-04-08 MED ORDER — LAMOTRIGINE 25 MG PO TABS
25.0000 mg | ORAL_TABLET | Freq: Every day | ORAL | 1 refills | Status: AC
Start: 2023-04-08 — End: 2024-08-16

## 2023-04-08 NOTE — Progress Notes (Signed)
Provider was contacted by patient's licensed clinical social worker regarding her request to start on medication management.  During her last encounter, provider discussed placing patient on Lamictal 25 mg daily for mood stability.  Patient is agreeable to starting her medication.  Patient's medication to be e-prescribed to pharmacy of choice.

## 2023-04-08 NOTE — Telephone Encounter (Signed)
Contact pt to inform medication will be called into pharmacy of her choice per [place] NP. [facility] on [address]. Thankful of therapist call and stated would start to take her medication

## 2023-04-08 NOTE — Progress Notes (Signed)
   THERAPIST PROGRESS NOTE  Session Time: 8:06 am   Participation Level: Active  Behavioral Response: CasualAlertIrritable  Type of Therapy: Individual Therapy  Treatment Goals addressed: STG: [place] will increase stability in moods AEB development of x 3 emotional regulation and distress tolerance coping skills with daily medication management within the next 90 days.   ProgressTowards Goals: Initial  Interventions: Motivational Interviewing and Supportive  Summary: Rhonda Dominguez is a 62 y.o. female who presents with dx of bipolar disorder. Amarilys presents for session alert and oriented; mood and affect agitated; elevated. Speech rapid, pressured. Hyper verbal. Pacing for large duration of session. Shares difficulty getting along with front desk staff and reports to not be "crazy" Shares thoughts of not needing MH services and " I am doing just fine."  Shares thoughts of long hx of attending to mental health and has been off medications for the past x 6 years. Notes concern for the medication due to a rash being the side effect. Shares hx of being verbally aggressive with thughts she has calmed down some over the years. Reports for front desk to have been mean to her and behavior at front desk was a result of their behavior towards her. Share for on again off again boyfriend to have been glad she was attending mental health but shares for him to also be a significant stressor and trigger for her. Reports irritable agitated moods; shares depressive sxs of crying spells at times as well. Notes to take high degree of medicine currently. Puts various paperwork on clinician desk scoots chair closer and discusses medical concerns with lump in breast and surgery needed on toe Shares "I have one good titty and one sloopy titty." Unzips jacket and shows therapist chest through shirt. Able to engage with therapist and explore benefit of taking mood stabilizing medication. Shares would like to start  medication and would like for provider to call medication in for her to pick up. Denies SI/HI  Suicidal/Homicidal: Nowithout intent/plan  Therapist Response: Therapist engaged pt in therapy session. Completed check in and assessed for current level of functioning; sxs management and level of stressors. Provided safe space for pt to share thoughts on need for mental health services. Engaged in open ended questions to explore change talk and desire to engage in services. Educated on benefits of OPT and medication management to support in stability in moods and management of depressive sxs reported. Supported in navigating concerns for relationship for long time boyfriend. Guided discovery to increase and explore insight into how MH sxs have effected interactions in the community with others. Encouraged working to engage with others in calm manner and also not to exacerbate medication concerns with increase stress. Provided supportive feedback. Reviewed session and provided follow up appointment.   Plan: Return again in x 5 weeks.  Diagnosis: Bipolar affective disorder, currently manic, moderate (HCC)  Collaboration of Care: Other None  Patient/Guardian was advised Release of Information must be obtained prior to any record release in order to collaborate their care with an outside provider. Patient/Guardian was advised if they have not already done so to contact the registration department to sign all necessary forms in order for [place] to release information regarding their care.   Consent: Patient/Guardian gives verbal consent for treatment and assignment of benefits for services provided during this visit. Patient/Guardian expressed understanding and agreed to proceed.   Stephan Minister [place], [facility] 04/08/2023

## 2023-04-14 NOTE — Progress Notes (Signed)
  SUBJECTIVE:   CHIEF COMPLAINT / HPI:   Med rec: Patient presents with her medications today for med rec.  She also notes that she was started on Lamictal by her behavioral health provider and she has not yet started this medication.  She would like to know if she should start this medication.  She also presents with her paperwork for transportation which states she has been approved through 02/06/2024.  PERTINENT  PMH / PSH: HTN, COPD, restrictive lung disease, chronic pain syndrome, OA, bipolar disorder   Patient Care Team: [name], DO as PCP - General (Family Medicine) OBJECTIVE:  BP 122/68   Pulse 76   Ht 5\' 7"  (1.702 m)   Wt 138 lb 6.4 oz (62.8 kg)   SpO2 98%   BMI 21.68 kg/m  General: Well-appearing, NAD Psych: Normal mood, pressured speech, coherent conversation with normal thought process  ASSESSMENT/PLAN:  Medication management Assessment & Plan: Patient brought in all of her medications and I was fully able to review these.  She did have several bottles of the same medications (gabapentin, HCTZ, amlodipine, hydroxyzine) which I consolidated or confiscated depending on expiration date.  I am unable to tell if she is truly taking these medications as she should be.  I will consider this in the future if in the position of potentially increasing dosages of any of her medications.   Bipolar affective disorder, currently manic, moderate (HCC) Assessment & Plan: Followed by psychiatry who have recently prescribed Lamictal. She does continue to have a difficult time sitting still in exam room and is quick to leave. I have encouraged her to follow their recommendations.     Return in about 6 months (around 10/16/2023) for bloodwork. [name], DO 04/15/2023, 11:22 AM PGY-2, [facility]

## 2023-04-15 ENCOUNTER — Encounter: Payer: Self-pay | Admitting: Student

## 2023-04-15 ENCOUNTER — Ambulatory Visit: Payer: Medicaid Other | Admitting: Student

## 2023-04-15 VITALS — BP 122/68 | HR 76 | Ht 67.0 in | Wt 138.4 lb

## 2023-04-15 DIAGNOSIS — F3112 Bipolar disorder, current episode manic without psychotic features, moderate: Secondary | ICD-10-CM | POA: Diagnosis not present

## 2023-04-15 DIAGNOSIS — Z79899 Other long term (current) drug therapy: Secondary | ICD-10-CM | POA: Diagnosis present

## 2023-04-15 NOTE — Assessment & Plan Note (Signed)
Followed by psychiatry who have recently prescribed Lamictal. She does continue to have a difficult time sitting still in exam room and is quick to leave. I have encouraged her to follow their recommendations.

## 2023-04-15 NOTE — Assessment & Plan Note (Signed)
Patient brought in all of her medications and I was fully able to review these.  She did have several bottles of the same medications (gabapentin, HCTZ, amlodipine, hydroxyzine) which I consolidated or confiscated depending on expiration date.  I am unable to tell if she is truly taking these medications as she should be.  I will consider this in the future if in the position of potentially increasing dosages of any of her medications.

## 2023-04-21 ENCOUNTER — Other Ambulatory Visit: Payer: Self-pay | Admitting: Family Medicine

## 2023-04-24 ENCOUNTER — Ambulatory Visit (INDEPENDENT_AMBULATORY_CARE_PROVIDER_SITE_OTHER): Payer: Medicaid Other | Admitting: Orthopedic Surgery

## 2023-04-24 ENCOUNTER — Encounter: Payer: Self-pay | Admitting: Orthopedic Surgery

## 2023-04-24 ENCOUNTER — Other Ambulatory Visit (INDEPENDENT_AMBULATORY_CARE_PROVIDER_SITE_OTHER): Payer: Medicaid Other

## 2023-04-24 DIAGNOSIS — M205X1 Other deformities of toe(s) (acquired), right foot: Secondary | ICD-10-CM

## 2023-04-24 DIAGNOSIS — M79671 Pain in right foot: Secondary | ICD-10-CM

## 2023-04-24 MED ORDER — OXYCODONE-ACETAMINOPHEN 5-325 MG PO TABS: 1.0000 | ORAL_TABLET | Freq: Three times a day (TID) | ORAL | 0 refills | Status: DC | PRN

## 2023-04-24 NOTE — Progress Notes (Signed)
Office Visit Note   Patient: Rhonda Dominguez           Date of Birth: 12-31-1960           MRN: [id number] Visit Date: 04/24/2023              Requested by: [name], DO [address] [place],  [place] [address] PCP: [name], DO  Chief Complaint  Patient presents with   Right Foot - Pain    Hx right GT fusion 08/07/2022      HPI: Patient is a 62 year old woman who is 1 year status post right great toe MTP fusion for hallux rigidus secondary to bunion surgery.  Patient states she has had progressive clawing of her toes causing increased pain in the great toe.  Assessment & Plan: Visit Diagnoses:  1. Pain in right foot   2. Claw toe, acquired, right     Plan: Recommended a stiff soled new balance walking sneaker Voltaren gel and sole orthotics.  Patient states she would like to consider amputation of her great toe.  She is going to be in [place] for the next 2 months and I will follow-up with her when she returns to [place].  Follow-Up Instructions: No follow-ups on file.   Ortho Exam  Patient is alert, oriented, no adenopathy, well-dressed, normal affect, normal respiratory effort. Examination patient has a good dorsalis pedis pulse the great toe is straight there is no swelling no redness no ulcers there were no dystrophic changes.  She has progressive clawing of the lesser toes worse with the second toe.  Imaging: XR Foot Complete Right  Result Date: 04/24/2023 Three-view radiographs of the right foot shows a stable fusion great toe MTP joint.  No hardware complications no hardware loosening.  No images are attached to the encounter.  Labs: Lab Results  Component Value Date   HGBA1C 5.7 06/19/2020   HGBA1C 6.0 03/14/2020   HGBA1C 5.9 12/28/2019   ESRSEDRATE 20 10/23/2017   CRP 12.8 (H) 10/23/2017     Lab Results  Component Value Date   ALBUMIN 4.2 03/24/2023   ALBUMIN 4.6 10/23/2017   ALBUMIN 4.0 10/11/2014    Lab Results  Component  Value Date   MG 2.5 (H) 10/23/2017   MG 2.1 08/26/2007   No results found for: "VD25OH"  No results found for: "PREALBUMIN"    Latest Ref Rng & Units 08/07/2022    6:30 AM 12/12/2021    9:45 AM 06/19/2020    3:49 PM  CBC EXTENDED  WBC 4.0 - 10.5 K/uL 7.3  6.6  7.4   RBC 3.87 - 5.11 MIL/uL 4.59  4.73  4.90   Hemoglobin 12.0 - 15.0 g/dL 19.1  47.8  29.5   HCT 36.0 - 46.0 % 37.4  37.8  39.2   Platelets 150 - 400 K/uL 225  246  254      There is no height or weight on file to calculate BMI.  Orders:  Orders Placed This Encounter  Procedures   XR Foot Complete Right   No orders of the defined types were placed in this encounter.    Procedures: No procedures performed  Clinical Data: No additional findings.  ROS:  All other systems negative, except as noted in the HPI. Review of Systems  Objective: Vital Signs: There were no vitals taken for this visit.  Specialty Comments:  No specialty comments available.  PMFS History: Patient Active Problem List   Diagnosis Date Noted  Medication management 04/15/2023   Hyperlipidemia 04/07/2023   Breast lump 03/24/2023   Hallux rigidus, right foot    Encounter for annual physical exam 05/28/2022   Prurigo nodularis 12/27/2021   Nutritional counseling 07/03/2020   GERD (gastroesophageal reflux disease) 06/19/2020   Paronychia of right middle finger 05/19/2020   COPD (chronic obstructive pulmonary disease) (HCC) 12/08/2019   Loud snoring 07/01/2019   Neurotic excoriations 05/20/2019   Skin lesions, generalized 10/16/2018   Complex regional pain syndrome type 1 of lower extremity (Right) 11/10/2017   Cocaine abuse (HCC) 11/10/2017   History of tobacco abuse 11/10/2017   Osteoarthritis 11/10/2017   Chronic shoulder pain (Bilateral) (L>R) 11/10/2017   Dextroscoliosis 11/10/2017   DDD (degenerative disc disease), cervical 11/10/2017   Knee pain, chronic (Secondary Area of Pain) (right) 11/09/2017   Great toe pain (Primary  Area of Pain) (Right) 10/23/2017   Chronic lower extremity pain (Tertiary Area of Pain) (Right) 10/23/2017   Chronic pain syndrome 10/23/2017   Urinary incontinence 11/20/2016   Restrictive lung disease 07/23/2016   Essential hypertension 03/22/2016   High risk bisexual behavior 05/18/2015   CATARACTS, BILATERAL 06/24/2007   Bipolar affective disorder (HCC) 10/03/2006   Constipation 10/03/2006   Past Medical History:  Diagnosis Date   Bipolar disorder (HCC)    Depression    Drug abuse (HCC)    Hepatitis    Hypertension    Pneumothorax    Pre-diabetes     History reviewed. No pertinent family history.  Past Surgical History:  Procedure Laterality Date   ARTHRODESIS METATARSALPHALANGEAL JOINT (MTPJ) Right 08/07/2022   Procedure: FUSION RIGHT GREAT TOE METATARSOPHALANGEAL JOINT;  Surgeon: Nadara Mustard, MD;  Location: [facility] OR;  Service: Orthopedics;  Laterality: Right;   CATARACT EXTRACTION Bilateral    Social History   Occupational History   Not on file  Tobacco Use   Smoking status: Former    Packs/day: 1.00    Years: 40.00    Additional pack years: 0.00    Total pack years: 40.00    Types: Cigarettes    Start date: 12/09/1978    Quit date: 03/10/2019    Years since quitting: 4.1    Passive exposure: Past   Smokeless tobacco: Never   Tobacco comments:    Smoked 1-2 ppd for 40 years  QUIT 02/2019  Substance and Sexual Activity   Alcohol use: No    Comment: none in 8 years   Drug use: No    Comment: clean 8 years   Sexual activity: Not on file

## 2023-05-13 ENCOUNTER — Ambulatory Visit (INDEPENDENT_AMBULATORY_CARE_PROVIDER_SITE_OTHER): Payer: Medicaid Other | Admitting: Mental Health

## 2023-05-13 DIAGNOSIS — F3112 Bipolar disorder, current episode manic without psychotic features, moderate: Secondary | ICD-10-CM

## 2023-05-13 NOTE — Progress Notes (Unsigned)
   THERAPIST PROGRESS NOTE  Session Time: 3:00 pm ( 24 minutes)  Participation Level: Active  Behavioral Response: CasualAlertEuthymic and elevated  Type of Therapy: Individual Therapy  Treatment Goals addressed:  STG: [place] will increase stability in moods AEB development of x 3 emotional regulation and distress tolerance coping skills with daily medication management within the next 90 days.   ProgressTowards Goals: Progressing  Interventions: Supportive and MI  Summary: Rhonda Dominguez is a 62 y.o. female who presents with dx of bipolar disorder. Elianys presents for session alert and oriented; mood and affect euphoric; elevated. Speech rapid, pressured. Hyper verbal. Pacing at times during engagement; up and down. Restless. Shares to feel as if she is doing better and reports to have started to take her medications. Shares for others to have noticed improvement in her mood. Reports less agitation in moods. Notes for sleep and appetite to be good. Excited for recent passing of her birthday. Shares would like to obtain her GED And request information from clinician. Notes daily medication compliance. Signs ROI for tailored care worker. Reports desire to end session prematurely. Denies SI/HI.   Suicidal/Homicidal: Nowithout intent/plan  Therapist Response:  Therapist engaged pt in therapy session. Completed check in and assessed for current level of functioning; sxs management and level of stressors. Provided safe space for pt to share thoughts in improvement in moods. Assessed for medication compliance and provided education on importance of medication compliance and maintaining appointments. Open ended questions exploring factors that contribute to increase level of functioning. Engaged in exploring internal motivation to maintain new behavior change and pros of compliance with medications. Explored desire to present for GED courses and provided requested information. Reviewed session and  provided follow up. No safety concerns reported.   Attempted to contact Annette Stable BSW - Tailored Care- No answer LM  Plan: Return again in  x 4 weeks.  Diagnosis: Bipolar affective disorder, currently manic, moderate (HCC)  Collaboration of Care: Other None  Patient/Guardian was advised Release of Information must be obtained prior to any record release in order to collaborate their care with an outside provider. Patient/Guardian was advised if they have not already done so to contact the registration department to sign all necessary forms in order for [place] to release information regarding their care.   Consent: Patient/Guardian gives verbal consent for treatment and assignment of benefits for services provided during this visit. Patient/Guardian expressed understanding and agreed to proceed.   Stephan Minister [place], [facility] 05/13/2023

## 2023-05-16 ENCOUNTER — Telehealth (HOSPITAL_COMMUNITY): Payer: Medicaid Other | Admitting: Physician Assistant

## 2023-05-22 ENCOUNTER — Telehealth: Payer: Self-pay | Admitting: *Deleted

## 2023-05-22 NOTE — Telephone Encounter (Signed)
Received call from Amera with [facility] [phone]).  She stated that a form was faxed for patient to be completed by provider.  There is no form in box as of today, so I will send message to provider to check on this.  Please let green team know and they can have Amera send a new one if it wasn't received.     Thanks [employer]

## 2023-05-26 NOTE — Telephone Encounter (Signed)
Spoke to [employer] at [facility] and asked her to refax form.  She agreed to refax attention Shelly.  I will place in PCP's box as soon as I get it.  Glennie Hawk, CMA

## 2023-05-27 ENCOUNTER — Encounter (HOSPITAL_COMMUNITY): Payer: Self-pay | Admitting: Physician Assistant

## 2023-05-27 ENCOUNTER — Ambulatory Visit (INDEPENDENT_AMBULATORY_CARE_PROVIDER_SITE_OTHER): Payer: Medicaid Other | Admitting: Physician Assistant

## 2023-05-27 VITALS — BP 128/79 | HR 64 | Temp 97.7°F | Ht 67.0 in | Wt 143.0 lb

## 2023-05-27 DIAGNOSIS — F3112 Bipolar disorder, current episode manic without psychotic features, moderate: Secondary | ICD-10-CM | POA: Diagnosis not present

## 2023-05-27 MED ORDER — QUETIAPINE FUMARATE 50 MG PO TABS
50.0000 mg | ORAL_TABLET | Freq: Every day | ORAL | 1 refills | Status: DC
Start: 2023-05-27 — End: 2023-07-18

## 2023-05-27 NOTE — Progress Notes (Signed)
BH MD/PA/NP OP Progress Note  05/27/2023 10:21 AM Rhonda Dominguez  MRN:  [id number]  Chief Complaint:  Chief Complaint  Patient presents with   Follow-up   Medication Management   HPI:   Rhonda Dominguez is a 62 year old, African-American female with a past psychiatric history significant for bipolar affective disorder who presents to [facility] for follow-up and medication management.  Patient was previously being managed on the following psychiatric medication: Lamictal 25 mg daily.  Patient reports that although her Lamictal prescription was helpful in managing her mood, she reports that she broke out in a rash when taking the medication.  Patient would like to discontinue taking her Lamictal and be placed on another medication for the management of her mood.  Patient denies experiencing depressive symptoms at this time and further denies anxiety.  The only stressor that the patient complains of is a knot in her breast that is being assessed by her primary care provider.  Patient reports that she will be undergoing a procedure to help eliminate the knot in her breasts but states that she refuses to go under the knife to deal with the issue.  A PHQ-9 screen was performed with the patient scoring a 2.  Patient is alert and oriented x 4, pleasant, calm, cooperative, and fully engaged in conversation during the encounter.  Patient endorses very good mood.  Patient denies suicidal or homicidal ideations.  She further denies auditory or visual hallucinations and does not appear to be responding to internal/external stimuli.  Patient endorses good sleep and states that she sleeps throughout the night.  Patient endorses good appetite and eats on average 3-4 meals per day.  Patient denies alcohol consumption and illicit drug use.  Patient endorses tobacco use and smokes on average 2 cigarettes/day.  Visit Diagnosis:    ICD-10-CM   1. Bipolar affective  disorder, currently manic, moderate (HCC)  F31.12 QUEtiapine (SEROQUEL) 50 MG tablet      Past Psychiatric History:  Patient endorses a past history of bipolar disorder   Past Medical History:  Past Medical History:  Diagnosis Date   Bipolar disorder (HCC)    Depression    Drug abuse (HCC)    Hepatitis    Hypertension    Pneumothorax    Pre-diabetes     Past Surgical History:  Procedure Laterality Date   ARTHRODESIS METATARSALPHALANGEAL JOINT (MTPJ) Right 08/07/2022   Procedure: FUSION RIGHT GREAT TOE METATARSOPHALANGEAL JOINT;  Surgeon: Nadara Mustard, MD;  Location: [facility] OR;  Service: Orthopedics;  Laterality: Right;   CATARACT EXTRACTION Bilateral     Family Psychiatric History:  Patient reports that most of her family members are deceased   Family history of suicide attempts: Patient denies Family history of homicide attempts: Patient denies  Family History: History reviewed. No pertinent family history.  Social History:  Social History   Socioeconomic History   Marital status: Legally Separated    Spouse name: Not on file   Number of children: Not on file   Years of education: Not on file   Highest education level: Not on file  Occupational History   Not on file  Tobacco Use   Smoking status: Former    Packs/day: 1.00    Years: 40.00    Additional pack years: 0.00    Total pack years: 40.00    Types: Cigarettes    Start date: 12/09/1978    Quit date: 03/10/2019    Years since quitting: 4.2  Passive exposure: Past   Smokeless tobacco: Never   Tobacco comments:    Smoked 1-2 ppd for 40 years  QUIT 02/2019  Substance and Sexual Activity   Alcohol use: No    Comment: none in 8 years   Drug use: No    Comment: clean 8 years   Sexual activity: Not on file  Other Topics Concern   Not on file  Social History Narrative   Not on file   Social Determinants of Health   Financial Resource Strain: High Risk (03/04/2023)   Overall Financial Resource Strain  (CARDIA)    Difficulty of Paying Living Expenses: Hard  Food Insecurity: Food Insecurity Present (03/04/2023)   Hunger Vital Sign    Worried About Running Out of Food in the Last Year: Sometimes true    Ran Out of Food in the Last Year: Sometimes true  Transportation Needs: Unmet Transportation Needs (03/04/2023)   PRAPARE - Transportation    Lack of Transportation (Medical): No    Lack of Transportation (Non-Medical): Yes  Physical Activity: Inactive (03/04/2023)   Exercise Vital Sign    Days of Exercise per Week: 0 days    Minutes of Exercise per Session: 0 min  Stress: Stress Concern Present (03/04/2023)   [employer] of Occupational Health - Occupational Stress Questionnaire    Feeling of Stress : Very much  Social Connections: Socially Isolated (03/04/2023)   Social Connection and Isolation Panel [NHANES]    Frequency of Communication with Friends and Family: More than three times a week    Frequency of Social Gatherings with Friends and Family: Never    Attends Religious Services: Never    Database administrator or Organizations: No    Attends Engineer, structural: Never    Marital Status: Never married    Allergies: No Known Allergies  Metabolic Disorder Labs: Lab Results  Component Value Date   HGBA1C 5.7 06/19/2020   No results found for: "PROLACTIN" Lab Results  Component Value Date   CHOL 183 03/24/2023   TRIG 161 (H) 03/24/2023   HDL 79 03/24/2023   CHOLHDL 2.3 03/24/2023   VLDL 21 06/14/2016   LDLCALC 77 03/24/2023   LDLCALC 84 07/31/2021   Lab Results  Component Value Date   TSH 1.274 01/08/2016   TSH 0.727 08/26/2007    Therapeutic Level Labs: No results found for: "LITHIUM" No results found for: "VALPROATE" No results found for: "CBMZ"  Current Medications: Current Outpatient Medications  Medication Sig Dispense Refill   QUEtiapine (SEROQUEL) 50 MG tablet Take 1 tablet (50 mg total) by mouth at bedtime. 30 tablet 1   amLODipine  (NORVASC) 10 MG tablet TAKE 1 TABLET(10 MG) BY MOUTH DAILY FOR BLOOD PRESSURE 90 tablet 2   atorvastatin (LIPITOR) 10 MG tablet Take 1 tablet (10 mg total) by mouth daily. 90 tablet 3   gabapentin (NEURONTIN) 300 MG capsule TAKE 1 CAPSULE(300 MG) BY MOUTH THREE TIMES DAILY AS NEEDED FOR NERVE PAIN 90 capsule 3   hydrochlorothiazide (HYDRODIURIL) 25 MG tablet TAKE 1 TABLET(25 MG) BY MOUTH DAILY FOR BLOOD PRESSURE 90 tablet 1   hydrocortisone 2.5 % cream APPLY TOPICALLY TWICE DAILY 453.6 g 1   hydrOXYzine (ATARAX) 25 MG tablet TAKE 1 TABLET(25 MG) BY MOUTH EVERY 8 HOURS AS NEEDED FOR ITCHING 90 tablet 2   lamoTRIgine (LAMICTAL) 25 MG tablet Take 1 tablet (25 mg total) by mouth daily. 30 tablet 1   oxyCODONE-acetaminophen (PERCOCET/ROXICET) 5-325 MG tablet Take 1 tablet by  mouth every 8 (eight) hours as needed. 30 tablet 0   polyethylene glycol powder (GLYCOLAX/MIRALAX) 17 GM/SCOOP powder DISSOLVE 1 CAPFUL(17 GRAMS) IN LIQUID AND DRINK ONCE DAILY 238 g 11   triamcinolone cream (KENALOG) 0.1 % Apply 1 Application topically 2 (two) times daily. 45 g 2   VENTOLIN HFA 108 (90 Base) MCG/ACT inhaler INHALE 2 PUFFS INTO THE LUNGS EVERY 6 HOURS AS NEEDED FOR WHEEZING OR SHORTNESS OF BREATH 18 g 1   No current facility-administered medications for this visit.     Musculoskeletal: Strength & Muscle Tone: within normal limits Gait & Station: normal Patient leans: N/A  Psychiatric Specialty Exam: Review of Systems  Psychiatric/Behavioral:  Negative for decreased concentration, dysphoric mood, hallucinations, self-injury, sleep disturbance and suicidal ideas. The patient is not nervous/anxious and is not hyperactive.     There were no vitals taken for this visit.There is no height or weight on file to calculate BMI.  General Appearance: Casual  Eye Contact:  Good  Speech:  Clear and Coherent and Normal Rate  Volume:  Normal  Mood:  Euthymic  Affect:  Appropriate  Thought Process:  Coherent, Goal  Directed, and Descriptions of Associations: Intact  Orientation:  Full (Time, Place, and Person)  Thought Content: WDL   Suicidal Thoughts:  No  Homicidal Thoughts:  No  Memory:  Immediate;   Good Recent;   Good Remote;   Good  Judgement:  Good  Insight:  Good  Psychomotor Activity:  Normal  Concentration:  Concentration: Good and Attention Span: Good  Recall:  Good  Fund of Knowledge: Good  Language: Good  Akathisia:  No  Handed:  Right  AIMS (if indicated): not done  Assets:  Communication Skills Desire for Improvement Financial Resources/Insurance Housing  ADL's:  Intact  Cognition: WNL  Sleep:  Good   Screenings: AUDIT    Flowsheet Row Office Visit from 03/04/2023 in [facility]  Alcohol Use Disorder Identification Test Final Score (AUDIT) 4      GAD-7    Flowsheet Row Clinical Support from 05/27/2023 in [facility]  Total GAD-7 Score 0      PHQ2-9    Flowsheet Row Clinical Support from 05/27/2023 in [facility] Office Visit from 04/07/2023 in [facility] Office Visit from 04/02/2023 in [facility] Office Visit from 03/24/2023 in [facility] Office Visit from 03/04/2023 in [facility]  PHQ-2 Total Score 2 1 0 0 3  PHQ-9 Total Score 2 2 -- 2 12      Flowsheet Row Clinical Support from 05/27/2023 in [facility] Office Visit from 04/02/2023 in [facility] Office Visit from 03/04/2023 in [facility]  C-SSRS RISK CATEGORY No Risk No Risk Low Risk        Assessment and Plan:   Rhonda Dominguez is a 62 year old, African-American female with a past psychiatric history significant for bipolar affective disorder who presents to [facility] for follow-up and  medication management.  Patient presents to the encounter stating that she discontinued taking Lamictal due to experiencing a rash when taking the medication.  Although patient endorsed experiencing a rash when taking the medication, she reports that the medication was helpful in managing her mood.  Patient would like to be prescribed a different medication for the management of her mood.  Provider suggested  Seroquel 50 mg at bedtime for mood stability.  Patient was agreeable to recommendation.  Patient's medication to be e-prescribed to pharmacy of choice.  Provider allowed for time at the end of the encounter to discuss potential adverse side effects to patient's current medication regimen.  Patient vocalized understanding.  Collaboration of Care: Collaboration of Care: Medication Management AEB provider managing patient's psychiatric medications, Psychiatrist AEB patient being seen by a mental health provider at this facility, Other provider involved in patient's care AEB patient being seen by family medicine and orthopedics, and Referral or follow-up with counselor/therapist AEB patient being seen by a licensed clinical social worker at this facility  Patient/Guardian was advised Release of Information must be obtained prior to any record release in order to collaborate their care with an outside provider. Patient/Guardian was advised if they have not already done so to contact the registration department to sign all necessary forms in order for [place] to release information regarding their care.   Consent: Patient/Guardian gives verbal consent for treatment and assignment of benefits for services provided during this visit. Patient/Guardian expressed understanding and agreed to proceed.   1. Bipolar affective disorder, currently manic, moderate (HCC)  - QUEtiapine (SEROQUEL) 50 MG tablet; Take 1 tablet (50 mg total) by mouth at bedtime.  Dispense: 30 tablet; Refill: 1  Patient to follow-up in 6  weeks Provider spent a total of 15 minutes with the patient/reviewing patient's chart  Meta Hatchet, PA 05/27/2023, 10:21 AM

## 2023-05-27 NOTE — Telephone Encounter (Signed)
Received form. Completed and placed in to be faxed bin.

## 2023-05-27 NOTE — Telephone Encounter (Signed)
Received forms from [facility] and placed them in PCP's box for completion.  Per Rhea Pink, form is to reflect all dates that patient has had this calendar year (2024).  Glennie Hawk, CMA

## 2023-05-29 ENCOUNTER — Telehealth (HOSPITAL_COMMUNITY): Payer: Self-pay | Admitting: Mental Health

## 2023-05-30 NOTE — Telephone Encounter (Signed)
Pt returned call; no answer. Left message of return call and reminded of next appointment

## 2023-06-02 ENCOUNTER — Telehealth: Payer: Self-pay | Admitting: Student

## 2023-06-02 NOTE — Telephone Encounter (Signed)
Patient would like a referral to a throat/ear doctor because she thinks she may need her tonsils out. Can we refer her without seeing her? If so, please call patient back with the office information she is being referred to.

## 2023-06-05 ENCOUNTER — Ambulatory Visit: Payer: Self-pay | Admitting: Student

## 2023-06-10 ENCOUNTER — Ambulatory Visit: Payer: Self-pay | Admitting: Student

## 2023-06-10 ENCOUNTER — Encounter: Payer: Self-pay | Admitting: Student

## 2023-06-10 ENCOUNTER — Ambulatory Visit (INDEPENDENT_AMBULATORY_CARE_PROVIDER_SITE_OTHER): Payer: MEDICAID | Admitting: Student

## 2023-06-10 VITALS — BP 122/97 | HR 80 | Ht 67.0 in | Wt 142.8 lb

## 2023-06-10 DIAGNOSIS — L981 Factitial dermatitis: Secondary | ICD-10-CM

## 2023-06-10 MED ORDER — TRIAMCINOLONE ACETONIDE 0.1 % EX CREA: 1.0000 | TOPICAL_CREAM | Freq: Two times a day (BID) | CUTANEOUS | 2 refills | Status: DC

## 2023-06-10 NOTE — Progress Notes (Unsigned)
  SUBJECTIVE:   CHIEF COMPLAINT / HPI:   Patient endorses difficulty swallowing, she cannot further clarify.  On further conversation she says she is not worried about it but she wants to know if she needs her tonsils removed.  Endorses difficulty swallowing for the last 2 weeks.  She notes that if it gets worse, she will let me know.  Further, she endorses that she was camping with family recently and got bit by a bug and has several bite marks is also concerned of a splinter.  Mostly, her right forearm is itching her.  She has not applied any kind of ointment or cream.  PERTINENT  PMH / PSH: ***  Past Medical History:  Diagnosis Date   Bipolar disorder (HCC)    Depression    Drug abuse (HCC)    Hepatitis    Hypertension    Pneumothorax    Pre-diabetes     Patient Care Team: [name], DO as PCP - General (Family Medicine) OBJECTIVE:  BP (!) 122/97   Pulse 80   Ht 5\' 7"  (1.702 m)   Wt 142 lb 12.8 oz (64.8 kg)   SpO2 98%   BMI 22.37 kg/m  General: Well-appearing, NAD HEENT: Dentures in place, clear oropharynx, no appreciable thyromegaly, continue to be eating throughout entire encounter Skin: Several excoriations on right anterior forearm    ASSESSMENT/PLAN:  There are no diagnoses linked to this encounter. No follow-ups on file. [name], DO 06/10/2023, 3:18 PM PGY-***, [facility] {    This will disappear when note is signed, click to select method of visit    :1}

## 2023-06-10 NOTE — Patient Instructions (Signed)
It was great to see you today! Thank you for choosing [facility] for your primary care.   Today we addressed: I have prescribed some steroid cream for the itching spots on your arm.  Please let me know if the main one on your right forearm continues to grow. If your difficulty swallowing worsens, please return promptly and we can have a further discussion strictly about this matter.  If you haven't already, sign up for My Chart to have easy access to your labs results, and communication with your primary care physician.  Please arrive 15 minutes before your appointment to ensure smooth check in process.  We appreciate your efforts in making this happen.

## 2023-06-11 NOTE — Assessment & Plan Note (Signed)
Swollen portion of right arm suspect is related to bug bite given positive clinical history and significant pain although does not appear infectious.  Recommend use of triamcinolone to help curb itching sensation.  Discussed return precautions.  Her other excoriations on the anterior forearm would also benefit from use of triamcinolone.

## 2023-06-25 ENCOUNTER — Ambulatory Visit (HOSPITAL_COMMUNITY): Payer: Medicaid Other | Admitting: Mental Health

## 2023-07-02 ENCOUNTER — Encounter (HOSPITAL_COMMUNITY): Payer: MEDICAID | Admitting: Physician Assistant

## 2023-07-02 ENCOUNTER — Encounter (HOSPITAL_COMMUNITY): Payer: Self-pay

## 2023-07-03 ENCOUNTER — Encounter: Payer: Self-pay | Admitting: Orthopedic Surgery

## 2023-07-03 ENCOUNTER — Ambulatory Visit: Payer: MEDICAID | Admitting: Orthopedic Surgery

## 2023-07-03 DIAGNOSIS — M21371 Foot drop, right foot: Secondary | ICD-10-CM

## 2023-07-03 MED ORDER — OXYCODONE-ACETAMINOPHEN 5-325 MG PO TABS: 1.0000 | ORAL_TABLET | Freq: Two times a day (BID) | ORAL | 0 refills | Status: DC | PRN

## 2023-07-03 NOTE — Progress Notes (Signed)
Office Visit Note   Patient: Rhonda Dominguez           Date of Birth: 1961-07-09           MRN: [id number] Visit Date: 07/03/2023              Requested by: [name], DO [address] [place],  [place] [address] PCP: [name], DO  Chief Complaint  Patient presents with   Right Foot - Follow-up      HPI: Patient is a 62 year old woman who presents in follow-up she is status post great toe fusion of the MTP joint.  Patient states that she is a high risk of falling and feels unstable with her gait.  Assessment & Plan: Visit Diagnoses:  1. Foot drop, right     Plan: A prescription was provided for [facility] for a posterior ankle stabilizing orthosis to help stabilize the foot drop.  A prescription for Percocet for breakthrough pain.  Recommended physical therapy, patient states she does not have time for physical therapy.  Follow-Up Instructions: Return in about 3 months (around 10/03/2023).   Ortho Exam  Patient is alert, oriented, no adenopathy, well-dressed, normal affect, normal respiratory effort. Examination of the right lower extremity patient has good plantarflexion strength.  She has weakness with dorsiflexion and is developing an equinus contracture on the right.  She has some clawing of the second and third toes and there is swelling across the forefoot most likely secondary to the foot drop.  The fusion site is stable, radiograph shows a stable fusion with no complicating features no hardware failure no screws backing out.  She does have a long second and third metatarsal contributing to the clawing of the second and third toes.  Imaging: No results found. No images are attached to the encounter.  Labs: Lab Results  Component Value Date   HGBA1C 5.7 06/19/2020   HGBA1C 6.0 03/14/2020   HGBA1C 5.9 12/28/2019   ESRSEDRATE 20 10/23/2017   CRP 12.8 (H) 10/23/2017     Lab Results  Component Value Date   ALBUMIN 4.2 03/24/2023   ALBUMIN 4.6 10/23/2017    ALBUMIN 4.0 10/11/2014    Lab Results  Component Value Date   MG 2.5 (H) 10/23/2017   MG 2.1 08/26/2007   No results found for: "VD25OH"  No results found for: "PREALBUMIN"    Latest Ref Rng & Units 08/07/2022    6:30 AM 12/12/2021    9:45 AM 06/19/2020    3:49 PM  CBC EXTENDED  WBC 4.0 - 10.5 K/uL 7.3  6.6  7.4   RBC 3.87 - 5.11 MIL/uL 4.59  4.73  4.90   Hemoglobin 12.0 - 15.0 g/dL 72.5  36.6  44.0   HCT 36.0 - 46.0 % 37.4  37.8  39.2   Platelets 150 - 400 K/uL 225  246  254      There is no height or weight on file to calculate BMI.  Orders:  No orders of the defined types were placed in this encounter.  No orders of the defined types were placed in this encounter.    Procedures: No procedures performed  Clinical Data: No additional findings.  ROS:  All other systems negative, except as noted in the HPI. Review of Systems  Objective: Vital Signs: There were no vitals taken for this visit.  Specialty Comments:  No specialty comments available.  PMFS History: Patient Active Problem List   Diagnosis Date Noted   Medication  management 04/15/2023   Hyperlipidemia 04/07/2023   Breast lump 03/24/2023   Hallux rigidus, right foot    Encounter for annual physical exam 05/28/2022   Prurigo nodularis 12/27/2021   Nutritional counseling 07/03/2020   GERD (gastroesophageal reflux disease) 06/19/2020   Paronychia of right middle finger 05/19/2020   COPD (chronic obstructive pulmonary disease) (HCC) 12/08/2019   Loud snoring 07/01/2019   Neurotic excoriations 05/20/2019   Skin lesions, generalized 10/16/2018   Complex regional pain syndrome type 1 of lower extremity (Right) 11/10/2017   Cocaine abuse (HCC) 11/10/2017   History of tobacco abuse 11/10/2017   Osteoarthritis 11/10/2017   Chronic shoulder pain (Bilateral) (L>R) 11/10/2017   Dextroscoliosis 11/10/2017   DDD (degenerative disc disease), cervical 11/10/2017   Knee pain, chronic (Secondary Area of  Pain) (right) 11/09/2017   Great toe pain (Primary Area of Pain) (Right) 10/23/2017   Chronic lower extremity pain (Tertiary Area of Pain) (Right) 10/23/2017   Chronic pain syndrome 10/23/2017   Urinary incontinence 11/20/2016   Restrictive lung disease 07/23/2016   Essential hypertension 03/22/2016   High risk bisexual behavior 05/18/2015   CATARACTS, BILATERAL 06/24/2007   Bipolar affective disorder (HCC) 10/03/2006   Constipation 10/03/2006   Past Medical History:  Diagnosis Date   Bipolar disorder (HCC)    Depression    Drug abuse (HCC)    Hepatitis    Hypertension    Pneumothorax    Pre-diabetes     History reviewed. No pertinent family history.  Past Surgical History:  Procedure Laterality Date   ARTHRODESIS METATARSALPHALANGEAL JOINT (MTPJ) Right 08/07/2022   Procedure: FUSION RIGHT GREAT TOE METATARSOPHALANGEAL JOINT;  Surgeon: Nadara Mustard, MD;  Location: [facility] OR;  Service: Orthopedics;  Laterality: Right;   CATARACT EXTRACTION Bilateral    Social History   Occupational History   Not on file  Tobacco Use   Smoking status: Former    Current packs/day: 0.00    Average packs/day: 1 pack/day for 40.2 years (40.2 ttl pk-yrs)    Types: Cigarettes    Start date: 12/09/1978    Quit date: 03/10/2019    Years since quitting: 4.3    Passive exposure: Past   Smokeless tobacco: Never   Tobacco comments:    Smoked 1-2 ppd for 40 years  QUIT 02/2019  Substance and Sexual Activity   Alcohol use: No    Comment: none in 8 years   Drug use: No    Comment: clean 8 years   Sexual activity: Not on file

## 2023-07-04 ENCOUNTER — Ambulatory Visit: Payer: MEDICAID | Admitting: Student

## 2023-07-04 ENCOUNTER — Encounter: Payer: Self-pay | Admitting: Student

## 2023-07-04 ENCOUNTER — Telehealth: Payer: Self-pay | Admitting: Student

## 2023-07-04 VITALS — BP 118/74 | HR 72 | Wt 142.4 lb

## 2023-07-04 DIAGNOSIS — G894 Chronic pain syndrome: Secondary | ICD-10-CM | POA: Diagnosis not present

## 2023-07-04 NOTE — Progress Notes (Signed)
  SUBJECTIVE:   CHIEF COMPLAINT / HPI:   Patient is presenting with request to fill out paperwork regarding her personal care services.  She currently receives nursing assistance 7 days a week for bathing and household chores.  She does note that she will be moving in with her sister.  She has provided me paperwork for change of provider to [facility] health resources.  She has also notified me that she is continue to struggle with her right foot and Dr. Lajoyce Corners will be providing a brace of some sort that she will have to wear for the next year.  PERTINENT  PMH / PSH: HTN, COPD, restrictive lung disease, chronic pain syndrome, OA, bladder disorder   Patient Care Team: [name], DO as PCP - General (Family Medicine) OBJECTIVE:  BP 118/74   Pulse 72   Wt 142 lb 6.4 oz (64.6 kg)   SpO2 97%   BMI 22.30 kg/m  General: Well-appearing, NAD  ASSESSMENT/PLAN:  Chronic pain syndrome Assessment & Plan: Today's visit is related to renewal of prior services given her insurance changed she will need to go through [place].  She is provided all 3 pages however I believe page 3 is the only 1 that is necessary given it is not a change in medical status but a change in provider.  This has been notated however all 3 forms have been filled out to reflect prior documentation and change of provider.  See media tab as necessary.   [name], DO 07/04/2023, 10:13 AM PGY-3, [facility]

## 2023-07-04 NOTE — Telephone Encounter (Signed)
Patient is calling and would like for Dr. Royal Piedra to fax the paper work she left for him. I asked if she had the best fax number and she said she did not. She thinks it is listed on paper work   Please call patient when completed and faxed.

## 2023-07-04 NOTE — Assessment & Plan Note (Signed)
Today's visit is related to renewal of prior services given her insurance changed she will need to go through [facility].  She is provided all 3 pages however I believe page 3 is the only 1 that is necessary given it is not a change in medical status but a change in provider.  This has been notated however all 3 forms have been filled out to reflect prior documentation and change of provider.  See media tab as necessary.

## 2023-07-04 NOTE — Telephone Encounter (Signed)
Patient returns call to office and spoke with [facility]. She states that paperwork she gave provider today is not the right paperwork and to not fax this.   She states that she will bring in correct paperwork next week.   She is asking that provider "hold on" to paperwork that was filled out today in case he needs it to fill out new paperwork. Placed in PCP box.   Veronda Prude, RN

## 2023-07-14 ENCOUNTER — Other Ambulatory Visit: Payer: Self-pay | Admitting: Student

## 2023-07-17 NOTE — Telephone Encounter (Signed)
Patient called stating she is needing the doctor to look over the paperwork that is in his box that he was holding on to for her to get a nurse to come out to her house. Paperwork is in the providers box.  Please Advise.   Thanks!

## 2023-07-18 ENCOUNTER — Other Ambulatory Visit (HOSPITAL_COMMUNITY): Payer: Self-pay | Admitting: Psychiatry

## 2023-07-18 ENCOUNTER — Telehealth (HOSPITAL_COMMUNITY): Payer: Self-pay | Admitting: Physician Assistant

## 2023-07-18 DIAGNOSIS — F3112 Bipolar disorder, current episode manic without psychotic features, moderate: Secondary | ICD-10-CM

## 2023-07-18 MED ORDER — QUETIAPINE FUMARATE 50 MG PO TABS: 50.0000 mg | ORAL_TABLET | Freq: Every day | ORAL | 1 refills | Status: DC

## 2023-07-18 NOTE — Telephone Encounter (Signed)
Patient returns call to nurse line regarding paperwork for home health nurse. She states that agency is now requesting the previously completed paperwork from visit on 7/26.  Patient states that this is the paperwork they are needing and is adamant that we re-fax this paperwork today.   Faxed to number provided on form at [phone].  Success of fax confirmed.   Copy of paperwork made and placed in batch scanning.   Veronda Prude, RN

## 2023-07-18 NOTE — Telephone Encounter (Signed)
Seroquel sent to preferred pharmacy.

## 2023-07-22 ENCOUNTER — Ambulatory Visit: Payer: MEDICAID | Admitting: Student

## 2023-07-24 ENCOUNTER — Ambulatory Visit (INDEPENDENT_AMBULATORY_CARE_PROVIDER_SITE_OTHER): Payer: MEDICAID | Admitting: Pharmacist

## 2023-07-24 ENCOUNTER — Encounter: Payer: Self-pay | Admitting: Pharmacist

## 2023-07-24 VITALS — BP 138/89 | HR 108 | Ht 65.0 in | Wt 143.0 lb

## 2023-07-24 DIAGNOSIS — J449 Chronic obstructive pulmonary disease, unspecified: Secondary | ICD-10-CM | POA: Diagnosis not present

## 2023-07-24 MED ORDER — ANORO ELLIPTA 62.5-25 MCG/ACT IN AEPB
INHALATION_SPRAY | RESPIRATORY_TRACT | 11 refills | Status: DC
Start: 2023-07-24 — End: 2024-02-25

## 2023-07-24 MED ORDER — GABAPENTIN 300 MG PO CAPS: 300.0000 mg | ORAL_CAPSULE | Freq: Three times a day (TID) | ORAL | 3 refills | Status: DC

## 2023-07-24 MED ORDER — POLYETHYLENE GLYCOL 3350 17 GM/SCOOP PO POWD: ORAL | 11 refills | Status: DC

## 2023-07-24 NOTE — Patient Instructions (Signed)
It was nice to see you today!  Medication Changes: START Anoro Ellipta 1 inhalation daily.  Continue all other medication the same.

## 2023-07-24 NOTE — Progress Notes (Signed)
   S:  Chief Complaint  Patient presents with   Medication Management   62 y.o. female who presents for diabetes evaluation, education, and management. Patient arrives with high energy with rapid thoughts/speech and presents without any assistance. She states having pain walking around as she forgot to bring her cane.   Patient was referred and last seen by Primary Care Provider, Dr. Royal Piedra, on 07/04/23. Patient was last seen in pharmacy clinic 07/01/2019.  PMH is significant for bipolar disorder, COPD, former cocaine abuse, chronic pain, former tobacco use. At last telephone call with pharmacy clinic on 05/31/21, patient reported having quit smoking for >27 months.   Patient reports breathing has been worse recently. Patient believes this is due to a switch in the type of albuterol rescue inhaler the pharmacy gives her, which does not relieve her symptoms for as long as she wants.   Patient reports she takes all medications as prescribed. She has been out of Miralax (polyethylene glycol) and gabapentin, and requests refills (which we will send in today).  Patient had previously been on Trelegy Ellipta (fluticasone/umeclidin/vilanterol - ICS/LAMA/LABA) which she stopped taking in 2021. Patient requests to restart a daily inhaler again.  Empty box of cigarettes was seen in the patient's purse, and when asked about it, patient endorsed smoking only one cigarette recently/ since quitting years ago. Patient has had high level of stress recently due to her son's "baby's mother" passing away recently.  O: Review of Systems  Musculoskeletal:  Positive for joint pain (foot pain - states that she will receive a brace to wear for a year from her othopedic doctor).  All other systems reviewed and are negative.   Physical Exam Constitutional:      Appearance: Normal appearance.  Neurological:     Mental Status: She is alert.    Vitals:   07/24/23 0922  BP: 138/89  Pulse: (!) 108  SpO2: 100%     Patient is participating in a Managed Medicaid Plan:  Yes   A/P: History of COPD and tobacco use disorder.  Patient has been experiencing increased shortness of breath and increased need for utilizing her albuterol inhaler. She states using albuterol every 6 hours around the clock. Denies use of any other maintenance inhaler.  Previously treated with Trelegy ellipta.  Unsure of benefit of steroid at this time.  - Start Anoro Ellipta (umeclidin/vilanterol - LAMA/LABA) 1 inhalation daily. If patient's shortness of breath is not controlled with the addition of this inhaler, consider escalating therapy to Trelegy Ellipta (fluticasone/umeclidin/vilanterol - ICS/LAMA/LABA). Patient with good inhaler technique with demo device.  - Continue albuterol rescue inhaler as needed.  Sent in refills for Miralax (polyethylene glycol) and gabapentin, per patient request.  Written patient instructions provided.   Total time in face to face counseling 28 minutes.    Follow-up:  Pharmacist PRN PCP clinic visit PRN Patient seen with Rickey Primus, PharmD Candidate and Andee Poles, PharmD Candidate.

## 2023-07-24 NOTE — Assessment & Plan Note (Signed)
History of COPD and tobacco use disorder.  Patient has been experiencing increased shortness of breath and increased need for utilizing her albuterol inhaler. She states using albuterol every 6 hours around the clock. Denies use of any other maintenance inhaler.  Previously treated with Trelegy ellipta.  Unsure of benefit of steroid at this time.  - Start Anoro Ellipta (umeclidin/vilanterol - LAMA/LABA) 1 inhalation daily. If patient's shortness of breath is not controlled with the addition of this inhaler, consider escalating therapy to Trelegy Ellipta (fluticasone/umeclidin/vilanterol - ICS/LAMA/LABA). Patient with good inhaler technique with demo device.  - Continue albuterol rescue inhaler as needed.

## 2023-07-24 NOTE — Progress Notes (Signed)
Reviewed and agree with Dr Koval's plan.   

## 2023-07-25 NOTE — Telephone Encounter (Signed)
Thank you :)

## 2023-07-29 ENCOUNTER — Telehealth (HOSPITAL_COMMUNITY): Payer: Self-pay | Admitting: Physician Assistant

## 2023-07-29 NOTE — Telephone Encounter (Signed)
@  11:49am :Therapist contacted client to discuss requested CCA from SW. Pt stated SW [facility] with Care management requested CCA as well as she would like to have copy as well. Therapist informed there is an ROI on file for Annette Stable SW with Care Management and she has emailed information to SW about retrieving medical records. Educated Pt to contact medical records as well for request. Therapist offered contact information to Medical records to client however pt denied to take information and said would obtain from SW. Therapist reminded pt of upcoming medication management and therapy appointments. Pt thanked clinician for call.

## 2023-08-07 ENCOUNTER — Telehealth: Payer: Self-pay | Admitting: Family

## 2023-08-07 ENCOUNTER — Telehealth: Payer: Self-pay | Admitting: Orthopedic Surgery

## 2023-08-07 NOTE — Telephone Encounter (Signed)
I called pt and advised that I did send message to provider and that we do have 24 hours to respond. As soon as I hear back I will call and let her know.  Pt voiced understanding

## 2023-08-07 NOTE — Telephone Encounter (Signed)
Pt called requesting an update for refill. Please call pt at [phone].

## 2023-08-07 NOTE — Telephone Encounter (Signed)
Patient called and needs something for pain for her foot. ZO#[phone]

## 2023-08-07 NOTE — Telephone Encounter (Signed)
Pt has a hx for MTP fusion and foot drop. Last in office 07/03/2023 pt requesting something for pain. Last rx was for Oxycodone for break through pain given at that time of that appt. Please advise.

## 2023-08-08 MED ORDER — OXYCODONE-ACETAMINOPHEN 5-325 MG PO TABS: 1.0000 | ORAL_TABLET | Freq: Two times a day (BID) | ORAL | 0 refills | Status: DC | PRN

## 2023-08-08 NOTE — Addendum Note (Signed)
Addended by: Adonis Huguenin on: 08/08/2023 08:29 AM   Modules accepted: Orders

## 2023-08-12 ENCOUNTER — Encounter (HOSPITAL_COMMUNITY): Payer: Medicaid Other | Admitting: Physician Assistant

## 2023-08-13 ENCOUNTER — Other Ambulatory Visit: Payer: Self-pay | Admitting: Student

## 2023-08-13 DIAGNOSIS — R21 Rash and other nonspecific skin eruption: Secondary | ICD-10-CM

## 2023-08-21 ENCOUNTER — Telehealth (HOSPITAL_COMMUNITY): Payer: Self-pay | Admitting: Mental Health

## 2023-08-21 NOTE — Telephone Encounter (Signed)
PT cancelled her 9/18th appt with you because she's moving. She would like for you to reach out to her if you can.

## 2023-08-25 ENCOUNTER — Telehealth: Payer: Self-pay | Admitting: Orthopedic Surgery

## 2023-08-25 NOTE — Telephone Encounter (Signed)
I called pt and she said that she has contacted [facility] and they will address her brace that is to help with foot drop. Pt states that what she has not does not fit well and that they will address.

## 2023-08-25 NOTE — Telephone Encounter (Signed)
Pt called requesting a call back from Autumn F. Pt states she need a new show from [facility]. She states her shoe is broken . Please call pt about this matter she is asking ASAP. It looks from her chart note [facility] but not sure of [facility] location and also looks like Percocet. Pt phone number is [phone].

## 2023-08-27 ENCOUNTER — Ambulatory Visit (HOSPITAL_COMMUNITY): Payer: MEDICAID | Admitting: Mental Health

## 2023-09-02 ENCOUNTER — Other Ambulatory Visit: Payer: Self-pay | Admitting: Student

## 2023-09-10 ENCOUNTER — Telehealth: Payer: Self-pay

## 2023-09-10 NOTE — Telephone Encounter (Signed)
Amira with [employer], [employer] line in regards to transportation forms.   She reports they have not received Medicaid Transportation Exception Verification form. She reports this has been faxed to our office several times since April.   Advised we have attempted to fax this multiple times.  Will print from media and resend to [phone].

## 2023-09-15 ENCOUNTER — Ambulatory Visit (INDEPENDENT_AMBULATORY_CARE_PROVIDER_SITE_OTHER): Payer: MEDICAID | Admitting: Orthopedic Surgery

## 2023-09-15 ENCOUNTER — Encounter: Payer: Self-pay | Admitting: Orthopedic Surgery

## 2023-09-15 DIAGNOSIS — M21371 Foot drop, right foot: Secondary | ICD-10-CM

## 2023-09-15 DIAGNOSIS — M205X1 Other deformities of toe(s) (acquired), right foot: Secondary | ICD-10-CM | POA: Diagnosis not present

## 2023-09-15 DIAGNOSIS — M2021 Hallux rigidus, right foot: Secondary | ICD-10-CM | POA: Diagnosis not present

## 2023-09-15 MED ORDER — OXYCODONE-ACETAMINOPHEN 5-325 MG PO TABS: 1.0000 | ORAL_TABLET | Freq: Three times a day (TID) | ORAL | 0 refills | Status: DC | PRN
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
## 2023-09-19 ENCOUNTER — Other Ambulatory Visit: Payer: Self-pay | Admitting: Student

## 2023-09-19 DIAGNOSIS — R21 Rash and other nonspecific skin eruption: Secondary | ICD-10-CM

## 2023-10-06 ENCOUNTER — Ambulatory Visit: Payer: MEDICAID | Admitting: Orthopedic Surgery

## 2023-10-07 ENCOUNTER — Ambulatory Visit: Payer: MEDICAID | Admitting: Orthopedic Surgery

## 2023-10-15 ENCOUNTER — Encounter (HOSPITAL_COMMUNITY): Payer: Medicaid Other | Admitting: Physician Assistant

## 2023-10-16 ENCOUNTER — Telehealth (HOSPITAL_COMMUNITY): Payer: Self-pay

## 2023-10-16 ENCOUNTER — Ambulatory Visit: Payer: MEDICAID | Admitting: Orthopedic Surgery

## 2023-10-16 NOTE — Telephone Encounter (Signed)
Pt presented to [facility] OP demanding to cancel an appointment scheduled for the same day (1 hour from time of arrival). Front office staff advised that it will be considered a No Show. At that time the pt became loud and verbally aggressive demanding to see a supervisor and using foul language. Due to hand gestures and the manner to which she was speaking security was contacted. The pt then became even more loud and addressing the front office staff with additional foul language and threats. Front office staff member came to [facility] requesting that I speak with the patient. I advised that the front office staff member stay behind the locked door in the check out area and I addressed the patient with Security present. She again became loud and aggressive, demanding to cancel the appointment and reschedule. I told her that I would make another appt for her, however I need to advise, as the front office staff member did, that the appointment would be considered a no show. She again yelled and said that she "never liked her a** and that she can really show her what a a** could act like." I then told the patient that we do not stand for patients making threats towards staff and that is grounds for dismissal. She said that it wasn't a threat and she can make a real threat if I wanted her to. I again told her threats will not be tolerated and then proceeded to No Show the appointment and reschedule for 2 months out, per patients request. Pt was given a print out with both her Med Management appt and Therapy appt dates on it and she was escorted out via security.

## 2023-10-17 ENCOUNTER — Telehealth: Payer: Self-pay | Admitting: Orthopedic Surgery

## 2023-10-17 MED ORDER — OXYCODONE-ACETAMINOPHEN 5-325 MG PO TABS: 1.0000 | ORAL_TABLET | Freq: Two times a day (BID) | ORAL | 0 refills | Status: DC | PRN

## 2023-10-17 NOTE — Telephone Encounter (Signed)
Patient aware.

## 2023-10-17 NOTE — Telephone Encounter (Signed)
Pt is requesting medication for pain due to her twisting her ankle trying to get out of the car. Pt requesting a phone call at [phone].

## 2023-10-17 NOTE — Telephone Encounter (Signed)
Rx sent 

## 2023-10-21 ENCOUNTER — Telehealth: Payer: Self-pay | Admitting: Orthopedic Surgery

## 2023-10-21 ENCOUNTER — Other Ambulatory Visit: Payer: Self-pay
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
## 2023-10-21 NOTE — Telephone Encounter (Signed)
Called and sw pt to advise that letter is at the front desk for pick up

## 2023-10-29 ENCOUNTER — Other Ambulatory Visit: Payer: Self-pay | Admitting: Orthopedic Surgery

## 2023-10-29 ENCOUNTER — Other Ambulatory Visit: Payer: Self-pay | Admitting: Student

## 2023-10-29 ENCOUNTER — Other Ambulatory Visit (HOSPITAL_COMMUNITY): Payer: Self-pay | Admitting: Psychiatry

## 2023-10-29 DIAGNOSIS — R21 Rash and other nonspecific skin eruption: Secondary | ICD-10-CM

## 2023-10-29 DIAGNOSIS — F3112 Bipolar disorder, current episode manic without psychotic features, moderate: Secondary | ICD-10-CM

## 2023-10-30 ENCOUNTER — Other Ambulatory Visit: Payer: Self-pay | Admitting: Student

## 2023-10-30 DIAGNOSIS — L989 Disorder of the skin and subcutaneous tissue, unspecified: Secondary | ICD-10-CM

## 2023-10-30 DIAGNOSIS — I10 Essential (primary) hypertension: Secondary | ICD-10-CM

## 2023-11-25 ENCOUNTER — Ambulatory Visit: Payer: MEDICAID | Admitting: Student

## 2023-11-25 ENCOUNTER — Encounter: Payer: Self-pay | Admitting: Student

## 2023-11-25 VITALS — BP 131/85 | Wt 153.8 lb

## 2023-11-25 DIAGNOSIS — Z131 Encounter for screening for diabetes mellitus: Secondary | ICD-10-CM

## 2023-11-25 DIAGNOSIS — E785 Hyperlipidemia, unspecified: Secondary | ICD-10-CM | POA: Diagnosis not present

## 2023-11-25 DIAGNOSIS — I10 Essential (primary) hypertension: Secondary | ICD-10-CM

## 2023-11-25 DIAGNOSIS — R32 Unspecified urinary incontinence: Secondary | ICD-10-CM | POA: Diagnosis not present

## 2023-11-25 DIAGNOSIS — R7303 Prediabetes: Secondary | ICD-10-CM | POA: Insufficient documentation

## 2023-11-25 DIAGNOSIS — R21 Rash and other nonspecific skin eruption: Secondary | ICD-10-CM

## 2023-11-25 LAB — POCT GLYCOSYLATED HEMOGLOBIN (HGB A1C): Hemoglobin A1C: 5.8 % — AB (ref 4.0–5.6)

## 2023-11-25 MED ORDER — ATORVASTATIN CALCIUM 10 MG PO TABS: 10.0000 mg | ORAL_TABLET | Freq: Every day | ORAL | 3 refills | Status: DC

## 2023-11-25 MED ORDER — AMLODIPINE BESYLATE 10 MG PO TABS
ORAL_TABLET | ORAL | 2 refills | Status: AC
Start: 2023-11-25 — End: ?

## 2023-11-25 MED ORDER — VENTOLIN HFA 108 (90 BASE) MCG/ACT IN AERS: 2.0000 | INHALATION_SPRAY | Freq: Four times a day (QID) | RESPIRATORY_TRACT | 1 refills | Status: DC | PRN

## 2023-11-25 MED ORDER — HYDROXYZINE HCL 25 MG PO TABS: 25.0000 mg | ORAL_TABLET | Freq: Three times a day (TID) | ORAL | 0 refills | Status: DC

## 2023-11-25 MED ORDER — HYDROCHLOROTHIAZIDE 25 MG PO TABS
ORAL_TABLET | ORAL | 1 refills | Status: AC
Start: 2023-11-25 — End: ?

## 2023-11-25 NOTE — Patient Instructions (Addendum)
It was great to see you today! Thank you for choosing [facility] for your primary care.  Today we addressed: I have reordered your medications. I will fill out your DME form for your urinary incontinence supplies today. As you requested, the number for [facility] is [phone]. At least this is the number I have in your records.  I have verified that you did in fact receive your flu and COVID vaccination this year. Your blood pressure is in good range.   If you haven't already, sign up for My Chart to have easy access to your labs results, and communication with your primary care physician.  No follow-ups on file. Please arrive 15 minutes before your appointment to ensure smooth check in process.  We appreciate your efforts in making this happen.  Thank you for allowing me to participate in your care, [name], DO 11/25/2023, 9:41 AM PGY-3, [facility]

## 2023-11-25 NOTE — Progress Notes (Signed)
  SUBJECTIVE:   CHIEF COMPLAINT / HPI:   Urinary incontinence: Requesting completion of DME form.  This is a chronic issue for her, previous unremarkable workup.  She requires incontinence supplies for prevention of deterioration of skin.  Requested medication refills today.  PERTINENT  PMH / PSH: HTN, COPD, restrictive lung disease, chronic pain syndrome, OA, bladder disorder  OBJECTIVE:  BP 131/85   Wt 153 lb 12.8 oz (69.8 kg)   SpO2 96%   BMI 25.59 kg/m  Gen: well-appearing, NAD CV: RRR, no murmurs auscultated  ASSESSMENT/PLAN:   Assessment & Plan Urinary incontinence, unspecified type Chronic in nature with negative prior workup. Requires incontinence supplies for this.  Essential hypertension BP: 131/85 today. Well controlled. Goal of <140/90. Medication regimen: amlodipine 10mg  daily, hydrochlorothiazide 25mg  daily  Hyperlipidemia, unspecified hyperlipidemia type Refilled atorvastatin. Recheck lipid panel in April.  Screening for diabetes mellitus (DM) Obtain A1c. Previously normal results in the last 3 years.  Return if symptoms worsen or fail to improve. [name], DO 11/25/2023, 5:05 PM PGY-3, [facility]

## 2023-11-25 NOTE — Assessment & Plan Note (Signed)
BP: 131/85 today. Well controlled. Goal of <140/90. Medication regimen: amlodipine 10mg  daily, hydrochlorothiazide 25mg  daily

## 2023-11-25 NOTE — Assessment & Plan Note (Signed)
Refilled atorvastatin. Recheck lipid panel in April.

## 2023-11-25 NOTE — Assessment & Plan Note (Addendum)
Chronic in nature with negative prior workup. Requires incontinence supplies for this.

## 2023-11-26 ENCOUNTER — Telehealth: Payer: Self-pay | Admitting: Student

## 2023-11-26 NOTE — Telephone Encounter (Signed)
Patient is calling stating she is needing a script called in to [facility] for a Rollacor Chair and Pampers.   Please Advise.   Thanks!

## 2023-11-26 NOTE — Telephone Encounter (Signed)
Called patient and LVM concerning urinary supplies.  Advised patient that she would need to make an appointment to discuss her need for a Rollator Chair as she is ambulating and there is no need.  Glennie Hawk, CMA

## 2023-12-04 ENCOUNTER — Telehealth: Payer: Self-pay

## 2023-12-04 NOTE — Telephone Encounter (Signed)
Patient calls nurse line regarding results from visit on 11/25/23.  Advised patient of A1c results. She is asking if we can get her scheduled with nutritionist. Advised that Dr. Gerilyn Pilgrim has retired and that I could send a message to PCP regarding referral for nutritionist.   Patient requests that this referral be placed and that we notify her with where the referral is sent to. She prefers that this is completed in January. Advised patient that referrals can take several weeks to process and that specialty offices can be booked out.   Patient verbalizes understanding.   Will route request to PCP.   Veronda Prude, RN

## 2023-12-05 ENCOUNTER — Telehealth: Payer: Self-pay | Admitting: Student

## 2023-12-05 ENCOUNTER — Other Ambulatory Visit: Payer: Self-pay | Admitting: Student

## 2023-12-05 DIAGNOSIS — R7303 Prediabetes: Secondary | ICD-10-CM

## 2023-12-05 NOTE — Telephone Encounter (Signed)
Patient would like referral to a nutritionist. Please advise.

## 2023-12-11 ENCOUNTER — Ambulatory Visit (HOSPITAL_COMMUNITY): Payer: MEDICAID | Admitting: Mental Health

## 2023-12-11 DIAGNOSIS — F3112 Bipolar disorder, current episode manic without psychotic features, moderate: Secondary | ICD-10-CM
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
## 2023-12-16 ENCOUNTER — Encounter (HOSPITAL_COMMUNITY): Payer: MEDICAID | Admitting: Physician Assistant

## 2023-12-18 LAB — HM DIABETES EYE EXAM

## 2023-12-19 ENCOUNTER — Other Ambulatory Visit: Payer: Self-pay

## 2023-12-19 MED ORDER — VENTOLIN HFA 108 (90 BASE) MCG/ACT IN AERS: 2.0000 | INHALATION_SPRAY | Freq: Four times a day (QID) | RESPIRATORY_TRACT | 1 refills | Status: DC | PRN

## 2023-12-19 NOTE — Telephone Encounter (Signed)
 Received call from pharmacist regarding Ventolin prescription.   Reports that Dr. Royal Piedra is not enrolled in Medicaid and needs another provider to send in prescription.   Forwarding to AM preceptor.   Veronda Prude, RN

## 2023-12-22 ENCOUNTER — Other Ambulatory Visit: Payer: Self-pay | Admitting: Student

## 2023-12-22 DIAGNOSIS — Z1231 Encounter for screening mammogram for malignant neoplasm of breast: Secondary | ICD-10-CM

## 2023-12-23 ENCOUNTER — Telehealth (HOSPITAL_COMMUNITY): Payer: Self-pay | Admitting: Mental Health

## 2023-12-23 NOTE — Telephone Encounter (Signed)
 Therapist received several voicemail from pt inuring about her upcoming appointments with [facility] OP. Therapist returned call; no answer. Left HIPAA compliant message informing of next scheduled appointment for medication management as well as OPT.

## 2023-12-24 ENCOUNTER — Ambulatory Visit (INDEPENDENT_AMBULATORY_CARE_PROVIDER_SITE_OTHER): Payer: MEDICAID | Admitting: Family

## 2023-12-24 DIAGNOSIS — M21371 Foot drop, right foot: Secondary | ICD-10-CM

## 2023-12-31 ENCOUNTER — Telehealth (INDEPENDENT_AMBULATORY_CARE_PROVIDER_SITE_OTHER): Payer: Medicaid Other | Admitting: Physician Assistant

## 2023-12-31 DIAGNOSIS — F3112 Bipolar disorder, current episode manic without psychotic features, moderate: Secondary | ICD-10-CM | POA: Diagnosis not present

## 2023-12-31 MED ORDER — QUETIAPINE FUMARATE 50 MG PO TABS
50.0000 mg | ORAL_TABLET | Freq: Every day | ORAL | 1 refills | Status: DC
Start: 2023-12-31 — End: 2024-03-17

## 2023-12-31 NOTE — Progress Notes (Unsigned)
Office Visit Note   Patient: Rhonda Dominguez           Date of Birth: 02-22-1961           MRN: [id number] Visit Date: 12/24/2023              Requested by: [name], DO [address] [place],  [place] [address] PCP: [name], DO  Chief Complaint  Patient presents with   Right Foot - Follow-up    Right GT MTP fusion 07/2022      HPI: The patient is a 63 year old woman who presents today for with concern for discomfort to her right foot and ankle.  She currently has an AFO which has been fabricated by [facility].  She states that she is unhappy with this AFO that it slides out of her shoe she is requesting referral to a different orthotist.    She also has many other concerns she would like to discuss with Dr. Lajoyce Corners  Assessment & Plan: Visit Diagnoses: No diagnosis found.  Plan: Discussed her chronic foot and ankle discomfort as well as her AFO at length.  Offered a cam boot as well as referral for new insert.  She will plan to discuss all of these concerns with Dr. Lajoyce Corners at her next appointment  Follow-Up Instructions: No follow-ups on file.   Ortho Exam  Patient is alert, oriented, no adenopathy, well-dressed, normal affect, normal respiratory effort. On examination right lower extremity the patient does have good plantarflexion strength.  Some weakness with dorsiflexion has dorsiflexion just shy of neutral.  Clawing of the second and third toes.  The fusion site is stable.  No erythema warmth or impending skin breakdown  Imaging: No results found. No images are attached to the encounter.  Labs: Lab Results  Component Value Date   HGBA1C 5.8 (A) 11/25/2023   HGBA1C 5.7 06/19/2020   HGBA1C 6.0 03/14/2020   ESRSEDRATE 20 10/23/2017   CRP 12.8 (H) 10/23/2017     Lab Results  Component Value Date   ALBUMIN 4.2 03/24/2023   ALBUMIN 4.6 10/23/2017   ALBUMIN 4.0 10/11/2014    Lab Results  Component Value Date   MG 2.5 (H) 10/23/2017   MG 2.1  08/26/2007   No results found for: "VD25OH"  No results found for: "PREALBUMIN"    Latest Ref Rng & Units 08/07/2022    6:30 AM 12/12/2021    9:45 AM 06/19/2020    3:49 PM  CBC EXTENDED  WBC 4.0 - 10.5 K/uL 7.3  6.6  7.4   RBC 3.87 - 5.11 MIL/uL 4.59  4.73  4.90   Hemoglobin 12.0 - 15.0 g/dL 62.1  30.8  65.7   HCT 36.0 - 46.0 % 37.4  37.8  39.2   Platelets 150 - 400 K/uL 225  246  254      There is no height or weight on file to calculate BMI.  Orders:  No orders of the defined types were placed in this encounter.  No orders of the defined types were placed in this encounter.    Procedures: No procedures performed  Clinical Data: No additional findings.  ROS:  All other systems negative, except as noted in the HPI. Review of Systems  Objective: Vital Signs: There were no vitals taken for this visit.  Specialty Comments:  No specialty comments available.  PMFS History: Patient Active Problem List   Diagnosis Date Noted   Prediabetes 11/25/2023   Medication management 04/15/2023  Hyperlipidemia 04/07/2023   Breast lump 03/24/2023   Encounter for annual physical exam 05/28/2022   Prurigo nodularis 12/27/2021   Nutritional counseling 07/03/2020   GERD (gastroesophageal reflux disease) 06/19/2020   Paronychia of right middle finger 05/19/2020   COPD (chronic obstructive pulmonary disease) (HCC) 12/08/2019   Loud snoring 07/01/2019   Neurotic excoriations 05/20/2019   Skin lesions, generalized 10/16/2018   Complex regional pain syndrome type 1 of lower extremity (Right) 11/10/2017   Cocaine abuse (HCC) 11/10/2017   History of tobacco abuse 11/10/2017   Osteoarthritis 11/10/2017   Chronic shoulder pain (Bilateral) (L>R) 11/10/2017   Dextroscoliosis 11/10/2017   DDD (degenerative disc disease), cervical 11/10/2017   Knee pain, chronic (Secondary Area of Pain) (right) 11/09/2017   Chronic lower extremity pain [facility] Area of Pain) (Right) 10/23/2017    Chronic pain syndrome 10/23/2017   Hallux rigidus, right foot 10/23/2017   Urinary incontinence 11/20/2016   Restrictive lung disease 07/23/2016   Essential hypertension 03/22/2016   High risk bisexual behavior 05/18/2015   CATARACTS, BILATERAL 06/24/2007   Bipolar affective disorder (HCC) 10/03/2006   Constipation 10/03/2006   Past Medical History:  Diagnosis Date   Bipolar disorder (HCC)    Depression    Drug abuse (HCC)    Hepatitis    Hypertension    Pneumothorax    Pre-diabetes     No family history on file.  Past Surgical History:  Procedure Laterality Date   ARTHRODESIS METATARSALPHALANGEAL JOINT (MTPJ) Right 08/07/2022   Procedure: FUSION RIGHT GREAT TOE METATARSOPHALANGEAL JOINT;  Surgeon: Nadara Mustard, MD;  Location[facility];  Service: Orthopedics;  Laterality: Right;   CATARACT EXTRACTION Bilateral    Social History   Occupational History   Not on file  Tobacco Use   Smoking status: Former    Current packs/day: 0.00    Average packs/day: 1 pack/day for 40.2 years (40.2 ttl pk-yrs)    Types: Cigarettes    Start date: 12/09/1978    Quit date: 03/10/2019    Years since quitting: 4.8    Passive exposure: Past   Smokeless tobacco: Never   Tobacco comments:    Smoked 1-2 ppd for 40 years  QUIT 02/2019  Substance and Sexual Activity   Alcohol use: No    Comment: none in 8 years   Drug use: No    Comment: clean 8 years   Sexual activity: Not on file

## 2023-12-31 NOTE — Progress Notes (Signed)
BH MD/PA/NP OP Progress Note  Virtual Visit via Video Note  I connected with Rhonda Dominguez on 12/31/23 at 10:30 AM EST by a video enabled telemedicine application and verified that I am speaking with the correct person using two identifiers.  Location: Patient: Home Provider: Clinic   I discussed the limitations of evaluation and management by telemedicine and the availability of in person appointments. The patient expressed understanding and agreed to proceed.  Follow Up Instructions:  I discussed the assessment and treatment plan with the patient. The patient was provided an opportunity to ask questions and all were answered. The patient agreed with the plan and demonstrated an understanding of the instructions.   The patient was advised to call back or seek an in-person evaluation if the symptoms worsen or if the condition fails to improve as anticipated.  I provided 14 minutes of non-face-to-face time during this encounter.  Meta Hatchet, PA    12/31/2023 5:36 PM JAMISHA HOESCHEN  MRN:  [id number]  Chief Complaint: No chief complaint on file.  HPI:   Rhonda Dominguez. Rhonda Dominguez is a 63 year old female with a past psychiatric history significant for bipolar affective disorder who presents to [facility] via virtual video visit for follow-up and medication management.  Patient was last seen by this provider on 05/27/2023.  During her last encounter, patient was being managed on the following psychiatric medication: Seroquel 50 mg at bedtime.  Patient reports that her use of Seroquel was okay when she started it after her last encounter.  She reports no issues or concerns regarding her use of the medication and would like to continue taking the medication as prescribed.  Patient reports that the only issue that she has regarding her health is with her foot/toe.  She reports that she has to make the decision whether or not to take the bone out of  her foot or to remove her toe.  Patient denies experiencing any overt depressive symptoms and appears to be stable at this time.  A GAD-7 screen was performed with the patient scoring a 7.  Patient is alert and oriented x 4, calm, cooperative, and fully engaged in conversation during the encounter.  Patient endorses good mood.  Patient exhibits euthymic mood with appropriate affect.  Patient denies suicidal or homicidal ideations.  She further denies auditory or visual hallucinations and does not appear to be responding to internal/external stimuli.  Patient endorses good sleep and states that she sleeps all the time.  Patient endorses increased appetite and eats on average 3-5 meals per day.  Patient denies alcohol consumption.  Patient endorses tobacco use and smokes sparingly.  Patient denies illicit drug use.  Visit Diagnosis:    ICD-10-CM   1. Bipolar affective disorder, currently manic, moderate (HCC)  F31.12 QUEtiapine (SEROQUEL) 50 MG tablet      Past Psychiatric History:  Patient endorses a past history of bipolar disorder   Past Medical History:  Past Medical History:  Diagnosis Date   Bipolar disorder (HCC)    Depression    Drug abuse (HCC)    Hepatitis    Hypertension    Pneumothorax    Pre-diabetes     Past Surgical History:  Procedure Laterality Date   ARTHRODESIS METATARSALPHALANGEAL JOINT (MTPJ) Right 08/07/2022   Procedure: FUSION RIGHT GREAT TOE METATARSOPHALANGEAL JOINT;  Surgeon: Nadara Mustard, MD;  Location: [facility] OR;  Service: Orthopedics;  Laterality: Right;   CATARACT EXTRACTION Bilateral  Family Psychiatric History:  Patient reports that most of her family members are deceased   Family history of suicide attempts: Patient denies Family history of homicide attempts: Patient denies  Family History: No family history on file.  Social History:  Social History   Socioeconomic History   Marital status: Legally Separated    Spouse name: Not on file    Number of children: Not on file   Years of education: Not on file   Highest education level: Not on file  Occupational History   Not on file  Tobacco Use   Smoking status: Former    Current packs/day: 0.00    Average packs/day: 1 pack/day for 40.2 years (40.2 ttl pk-yrs)    Types: Cigarettes    Start date: 12/09/1978    Quit date: 03/10/2019    Years since quitting: 4.8    Passive exposure: Past   Smokeless tobacco: Never   Tobacco comments:    Smoked 1-2 ppd for 40 years  QUIT 02/2019  Substance and Sexual Activity   Alcohol use: No    Comment: none in 8 years   Drug use: No    Comment: clean 8 years   Sexual activity: Not on file  Other Topics Concern   Not on file  Social History Narrative   Not on file   Social Drivers of Health   Financial Resource Strain: High Risk (03/04/2023)   Overall Financial Resource Strain (CARDIA)    Difficulty of Paying Living Expenses: Hard  Food Insecurity: Food Insecurity Present (03/04/2023)   Hunger Vital Sign    Worried About Running Out of Food in the Last Year: Sometimes true    Ran Out of Food in the Last Year: Sometimes true  Transportation Needs: Unmet Transportation Needs (03/04/2023)   PRAPARE - Transportation    Lack of Transportation (Medical): No    Lack of Transportation (Non-Medical): Yes  Physical Activity: Inactive (03/04/2023)   Exercise Vital Sign    Days of Exercise per Week: 0 days    Minutes of Exercise per Session: 0 min  Stress: Stress Concern Present (03/04/2023)   [employer] of Occupational Health - Occupational Stress Questionnaire    Feeling of Stress : Very much  Social Connections: Socially Isolated (03/04/2023)   Social Connection and Isolation Panel [NHANES]    Frequency of Communication with Friends and Family: More than three times a week    Frequency of Social Gatherings with Friends and Family: Never    Attends Religious Services: Never    Database administrator or Organizations: No    Attends  Engineer, structural: Never    Marital Status: Never married    Allergies: No Known Allergies  Metabolic Disorder Labs: Lab Results  Component Value Date   HGBA1C 5.8 (A) 11/25/2023   No results found for: "PROLACTIN" Lab Results  Component Value Date   CHOL 183 03/24/2023   TRIG 161 (H) 03/24/2023   HDL 79 03/24/2023   CHOLHDL 2.3 03/24/2023   VLDL 21 06/14/2016   LDLCALC 77 03/24/2023   LDLCALC 84 07/31/2021   Lab Results  Component Value Date   TSH 1.274 01/08/2016   TSH 0.727 08/26/2007    Therapeutic Level Labs: No results found for: "LITHIUM" No results found for: "VALPROATE" No results found for: "CBMZ"  Current Medications: Current Outpatient Medications  Medication Sig Dispense Refill   amLODipine (NORVASC) 10 MG tablet TAKE 1 TABLET(10 MG) BY MOUTH DAILY FOR BLOOD PRESSURE 90 tablet 2  atorvastatin (LIPITOR) 10 MG tablet Take 1 tablet (10 mg total) by mouth daily. 90 tablet 3   gabapentin (NEURONTIN) 300 MG capsule Take 1 capsule (300 mg total) by mouth 3 (three) times daily. 90 capsule 3   hydrochlorothiazide (HYDRODIURIL) 25 MG tablet TAKE 1 TABLET(25 MG) BY MOUTH DAILY FOR BLOOD PRESSURE 90 tablet 1   hydrocortisone 2.5 % cream APPLY TOPICALLY TWICE DAILY 453.6 g 1   hydrOXYzine (ATARAX) 25 MG tablet Take 1 tablet (25 mg total) by mouth 3 (three) times daily. 90 tablet 0   lamoTRIgine (LAMICTAL) 25 MG tablet Take 1 tablet (25 mg total) by mouth daily. 30 tablet 1   polyethylene glycol powder (GLYCOLAX/MIRALAX) 17 GM/SCOOP powder DISSOLVE 1 CAPFUL( 17 GRAMS) INTO LIQUID AND DRINK BY MOUTH DAILY 238 g 11   predniSONE (DELTASONE) 10 MG tablet TAKE 1 TABLET(10 MG) BY MOUTH DAILY WITH BREAKFAST 30 tablet 0   QUEtiapine (SEROQUEL) 50 MG tablet Take 1 tablet (50 mg total) by mouth at bedtime. 30 tablet 1   triamcinolone cream (KENALOG) 0.1 % Apply 1 Application topically 2 (two) times daily. 45 g 2   umeclidinium-vilanterol (ANORO ELLIPTA) 62.5-25  MCG/ACT AEPB One inhalation daily 1 each 11   VENTOLIN HFA 108 (90 Base) MCG/ACT inhaler Inhale 2 puffs into the lungs every 6 (six) hours as needed for wheezing or shortness of breath. 18 g 1   No current facility-administered medications for this visit.     Musculoskeletal: Strength & Muscle Tone: within normal limits Gait & Station: normal Patient leans: N/A  Psychiatric Specialty Exam: Review of Systems  Psychiatric/Behavioral:  Negative for decreased concentration, dysphoric mood, hallucinations, self-injury, sleep disturbance and suicidal ideas. The patient is not nervous/anxious and is not hyperactive.     There were no vitals taken for this visit.There is no height or weight on file to calculate BMI.  General Appearance: Casual  Eye Contact:  Good  Speech:  Clear and Coherent and Normal Rate  Volume:  Normal  Mood:  Euthymic  Affect:  Appropriate  Thought Process:  Coherent, Goal Directed, and Descriptions of Associations: Intact  Orientation:  Full (Time, Place, and Person)  Thought Content: WDL   Suicidal Thoughts:  No  Homicidal Thoughts:  No  Memory:  Immediate;   Good Recent;   Good Remote;   Good  Judgement:  Good  Insight:  Good  Psychomotor Activity:  Normal  Concentration:  Concentration: Good and Attention Span: Good  Recall:  Good  Fund of Knowledge: Good  Language: Good  Akathisia:  No  Handed:  Right  AIMS (if indicated): not done  Assets:  Communication Skills Desire for Improvement Financial Resources/Insurance Housing  ADL's:  Intact  Cognition: WNL  Sleep:  Good   Screenings: AUDIT    Flowsheet Row Office Visit from 03/04/2023 in [facility]  Alcohol Use Disorder Identification Test Final Score (AUDIT) 4      GAD-7    Flowsheet Row Video Visit from 12/31/2023 in [facility] Clinical Support from 05/27/2023 in [facility]  Total GAD-7 Score 7 0       PHQ2-9    Flowsheet Row Video Visit from 12/31/2023 in [facility] Clinical Support from 05/27/2023 in [facility] Office Visit from 04/07/2023 in [facility] Office Visit from 04/02/2023 in [facility] Office Visit  from 03/24/2023 in [facility] [place]  PHQ-2 Total Score 0 2 1 0 0  PHQ-9 Total Score -- 2 2 -- 2      Flowsheet Row Video Visit from 12/31/2023 in [facility] Clinical Support from 05/27/2023 in [facility] Office Visit from 04/02/2023 in [facility]  C-SSRS RISK CATEGORY No Risk No Risk No Risk        Assessment and Plan:   Rhonda Dominguez. Rhonda Dominguez is a 63 year old female with a past psychiatric history significant for bipolar affective disorder who presents to [facility] via virtual video visit for follow-up and medication management.  Patient presents to the encounter stating that she tolerated Seroquel after her last encounter.  Patient denies experiencing any adverse side effects and would like to continue taking the medication as prescribed.  Patient denies overt depressive symptoms nor does she endorse anxiety.  Patient endorses stability through her use of Seroquel.  Patient's medication to be e-prescribed to pharmacy of choice.  Provider informed patient that a hemoglobin A1c would need to be performed due to her use of Seroquel.  Patient vocalized understanding.  Provider to obtain a lipid profile, complete metabolic panel, and complete blood count with differential after patient's. next encounter.  Collaboration of Care: Collaboration of Care: Medication Management AEB provider managing patient's psychiatric medication, Primary Care Provider AEB patient being  followed by family medicine, Psychiatrist AEB patient being followed by mental health provider at this facility, Other provider involved in patient's care AEB patient being followed by orthopedics, and Referral or follow-up with counselor/therapist AEB patient being seen by licensed clinical social worker at this facility  Patient/Guardian was advised Release of Information must be obtained prior to any record release in order to collaborate their care with an outside provider. Patient/Guardian was advised if they have not already done so to contact the registration department to sign all necessary forms in order for [place] to release information regarding their care.   Consent: Patient/Guardian gives verbal consent for treatment and assignment of benefits for services provided during this visit. Patient/Guardian expressed understanding and agreed to proceed.   1. Bipolar affective disorder, currently manic, moderate (HCC)  - QUEtiapine (SEROQUEL) 50 MG tablet; Take 1 tablet (50 mg total) by mouth at bedtime.  Dispense: 30 tablet; Refill: 1  Patient to follow up in 2 months Provider spent a total of 14 minutes with the patient/reviewing the patient's chart  Meta Hatchet, PA 12/31/2023, 5:36 PM

## 2024-01-06 ENCOUNTER — Encounter (HOSPITAL_COMMUNITY): Payer: Self-pay | Admitting: Physician Assistant

## 2024-01-07 ENCOUNTER — Encounter: Payer: Self-pay | Admitting: Family

## 2024-01-08 ENCOUNTER — Encounter: Payer: MEDICAID | Attending: Family Medicine | Admitting: Dietician

## 2024-01-08 ENCOUNTER — Encounter: Payer: Self-pay | Admitting: Dietician

## 2024-01-08 VITALS — Ht 67.0 in | Wt 151.0 lb

## 2024-01-08 DIAGNOSIS — R7303 Prediabetes: Secondary | ICD-10-CM | POA: Insufficient documentation
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
## 2024-01-08 NOTE — Patient Instructions (Addendum)
Prediabetes: Prediabetes is a condition where blood sugar levels are higher than normal but not yet high enough to be diagnosed as type 2 diabetes. A1C, or hemoglobin A1c, is a blood test that provides an average of a person's blood sugar levels over the past two to three months. It is commonly used to diagnose and monitor diabetes. For prediabetes, an A1C level between 5.7% and 6.4% typically is used to diagnose this. Here is how the A1C levels are generally categorized: Normal:  A1C below 5.7% Prediabetes:  A1C between 5.7% and 6.4% Diabetes:  A1C of 6.5% or higher When diagnosed with prediabetes, there are several lifestyle changes you can make to manage the condition: Healthy Eating:  Follow a well-balanced diet that includes a variety of fruits, vegetables, whole grains, lean proteins, and healthy fats. Avoid/limit fried food - baked, boiled is fine, air fried is also fine Monitor portion sizes and reduce intake of sugary and processed foods. Regular Physical Activity:  Engage in regular physical activity, such as brisk walking, cycling, or other aerobic exercises, for at least 150 minutes per week. Include strength training exercises at least twice a week. Weight Management: Achieve and maintain a healthy weight. Losing even a small amount of weight (3-5%) can significantly improve insulin sensitivity.   Consider getting a Pulse Ox (They are about $11 at [facility]) - ask your doctor about your your goal Consider multivitamin with vitamin D. Avoid regular soda - Drink plenty of water.  Herbal tea (peach tea or others), decaf green tea, sugar free flavored water are fine. Eat plenty of vegetables every day

## 2024-01-21 ENCOUNTER — Telehealth: Payer: Self-pay | Admitting: Student

## 2024-01-21 NOTE — Telephone Encounter (Signed)
Patient calls requesting a prescription for a sugar monitor. Her case manager told her she needs to request this from her PCP.   Patient also requesting a life alert button at her home. She said she can get one though the same place that gives her diapers.   Please call patient back at number in chart regarding this.

## 2024-01-22 ENCOUNTER — Other Ambulatory Visit: Payer: Self-pay | Admitting: Student

## 2024-01-22 NOTE — Telephone Encounter (Signed)
Discussed with patient and she stated do not worry about the life alert.  She was under the impression that she had diabetes and therefore needs a glucose monitor.  I informed her that she does not have diabetes.  She has prediabetes and a glucose monitor would not be covered by her insurance for this.  I advised her that I would let her know if she ever needed a glucose monitor.  She was appreciative of call.

## 2024-01-22 NOTE — Telephone Encounter (Signed)
Attempted call x1. Left VM. Will re-attempt this afternoon.

## 2024-01-27 ENCOUNTER — Other Ambulatory Visit: Payer: Self-pay

## 2024-01-27 MED ORDER — POLYETHYLENE GLYCOL 3350 17 GM/SCOOP PO POWD: ORAL | 11 refills | Status: DC

## 2024-02-02 ENCOUNTER — Other Ambulatory Visit: Payer: Self-pay | Admitting: Student

## 2024-02-02 ENCOUNTER — Telehealth (HOSPITAL_COMMUNITY): Payer: Self-pay

## 2024-02-02 DIAGNOSIS — R21 Rash and other nonspecific skin eruption: Secondary | ICD-10-CM

## 2024-02-02 NOTE — Telephone Encounter (Signed)
 PT called while the office was closed for lunch and LVM stating she has the flu and cannot make tomorrows appointment - when calling PT back I asked if kynslee was available - PT started screaming over the phone stating dont you all ever work I have called all morning and you never answer the phone what is the problem - I am going to speak with the big dogs when I come in there because yall are never working, I proceeded to give her an appointment date and time ( trying to not entertain the screaming ) and she again continued screaming - I did tell the Patient that she did not need to scream at me - PT proceeded to tell me she didn't want to speak to me noway and hung up the phone.

## 2024-02-03 ENCOUNTER — Other Ambulatory Visit: Payer: Self-pay | Admitting: Student

## 2024-02-04 ENCOUNTER — Ambulatory Visit (HOSPITAL_COMMUNITY): Payer: MEDICAID | Admitting: Mental Health

## 2024-02-05 ENCOUNTER — Telehealth: Payer: Self-pay | Admitting: Student

## 2024-02-05 NOTE — Telephone Encounter (Signed)
 Patient called asking if her doctor could please send in a prescription for her to get a cane.

## 2024-02-09 ENCOUNTER — Other Ambulatory Visit: Payer: Self-pay | Admitting: Student

## 2024-02-09 DIAGNOSIS — G8929 Other chronic pain: Secondary | ICD-10-CM

## 2024-02-10 ENCOUNTER — Telehealth (HOSPITAL_COMMUNITY): Payer: Self-pay | Admitting: *Deleted

## 2024-02-10 NOTE — Telephone Encounter (Signed)
 Fax received for PA of Quetiapine 50mg , submitted online with cover my meds. Awaiting decision.

## 2024-02-10 NOTE — Telephone Encounter (Signed)
 Called patient to inform she would need to have office visit discussing the need for medical equipment.   Patient became upset as she states that this has not been required before. Advised that this is an insurance requirement and that we will need this prior to processing order.   Patient has appointment with PCP on 02/25/24. Please include need for cane in this office visit note and then route back to RN team.   Thanks.   Veronda Prude, RN

## 2024-02-11 ENCOUNTER — Telehealth (HOSPITAL_COMMUNITY): Payer: Self-pay | Admitting: *Deleted

## 2024-02-11 NOTE — Telephone Encounter (Signed)
 Fax received for PA approval of Quetiapine 50mg  until 02/09/25. Called to notify pharmacy.

## 2024-02-23 ENCOUNTER — Other Ambulatory Visit: Payer: Self-pay | Admitting: Family Medicine

## 2024-02-24 ENCOUNTER — Other Ambulatory Visit: Payer: Self-pay | Admitting: Student

## 2024-02-24 ENCOUNTER — Encounter: Payer: Self-pay | Admitting: Pediatrics

## 2024-02-24 DIAGNOSIS — R21 Rash and other nonspecific skin eruption: Secondary | ICD-10-CM

## 2024-02-25 ENCOUNTER — Encounter: Payer: Self-pay | Admitting: Student

## 2024-02-25 ENCOUNTER — Ambulatory Visit (INDEPENDENT_AMBULATORY_CARE_PROVIDER_SITE_OTHER): Payer: MEDICAID | Admitting: Student

## 2024-02-25 ENCOUNTER — Ambulatory Visit (INDEPENDENT_AMBULATORY_CARE_PROVIDER_SITE_OTHER): Payer: MEDICAID | Admitting: Pharmacist

## 2024-02-25 ENCOUNTER — Encounter: Payer: Self-pay | Admitting: Pharmacist

## 2024-02-25 VITALS — BP 151/98 | HR 68

## 2024-02-25 VITALS — BP 140/83 | HR 67 | Ht 67.0 in | Wt 153.6 lb

## 2024-02-25 DIAGNOSIS — Z87891 Personal history of nicotine dependence: Secondary | ICD-10-CM

## 2024-02-25 DIAGNOSIS — Z79899 Other long term (current) drug therapy: Secondary | ICD-10-CM | POA: Diagnosis not present

## 2024-02-25 DIAGNOSIS — E785 Hyperlipidemia, unspecified: Secondary | ICD-10-CM

## 2024-02-25 DIAGNOSIS — H6123 Impacted cerumen, bilateral: Secondary | ICD-10-CM

## 2024-02-25 DIAGNOSIS — G8929 Other chronic pain: Secondary | ICD-10-CM

## 2024-02-25 DIAGNOSIS — Z113 Encounter for screening for infections with a predominantly sexual mode of transmission: Secondary | ICD-10-CM

## 2024-02-25 DIAGNOSIS — J449 Chronic obstructive pulmonary disease, unspecified: Secondary | ICD-10-CM

## 2024-02-25 DIAGNOSIS — M79604 Pain in right leg: Secondary | ICD-10-CM

## 2024-02-25 MED ORDER — FLUTICASONE PROPIONATE 50 MCG/ACT NA SUSP: 1.0000 | Freq: Every day | NASAL | 12 refills | Status: AC

## 2024-02-25 MED ORDER — ANORO ELLIPTA 62.5-25 MCG/ACT IN AEPB: INHALATION_SPRAY | RESPIRATORY_TRACT | 11 refills | Status: AC

## 2024-02-25 MED ORDER — DEBROX 6.5 % OT SOLN: 5.0000 [drp] | Freq: Two times a day (BID) | OTIC | 0 refills | Status: AC

## 2024-02-25 NOTE — Assessment & Plan Note (Addendum)
 Known, oxygen saturation appropriate.  She has not been taking her Anoro Ellipta, I have refilled this.  She is to discuss tobacco use with clinical pharmacist Dr. Raymondo Band today.  She does not meet criteria for supplemental O2, I have discussed this with her.

## 2024-02-25 NOTE — Assessment & Plan Note (Signed)
 Monitoring labs for Lamictal and quetiapine, check TSH, CBC, CMP for liver and kidney function.

## 2024-02-25 NOTE — Assessment & Plan Note (Addendum)
 Continues to be managed for chronic pain of her right lower extremity and hallux rigidus of her right foot by orthopedic surgery.  Requiring cane for walking purposes while continuing workup.  Fortunately, she lives on first-floor set of stairs are not a problem.  DME for cane ordered.

## 2024-02-25 NOTE — Assessment & Plan Note (Signed)
 Tobacco use disorder with mild nicotine dependence of 7 years (2018) years duration in a patient who is excellent candidate for success because of patient's confidence to quit for health purposes and previous tobacco free duration of 4-5 years after 2020.

## 2024-02-25 NOTE — Patient Instructions (Signed)
 Nice to see you today!  Medication Changes:  Discontinue Miralax for constipation  START Senna (over-the-counter) for constipation  Continue all other medication the same.   Tobacco Patient Instructions  Quitting smoking is one of the most important decisions you can make for your current and future health. Consider what you dislike about smoking and how quitting could personally benefit you. Try to cut down. Aim for reducing the amount you smoke to 0 cigarettes over the next 6 weeks.  My target quit date is: End of April  Starting today, Be a Quitter!  Remind yourself why you want to quit.  Delay your first cigarette of the day for as long as possible.  Start cleaning out all pockets, drawers, and your car of cigarettes.  Getting Through the Cravings Once You Are Smoke Free: Each craving will last about 10 minutes, whether or not you smoke. Here's how to get through the cravings without cigarettes:  DELAY: Tell yourself that you'll wait for the next craving. Do it every time! DEEP BREATHS: One reason smoking feels good is because you breathe in deeply to inhale. Take four slow, deep breaths and feel the relaxation without the hamful effects of cigarettes. DRINK WATER: Drink a glass of cool water. It will give your hands and mouth something to do and will help flush the nicotine out of your system faster. DIVERT: Do something else -- brush your teeth, take a walk, call a friend who can offer you support. Just moving onto something other than thinking about cigarettes will move you through the craving.   Frequently Asked Questions  What can I do when I get the urge to smoke? To get through the urge to smoke, try the following:  Review your reasons for quitting and think of all the benefits to your health, your finances, and your family.  Remind yourself that there is no such thing as just one cigarette -- or even one puff.  Ride out the desire to smoke. Use the 4 Os -- Delay, Deep  Breaths, Drink Water and Divert to get you through. The craving will go away eventually. Do not fool yourself into thinking you can have just one cigarette.  Any tips on how to deal with stress? Stress is a natural part of life. The key is to deal with it without reaching for a cigarette. Taking deep breaths, counting backwards from 10 and asking yourself 1-how big a deal is this?"  Writing down your feelings, talking with a friend and doing things like positive self-talk and meditation are some other ways that people deal with daily stress.  What if I start smoking again? Slips happen. Most people try to quit smoking a few times before they are successful. Don't beat yourself up if this happens to you! Ask yourself if this was a slip or a relapse. A slip is a one-time mistake that is quickly corrected. A relapse is going back to your old smoking habits.   If you slip, don't give up. Think of it as a learning experience. Ask yourself what went wrong and renew your commitment to staying away from smoking for good.  If you relapse, try not to get discouraged. Ask yourself the question "What caused me to start smoking?" Figure out what helped you and what didn't when you tried to quit. Knowing why you relapsed is useful information for your next attempt to quit.

## 2024-02-25 NOTE — Progress Notes (Signed)
  SUBJECTIVE:   CHIEF COMPLAINT / HPI:   Requesting cain for her foot difficulties and walking in her home.  Requests her ears to be checked as she feels they are dirty.  She states her albuterol inhaler is not working for her COPD and requests oxygen tank.  PERTINENT  PMH / PSH: HTN, COPD, restrictive lung disease, chronic pain syndrome, OA, bladder disorder   OBJECTIVE:  BP (!) 140/83   Pulse 67   Ht 5\' 7"  (1.702 m)   Wt 153 lb 9.6 oz (69.7 kg)   SpO2 100%   BMI 24.06 kg/m  General: Well-appearing, NAD HEENT: Cerumen impaction bilaterally CV: RRR, no murmurs auscultated Pulm: CTAB, normal WOB  ASSESSMENT/PLAN:   Assessment & Plan Chronic obstructive pulmonary disease, unspecified COPD type (HCC) Known, oxygen saturation appropriate.  She has not been taking her Anoro Ellipta, I have refilled this.  She is to discuss tobacco use with clinical pharmacist Dr. Raymondo Band today.  She does not meet criteria for supplemental O2, I have discussed this with her. Chronic lower extremity pain (Tertiary Area of Pain) (Right) Continues to be managed for chronic pain of her right lower extremity and hallux rigidus of her right foot by orthopedic surgery.  Requiring cane for walking purposes while continuing workup.  Fortunately, she lives on first-floor set of stairs are not a problem.  DME for cane ordered. Medication management Monitoring labs for Lamictal and quetiapine, check TSH, CBC, CMP for liver and kidney function. Hyperlipidemia, unspecified hyperlipidemia type Adherent on atorvastatin 10 mg daily.  Recheck lipid panel. Bilateral impacted cerumen Debrox drops x 5 days.  Recheck as needed. Routine screening for STI (sexually transmitted infection) Screening for HIV, RPR, hepatitis B&C. Return if symptoms worsen or fail to improve. [name], DO 02/25/2024, 9:44 AM PGY-3, [facility]

## 2024-02-25 NOTE — Patient Instructions (Addendum)
 It was great to see you today! Thank you for choosing [facility] for your primary care.  Today we addressed: I have ordered a cane for your right lower foot pain. You need to be taking the Anoro Ellipta for your COPD.  I have reordered this. We are checking labs. You have a bilateral cerumen impaction likely due to using Q-tips and jamming earwax to the back of your ears.  Please use Debrox drops for 5 days.  If you haven't already, sign up for My Chart to have easy access to your labs results, and communication with your primary care physician. We are checking some labs today. If they are abnormal, I will call you. If they are normal, I will send you a MyChart message (if it is active) or a letter in the mail. If you do not hear about your labs in the next 2 weeks, please call the office. Return if symptoms worsen or fail to improve. Please arrive 15 minutes before your appointment to ensure smooth check in process.  We appreciate your efforts in making this happen.  Thank you for allowing me to participate in your care, [name], DO 02/25/2024, 9:40 AM PGY-3, [facility]

## 2024-02-25 NOTE — Progress Notes (Signed)
   S:   Chief Complaint  Patient presents with   Medication Management    Breathing - Tobacco Cessation   63 y.o. female who presents for evaluation/assistance with tobacco dependence.  PMH is significant for bipolar disorder, COPD, former cocaine abuse, chronic pain, former tobacco use.   Patient was referred and last seen by Primary Care Provider, Dr. Royal Piedra, on 11/25/23.   At last visit, patient reported urinary incontinence and was in progress for obtaining supplies through DME form.   Number of cigarettes/day <1 (patient reported 1 pack over the course of the last month).  Estimated nicotine intake per day <1 mg.    Most recent quit attempt 3 days (hasn't smoked since 3/16) Longest time ever been tobacco free 4-5 years.  Medications used in past cessation efforts include: NRT  Rates IMPORTANCE of quitting tobacco on 1-10 scale of 10. Rates CONFIDENCE of quitting tobacco on 1-10 scale of 10.  Most common triggers to use tobacco include; stress   Motivation to quit: health  O: Clinical ASCVD: No  The 10-year ASCVD risk score (Arnett DK, et al., 2019) is: 8.3%   Values used to calculate the score:     Age: 35 years     Sex: Female     Is Non-Hispanic African American: Yes     Diabetic: No     Tobacco smoker: No     Systolic Blood Pressure: 140 mmHg     Is BP treated: Yes     HDL Cholesterol: 79 mg/dL     Total Cholesterol: 183 mg/dL  Review of Systems  Respiratory:  Positive for shortness of breath.   All other systems reviewed and are negative.  Physical Exam Vitals reviewed.  Constitutional:      Appearance: Normal appearance.  Neurological:     Mental Status: She is alert.  Psychiatric:        Mood and Affect: Mood normal.        Behavior: Behavior normal.        Thought Content: Thought content normal.        Judgment: Judgment normal.   A/P: Tobacco use disorder with mild nicotine dependence of 7 years (2018) years duration in a patient who is  excellent candidate for success because of patient's confidence to quit for health purposes and previous tobacco free duration of 4-5 years after 2020.     Shortness of breath, long-standing, reported as recently worsened but found to have high (normal) oxygen saturation. Restarted on Anoro Ellipta today by PCP.  -Re-evaluate subjective improvement after use of Anoro for  several weeks.   Patient reported Miralax being ineffective for constipation -Recommended trial of Senna (over-the-counter)  Written patient instructions provided. Patient verbalized understanding of treatment plan.  Total time in face to face counseling 34 minutes.    Follow-up:  Pharmacist Dr. Raymondo Band 04/05/24 PCP clinic visit PRN Patient seen with Threasa Heads, PharmD Candidate and Mack Guise, PharmD Candidate.

## 2024-02-25 NOTE — Assessment & Plan Note (Signed)
 Adherent on atorvastatin 10 mg daily.  Recheck lipid panel.

## 2024-02-26 LAB — COMPREHENSIVE METABOLIC PANEL
ALT: 9 IU/L (ref 0–32)
AST: 16 IU/L (ref 0–40)
Albumin: 4.6 g/dL (ref 3.9–4.9)
Alkaline Phosphatase: 102 IU/L (ref 44–121)
BUN/Creatinine Ratio: 19 (ref 12–28)
BUN: 14 mg/dL (ref 8–27)
Bilirubin Total: 0.4 mg/dL (ref 0.0–1.2)
CO2: 22 mmol/L (ref 20–29)
Calcium: 10.1 mg/dL (ref 8.7–10.3)
Chloride: 103 mmol/L (ref 96–106)
Creatinine, Ser: 0.75 mg/dL (ref 0.57–1.00)
Globulin, Total: 2.8 g/dL (ref 1.5–4.5)
Glucose: 115 mg/dL — ABNORMAL HIGH (ref 70–99)
Potassium: 3.9 mmol/L (ref 3.5–5.2)
Sodium: 140 mmol/L (ref 134–144)
Total Protein: 7.4 g/dL (ref 6.0–8.5)
eGFR: 89 mL/min/{1.73_m2} (ref >59)

## 2024-02-26 LAB — CBC
Hematocrit: 40.7 % (ref 34.0–46.6)
Hemoglobin: 12.7 g/dL (ref 11.1–15.9)
MCH: 26.3 pg — ABNORMAL LOW (ref 26.6–33.0)
MCHC: 31.2 g/dL — ABNORMAL LOW (ref 31.5–35.7)
MCV: 84 fL (ref 79–97)
Platelets: 271 10*3/uL (ref 150–450)
RBC: 4.83 x10E6/uL (ref 3.77–5.28)
RDW: 14.3 % (ref 11.7–15.4)
WBC: 6.8 10*3/uL (ref 3.4–10.8)

## 2024-02-26 LAB — LIPID PANEL
Chol/HDL Ratio: 2.4 ratio (ref 0.0–4.4)
Cholesterol, Total: 193 mg/dL (ref 100–199)
HDL: 82 mg/dL (ref >39)
LDL Chol Calc (NIH): 102 mg/dL — ABNORMAL HIGH (ref 0–99)
Triglycerides: 48 mg/dL (ref 0–149)
VLDL Cholesterol Cal: 9 mg/dL (ref 5–40)

## 2024-02-26 LAB — HEPATITIS B SURFACE ANTIGEN: Hepatitis B Surface Ag: NEGATIVE

## 2024-02-26 LAB — TSH: TSH: 0.844 u[IU]/mL (ref 0.450–4.500)

## 2024-02-26 LAB — HCV AB W REFLEX TO QUANT PCR: HCV Ab: NONREACTIVE

## 2024-02-26 LAB — HIV ANTIBODY (ROUTINE TESTING W REFLEX): HIV Screen 4th Generation wRfx: NONREACTIVE

## 2024-02-26 LAB — RPR: RPR Ser Ql: NONREACTIVE

## 2024-02-26 LAB — HCV INTERPRETATION

## 2024-02-26 NOTE — Progress Notes (Signed)
 Reviewed and agree with Dr Macky Lower plan.

## 2024-03-01 ENCOUNTER — Encounter: Payer: Self-pay | Admitting: Student

## 2024-03-01 ENCOUNTER — Telehealth: Payer: Self-pay | Admitting: Student

## 2024-03-01 NOTE — Telephone Encounter (Signed)
 Patient called directly back.  Stated paperwork regarding care plan was not necessary.  She requested I send her letter with lab results.

## 2024-03-01 NOTE — Telephone Encounter (Signed)
 Called regarding lab results and paperwork that patient dropped off in office after clinic visit.  Left HIPAA compliant voice message.  Lab work concerning.  Advised her that I did not know the reason for her dropping off her care plan information to the office and please call back to notify my nursing staff what exactly she needed in regards to this paperwork.

## 2024-03-02 ENCOUNTER — Telehealth (HOSPITAL_COMMUNITY): Payer: MEDICAID | Admitting: Physician Assistant

## 2024-03-02 ENCOUNTER — Encounter (HOSPITAL_COMMUNITY): Payer: Self-pay

## 2024-03-04 ENCOUNTER — Telehealth: Payer: Self-pay

## 2024-03-04 NOTE — Telephone Encounter (Signed)
 Patient returns call to nurse line.   Advised of message from [employer].  Patient reports she will try again in 2026.

## 2024-03-04 NOTE — Telephone Encounter (Signed)
 Community message sent to [employer] for [employer].   Will await response.   Veronda Prude, RN

## 2024-03-04 NOTE — Telephone Encounter (Signed)
 Received the following message from [employer].   Pt insurance paid for a rollator in 2023 so they will not cover another ambulatory aid till 2026. We cannot offer her private pay since she has Medicaid.   Attempted to call patient back to provide update. She did not answer, LVM asking that she return call to office.   Veronda Prude, RN

## 2024-03-05 ENCOUNTER — Encounter: Payer: Self-pay | Admitting: Dietician

## 2024-03-05 ENCOUNTER — Encounter: Payer: MEDICAID | Attending: Family Medicine | Admitting: Dietician

## 2024-03-05 DIAGNOSIS — R7303 Prediabetes: Secondary | ICD-10-CM | POA: Diagnosis present
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
## 2024-03-05 NOTE — Patient Instructions (Addendum)
 Premier Protein shake OR Boost Glucose Control OR another protein shake with less than 20 grams carbohydrates per bottle.  Add more vegetables.  Breakfast ideas:  [place] Yogurt, berries or pineapple  Egg, whole wheat toast, fresh fruit  Plain oatmeal with cut apple and peanuts  Plain oatmeal with banana and peanut butter  Whole grain waffles (frozen), unsweetened applesauce, egg or peanut butter  Guilt free sausage (2) - see recipe, grits or hashbrown, egg, fresh fruit  Breakfast burrito (fat free refried beans, sauteed peppers and onions with eggs, salsa, low carb or Whole wheat tortilla)  Lunch ideas:  Salad with rinsed kidney beans, egg  Homemade soup and salad, fresh fruit  Homemade soup and 1/2 sandwich, fresh fruit  Tuna sandwich, raw cucumbers or other vege, fresh fruit  [place] sandwich on whole wheat bread, raw vegetables, fresh fruit  Dinner:    Oven fried chicken (Dip thin chicken breast in milk then seasoned crushed cornflakes and bake), sweet potato, greens  Pinto beans, onions, greens  Bean burrito (low carb or whole wheat tortilla with fat free refried beans, small amount of cheese, lettuce, tomato, salsa)  Homemade pizza - use the low carb tortilla as a base with some low sugar pizza or pasta sauce and toppings of your choice  Vegetable plate

## 2024-03-17 ENCOUNTER — Other Ambulatory Visit (HOSPITAL_COMMUNITY): Payer: Self-pay | Admitting: Physician Assistant

## 2024-03-17 DIAGNOSIS — F3112 Bipolar disorder, current episode manic without psychotic features, moderate: Secondary | ICD-10-CM

## 2024-03-24 ENCOUNTER — Telehealth (HOSPITAL_COMMUNITY): Payer: Self-pay | Admitting: Mental Health

## 2024-03-24 NOTE — Telephone Encounter (Signed)
 Therapist received missed call from pt inquiring about her upcoming appointment. Therapist returned call, no answer. Left message of upcoming appointment for medication management (4/17) and OPT (4/23).

## 2024-03-25 ENCOUNTER — Encounter (HOSPITAL_COMMUNITY): Payer: MEDICAID | Admitting: Physician Assistant

## 2024-03-31 ENCOUNTER — Ambulatory Visit (INDEPENDENT_AMBULATORY_CARE_PROVIDER_SITE_OTHER): Payer: MEDICAID | Admitting: Mental Health

## 2024-03-31 ENCOUNTER — Encounter (HOSPITAL_COMMUNITY): Payer: Self-pay

## 2024-03-31 DIAGNOSIS — F3112 Bipolar disorder, current episode manic without psychotic features, moderate: Secondary | ICD-10-CM | POA: Diagnosis not present

## 2024-03-31 NOTE — Progress Notes (Signed)
   THERAPIST PROGRESS NOTE Virtual Visit via Video Note  I connected with Rhonda Dominguez on 03/31/24 at  8:00 AM EDT by a video enabled telemedicine application and verified that I am speaking with the correct person using two identifiers.  Location: Patient: home address on file Provider: office   I discussed the limitations of evaluation and management by telemedicine and the availability of in person appointments. The patient expressed understanding and agreed to proceed.  I discussed the assessment and treatment plan with the patient. The patient was provided an opportunity to ask questions and all were answered. The patient agreed with the plan and demonstrated an understanding of the instructions.   The patient was advised to call back or seek an in-person evaluation if the symptoms worsen or if the condition fails to improve as anticipated.  I provided 25 minutes of non-face-to-face time during this encounter.   Rhonda Dominguez, [facility]   Session Time: 8:06 am (   Participation Level: Active  Behavioral Response: CasualAlertEuthymic  Type of Therapy: Individual Therapy  Treatment Goals addressed: STG: "I am doing good." Rhonda Dominguez will maintain stability in moods AEB daily medication management and use of distress tolerance and emotional regulation skills as needed for a period of 90 days.  ProgressTowards Goals: Progressing  Interventions: CBT and Supportive  Summary:  Rhonda Dominguez is a 63 y.o. female who presents with dx of bipolar disorder. Rhonda Dominguez presents for session alert and oriented; mood and affect elevated. Speech at fast pace, Thought content tangential. This appears to be baseline for her. Rhonda Dominguez shares to be doing well, " I been doing good." Rhonda Dominguez has been working on being polite to others and not engaging with conflict. Notes ongoing issue with lady at [facility] transportation office with cancelling her appointments without notice to her. Shares has  been trying to be nice with her. Shares has been medication compliant and exploring ability to present to the gym. Shares for upcoming plans for her birthday. Denies safety concerns and reports stability in moods.   Suicidal/Homicidal: Nowithout intent/plan  Therapist Response: Therapist engaged pt in therapy session. Completed check in and assessed for current level of functioning; sxs management and level of stressors. Provided safe space for pt to share thoughts and feelings of irritation with individual in medicaid transportation office. (Call transferred to telephonic due to connectivity issues) Explored ability to manage stressful events without resorting to verbal and physical aggressive comments. Assessed for medication compliance. Provided supportive feedback and explored factors that contribute to stability in mental health sxs. Reviewed session and provided follow up. Provided contact number for crisis services as requested ( no safety concerns) Plan: Return again in  x6 weeks.  Diagnosis: Bipolar affective disorder, currently manic, moderate (HCC)  Collaboration of Care: Other None  Patient/Guardian was advised Release of Information must be obtained prior to any record release in order to collaborate their care with an outside provider. Patient/Guardian was advised if they have not already done so to contact the registration department to sign all necessary forms in order for us  to release information regarding their care.   Consent: Patient/Guardian gives verbal consent for treatment and assignment of benefits for services provided during this visit. Patient/Guardian expressed understanding and agreed to proceed.   Rhonda Dominguez, [facility] 03/31/2024

## 2024-04-05 ENCOUNTER — Ambulatory Visit: Payer: MEDICAID | Admitting: Pharmacist

## 2024-04-05 ENCOUNTER — Ambulatory Visit
Admission: RE | Admit: 2024-04-05 | Discharge: 2024-04-05 | Disposition: A | Discharge status: Home / Self Care | Payer: MEDICAID | Source: Ambulatory Visit | Attending: Family Medicine | Admitting: Family Medicine

## 2024-04-05 ENCOUNTER — Encounter: Payer: Self-pay | Admitting: Student

## 2024-04-05 DIAGNOSIS — Z1231 Encounter for screening mammogram for malignant neoplasm of breast: Secondary | ICD-10-CM

## 2024-04-06 ENCOUNTER — Ambulatory Visit: Payer: MEDICAID | Admitting: Student

## 2024-04-06 VITALS — BP 138/76 | HR 74 | Wt 148.2 lb

## 2024-04-06 DIAGNOSIS — Z23 Encounter for immunization: Secondary | ICD-10-CM | POA: Diagnosis not present

## 2024-04-06 NOTE — Progress Notes (Signed)
  SUBJECTIVE:   CHIEF COMPLAINT / HPI:   Requests her ears to be checked.   Requests update on her measles vaccination status.  Patient presented, left approximately 20 minutes after appointment without being seen as I was in the room with another patient.  PERTINENT  PMH / PSH: HTN, COPD, restrictive lung disease, chronic pain syndrome, OA, bladder disorder   OBJECTIVE:  BP 138/76   Pulse 74   Wt 148 lb 3.2 oz (67.2 kg)   SpO2 100%   BMI 23.21 kg/m  Not applicable  ASSESSMENT/PLAN:   Assessment & Plan Vaccine for measles MMR and varicella given by CMA.  Rhonda Goon, DO 04/06/2024, 10:00 AM PGY-3, [facility]

## 2024-04-07 ENCOUNTER — Other Ambulatory Visit: Payer: Self-pay | Admitting: Family Medicine

## 2024-04-07 DIAGNOSIS — R928 Other abnormal and inconclusive findings on diagnostic imaging of breast: Secondary | ICD-10-CM

## 2024-04-09 ENCOUNTER — Other Ambulatory Visit: Payer: Self-pay | Admitting: Student

## 2024-04-09 DIAGNOSIS — R21 Rash and other nonspecific skin eruption: Secondary | ICD-10-CM

## 2024-04-22 ENCOUNTER — Ambulatory Visit: Payer: MEDICAID

## 2024-04-22 ENCOUNTER — Ambulatory Visit
Admission: RE | Admit: 2024-04-22 | Discharge: 2024-04-22 | Disposition: A | Discharge status: Home / Self Care | Payer: MEDICAID | Source: Ambulatory Visit | Attending: Family Medicine | Admitting: Family Medicine

## 2024-04-22 DIAGNOSIS — R928 Other abnormal and inconclusive findings on diagnostic imaging of breast: Secondary | ICD-10-CM

## 2024-04-29 ENCOUNTER — Ambulatory Visit (INDEPENDENT_AMBULATORY_CARE_PROVIDER_SITE_OTHER): Payer: MEDICAID | Admitting: Orthopedic Surgery

## 2024-04-29 ENCOUNTER — Encounter: Payer: Self-pay | Admitting: Orthopedic Surgery

## 2024-04-29 DIAGNOSIS — M2021 Hallux rigidus, right foot: Secondary | ICD-10-CM | POA: Diagnosis not present

## 2024-04-29 DIAGNOSIS — L859 Epidermal thickening, unspecified: Secondary | ICD-10-CM

## 2024-04-29 DIAGNOSIS — B351 Tinea unguium: Secondary | ICD-10-CM

## 2024-04-29 NOTE — Progress Notes (Signed)
 Office Visit Note   Patient: Rhonda Dominguez           Date of Birth: [birth year]           MRN: [id number] Visit Date: 04/29/2024              Requested by: Veronia Goon, DO [address] [place],  [place] [address] PCP: Veronia Goon, DO  Chief Complaint  Patient presents with   Right Foot - Pain      HPI: Patient is a 63 year old woman who presents for a painful hyperkeratotic lesion right heel as well as painful onychomycotic nails.  Assessment & Plan: Visit Diagnoses:  1. Hallux rigidus, right foot   2. Onychomycosis   3. Hyperkeratosis of hindfoot     Plan: Hyperkeratotic lesion was pared and the nails were trimmed.  Patient states her foot does feel better.  She will follow-up if she develops further issues.  Recommended that she could try Kerasol for the fungal nails.  Follow-Up Instructions: Return if symptoms worsen or fail to improve.   Ortho Exam  Patient is alert, oriented, no adenopathy, well-dressed, normal affect, normal respiratory effort. Examination patient has good alignment of the great toe MTP fusion.  There is no redness no cellulitis no ulcers no signs of infection.  She has a painful hyperkeratotic lesion over the hindfoot.  After informed consent a 10 blade knife was used to pare the lesion back to healthy viable tissue.  Patient also has thickened discolored onychomycotic nails that were trimmed x 5.  Imaging: No results found. No images are attached to the encounter.  Labs: Lab Results  Component Value Date   HGBA1C 5.8 (A) 11/25/2023   HGBA1C 5.7 06/19/2020   HGBA1C 6.0 03/14/2020   ESRSEDRATE 20 10/23/2017   CRP 12.8 (H) 10/23/2017     Lab Results  Component Value Date   ALBUMIN 4.6 02/25/2024   ALBUMIN 4.2 03/24/2023   ALBUMIN 4.6 10/23/2017    Lab Results  Component Value Date   MG 2.5 (H) 10/23/2017   MG 2.1 08/26/2007   No results found for: "VD25OH"  No results found for: "PREALBUMIN"    Latest Ref Rng & Units  02/25/2024   10:08 AM 08/07/2022    6:30 AM 12/12/2021    9:45 AM  CBC EXTENDED  WBC 3.4 - 10.8 x10E3/uL 6.8  7.3  6.6   RBC 3.77 - 5.28 x10E6/uL 4.83  4.59  4.73   Hemoglobin 11.1 - 15.9 g/dL 32.4  40.1  02.7   HCT 34.0 - 46.6 % 40.7  37.4  37.8   Platelets 150 - 450 x10E3/uL 271  225  246      There is no height or weight on file to calculate BMI.  Orders:  No orders of the defined types were placed in this encounter.  No orders of the defined types were placed in this encounter.    Procedures: No procedures performed  Clinical Data: No additional findings.  ROS:  All other systems negative, except as noted in the HPI. Review of Systems  Objective: Vital Signs: There were no vitals taken for this visit.  Specialty Comments:  No specialty comments available.  PMFS History: Patient Active Problem List   Diagnosis Date Noted   Prediabetes 11/25/2023   Medication management 04/15/2023   Hyperlipidemia 04/07/2023   Breast lump 03/24/2023   Encounter for annual physical exam 05/28/2022   Prurigo nodularis 12/27/2021   Nutritional counseling 07/03/2020  GERD (gastroesophageal reflux disease) 06/19/2020   Paronychia of right middle finger 05/19/2020   COPD (chronic obstructive pulmonary disease) (HCC) 12/08/2019   Loud snoring 07/01/2019   Neurotic excoriations 05/20/2019   Skin lesions, generalized 10/16/2018   Complex regional pain syndrome type 1 of lower extremity (Right) 11/10/2017   Cocaine abuse (HCC) 11/10/2017   History of tobacco abuse 11/10/2017   Osteoarthritis 11/10/2017   Chronic shoulder pain (Bilateral) (L>R) 11/10/2017   Dextroscoliosis 11/10/2017   DDD (degenerative disc disease), cervical 11/10/2017   Knee pain, chronic (Secondary Area of Pain) (right) 11/09/2017   Chronic lower extremity pain [facility] Area of Pain) (Right) 10/23/2017   Chronic pain syndrome 10/23/2017   Hallux rigidus, right foot 10/23/2017   Urinary incontinence 11/20/2016    Restrictive lung disease 07/23/2016   Essential hypertension 03/22/2016   High risk bisexual behavior 05/18/2015   CATARACTS, BILATERAL 06/24/2007   Bipolar affective disorder (HCC) 10/03/2006   Constipation 10/03/2006   Past Medical History:  Diagnosis Date   Bipolar disorder (HCC)    Depression    Drug abuse (HCC)    Hepatitis    Hypertension    Pneumothorax    Pre-diabetes     History reviewed. No pertinent family history.  Past Surgical History:  Procedure Laterality Date   ARTHRODESIS METATARSALPHALANGEAL JOINT (MTPJ) Right 08/07/2022   Procedure: FUSION RIGHT GREAT TOE METATARSOPHALANGEAL JOINT;  Surgeon: Timothy Ford, MD;  Location: [facility] OR;  Service: Orthopedics;  Laterality: Right;   CATARACT EXTRACTION Bilateral    Social History   Occupational History   Not on file  Tobacco Use   Smoking status: Former    Current packs/day: 0.00    Average packs/day: 1 pack/day for 40.2 years (40.2 ttl pk-yrs)    Types: Cigarettes    Start date: 12/09/1978    Quit date: 03/10/2019    Years since quitting: 5.1    Passive exposure: Past   Smokeless tobacco: Never   Tobacco comments:    Smoked 1-2 ppd for 40 years  QUIT 02/2019  Substance and Sexual Activity   Alcohol use: No    Comment: none in 8 years   Drug use: No    Comment: clean 8 years   Sexual activity: Not on file

## 2024-05-13 ENCOUNTER — Ambulatory Visit: Payer: MEDICAID | Admitting: Orthopedic Surgery

## 2024-05-17 ENCOUNTER — Other Ambulatory Visit: Payer: Self-pay | Admitting: Student

## 2024-05-17 DIAGNOSIS — E785 Hyperlipidemia, unspecified: Secondary | ICD-10-CM

## 2024-05-19 ENCOUNTER — Encounter (HOSPITAL_COMMUNITY): Payer: Self-pay

## 2024-05-19 ENCOUNTER — Telehealth (HOSPITAL_COMMUNITY): Payer: MEDICAID | Admitting: Physician Assistant

## 2024-05-20 ENCOUNTER — Ambulatory Visit: Payer: MEDICAID | Admitting: Dietician

## 2024-05-24 ENCOUNTER — Ambulatory Visit (INDEPENDENT_AMBULATORY_CARE_PROVIDER_SITE_OTHER): Payer: MEDICAID | Admitting: Mental Health

## 2024-05-24 DIAGNOSIS — F3110 Bipolar disorder, current episode manic without psychotic features, unspecified: Secondary | ICD-10-CM | POA: Diagnosis not present

## 2024-05-24 NOTE — Progress Notes (Signed)
   THERAPIST PROGRESS NOTE Virtual Visit via Video Note  I connected with Rhonda Dominguez on 05/24/24 at 10:00 AM EDT by a video enabled telemedicine application and verified that I am speaking with the correct person using two identifiers.  Location: Patient: home address on file Provider: home office   I discussed the limitations of evaluation and management by telemedicine and the availability of in person appointments. The patient expressed understanding and agreed to proceed.  I discussed the assessment and treatment plan with the patient. The patient was provided an opportunity to ask questions and all were answered. The patient agreed with the plan and demonstrated an understanding of the instructions.   The patient was advised to call back or seek an in-person evaluation if the symptoms worsen or if the condition fails to improve as anticipated.  I provided 17 minutes of non-face-to-face time during this encounter.   Loman Risk, [facility]   Session Time: 10:03 am ( 17 minutes)  Participation Level: Active  Behavioral Response: CasualAlertEuthymic  Type of Therapy: Individual Therapy  Treatment Goals addressed:  STG: I am doing good. Phoua will maintain stability in moods AEB daily medication management and use of distress tolerance and emotional regulation skills as needed for a period of 90 days.   ProgressTowards Goals: Progressing  Interventions: Supportive  Summary: Rhonda Dominguez is a 63 y.o. female who presents with dx of bipolar disorder. Tamana presents for session alert and oriented; mood and affect stable. Speech at fast pace, loud tone. Thought content linear. Denies excessive elevated or depressed mood and shares to be  I been doing real good. Notes she has not been in conflict with others and has maintain medication compliance. Reports to have had a birthday party earlier this month in which she enjoyed and gets out of the house to attend  church and present to lunch with a friend. Notes to have let go of others in her life that were a negative influence.  I don't holler. I don't fight with people. Shares upcoming plans to present to [facility] for peer support services. Denies concerns. Appears for moods to be stable; progress with goal. No safety concerns.   Suicidal/Homicidal: Nowithout intent/plan  Therapist Response: Therapist engaged pt in therapy session. Completed check in and assessed for current level of functioning; sxs management and level of stressors. Provided safe space for pt to share current level of functioning. Assessed for medication compliance and any events of discord with other and ability to manage distress. Reviewed use of distress tolerance skills. Provided contact information for [facility] house as requested. Reviewed session and provided follow up.    Plan: Return again in x 7 weeks.  Diagnosis: Bipolar affective disorder, current episode manic, current episode severity unspecified (HCC)  Collaboration of Care: Other None  Patient/Guardian was advised Release of Information must be obtained prior to any record release in order to collaborate their care with an outside provider. Patient/Guardian was advised if they have not already done so to contact the registration department to sign all necessary forms in order for us  to release information regarding their care.   Consent: Patient/Guardian gives verbal consent for treatment and assignment of benefits for services provided during this visit. Patient/Guardian expressed understanding and agreed to proceed.   Carmel Chimes Vaughn, [facility] 05/24/2024

## 2024-05-25 ENCOUNTER — Ambulatory Visit: Payer: MEDICAID | Admitting: Pharmacist

## 2024-06-10 ENCOUNTER — Ambulatory Visit (INDEPENDENT_AMBULATORY_CARE_PROVIDER_SITE_OTHER): Payer: MEDICAID | Admitting: Family Medicine

## 2024-06-10 ENCOUNTER — Encounter: Payer: Self-pay | Admitting: Family Medicine

## 2024-06-10 VITALS — BP 140/100 | HR 78 | Ht 67.0 in | Wt 145.1 lb

## 2024-06-10 DIAGNOSIS — L989 Disorder of the skin and subcutaneous tissue, unspecified: Secondary | ICD-10-CM | POA: Diagnosis not present

## 2024-06-10 DIAGNOSIS — I10 Essential (primary) hypertension: Secondary | ICD-10-CM

## 2024-06-10 DIAGNOSIS — Z122 Encounter for screening for malignant neoplasm of respiratory organs: Secondary | ICD-10-CM

## 2024-06-10 NOTE — Progress Notes (Signed)
    SUBJECTIVE:   CHIEF COMPLAINT / HPI:   Lesion on face - L side Pt reports 2 weeks ago she picked off a spot on her face and it resulted in this scab Somewhat painful and itchy Not growing in size No other lesions on body Not shaving or using razor on face Would really like to see a dermatologist  HTN On amlodipine  and hydrochlorothiazide  Took this AM Feels a little anxious today Doesn't have home BP cuff but willing to get  Lung cancer screening >20 pack year history, 1ppd x >84yrs Current smoker but has cut down  PERTINENT  PMH / PSH: HTN  OBJECTIVE:   BP (!) 143/97   Pulse 78   Ht 5' 7 (1.702 m)   Wt 145 lb 2 oz (65.8 kg)   SpO2 96%   BMI 22.73 kg/m   General: NAD, pleasant, able to participate in exam Cardiac: RRR, no murmurs auscultated Respiratory: CTAB, normal WOB Abdomen: soft, non-tender, non-distended, normoactive bowel sounds Extremities: warm and well perfused, no edema or cyanosis Skin: Healing lesion on L side of face see below    ASSESSMENT/PLAN:   Assessment & Plan Skin lesion Seems to be a result of picking off a reportedly pustular lesion Patient would really like to be seen by a skin doctor Can schedule in skin clinic if patient prefers but otherwise monitor over time, discussed normal wound care and healing Screening for lung cancer Repeat low-dose CT due, ordered Essential hypertension Pt feels BP slightly elevated due to white coat htn On amlodipine  and HCTZ, advised patient to pick up home BP cuff and keep log at home F/u next visit   Note that patient left prior to me being able to review AVS with her  Payton Coward, MD [facility]

## 2024-06-10 NOTE — Assessment & Plan Note (Signed)
 Pt feels BP slightly elevated due to white coat htn On amlodipine  and HCTZ, advised patient to pick up home BP cuff and keep log at home F/u next visit

## 2024-06-10 NOTE — Patient Instructions (Addendum)
 [employer] blood pressure cuff    Blood Pressure Record Sheet To take your blood pressure, you will need a blood pressure machine. You can buy a blood pressure machine (blood pressure monitor) at your clinic, drug store, or online. When choosing one, consider: An automatic monitor that has an arm cuff. A cuff that wraps snugly around your upper arm. You should be able to fit only one finger between your arm and the cuff. A device that stores blood pressure reading results. Do not choose a monitor that measures your blood pressure from your wrist or finger. Follow your health care provider's instructions for how to take your blood pressure. To use this form: Take your blood pressure medications every day These measurements should be taken when you have been at rest for at least 10-15 min Take at least 2 readings with each blood pressure check. This makes sure the results are correct. Wait 1-2 minutes between measurements. Write down the results in the spaces on this form. Keep in mind it should always be recorded systolic over diastolic. Both numbers are important.  Repeat this every day for 2-3 weeks, or as told by your health care provider.  Make a follow-up appointment with your health care provider to discuss the results.  Blood Pressure Log Date Medications taken? (Y/N) Blood Pressure Time of Day

## 2024-06-18 ENCOUNTER — Telehealth (HOSPITAL_COMMUNITY): Payer: MEDICAID | Admitting: Physician Assistant

## 2024-06-18 ENCOUNTER — Encounter (HOSPITAL_COMMUNITY): Payer: Self-pay

## 2024-06-24 ENCOUNTER — Other Ambulatory Visit: Payer: Self-pay | Admitting: Family Medicine

## 2024-06-24 DIAGNOSIS — Z1231 Encounter for screening mammogram for malignant neoplasm of breast: Secondary | ICD-10-CM

## 2024-06-25 ENCOUNTER — Other Ambulatory Visit: Payer: Self-pay

## 2024-06-25 DIAGNOSIS — R21 Rash and other nonspecific skin eruption: Secondary | ICD-10-CM

## 2024-06-25 MED ORDER — HYDROXYZINE HCL 25 MG PO TABS: 25.0000 mg | ORAL_TABLET | Freq: Three times a day (TID) | ORAL | 0 refills | Status: DC | PRN

## 2024-07-02 ENCOUNTER — Telehealth (HOSPITAL_COMMUNITY): Payer: MEDICAID | Admitting: Physician Assistant

## 2024-07-02 ENCOUNTER — Telehealth (HOSPITAL_COMMUNITY): Payer: Self-pay | Admitting: Physician Assistant

## 2024-07-02 NOTE — Telephone Encounter (Signed)
 Patient called and left a voice mail on late in the day 07/01/24 to cancel her appointment on 07/02/24. Upon calling the patient back on 07/25, the patient mentioned that she wanted a phone call from [place] today and would step out of wherever she was if she could. Patient did not reschedule appointment though was offered that option. Patient has an appointment with Paige on 07/13/24.  Thank you

## 2024-07-13 ENCOUNTER — Ambulatory Visit (HOSPITAL_COMMUNITY): Payer: MEDICAID | Admitting: Mental Health

## 2024-07-13 ENCOUNTER — Telehealth (HOSPITAL_COMMUNITY): Payer: Self-pay | Admitting: Mental Health

## 2024-07-13 ENCOUNTER — Encounter (HOSPITAL_COMMUNITY): Payer: Self-pay

## 2024-07-13 NOTE — Telephone Encounter (Signed)
 Therapist sent link x 2 for tele-therapy session. No response. Therapist contacted pt on telephone in which pt reported to have forgotten about appointment and would connect to link. Therapist waited for 20 minutes with still no connection. NS

## 2024-07-27 ENCOUNTER — Other Ambulatory Visit: Payer: Self-pay

## 2024-07-27 MED ORDER — VENTOLIN HFA 108 (90 BASE) MCG/ACT IN AERS: 2.0000 | INHALATION_SPRAY | Freq: Four times a day (QID) | RESPIRATORY_TRACT | 1 refills | Status: DC | PRN

## 2024-08-04 ENCOUNTER — Ambulatory Visit (INDEPENDENT_AMBULATORY_CARE_PROVIDER_SITE_OTHER): Payer: MEDICAID | Admitting: Family Medicine

## 2024-08-04 ENCOUNTER — Encounter: Payer: Self-pay | Admitting: Family Medicine

## 2024-08-04 ENCOUNTER — Other Ambulatory Visit: Payer: Self-pay | Admitting: Family Medicine

## 2024-08-04 VITALS — BP 146/91 | HR 77 | Ht 67.0 in | Wt 146.2 lb

## 2024-08-04 DIAGNOSIS — H6123 Impacted cerumen, bilateral: Secondary | ICD-10-CM | POA: Diagnosis not present

## 2024-08-04 DIAGNOSIS — H938X9 Other specified disorders of ear, unspecified ear: Secondary | ICD-10-CM | POA: Diagnosis not present

## 2024-08-04 DIAGNOSIS — L989 Disorder of the skin and subcutaneous tissue, unspecified: Secondary | ICD-10-CM

## 2024-08-04 DIAGNOSIS — R21 Rash and other nonspecific skin eruption: Secondary | ICD-10-CM

## 2024-08-04 DIAGNOSIS — Z23 Encounter for immunization: Secondary | ICD-10-CM

## 2024-08-04 NOTE — Patient Instructions (Addendum)
 You will get a call to schedule your CT scan and skin clinic appointments  Continue using Debrox solution in your ears  If it does not help then you can see the ENT doctor, I have placed a referral for this  Take your BP medications every day

## 2024-08-04 NOTE — Assessment & Plan Note (Signed)
?  prurigo nodularis Likely worsened by her skin picking Discussed this and sent message to have her scheduled in skin clinic

## 2024-08-04 NOTE — Progress Notes (Signed)
    SUBJECTIVE:   CHIEF COMPLAINT / HPI:   Ear fullness Bilateral Has tried debrox Also using qtips No ringing or hearing loss, sounds are just muffled  Skin concern Wants to see a dermatologist Has some excoriations which have been present for a long time Is worried that her face is not as clear as it used to be She picks at her skin a lot which causes unroofing  PERTINENT  PMH / PSH: HTN, COPD  OBJECTIVE:   BP (!) 146/91   Pulse 77   Ht 5' 7 (1.702 m)   Wt 146 lb 4 oz (66.3 kg)   SpO2 98%   BMI 22.91 kg/m   General: NAD, pleasant, able to participate in exam Respiratory: No respiratory distress Skin: nodular rash with excoriations on sun exposed arms and posterior to ear, see images Psych: hyperactive but redirectable       ASSESSMENT/PLAN:   Assessment & Plan Bilateral impacted cerumen Sensation of fullness in ear, unspecified laterality Attempted ear irrigation with minimal improvement Advised to continue using debrox Placed ENT referral, advised ok to cancel this appt if the debrox works Skin lesions, generalized ?prurigo nodularis Likely worsened by her skin picking Discussed this and sent message to have her scheduled in skin clinic  BP slightly elevated today but pt did not take meds this morning, discussed Due for Low dose CT lung cancer screening, previously ordered, staff to schedule  Payton Coward, MD [facility]

## 2024-08-11 ENCOUNTER — Ambulatory Visit: Payer: MEDICAID | Admitting: Pharmacist

## 2024-08-13 ENCOUNTER — Other Ambulatory Visit (HOSPITAL_COMMUNITY): Payer: Self-pay | Admitting: Physician Assistant

## 2024-08-13 DIAGNOSIS — F3112 Bipolar disorder, current episode manic without psychotic features, moderate: Secondary | ICD-10-CM

## 2024-08-16 ENCOUNTER — Ambulatory Visit (INDEPENDENT_AMBULATORY_CARE_PROVIDER_SITE_OTHER): Payer: MEDICAID | Admitting: Pharmacist

## 2024-08-16 ENCOUNTER — Encounter: Payer: Self-pay | Admitting: Pharmacist

## 2024-08-16 VITALS — BP 157/87 | HR 68 | Wt 147.0 lb

## 2024-08-16 DIAGNOSIS — Z79899 Other long term (current) drug therapy: Secondary | ICD-10-CM

## 2024-08-16 MED ORDER — GABAPENTIN 300 MG PO CAPS: 300.0000 mg | ORAL_CAPSULE | Freq: Two times a day (BID) | ORAL | 3 refills | Status: AC

## 2024-08-16 MED ORDER — CETIRIZINE HCL 10 MG PO TABS: 10.0000 mg | ORAL_TABLET | Freq: Every day | ORAL | 0 refills | Status: DC

## 2024-08-16 NOTE — Progress Notes (Signed)
    S:     Chief Complaint  Patient presents with   Medication Management    Medication Review and Compliance   63 y.o. who presents for medication management due to concern of polypharmacy.  Patient arrives in good spirits and presents without assistance.    Patient was referred and last seen by Primary Care Provider, Dr. Romelle, on 08/04/2024.  At last visit patient reports getting two vaccinations.  She states she has had enough shots and is not interested in any vaccinations today.  She also reports eye swelling following last visit which resolved after sleeping overnight.  Unsure if this was related to the vaccinations.   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 yes Do you feel that your medications are working for you? yes Have you been experiencing any side effects to the medications prescribed? yes Do you ever have trouble remembering to take your medicine? yes Do you ever not take a medicine because you feel you do not need it? yes Do you have any problems obtaining medications due to finances? no    O:  Review of Systems  Musculoskeletal:  Positive for joint pain.  All other systems reviewed and are negative.   Physical Exam Vitals reviewed.  Constitutional:      Appearance: Normal appearance.  Pulmonary:     Effort: Pulmonary effort is normal.  Neurological:     Mental Status: She is alert.      Vitals:   08/16/24 0845 08/16/24 0846  BP: (!) 150/98 (!) 157/87  Pulse: 68   SpO2: 97%      A/P: Polypharmacy - Medication Reconciliation - Medication list reviewed and updated.   Understanding of regimen: good  Understanding of indications: fair  Potential of compliance: good  Patient has known adherence challenges.    Refilled TWO maintenance medications Cetirizine  and Gabapentin  which  requested and has used in the past.  I did not refill quetiapine  due to pending approval from mental health prescriber via EPIC.   Hypertension, elevated in office today.  Patient reports home health aid checks Blood Pressure daily and her systolic readings have been in the 130s.   She also reports not taking her Blood Pressure medications this AM.  Reports adherence on other days of the week.   Written patient instructions and updated medication list provided.  Total time in face to face counseling 29 minutes.    Follow up pharmacist visit TBD PCP Clinic Visit 1 month for consideration of fall vaccinations (flu and possibly covid) Patient seen with Fonda Blase, PharmD Candidate - PY3 student and Estefana Blase, PharmD Candidate - PY4 student.

## 2024-08-16 NOTE — Assessment & Plan Note (Signed)
 Polypharmacy - Medication Reconciliation - Medication list reviewed and updated.   Understanding of regimen: good  Understanding of indications: fair  Potential of compliance: good  Patient has known adherence challenges.    Refilled TWO maintenance medications Cetirizine  and Gabapentin  which requested and has used in the past.  I did not refill quetiapine  due to pending approval from mental health prescriber via EPIC.

## 2024-08-16 NOTE — Progress Notes (Signed)
 Reviewed and agree with Dr Rennis plan.

## 2024-08-16 NOTE — Patient Instructions (Signed)
 It was nice to see you today!  Keep up the great work!!!  Medication Changes: Continue all other medication the same.

## 2024-08-17 ENCOUNTER — Institutional Professional Consult (permissible substitution) (INDEPENDENT_AMBULATORY_CARE_PROVIDER_SITE_OTHER): Payer: MEDICAID | Admitting: Physician Assistant

## 2024-08-19 ENCOUNTER — Ambulatory Visit (INDEPENDENT_AMBULATORY_CARE_PROVIDER_SITE_OTHER): Payer: MEDICAID | Admitting: Physician Assistant

## 2024-08-19 ENCOUNTER — Encounter (INDEPENDENT_AMBULATORY_CARE_PROVIDER_SITE_OTHER): Payer: Self-pay | Admitting: Physician Assistant

## 2024-08-19 VITALS — BP 128/75 | HR 76 | Ht 67.0 in | Wt 150.0 lb

## 2024-08-19 DIAGNOSIS — H6123 Impacted cerumen, bilateral: Secondary | ICD-10-CM | POA: Diagnosis not present

## 2024-08-19 DIAGNOSIS — J309 Allergic rhinitis, unspecified: Secondary | ICD-10-CM | POA: Diagnosis not present

## 2024-08-19 MED ORDER — LORATADINE 10 MG PO TABS: 10.0000 mg | ORAL_TABLET | Freq: Every day | ORAL | 11 refills | Status: AC

## 2024-08-19 MED ORDER — FLUTICASONE PROPIONATE 50 MCG/ACT NA SUSP: 2.0000 | Freq: Every day | NASAL | 6 refills | Status: AC

## 2024-08-19 NOTE — Progress Notes (Signed)
 Dear Dr. Madelon, Here is my assessment for our mutual patient, Rhonda Dominguez. Thank you for allowing me the opportunity to care for your patient. Please do not hesitate to contact me should you have any other questions. Sincerely, Chyrl Cohen PA-C  Otolaryngology Clinic Note Referring provider: Dr. Madelon HPI:  Rhonda Dominguez is a 63 y.o. female kindly referred by Dr. Madelon   The patient is a 63 year old female seen in our office for evaluation of cerumen impaction.  The patient notes that she has had some minor decreased hearing over the years, recently she has had worsening decreased hearing bilaterally.  She went to her primary care provider who noted cerumen and muffled hearing.  She has been using Debrox with no significant improvement in her symptoms.  No associated pain no infectious signs or symptoms.  The patient notes that she also gets occasional dry scratchy throat with some postnasal drainage, particularly around the fall.  Has not tried any medications for this.   Independent Review of Additional Tests or Records:  Primary care office visit note on 08/04/2024   PMH/Meds/All/SocHx/FamHx/ROS:   Past Medical History:  Diagnosis Date   Bipolar disorder (HCC)    Depression    Drug abuse (HCC)    Hepatitis    Hypertension    Pneumothorax    Pre-diabetes      Past Surgical History:  Procedure Laterality Date   ARTHRODESIS METATARSALPHALANGEAL JOINT (MTPJ) Right 08/07/2022   Procedure: FUSION RIGHT GREAT TOE METATARSOPHALANGEAL JOINT;  Surgeon: Harden Jerona GAILS, MD;  Location: [facility] OR;  Service: Orthopedics;  Laterality: Right;   CATARACT EXTRACTION Bilateral     History reviewed. No pertinent family history.   Social Connections: Socially Isolated (03/04/2023)   Social Connection and Isolation Panel    Frequency of Communication with Friends and Family: More than three times a week    Frequency of Social Gatherings with Friends and Family: Never    Attends  Religious Services: Never    Database administrator or Organizations: No    Attends Banker Meetings: Never    Marital Status: Never married      Current Outpatient Medications:    fluticasone  (FLONASE ) 50 MCG/ACT nasal spray, Place 2 sprays into both nostrils daily., Disp: 16 g, Rfl: 6   loratadine  (CLARITIN ) 10 MG tablet, Take 1 tablet (10 mg total) by mouth daily., Disp: 30 tablet, Rfl: 11   amLODipine  (NORVASC ) 10 MG tablet, TAKE 1 TABLET(10 MG) BY MOUTH DAILY FOR BLOOD PRESSURE, Disp: 90 tablet, Rfl: 2   atorvastatin  (LIPITOR) 10 MG tablet, TAKE 1 TABLET(10 MG) BY MOUTH DAILY, Disp: 90 tablet, Rfl: 3   carbamide peroxide (DEBROX) 6.5 % OTIC solution, Place 5 drops into both ears 2 (two) times daily. Do not use more than 5 days. (Patient not taking: Reported on 08/16/2024), Disp: 15 mL, Rfl: 0   cetirizine  (ZYRTEC ) 10 MG tablet, Take 1 tablet (10 mg total) by mouth daily., Disp: 90 tablet, Rfl: 0   fluticasone  (FLONASE ) 50 MCG/ACT nasal spray, Place 1 spray into both nostrils daily. 1 spray in each nostril every day, Disp: 16 g, Rfl: 12   gabapentin  (NEURONTIN ) 300 MG capsule, Take 1 capsule (300 mg total) by mouth 2 (two) times daily., Disp: 180 capsule, Rfl: 3   hydrochlorothiazide  (HYDRODIURIL ) 25 MG tablet, TAKE 1 TABLET(25 MG) BY MOUTH DAILY FOR BLOOD PRESSURE, Disp: 90 tablet, Rfl: 1   hydrocortisone  2.5 % cream, APPLY TOPICALLY TWICE DAILY, Disp: 453.6 g, Rfl: 1  hydrOXYzine  (ATARAX ) 25 MG tablet, TAKE 1 TABLET(25 MG) BY MOUTH EVERY 8 HOURS AS NEEDED (Patient not taking: Reported on 08/16/2024), Disp: 90 tablet, Rfl: 0   lamoTRIgine  (LAMICTAL ) 25 MG tablet, Take 1 tablet (25 mg total) by mouth daily., Disp: 30 tablet, Rfl: 1   QUEtiapine  (SEROQUEL ) 50 MG tablet, TAKE 1 TABLET(50 MG) BY MOUTH AT BEDTIME, Disp: 30 tablet, Rfl: 1   triamcinolone  cream (KENALOG ) 0.1 %, Apply 1 Application topically 2 (two) times daily., Disp: 45 g, Rfl: 2   umeclidinium-vilanterol (ANORO  ELLIPTA) 62.5-25 MCG/ACT AEPB, One inhalation daily (Patient not taking: Reported on 08/16/2024), Disp: 1 each, Rfl: 11   VENTOLIN  HFA 108 (90 Base) MCG/ACT inhaler, Inhale 2 puffs into the lungs every 6 (six) hours as needed for wheezing or shortness of breath., Disp: 18 g, Rfl: 1   Physical Exam:   BP 128/75   Pulse 76   Ht 5' 7 (1.702 m)   Wt 150 lb (68 kg)   SpO2 95%   BMI 23.49 kg/m   Pertinent Findings  CN II-XII intact Bilateral external auditory canals with cerumen impaction Weber 512: equal Rinne 512: AC > BC b/l  Anterior rhinoscopy: Septum midline; bilateral inferior turbinates with minor hypertrophy No lesions of oral cavity/oropharynx; dentition in normal limits No obvious neck masses/lymphadenopathy/thyromegaly No respiratory distress or stridor  Seprately Identifiable Procedures:  Procedure: Bilateral ear microscopy and cerumen removal using microscope (CPT [phone]) - Mod 50 Pre-procedure diagnosis: bilateral cerumen impaction external auditory canals Post-procedure diagnosis: same Indication: bilateral cerumen impaction; given patient's otologic complaints and history as well as for improved and comprehensive examination of external ear and tympanic membrane, bilateral otologic examination using microscope was performed and impacted cerumen removed  Procedure: Patient was placed semi-recumbent. Both ear canals were examined using the microscope with findings above. Cerumen removed from bilateral external auditory canals using suction and currette with improvement in EAC examination and patency. Left: EAC was patent. TM was intact . Middle ear was aerated. Drainage: none Right: EAC was patent. TM was intact . Middle ear was aerated . Drainage: none Patient tolerated the procedure well.   Impression & Plans:  Rhonda Dominguez is a 63 y.o. female with the following   Cerumen impaction-  The patient presented today with cerumen impaction.  This was removed without  difficulty.  I see no signs of infection.  The patient will reach out to the office if she develops any new or worsening signs or symptoms.  She does not feel she has any significant change to her baseline hearing and did not elect for audiology evaluation today.   Allergic rhinitis-  Patient will attempt using daily Claritin  and Flonase  as needed for allergic rhinitis, happy to see her back in the office for any further management.   - f/u one year for repeat evaluation    Thank you for allowing me the opportunity to care for your patient. Please do not hesitate to contact me should you have any other questions.  Sincerely, Chyrl Cohen PA-C [facility] Phone: [phone] Fax: [phone]  08/19/2024, 3:01 PM

## 2024-09-07 ENCOUNTER — Telehealth: Payer: Self-pay | Admitting: Family Medicine

## 2024-09-07 NOTE — Telephone Encounter (Signed)
 Patient called asking if her doctor could please send in a prescription for her to get a walker along with clinic notes supporting her need for one. She doesn't remember the name of the company, states it's the same one she gets her pampers from. Asks if someone could please call her to let her know when it is sent in.

## 2024-09-09 ENCOUNTER — Ambulatory Visit: Payer: MEDICAID

## 2024-09-10 ENCOUNTER — Telehealth: Payer: Self-pay | Admitting: Family Medicine

## 2024-09-10 DIAGNOSIS — M2021 Hallux rigidus, right foot: Secondary | ICD-10-CM

## 2024-09-10 DIAGNOSIS — G8929 Other chronic pain: Secondary | ICD-10-CM

## 2024-09-10 NOTE — Telephone Encounter (Signed)
 Called patient regarding her call for a walker. She says she wants an order to be made for a cane. I asked if her DME company has sent any forms to us . She says no and asks for an order to be placed and says she has gone through this process before.   She also asks to reschedule her [facility] derm appointment since she missed her last appointment while she was sick.   DME order placed.

## 2024-09-14 NOTE — Telephone Encounter (Signed)
 Patient returns call to nurse line regarding DME order for cane.   Previous order was placed for a walker.   I also explained to patient that she needs appointment for documentation of medical need for cane.   Patient became upset and was yelling about why she needed to come in for appointment and that we were lying about faxing orders.   Explained to her that insurance companies have specific guidelines for coverage and that if we send order without documentation, this would be at cash price and not covered by insurance.   Patient agreeable to coming in next week with Dr. Romelle for this and to receive flu shot.   Chiquita JAYSON English, RN

## 2024-09-15 ENCOUNTER — Telehealth (HOSPITAL_COMMUNITY): Payer: Self-pay

## 2024-09-15 NOTE — Telephone Encounter (Signed)
 PT called this morning and when answering the Phone PT began screaming, I asked the PT if I could place her on a brief hold, PT began screaming and was placed on hold. PT hung up after being pup on hold and called the front desk back to back. Kim answered and you could hear the PT screaming at her through the phone - The PT wanted to speak with a supervisor, Luke tried explaining to the PT that the supervisor was out of the office at the moment, PT became more angry and continued to scream at Luke Luke tried helping the PT and informed the PT that a message would be sent to the supervisor and Supervisor would reach out to the PT today after lunch - PT continued to scream out Patient Access Staff and proceeded to hang up. Supervisor was made aware of incident and will call PT back

## 2024-09-17 ENCOUNTER — Ambulatory Visit (INDEPENDENT_AMBULATORY_CARE_PROVIDER_SITE_OTHER): Payer: MEDICAID | Admitting: Mental Health

## 2024-09-17 DIAGNOSIS — F3112 Bipolar disorder, current episode manic without psychotic features, moderate: Secondary | ICD-10-CM

## 2024-09-17 NOTE — Progress Notes (Signed)
   THERAPIST PROGRESS NOTE Virtual Visit via Video Note  I connected with Rhonda Dominguez on 09/17/24 at  8:00 AM EDT by a video enabled telemedicine application and verified that I am speaking with the correct person using two identifiers.  Location: Patient: home address on file Provider: remote office   I discussed the limitations of evaluation and management by telemedicine and the availability of in person appointments. The patient expressed understanding and agreed to proceed.  I discussed the assessment and treatment plan with the patient. The patient was provided an opportunity to ask questions and all were answered. The patient agreed with the plan and demonstrated an understanding of the instructions.   The patient was advised to call back or seek an in-person evaluation if the symptoms worsen or if the condition fails to improve as anticipated.  I provided 20 minutes of non-face-to-face time during this encounter.   Ty Bernice Savant, [facility]   Session Time: 1:92j   Participation Level: Active  Behavioral Response: CasualAlertEuthymic  Type of Therapy: Individual Therapy  Treatment Goals addressed:  STG: I am doing good. Ziyana will maintain stability in moods AEB daily medication management and use of distress tolerance and emotional regulation skills as needed for a period of 90 days.   ProgressTowards Goals: Progressing  Interventions: Supportive  Summary: Rhonda Dominguez is a 63 y.o. female who presents with dx of bipolar disorder. Brekyn presents for session alert and oriented; mood and affect elevated. Speech at fast pace, loud tone. Thought content linear. Denies excessive elevated or depressed moods, although presentation appears slightly elevated. Walking around home at time of session. Shares to be doing weil and to not be getting in disagreements with anyone. Johnny says I been doing good. Shares difficulties she feel she experienced with the front  desk. Notes plans to engage in upcoming homecoming activities and shares with therapist outfits she plans to wear and places she plans to attend. Notes to be taking her medications. Notes to have received a new case worker. Shares to have upcoming plans and in need of ending call. No safety concern reported.   Suicidal/Homicidal: Nowithout intent/plan  Therapist Response:  Therapist engaged pt in therapy session. Completed check in and assessed for current level of functioning; sxs management and level of stressors. Provided safe space for Lashawnda to share thoughts and concerns with front desk. Educated on dynamics of front desk. Assessed for medication management and ability  to manage stressors. Affirmed ability to engae with others and community. Encouraged to be mindful of moods and reactions to others. Reviewed session and provided follow up  Plan: Return again in  x 6 weeks.  Diagnosis: Bipolar affective disorder, currently manic, moderate (HCC)  Collaboration of Care: Other None  Patient/Guardian was advised Release of Information must be obtained prior to any record release in order to collaborate their care with an outside provider. Patient/Guardian was advised if they have not already done so to contact the registration department to sign all necessary forms in order for us  to release information regarding their care.   Consent: Patient/Guardian gives verbal consent for treatment and assignment of benefits for services provided during this visit. Patient/Guardian expressed understanding and agreed to proceed.   Ty Bernice Clyde, [facility] 09/17/2024

## 2024-09-21 ENCOUNTER — Ambulatory Visit: Payer: MEDICAID | Admitting: Family Medicine

## 2024-09-21 VITALS — BP 139/90 | HR 91 | Wt 146.2 lb

## 2024-09-21 DIAGNOSIS — L989 Disorder of the skin and subcutaneous tissue, unspecified: Secondary | ICD-10-CM

## 2024-09-21 DIAGNOSIS — M79604 Pain in right leg: Secondary | ICD-10-CM

## 2024-09-21 DIAGNOSIS — Z23 Encounter for immunization: Secondary | ICD-10-CM | POA: Diagnosis not present

## 2024-09-21 DIAGNOSIS — G8929 Other chronic pain: Secondary | ICD-10-CM

## 2024-09-21 DIAGNOSIS — J984 Other disorders of lung: Secondary | ICD-10-CM

## 2024-09-21 DIAGNOSIS — R32 Unspecified urinary incontinence: Secondary | ICD-10-CM

## 2024-09-21 DIAGNOSIS — J449 Chronic obstructive pulmonary disease, unspecified: Secondary | ICD-10-CM

## 2024-09-21 DIAGNOSIS — R21 Rash and other nonspecific skin eruption: Secondary | ICD-10-CM

## 2024-09-21 DIAGNOSIS — G894 Chronic pain syndrome: Secondary | ICD-10-CM

## 2024-09-21 MED ORDER — HYDROXYZINE HCL 25 MG PO TABS: 25.0000 mg | ORAL_TABLET | Freq: Three times a day (TID) | ORAL | 0 refills | Status: DC | PRN

## 2024-09-21 MED ORDER — VENTOLIN HFA 108 (90 BASE) MCG/ACT IN AERS: 2.0000 | INHALATION_SPRAY | Freq: Four times a day (QID) | RESPIRATORY_TRACT | 1 refills | Status: DC | PRN

## 2024-09-21 NOTE — Assessment & Plan Note (Signed)
 The above interfere with her mobility and cause falls Ordered DME for cane, routed to [employer] staff

## 2024-09-21 NOTE — Progress Notes (Signed)
    SUBJECTIVE:   CHIEF COMPLAINT / HPI:   Here to discuss need for cane - has required cane in the past for walking due to chronic lower extremity pain - lost her cane a few years ago but has recently had a couple of falls, most recently a few weeks ago. Did not hit her head, just her legs but her chronic pain interferes with her mobility. - would like DME for new cane  Patient also has a longstanding history of urinary incontinence for which she benefits from having DME supplies such as pull ups   Note - prior to her visit while checking in to the desk, I was informed by our front desk staff that patient was being extremely disrespectful and cursing at the staff and not cooperating with instructions. The staff member was understandably bothered and distressed by it. I discussed with the patient privately that this is unacceptable behavior but she maintained that she did not do anything wrong. I discussed with Dr. Anders our medical director who will work with our clinic manager to address this.   PERTINENT  PMH / PSH: htn, copd  OBJECTIVE:   BP (!) 139/90   Wt 146 lb 3.2 oz (66.3 kg)   BMI 22.90 kg/m    General: NAD Respiratory: No respiratory distress Psych: hyperactive and tangential but redirectable   ASSESSMENT/PLAN:    Assessment & Plan Chronic lower extremity pain (Tertiary Area of Pain) (Right) Chronic pain syndrome The above interfere with her mobility and cause falls Ordered DME for cane, routed to RN staff Urinary incontinence, unspecified type Patient would benefit from DME supplies for her incontinence Placed order and signed CMN form  Sent refill of inhaler for COPD Refilled atarax  for itching/rash - derm clinic appt scheduled 10/30 Covid shot today Pt left prior to BP recheck  Payton Coward, MD [facility]

## 2024-09-21 NOTE — Patient Instructions (Signed)
 I have ordered your dme

## 2024-09-22 ENCOUNTER — Telehealth: Payer: Self-pay | Admitting: *Deleted

## 2024-09-22 ENCOUNTER — Telehealth: Payer: Self-pay

## 2024-09-22 NOTE — Telephone Encounter (Signed)
 Pt states that [employer] needs something from us  to to continue getting her pulls ups, but was not sure what.  Contacted [employer] [facility], they need recent office notes (indicating use of pull-ups), a CMN form and a new script.  Have placed CMN form in Dr. Zenia box for completion.  Once completed will print OVN and script and fax to [phone].   Harlene Carte, CMA 07/04/1961

## 2024-09-22 NOTE — Telephone Encounter (Signed)
Community message sent to [employer] for cane.   Will await response.   Talbot Grumbling, RN

## 2024-09-22 NOTE — Telephone Encounter (Signed)
 Called patient to discuss her interaction with staff yesterday.  Examples provided to Dr. Anders and I were:  When asked Do you have an appt today she responded with  Well Im standing here aint I Abrasive when asked to sign AOB and Non-discrimitory disclosure and asked Why are you asking me all these questions Told the CMA Hold that door for me, that is what you get paid for.   Pt does not dispute any of it and confirms that she did say those things but meant nothing by it. She will reports that she always acts this way.  Explained why she now needed to sign the non-discriminatory act at every visit and discussed that we treat her with respect and ask the same from her. Also advised that continued actions like this could lead to dismissal.  Pt expresses understanding and says that she will act right from now on.  Harlene Carte, CMA

## 2024-09-23 NOTE — Addendum Note (Signed)
 Addended byBETHA COWARD, Anne-Marie Genson on: 09/23/2024 10:34 AM   Modules accepted: Orders

## 2024-09-23 NOTE — Assessment & Plan Note (Signed)
 Patient would benefit from DME supplies for her incontinence Placed order and signed CMN form

## 2024-09-24 NOTE — Telephone Encounter (Signed)
 Printed order and placed in medical records box to attach last OV note and fax to provided number.   Chiquita JAYSON English, RN

## 2024-09-24 NOTE — Telephone Encounter (Signed)
 Received message from [employer] that insurance would not cover cane due to patient receiving rollator in 2023.  Patient will not be eligible for another mobility device until next year.   Called patient and advised of update. Patient voices understanding.   Advised patient that we are in the process of sending over order for incontinence supplies.   Paperwork has been placed in medical records to attach last OV note and fax.   Chiquita JAYSON English, RN

## 2024-09-30 ENCOUNTER — Telehealth (HOSPITAL_COMMUNITY): Payer: Self-pay

## 2024-10-04 ENCOUNTER — Telehealth (HOSPITAL_COMMUNITY): Payer: Self-pay | Admitting: Mental Health

## 2024-10-04 ENCOUNTER — Telehealth (HOSPITAL_COMMUNITY): Payer: Self-pay

## 2024-10-04 NOTE — Telephone Encounter (Signed)
 Ms. Mabe just blessed me out and accused me of lying and she is asking for a return call from [place] within 30 minutes. When she asked both Brittany and me for Anjenette, we both explained that Anjenette was not in the office. She asked if another supervisor was available, but refused to be put on hold. She repeatedly said I was lying and raised her voice. She then asked for [facility]. When I told her that Bernice was busy seeing patients, she once again accused me of lying and said that if she wanted to talk to [place] because Bernice will always call her within 30 minutes. I told the patient I could leave Bernice a message. She said that if she does not get one within 30 minutes, she wants me and Brittany fired for lying. Please advise. Thank you.

## 2024-10-04 NOTE — Telephone Encounter (Signed)
 10/04/24: 4:41pm- Therapist returned call to pt. Pt answered and reported was on other line and requested therapist call her right back. Therapist educated would contact pt again the following day 10/05/24 at her earliest convenience . Pt agreed.

## 2024-10-05 ENCOUNTER — Telehealth (HOSPITAL_COMMUNITY): Payer: Self-pay | Admitting: Mental Health

## 2024-10-05 NOTE — Telephone Encounter (Signed)
 10/05/2024-8:25am ( 5 minutes) Therapist followed up with additional call to pt from previous day. Pt reported difficulty with front desk staff but unable to report specific concerns just noting she is not nice to me. I almost came up there. States to have became upset yesterday and reached out to her pastor and [employer]. Inquires of next scheduled appointment with therapist.   Therapist educated on follow up therapy session. Encouraged remaining cordial with patient access staff and ability to request to speak to additional patient access staff if available.

## 2024-10-07 ENCOUNTER — Ambulatory Visit: Payer: MEDICAID

## 2024-10-21 ENCOUNTER — Other Ambulatory Visit (INDEPENDENT_AMBULATORY_CARE_PROVIDER_SITE_OTHER): Payer: MEDICAID

## 2024-10-21 ENCOUNTER — Encounter: Payer: Self-pay | Admitting: Orthopedic Surgery

## 2024-10-21 ENCOUNTER — Ambulatory Visit: Payer: MEDICAID | Admitting: Orthopedic Surgery

## 2024-10-21 DIAGNOSIS — M79671 Pain in right foot: Secondary | ICD-10-CM

## 2024-10-21 MED ORDER — HYDROCODONE-ACETAMINOPHEN 5-325 MG PO TABS: 1.0000 | ORAL_TABLET | Freq: Two times a day (BID) | ORAL | 0 refills | Status: DC | PRN

## 2024-10-21 NOTE — Progress Notes (Signed)
 Office Visit Note   Patient: Rhonda Dominguez           Date of Birth: 1961-01-01           MRN: [id number] Visit Date: 10/21/2024              Requested by: Rhonda Booty, MD [address] [place],  [place] [address] PCP: Rhonda Booty, MD  Chief Complaint  Patient presents with   Right Leg - Pain      HPI: Discussed the use of AI scribe software for clinical note transcription with the patient, who gave verbal consent to proceed.  History of Present Illness Rhonda Dominguez is a 63 year old female who presents with right leg pain and a stinging sensation.  She experiences pain primarily around her big toe and a stinging sensation in her right leg. She recalls a fall in the tub and an incident where a television stand poked her in the right thigh, resulting in some firmness in the area.  She has a history of foot surgery with a plate and screws in her foot. She is concerned about the possibility of the plate being loose and the position of her toe, as well as the persistent stinging sensation.  She is currently taking gabapentin  but finds that Ultram  (tramadol ) is not effective for her pain. Hydrocodone  provides some relief. She wears special tennis shoes to accommodate her foot condition and uses them regularly when going out.  No calf pain or pain with dorsiflexion of the ankle. No swelling, ulcers, or signs of infection are noted.     Assessment & Plan: Visit Diagnoses:  1. Pain in right foot     Plan: Assessment and Plan Assessment & Plan Right foot pain following great toe MTP joint fusion with stable hardware Chronic pain persists post-fusion despite medication. X-ray confirms stable fusion and alignment without hardware issues. - Prescribed hydrocodone  for pain relief. - Advised wearing supportive tennis shoes. - Scheduled follow-up in two months unless symptoms change.  Right thigh contusion Contusion from impact with firmness, no significant swelling or  tenderness, and no DVT evidence. - Monitor for symptom changes or complications.      Follow-Up Instructions: Return in about 2 months (around 12/21/2024).   Ortho Exam  Patient is alert, oriented, no adenopathy, well-dressed, normal affect, normal respiratory effort. Physical Exam EXTREMITIES: Dorsalis pedis pulse present. No swelling in midfoot or forefoot. No ecchymosis or bruising. Toes congruent and aligned. No varus or valgus deformity in great toe fusion. No swelling, ulcers, or infection signs. Calf soft, non-tender, no DVT evidence. No pain with ankle dorsiflexion. Firmness in right thigh. NEUROLOGICAL: Normal gait, shuffles feet with ambulation.      Imaging: XR Foot Complete Right Result Date: 10/21/2024 Three-view radiographs of the right foot shows stable ankle fusion of the right great toe MTP joint.  The alignment is straight there is no hardware complications no backing out of the screws no broken hardware.  No images are attached to the encounter.  Labs: Lab Results  Component Value Date   HGBA1C 5.8 (A) 11/25/2023   HGBA1C 5.7 06/19/2020   HGBA1C 6.0 03/14/2020   ESRSEDRATE 20 10/23/2017   CRP 12.8 (H) 10/23/2017     Lab Results  Component Value Date   ALBUMIN 4.6 02/25/2024   ALBUMIN 4.2 03/24/2023   ALBUMIN 4.6 10/23/2017    Lab Results  Component Value Date   MG 2.5 (H) 10/23/2017   MG 2.1 08/26/2007  No results found for: VD25OH  No results found for: PREALBUMIN    Latest Ref Rng & Units 02/25/2024   10:08 AM 08/07/2022    6:30 AM 12/12/2021    9:45 AM  CBC EXTENDED  WBC 3.4 - 10.8 x10E3/uL 6.8  7.3  6.6   RBC 3.77 - 5.28 x10E6/uL 4.83  4.59  4.73   Hemoglobin 11.1 - 15.9 g/dL 87.2  87.8  87.9   HCT 34.0 - 46.6 % 40.7  37.4  37.8   Platelets 150 - 450 x10E3/uL 271  225  246      There is no height or weight on file to calculate BMI.  Orders:  Orders Placed This Encounter  Procedures   XR Foot Complete Right   Meds ordered  this encounter  Medications   HYDROcodone -acetaminophen  (NORCO/VICODIN) 5-325 MG tablet    Sig: Take 1 tablet by mouth every 12 (twelve) hours as needed.    Dispense:  20 tablet    Refill:  0     Procedures: No procedures performed  Clinical Data: No additional findings.  ROS:  All other systems negative, except as noted in the HPI. Review of Systems  Objective: Vital Signs: There were no vitals taken for this visit.  Specialty Comments:  No specialty comments available.  PMFS History: Patient Active Problem List   Diagnosis Date Noted   Prediabetes 11/25/2023   Medication management 04/15/2023   Hyperlipidemia 04/07/2023   Breast lump 03/24/2023   Encounter for annual physical exam 05/28/2022   Prurigo nodularis 12/27/2021   Nutritional counseling 07/03/2020   GERD (gastroesophageal reflux disease) 06/19/2020   Paronychia of right middle finger 05/19/2020   COPD (chronic obstructive pulmonary disease) (HCC) 12/08/2019   Loud snoring 07/01/2019   Neurotic excoriations 05/20/2019   Skin lesions, generalized 10/16/2018   Complex regional pain syndrome type 1 of lower extremity (Right) 11/10/2017   Cocaine abuse (HCC) 11/10/2017   History of tobacco abuse 11/10/2017   Osteoarthritis 11/10/2017   Chronic shoulder pain (Bilateral) (L>R) 11/10/2017   Dextroscoliosis 11/10/2017   DDD (degenerative disc disease), cervical 11/10/2017   Knee pain, chronic (Secondary Area of Pain) (right) 11/09/2017   Chronic lower extremity pain [facility] Area of Pain) (Right) 10/23/2017   Chronic pain syndrome 10/23/2017   Hallux rigidus, right foot 10/23/2017   Urinary incontinence 11/20/2016   Restrictive lung disease 07/23/2016   Essential hypertension 03/22/2016   High risk bisexual behavior 05/18/2015   CATARACTS, BILATERAL 06/24/2007   Bipolar affective disorder (HCC) 10/03/2006   Constipation 10/03/2006   Past Medical History:  Diagnosis Date   Bipolar disorder (HCC)     Depression    Drug abuse (HCC)    Hepatitis    Hypertension    Pneumothorax    Pre-diabetes     History reviewed. No pertinent family history.  Past Surgical History:  Procedure Laterality Date   ARTHRODESIS METATARSALPHALANGEAL JOINT (MTPJ) Right 08/07/2022   Procedure: FUSION RIGHT GREAT TOE METATARSOPHALANGEAL JOINT;  Surgeon: Harden Jerona GAILS, MD;  Location: [facility] OR;  Service: Orthopedics;  Laterality: Right;   CATARACT EXTRACTION Bilateral    Social History   Occupational History   Not on file  Tobacco Use   Smoking status: Former    Current packs/day: 0.00    Average packs/day: 1 pack/day for 40.2 years (40.2 ttl pk-yrs)    Types: Cigarettes    Start date: 12/09/1978    Quit date: 03/10/2019    Years since quitting: 5.6  Passive exposure: Past   Smokeless tobacco: Never   Tobacco comments:    Smoked 1-2 ppd for 40 years  QUIT 02/2019  Substance and Sexual Activity   Alcohol use: No    Comment: none in 8 years   Drug use: No    Comment: clean 8 years   Sexual activity: Not on file

## 2024-10-26 ENCOUNTER — Other Ambulatory Visit: Payer: Self-pay | Admitting: Family Medicine

## 2024-10-26 DIAGNOSIS — R21 Rash and other nonspecific skin eruption: Secondary | ICD-10-CM

## 2024-11-09 ENCOUNTER — Encounter (HOSPITAL_COMMUNITY): Payer: Self-pay

## 2024-11-09 ENCOUNTER — Ambulatory Visit (HOSPITAL_COMMUNITY): Payer: MEDICAID | Admitting: Mental Health

## 2024-11-25 ENCOUNTER — Ambulatory Visit: Payer: MEDICAID

## 2024-11-25 VITALS — BP 162/88 | HR 78 | Wt 159.0 lb

## 2024-11-25 DIAGNOSIS — L981 Factitial dermatitis: Secondary | ICD-10-CM

## 2024-11-25 DIAGNOSIS — L282 Other prurigo: Secondary | ICD-10-CM

## 2024-11-25 MED ORDER — TRIAMCINOLONE ACETONIDE 0.1 % EX CREA: 1.0000 | TOPICAL_CREAM | Freq: Two times a day (BID) | CUTANEOUS | 2 refills | Status: AC

## 2024-11-25 MED ORDER — CETIRIZINE HCL 10 MG PO TABS: 10.0000 mg | ORAL_TABLET | Freq: Every day | ORAL | 0 refills | Status: AC

## 2024-11-25 NOTE — Patient Instructions (Addendum)
 For the spots on your legs this looks to be related to chronic inflammation caused by an allergy.   We recommend taking zyrtec  daily and applying a topical steroid cream. Please avoid scratching the area as this can worsen your symptoms.

## 2024-11-25 NOTE — Assessment & Plan Note (Signed)
 Will treat for chronic urticaria with daily Zyrtec  10 mg and topical steroid cream.  Patient counseled on not scratching the lesions  Return if no improvement

## 2024-11-25 NOTE — Progress Notes (Signed)
° ° °  SUBJECTIVE:   CHIEF COMPLAINT / HPI:   Rhonda Dominguez is a 63 y.o. female presenting for multiple skin nodules on her bilateral legs, arms and a few on her stomach.  - they are very itchy - she has trouble not scratching them  - she has caused some skin irritation from her scratching  - she denies new soaps, lotions or laundry detergent  - denies other people in her home with similar rash    PERTINENT  PMH / PSH: reviewed and updated.  OBJECTIVE:   BP (!) 162/88   Pulse 78   Wt 159 lb (72.1 kg)   SpO2 100%   BMI 24.90 kg/m   Well-appearing, no acute distress Pulm: No increased work of breathing  Skin: multiple papules scattered on bilateral legs, arms and stomach ranging from 0.5-1 cm in diameter, excoriations noted without signs of secondary infections         ASSESSMENT/PLAN:   Assessment & Plan Chronic papular urticaria Neurotic excoriations Will treat for chronic urticaria with daily Zyrtec  10 mg and topical steroid cream.  Patient counseled on not scratching the lesions  Return if no improvement      Damien Pinal, DO [facility]

## 2024-12-10 ENCOUNTER — Other Ambulatory Visit: Payer: Self-pay | Admitting: Family Medicine

## 2024-12-10 DIAGNOSIS — J984 Other disorders of lung: Secondary | ICD-10-CM

## 2024-12-10 DIAGNOSIS — R21 Rash and other nonspecific skin eruption: Secondary | ICD-10-CM

## 2024-12-13 ENCOUNTER — Ambulatory Visit (INDEPENDENT_AMBULATORY_CARE_PROVIDER_SITE_OTHER): Payer: MEDICAID | Admitting: Family Medicine

## 2024-12-13 VITALS — BP 128/81 | HR 82 | Wt 153.0 lb

## 2024-12-13 DIAGNOSIS — K5900 Constipation, unspecified: Secondary | ICD-10-CM | POA: Diagnosis not present

## 2024-12-13 DIAGNOSIS — W19XXXS Unspecified fall, sequela: Secondary | ICD-10-CM

## 2024-12-13 DIAGNOSIS — R32 Unspecified urinary incontinence: Secondary | ICD-10-CM | POA: Diagnosis not present

## 2024-12-13 DIAGNOSIS — E785 Hyperlipidemia, unspecified: Secondary | ICD-10-CM

## 2024-12-13 MED ORDER — SENNA 8.6 MG PO TABS: 1.0000 | ORAL_TABLET | Freq: Every day | ORAL | 0 refills | Status: AC | PRN

## 2024-12-13 MED ORDER — POLYETHYLENE GLYCOL 3350 17 GM/SCOOP PO POWD: 17.0000 g | Freq: Two times a day (BID) | ORAL | 1 refills | Status: AC | PRN

## 2024-12-13 MED ORDER — ATORVASTATIN CALCIUM 10 MG PO TABS: 10.0000 mg | ORAL_TABLET | Freq: Every day | ORAL | 3 refills | Status: AC

## 2024-12-13 NOTE — Progress Notes (Signed)
" ° ° °  SUBJECTIVE:   CHIEF COMPLAINT / HPI: HTN f/u  Seen 12/18 in derm clinic for chronic papular urticaria and excoriations At that time BP was elevated but pt noted to not have taken BP meds prior to visit She is on amlodipine  10mg  daily and hydrochlorothiazide  25mg  daily  Discussed the use of AI scribe software for clinical note transcription with the patient, who gave verbal consent to proceed.  History of Present Illness JANAYA BROY is a 64 year old female who presents for medication refills and management of constipation.  Chronic constipation - Chronic infrequent, difficult bowel movements - No improvement with Metamucil or Miralax  - Previously responded well to Senna and requests it be restarted  Urinary incontinence - Urinary incontinence for approximately ten years - Uses absorbent products for management - Declines pharmacologic therapy  Mobility impairment and recent fall - Fell three weeks ago without loss of consciousness or head injury - Fall occurred when not using a cane, which she lost - Requests prescription for a new cane to assist with mobility  Tobacco use - Quit smoking three years ago     PERTINENT  PMH / PSH: HTN, COPD  OBJECTIVE:   BP 128/81   Pulse 82   Wt 153 lb (69.4 kg)   SpO2 96%   BMI 23.96 kg/m    General: NAD, pleasant, able to participate in exam Respiratory: No respiratory distress Abd: soft NTND Skin: warm and dry, no rashes noted Psych: Normal affect and mood  ASSESSMENT/PLAN:     Assessment & Plan Fall, sequela Recent fall without loss of consciousness or head injury. Uses a cane but lost it.  - Prescribed a new cane. - Ensure follow-up with ortho on January 15th. Constipation, unspecified constipation type Chronic constipation unresponsive to Metamucil, Miralax . Dietary habits include junk food, but weight management improved with nutritionist. - Prescribed Senna tablets. - Prescribed Miralax . - Advised  combined use of Senna and Miralax . Urinary incontinence, unspecified type Chronic urinary incontinence managed with pull-ups. No medication desired. - Continue use of pull-ups. Ordered DME   Payton Coward, MD [facility] "

## 2024-12-13 NOTE — Assessment & Plan Note (Signed)
 Chronic urinary incontinence managed with pull-ups. No medication desired. - Continue use of pull-ups. Ordered DME

## 2024-12-13 NOTE — Patient Instructions (Signed)
 I have ordered your cane and pull ups  Take senna and miralax  for constipation

## 2024-12-13 NOTE — Assessment & Plan Note (Signed)
 Chronic constipation unresponsive to Metamucil, Miralax . Dietary habits include junk food, but weight management improved with nutritionist. - Prescribed Senna tablets. - Prescribed Miralax . - Advised combined use of Senna and Miralax .

## 2024-12-14 ENCOUNTER — Telehealth: Payer: Self-pay

## 2024-12-14 NOTE — Telephone Encounter (Signed)
 DME confirmed and received by [employer].   Patient updated.

## 2024-12-14 NOTE — Telephone Encounter (Signed)
 Patient calls nurse line in regards to cane and incontinence supplies.  She reports she spoke with [employer] and they received order for incontinence supplies, however no order for DME cane.  Advised will check with [employer] for DME cane coverage. Community message sent.   Advised I will call her with updates.

## 2024-12-20 ENCOUNTER — Ambulatory Visit: Payer: MEDICAID | Admitting: Pharmacist

## 2024-12-21 ENCOUNTER — Telehealth (HOSPITAL_COMMUNITY): Payer: Self-pay

## 2024-12-21 NOTE — Telephone Encounter (Signed)
 Patient called irately and requesting to speak with a supervisor. Patient Access team forwarded the call to Operations Manager, Sean Macwilliams to speak with patient. Patient then proceeded to yell and stated that everyone is lying to her and she is tired of the lies. She asked to speak to someone above Camya Haydon. Sakai Wolford proceeded to tell patient that she is Management and is just trying to get information on why she was upset, what she felt she was being lied to about with the intent of helping resolve the situation. The patient then stated that she has called in the past and was told there were no supervisors at the office. I assured her that leadership is responsible for 5 clinics and there are times that leadership may be at another site. She then stated that there had to be someone there if there was a crisis. Markella Dao shared that she was able to go to the 1st floor  Urgent Care for Crisis if needed. She then asked about the urgent care and how it works. Cameo Shewell provided all information. We then circled back to the reason for her call and she stated that she wanted to schedule an appointment with her therapist. She already had an appt on 1/30 with Erika. When she was told it was virtual, patient stated that she no longer wanted to do virtual and wanted to see Bernice in person. I offered an appt on 1/14 but patient stated that she did not have transportation. She was then advised that the next available in person appt would be in March. The patient said that was fine and took an appt in March. She was also offered to keep her 1/30 appt in addition to the appt in March, and she declined. She also wanted to see her Med Mgmt provider, Eddie. Pt has not seen him in about a year. Pt was offered a New Patient Appt in February. Pt was then more pleasant and thanked Wenona Mayville for her time and listening to her. She voiced that she just needs to be heard sometimes and that she is a very excited person.  Scarlettrose Costilow asked that everyone start fresh and when she arrives to her appt that she assume the best of intentions and Leadership will work with patient access staff and their responses. She agreed and and was thankful. Patient asked if Tashonna Descoteaux could be present when she arrives for her appt so she could meet her. Eugenio Dollins agreed and said she will make her best effort to be there at one of her next appointments.

## 2024-12-23 ENCOUNTER — Encounter: Payer: Self-pay | Admitting: Orthopedic Surgery

## 2024-12-23 ENCOUNTER — Ambulatory Visit: Payer: MEDICAID | Admitting: Orthopedic Surgery

## 2024-12-23 DIAGNOSIS — M79671 Pain in right foot: Secondary | ICD-10-CM | POA: Diagnosis not present

## 2024-12-23 DIAGNOSIS — B351 Tinea unguium: Secondary | ICD-10-CM | POA: Diagnosis not present

## 2024-12-23 DIAGNOSIS — M205X1 Other deformities of toe(s) (acquired), right foot: Secondary | ICD-10-CM | POA: Diagnosis not present

## 2024-12-23 DIAGNOSIS — M2021 Hallux rigidus, right foot: Secondary | ICD-10-CM | POA: Diagnosis not present

## 2024-12-23 MED ORDER — HYDROCODONE-ACETAMINOPHEN 5-325 MG PO TABS: 1.0000 | ORAL_TABLET | Freq: Two times a day (BID) | ORAL | 0 refills | Status: AC | PRN

## 2024-12-27 ENCOUNTER — Encounter: Payer: Self-pay | Admitting: Orthopedic Surgery

## 2024-12-27 NOTE — Progress Notes (Signed)
 "  Office Visit Note   Patient: Rhonda Dominguez           Date of Birth: 28-Feb-1961           MRN: [id number] Visit Date: 12/23/2024              Requested by: Romelle Booty, MD [address] [place],  [place] [address] PCP: Romelle Booty, MD  Chief Complaint  Patient presents with   Right Leg - Follow-up   Right Foot - Follow-up      HPI: Discussed the use of AI scribe software for clinical note transcription with the patient, who gave verbal consent to proceed.  History of Present Illness Rhonda Dominguez is a 64 year old female with chronic foot pain and onychomycosis who presents for follow-up of foot pain.  She has persistent foot pain radiating up her leg, with ongoing discomfort this week and pain worsened by ambulation. She uses hydrocodone  for pain control, typically two pills per day, with ten days of medication remaining. She inquires about the need for neurologic evaluation due to radiating pain and recent falls.  She has experienced multiple falls recently, including two falls this week and a fall at the gym while using exercise equipment. She attributes her falls to difficulty with ambulation and attempts to walk daily. She uses a cane, provided by her primary care provider, and reports consistent use, especially during appointments. She receives daily assistance from a nurse, who will be staying with her more frequently, and her son is also involved in her care.  She has thickened, hard, and discolored toenails affecting all ten toes, which she is unable to trim herself. She maintains foot hygiene with baking soda and nail polish and expresses concern about the appearance of her feet, particularly for the summer. She notes one toe is beginning to turn downward and is concerned about possible infection. She uses special shoes and reports persistent foot swelling despite these interventions.     Assessment & Plan: Visit Diagnoses:  1. Pain in right foot   2. Hallux  rigidus, right foot   3. Onychomycosis   4. Claw toe, acquired, right     Plan: Assessment and Plan Assessment & Plan Onychomycosis Chronic onychomycosis affecting all toenails with thickening and discoloration. No plantar ulcers, calluses, or infection. Age-related changes may contribute to toe deformity. - Trimmed all toenails during visit. - Recommended pumice stone for dry skin, advised purchase from [employer]. - Scheduled follow-up in three months for nail care.  Chronic foot pain Chronic foot pain with intermittent exacerbations and functional limitations. Neurology referral not indicated. - Prescribed Vicodin (hydrocodone ) for analgesia. - Scheduled follow-up in three months for reevaluation.  History of falls Recent multiple falls, ambulates with a cane, receives daily nursing support. Neurology referral not indicated. - Reinforced use of cane at all times. - Scheduled follow-up in three months for ongoing assessment.      Follow-Up Instructions: No follow-ups on file.   Ortho Exam  Patient is alert, oriented, no adenopathy, well-dressed, normal affect, normal respiratory effort. Physical Exam EXTREMITIES: Feet normal. Thick and discolored onychomycotic nails x10. Pulses good. No plantar ulcers or calluses. MUSCULOSKELETAL: Gait normal.    Nails trimmed x 9 without complaint.  Nontender no evidence of DVT.  Imaging: No results found. No images are attached to the encounter.  Labs: Lab Results  Component Value Date   HGBA1C 5.8 (A) 11/25/2023   HGBA1C 5.7 06/19/2020   HGBA1C 6.0 03/14/2020  ESRSEDRATE 20 10/23/2017   CRP 12.8 (H) 10/23/2017     Lab Results  Component Value Date   ALBUMIN 4.6 02/25/2024   ALBUMIN 4.2 03/24/2023   ALBUMIN 4.6 10/23/2017    Lab Results  Component Value Date   MG 2.5 (H) 10/23/2017   MG 2.1 08/26/2007   No results found for: VD25OH  No results found for: PREALBUMIN    Latest Ref Rng & Units 02/25/2024   10:08  AM 08/07/2022    6:30 AM 12/12/2021    9:45 AM  CBC EXTENDED  WBC 3.4 - 10.8 x10E3/uL 6.8  7.3  6.6   RBC 3.77 - 5.28 x10E6/uL 4.83  4.59  4.73   Hemoglobin 11.1 - 15.9 g/dL 87.2  87.8  87.9   HCT 34.0 - 46.6 % 40.7  37.4  37.8   Platelets 150 - 450 x10E3/uL 271  225  246      There is no height or weight on file to calculate BMI.  Orders:  No orders of the defined types were placed in this encounter.  Meds ordered this encounter  Medications   HYDROcodone -acetaminophen  (NORCO/VICODIN) 5-325 MG tablet    Sig: Take 1 tablet by mouth every 12 (twelve) hours as needed.    Dispense:  20 tablet    Refill:  0     Procedures: No procedures performed  Clinical Data: No additional findings.  ROS:  All other systems negative, except as noted in the HPI. Review of Systems  Objective: Vital Signs: There were no vitals taken for this visit.  Specialty Comments:  No specialty comments available.  PMFS History: Patient Active Problem List   Diagnosis Date Noted   Prediabetes 11/25/2023   Medication management 04/15/2023   Hyperlipidemia 04/07/2023   Breast lump 03/24/2023   Encounter for annual physical exam 05/28/2022   Prurigo nodularis 12/27/2021   Nutritional counseling 07/03/2020   GERD (gastroesophageal reflux disease) 06/19/2020   Paronychia of right middle finger 05/19/2020   COPD (chronic obstructive pulmonary disease) (HCC) 12/08/2019   Loud snoring 07/01/2019   Neurotic excoriations 05/20/2019   Skin lesions, generalized 10/16/2018   Complex regional pain syndrome type 1 of lower extremity (Right) 11/10/2017   Cocaine abuse (HCC) 11/10/2017   History of tobacco abuse 11/10/2017   Osteoarthritis 11/10/2017   Chronic shoulder pain (Bilateral) (L>R) 11/10/2017   Dextroscoliosis 11/10/2017   DDD (degenerative disc disease), cervical 11/10/2017   Knee pain, chronic (Secondary Area of Pain) (right) 11/09/2017   Chronic lower extremity pain [facility] Area of Pain)  (Right) 10/23/2017   Chronic pain syndrome 10/23/2017   Hallux rigidus, right foot 10/23/2017   Urinary incontinence 11/20/2016   Restrictive lung disease 07/23/2016   Essential hypertension 03/22/2016   High risk bisexual behavior 05/18/2015   CATARACTS, BILATERAL 06/24/2007   Bipolar affective disorder (HCC) 10/03/2006   Constipation 10/03/2006   Past Medical History:  Diagnosis Date   Bipolar disorder (HCC)    Depression    Drug abuse (HCC)    Hepatitis    Hypertension    Pneumothorax    Pre-diabetes     History reviewed. No pertinent family history.  Past Surgical History:  Procedure Laterality Date   ARTHRODESIS METATARSALPHALANGEAL JOINT (MTPJ) Right 08/07/2022   Procedure: FUSION RIGHT GREAT TOE METATARSOPHALANGEAL JOINT;  Surgeon: Harden Jerona GAILS, MD;  Location: [facility] OR;  Service: Orthopedics;  Laterality: Right;   CATARACT EXTRACTION Bilateral    Social History   Occupational History   Not on file  Tobacco Use   Smoking status: Former    Current packs/day: 0.00    Average packs/day: 1 pack/day for 40.2 years (40.2 ttl pk-yrs)    Types: Cigarettes    Start date: 12/09/1978    Quit date: 03/10/2019    Years since quitting: 5.8    Passive exposure: Past   Smokeless tobacco: Never   Tobacco comments:    Smoked 1-2 ppd for 40 years  QUIT 02/2019  Substance and Sexual Activity   Alcohol use: No    Comment: none in 8 years   Drug use: No    Comment: clean 8 years   Sexual activity: Not on file         "

## 2025-01-03 ENCOUNTER — Telehealth (HOSPITAL_COMMUNITY): Payer: Self-pay | Admitting: Mental Health

## 2025-01-03 NOTE — Telephone Encounter (Signed)
 Therapist contacted pt for return call, as reported by front desk. Pt inquired about times and dates of upcoming appointments and address.  Therapist provided requested information

## 2025-01-04 LAB — OPHTHALMOLOGY REPORT-SCANNED

## 2025-01-07 ENCOUNTER — Ambulatory Visit (HOSPITAL_COMMUNITY): Payer: MEDICAID | Admitting: Mental Health

## 2025-01-13 ENCOUNTER — Ambulatory Visit (HOSPITAL_COMMUNITY): Payer: MEDICAID | Admitting: Physician Assistant

## 2025-02-09 ENCOUNTER — Ambulatory Visit (HOSPITAL_COMMUNITY): Payer: MEDICAID | Admitting: Mental Health

## 2025-03-17 ENCOUNTER — Ambulatory Visit: Payer: MEDICAID | Admitting: Orthopedic Surgery
